# Patient Record
Sex: Male | Born: 1950
Health system: Southern US, Community
[De-identification: ages and names within clinical notes are randomized; demographics above are authoritative.]

## PROBLEM LIST (undated history)

## (undated) DIAGNOSIS — K2960 Other gastritis without bleeding: Secondary | ICD-10-CM

## (undated) DIAGNOSIS — F419 Anxiety disorder, unspecified: Secondary | ICD-10-CM

## (undated) DIAGNOSIS — F909 Attention-deficit hyperactivity disorder, unspecified type: Secondary | ICD-10-CM

## (undated) DIAGNOSIS — H9190 Unspecified hearing loss, unspecified ear: Secondary | ICD-10-CM

## (undated) DIAGNOSIS — I10 Essential (primary) hypertension: Secondary | ICD-10-CM

## (undated) DIAGNOSIS — Z8669 Personal history of other diseases of the nervous system and sense organs: Secondary | ICD-10-CM

## (undated) DIAGNOSIS — K219 Gastro-esophageal reflux disease without esophagitis: Secondary | ICD-10-CM

## (undated) DIAGNOSIS — N281 Cyst of kidney, acquired: Secondary | ICD-10-CM

## (undated) DIAGNOSIS — F32A Depression, unspecified: Secondary | ICD-10-CM

## (undated) DIAGNOSIS — M545 Low back pain, unspecified: Secondary | ICD-10-CM

## (undated) DIAGNOSIS — F329 Major depressive disorder, single episode, unspecified: Secondary | ICD-10-CM

## (undated) DIAGNOSIS — H409 Unspecified glaucoma: Secondary | ICD-10-CM

## (undated) DIAGNOSIS — N400 Enlarged prostate without lower urinary tract symptoms: Secondary | ICD-10-CM

## (undated) DIAGNOSIS — E785 Hyperlipidemia, unspecified: Secondary | ICD-10-CM

## (undated) DIAGNOSIS — G8929 Other chronic pain: Secondary | ICD-10-CM

## (undated) HISTORY — DX: Depression, unspecified: F32.A

## (undated) HISTORY — DX: Anxiety disorder, unspecified: F41.9

## (undated) HISTORY — DX: Cyst of kidney, acquired: N28.1

## (undated) HISTORY — DX: Gastro-esophageal reflux disease without esophagitis: K21.9

## (undated) HISTORY — DX: Low back pain, unspecified: M54.50

## (undated) HISTORY — DX: Unspecified glaucoma: H40.9

## (undated) HISTORY — DX: Hyperlipidemia, unspecified: E78.5

## (undated) HISTORY — DX: Essential (primary) hypertension: I10

## (undated) HISTORY — DX: Major depressive disorder, single episode, unspecified: F32.9

## (undated) HISTORY — PX: WISDOM TOOTH EXTRACTION: SHX21

## (undated) HISTORY — DX: Low back pain: M54.5

## (undated) HISTORY — DX: Other chronic pain: G89.29

## (undated) HISTORY — DX: Benign prostatic hyperplasia without lower urinary tract symptoms: N40.0

## (undated) HISTORY — DX: Attention-deficit hyperactivity disorder, unspecified type: F90.9

## (undated) HISTORY — DX: Personal history of other diseases of the nervous system and sense organs: Z86.69

---
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
## 1988-12-07 HISTORY — PX: HYDROCELE EXCISION: SHX482

## 2002-01-06 ENCOUNTER — Emergency Department (HOSPITAL_COMMUNITY): Admission: EM | Admit: 2002-01-06 | Discharge: 2002-01-06 | Payer: Self-pay | Admitting: Emergency Medicine

## 2002-01-08 ENCOUNTER — Emergency Department (HOSPITAL_COMMUNITY): Admission: EM | Admit: 2002-01-08 | Discharge: 2002-01-08 | Payer: Self-pay | Admitting: Emergency Medicine

## 2002-01-15 ENCOUNTER — Emergency Department (HOSPITAL_COMMUNITY): Admission: EM | Admit: 2002-01-15 | Discharge: 2002-01-15 | Payer: Self-pay | Admitting: Emergency Medicine

## 2002-02-02 ENCOUNTER — Ambulatory Visit (HOSPITAL_BASED_OUTPATIENT_CLINIC_OR_DEPARTMENT_OTHER): Admission: RE | Admit: 2002-02-02 | Discharge: 2002-02-02 | Payer: Self-pay | Admitting: Urology

## 2002-02-02 ENCOUNTER — Encounter (INDEPENDENT_AMBULATORY_CARE_PROVIDER_SITE_OTHER): Payer: Self-pay | Admitting: *Deleted

## 2002-10-19 ENCOUNTER — Emergency Department (HOSPITAL_COMMUNITY): Admission: EM | Admit: 2002-10-19 | Discharge: 2002-10-19 | Payer: Self-pay | Admitting: *Deleted

## 2004-11-04 ENCOUNTER — Ambulatory Visit: Payer: Self-pay | Admitting: Internal Medicine

## 2004-11-13 ENCOUNTER — Ambulatory Visit: Payer: Self-pay | Admitting: Internal Medicine

## 2004-12-03 ENCOUNTER — Ambulatory Visit: Payer: Self-pay | Admitting: Psychology

## 2004-12-16 ENCOUNTER — Ambulatory Visit: Payer: Self-pay | Admitting: Psychology

## 2005-01-15 ENCOUNTER — Ambulatory Visit: Payer: Self-pay | Admitting: Internal Medicine

## 2005-01-22 ENCOUNTER — Ambulatory Visit: Payer: Self-pay | Admitting: Internal Medicine

## 2005-03-23 ENCOUNTER — Ambulatory Visit: Payer: Self-pay | Admitting: Internal Medicine

## 2005-05-11 ENCOUNTER — Ambulatory Visit: Payer: Self-pay | Admitting: Internal Medicine

## 2005-07-09 ENCOUNTER — Ambulatory Visit: Payer: Self-pay | Admitting: Internal Medicine

## 2005-07-16 ENCOUNTER — Ambulatory Visit: Payer: Self-pay | Admitting: Internal Medicine

## 2005-08-03 ENCOUNTER — Ambulatory Visit: Payer: Self-pay | Admitting: Internal Medicine

## 2005-08-13 ENCOUNTER — Ambulatory Visit: Payer: Self-pay | Admitting: Internal Medicine

## 2005-08-20 ENCOUNTER — Ambulatory Visit: Payer: Self-pay | Admitting: Internal Medicine

## 2005-09-17 ENCOUNTER — Ambulatory Visit: Payer: Self-pay | Admitting: Internal Medicine

## 2005-11-17 ENCOUNTER — Ambulatory Visit: Payer: Self-pay | Admitting: Internal Medicine

## 2005-12-29 ENCOUNTER — Ambulatory Visit: Payer: Self-pay | Admitting: Internal Medicine

## 2006-01-29 ENCOUNTER — Ambulatory Visit: Payer: Self-pay | Admitting: Internal Medicine

## 2006-03-01 ENCOUNTER — Ambulatory Visit: Payer: Self-pay | Admitting: Internal Medicine

## 2006-04-01 ENCOUNTER — Ambulatory Visit: Payer: Self-pay | Admitting: Internal Medicine

## 2006-04-29 ENCOUNTER — Ambulatory Visit: Payer: Self-pay | Admitting: Internal Medicine

## 2006-07-05 ENCOUNTER — Ambulatory Visit: Payer: Self-pay | Admitting: Internal Medicine

## 2006-08-12 ENCOUNTER — Ambulatory Visit: Payer: Self-pay | Admitting: Internal Medicine

## 2006-08-17 ENCOUNTER — Ambulatory Visit: Payer: Self-pay | Admitting: Internal Medicine

## 2006-08-17 ENCOUNTER — Encounter: Admission: RE | Admit: 2006-08-17 | Discharge: 2006-08-17 | Payer: Self-pay | Admitting: Internal Medicine

## 2006-08-17 IMAGING — US US SCROTUM
1 series · 13 of 25 positions shown · non-contrast
Comparison: None.

CLINICAL DATA: Question testicular cyst.  History of left orchiectomy for hydrocele at 18 years old.  History of enlarged prostate with infection.  On pain medications.   Right hip, leg, pelvis, and testicular pain.  History of surgery to right scrotum with hydrocele removed.  
 SCROTAL ULTRASOUND:
TECHNIQUE: Complete ultrasound examination of the testicles, epididymis, and other scrotal structures was performed.

[Series 1: us scrotum · 0.08mm/px · 13 of 34 slices shown]
[im 1/34]
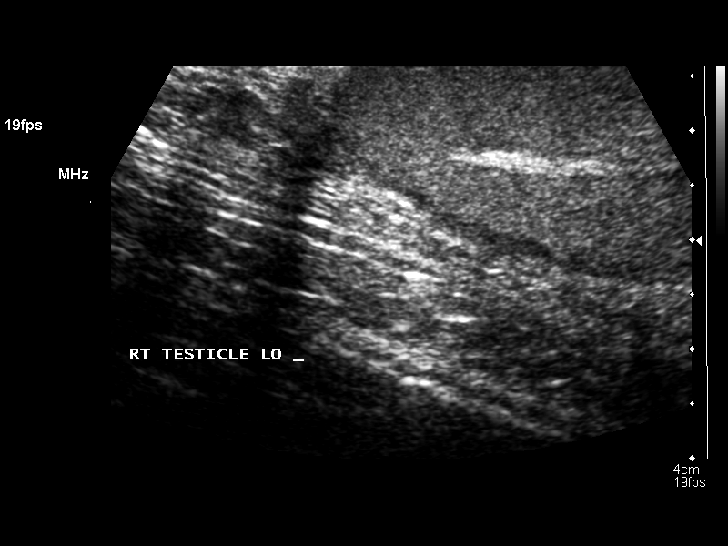
[im 3/34]
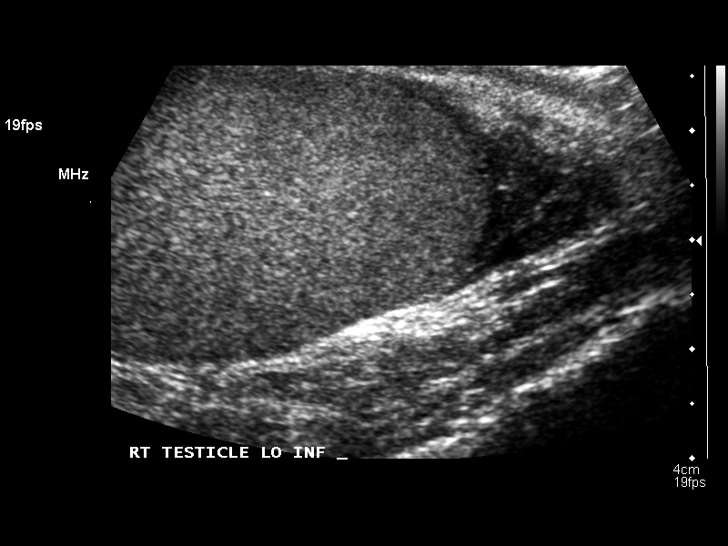
[im 6/34]
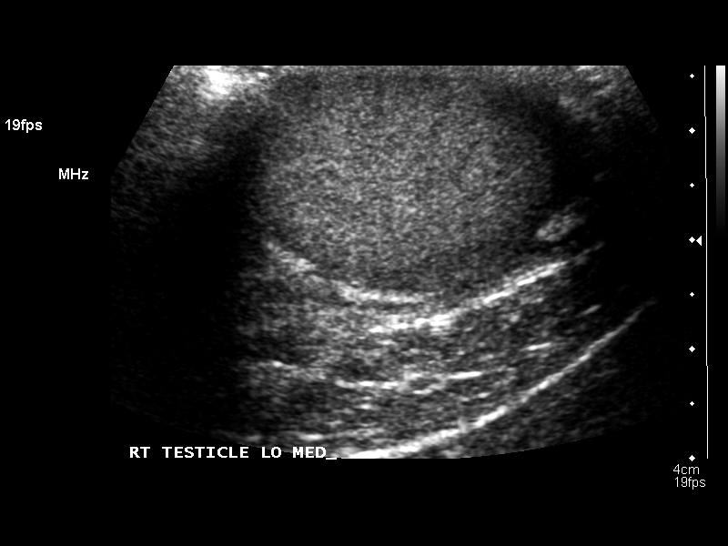
[im 9/34]
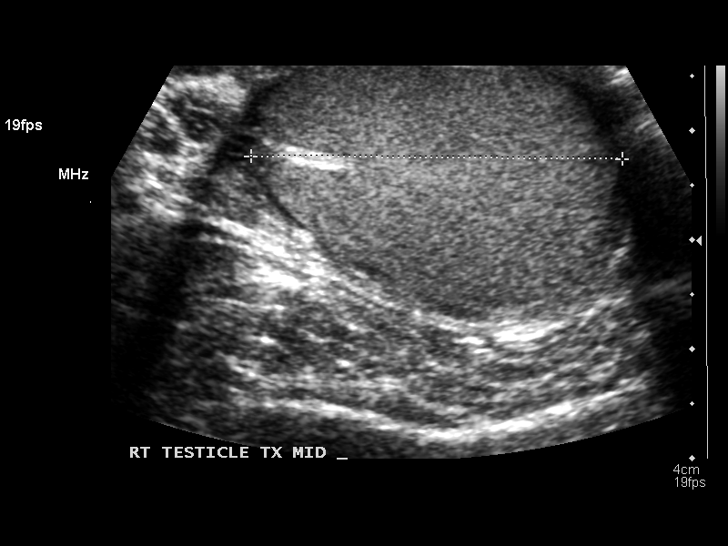
[im 12/34]
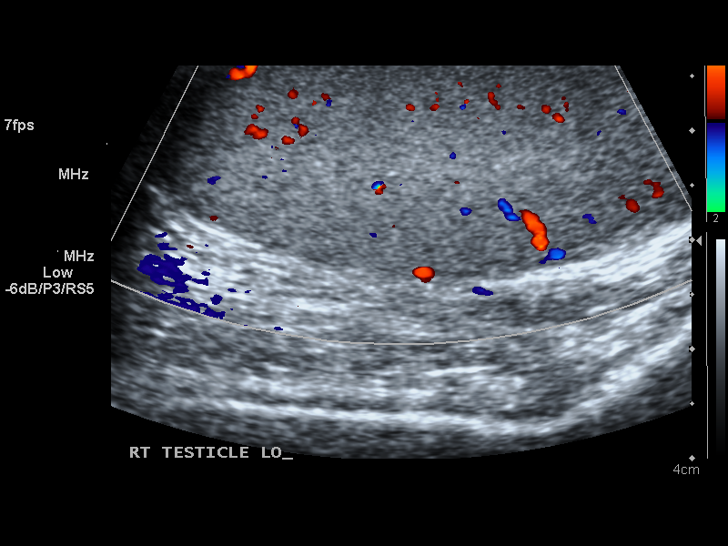
[im 14/34]
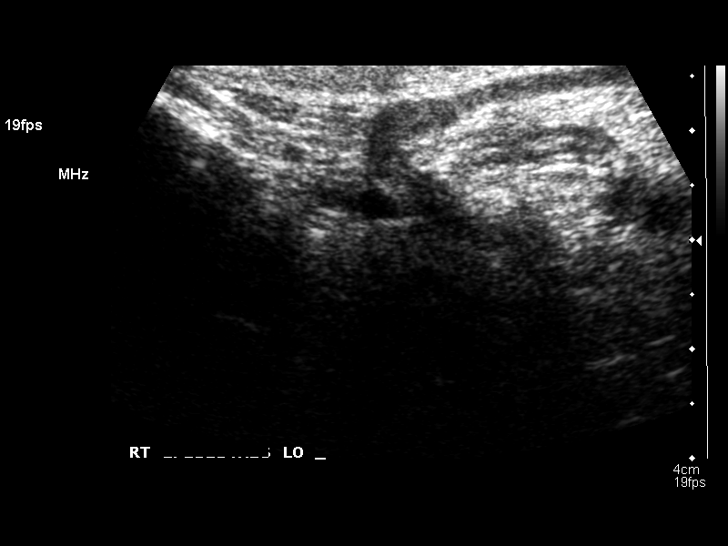
[im 17/34]
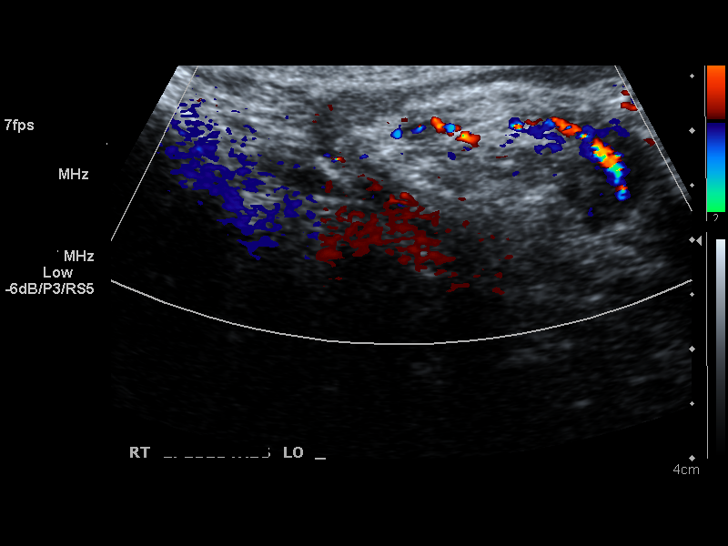
[im 20/34]
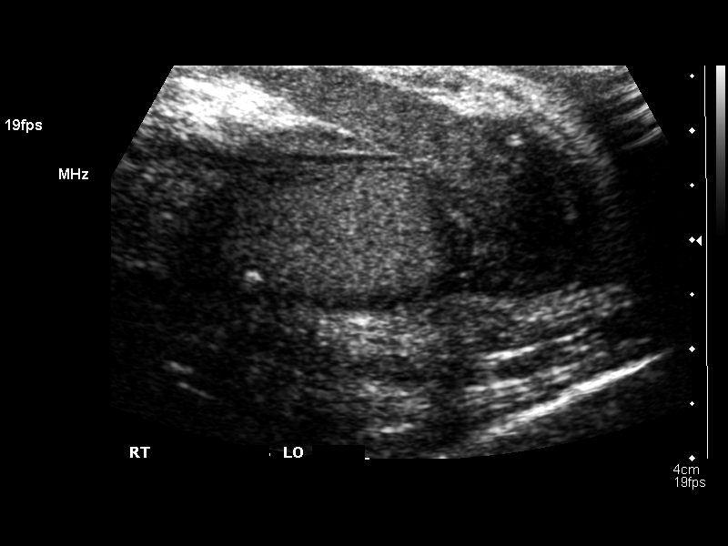
[im 23/34]
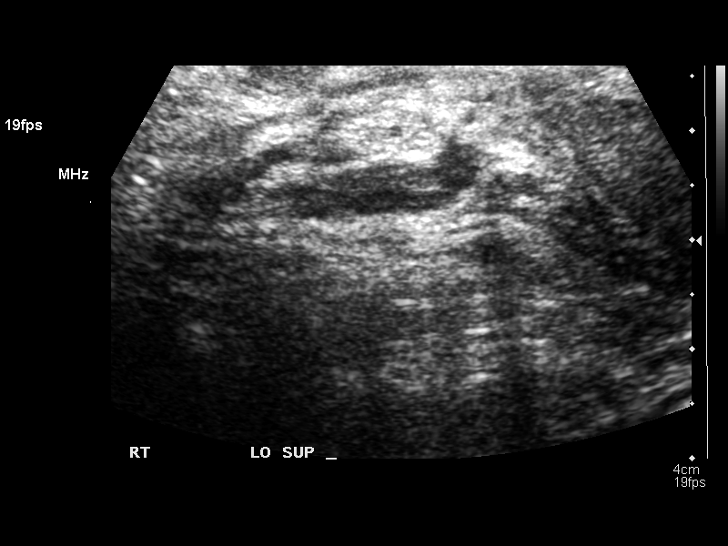
[im 25/34]
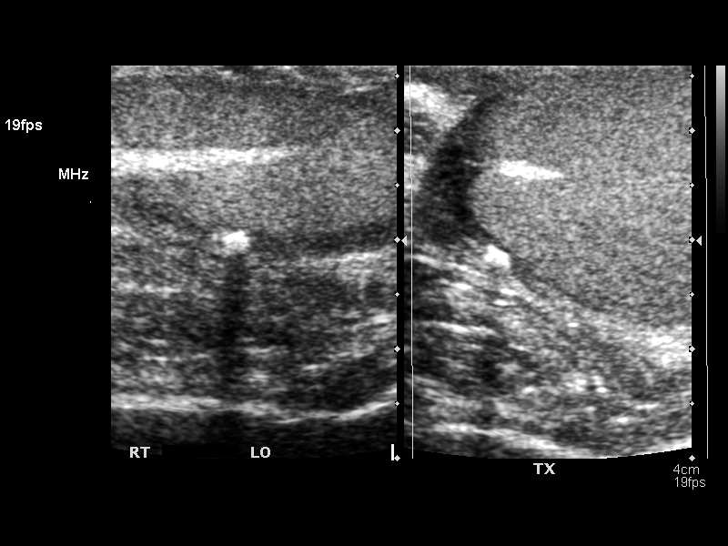
[im 28/34]
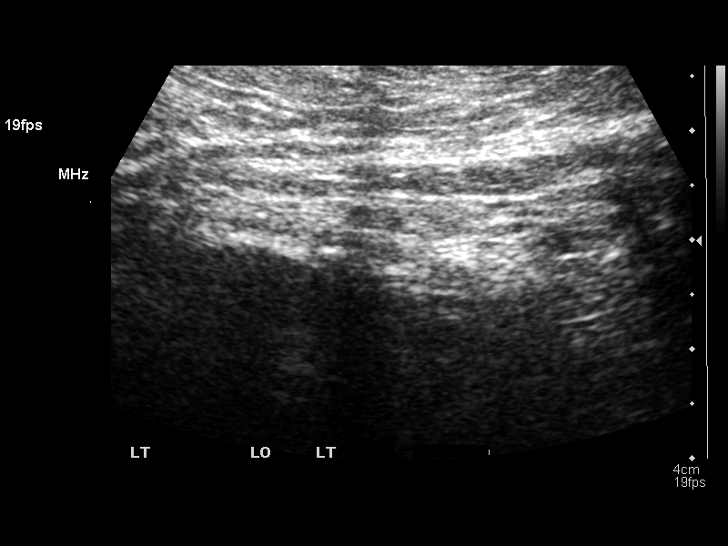
[im 31/34]
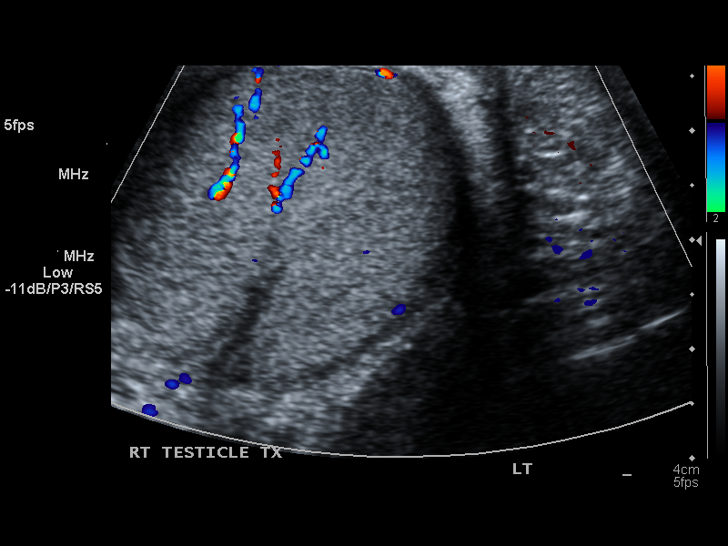
[im 34/34]
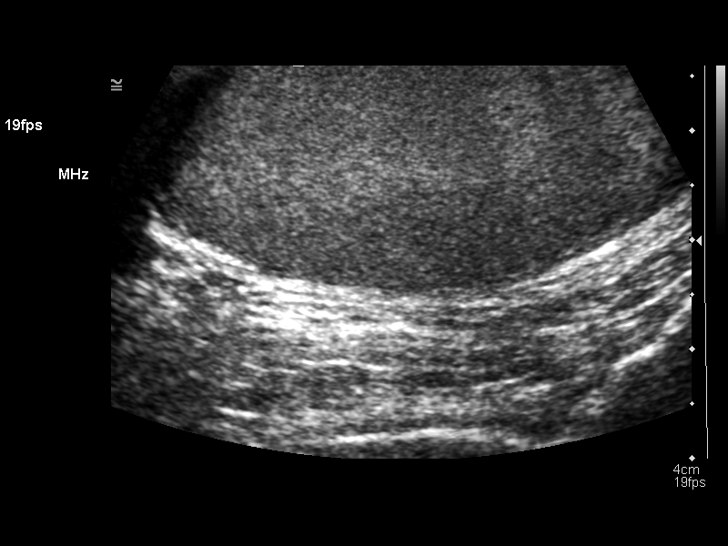

[13 of 25 positions shown; findings below may reference images not displayed]

FINDINGS: Patient is status-post left orchiectomy.  The right testicle measures 5.1 x 2.1 x 3.4 cm.  Homogeneous echotexture and normal vascularity to the right testis. 
 The right epididymis demonstrates mild fullness particularly in its tail region.  There is mild increased Doppler flow in this region.  There is possible right scrotal calcification which could relate to a ?scrotal pearl?.  
 No significant hydrocele. 
 In the distal aspect of the right inguinal canal, there is slight prominence of vessels, likely related to the spermatic cord.
IMPRESSION: 1.  Findings suspicious for mild right-sided epididymitis.  
 2.  Normal appearance of the right testis. 
 3.  Possible small right scrotal pearl. 
 4.  Status-post left orchiectomy.

## 2006-09-22 ENCOUNTER — Ambulatory Visit: Payer: Self-pay | Admitting: Internal Medicine

## 2006-10-25 ENCOUNTER — Ambulatory Visit: Payer: Self-pay | Admitting: Internal Medicine

## 2006-11-12 ENCOUNTER — Ambulatory Visit: Payer: Self-pay | Admitting: Internal Medicine

## 2006-12-29 ENCOUNTER — Ambulatory Visit: Payer: Self-pay | Admitting: Internal Medicine

## 2007-03-04 ENCOUNTER — Ambulatory Visit: Payer: Self-pay | Admitting: Internal Medicine

## 2007-05-13 ENCOUNTER — Ambulatory Visit: Payer: Self-pay | Admitting: Internal Medicine

## 2007-05-13 DIAGNOSIS — N401 Enlarged prostate with lower urinary tract symptoms: Secondary | ICD-10-CM | POA: Insufficient documentation

## 2007-05-13 DIAGNOSIS — I10 Essential (primary) hypertension: Secondary | ICD-10-CM | POA: Insufficient documentation

## 2007-05-13 DIAGNOSIS — E785 Hyperlipidemia, unspecified: Secondary | ICD-10-CM | POA: Insufficient documentation

## 2007-05-13 DIAGNOSIS — R351 Nocturia: Secondary | ICD-10-CM

## 2007-06-14 ENCOUNTER — Encounter: Payer: Self-pay | Admitting: Internal Medicine

## 2007-06-14 ENCOUNTER — Ambulatory Visit: Payer: Self-pay | Admitting: Internal Medicine

## 2007-06-14 DIAGNOSIS — K2970 Gastritis, unspecified, without bleeding: Secondary | ICD-10-CM | POA: Insufficient documentation

## 2007-06-14 DIAGNOSIS — F988 Other specified behavioral and emotional disorders with onset usually occurring in childhood and adolescence: Secondary | ICD-10-CM

## 2007-06-14 DIAGNOSIS — F329 Major depressive disorder, single episode, unspecified: Secondary | ICD-10-CM

## 2007-06-14 DIAGNOSIS — F32A Depression, unspecified: Secondary | ICD-10-CM | POA: Insufficient documentation

## 2007-06-14 DIAGNOSIS — K2990 Gastroduodenitis, unspecified, without bleeding: Secondary | ICD-10-CM

## 2007-06-14 DIAGNOSIS — M25569 Pain in unspecified knee: Secondary | ICD-10-CM

## 2007-06-18 ENCOUNTER — Encounter: Admission: RE | Admit: 2007-06-18 | Discharge: 2007-06-18 | Payer: Self-pay | Admitting: Internal Medicine

## 2007-06-23 ENCOUNTER — Telehealth: Payer: Self-pay | Admitting: *Deleted

## 2007-07-07 ENCOUNTER — Encounter: Payer: Self-pay | Admitting: Internal Medicine

## 2007-07-15 ENCOUNTER — Ambulatory Visit: Payer: Self-pay | Admitting: Internal Medicine

## 2007-07-15 DIAGNOSIS — M5137 Other intervertebral disc degeneration, lumbosacral region: Secondary | ICD-10-CM

## 2007-07-20 ENCOUNTER — Encounter: Payer: Self-pay | Admitting: Internal Medicine

## 2007-07-20 ENCOUNTER — Ambulatory Visit: Payer: Self-pay | Admitting: Family Medicine

## 2007-07-29 ENCOUNTER — Encounter: Payer: Self-pay | Admitting: Internal Medicine

## 2007-08-15 ENCOUNTER — Telehealth: Payer: Self-pay | Admitting: Internal Medicine

## 2007-08-31 ENCOUNTER — Telehealth (INDEPENDENT_AMBULATORY_CARE_PROVIDER_SITE_OTHER): Payer: Self-pay | Admitting: *Deleted

## 2007-09-07 HISTORY — PX: COLONOSCOPY: SHX174

## 2007-09-12 ENCOUNTER — Telehealth: Payer: Self-pay | Admitting: Internal Medicine

## 2007-09-16 ENCOUNTER — Ambulatory Visit: Payer: Self-pay | Admitting: Internal Medicine

## 2007-09-16 LAB — CONVERTED CEMR LAB
AST: 22 units/L (ref 0–37)
Bilirubin, Direct: 0.1 mg/dL (ref 0.0–0.3)
CO2: 30 meq/L (ref 19–32)
Chloride: 106 meq/L (ref 96–112)
Eosinophils Absolute: 0.1 10*3/uL (ref 0.0–0.6)
Eosinophils Relative: 1.2 % (ref 0.0–5.0)
GFR calc non Af Amer: 82 mL/min
Glucose, Bld: 103 mg/dL — ABNORMAL HIGH (ref 70–99)
HCT: 39.5 % (ref 39.0–52.0)
Lymphocytes Relative: 36.9 % (ref 12.0–46.0)
MCV: 87.9 fL (ref 78.0–100.0)
Neutro Abs: 2.7 10*3/uL (ref 1.4–7.7)
Neutrophils Relative %: 48.5 % (ref 43.0–77.0)
PSA: 1 ng/mL (ref 0.10–4.00)
RBC: 4.49 M/uL (ref 4.22–5.81)
Sodium: 141 meq/L (ref 135–145)
Specific Gravity, Urine: 1.025
Total Bilirubin: 0.6 mg/dL (ref 0.3–1.2)
Total Protein: 6 g/dL (ref 6.0–8.3)
WBC Urine, dipstick: NEGATIVE
WBC: 5.5 10*3/uL (ref 4.5–10.5)

## 2007-09-19 ENCOUNTER — Ambulatory Visit: Payer: Self-pay | Admitting: Gastroenterology

## 2007-09-23 ENCOUNTER — Ambulatory Visit: Payer: Self-pay | Admitting: Internal Medicine

## 2007-09-23 LAB — CONVERTED CEMR LAB: LDL Goal: 130 mg/dL

## 2007-09-30 ENCOUNTER — Encounter: Payer: Self-pay | Admitting: Gastroenterology

## 2007-09-30 ENCOUNTER — Encounter: Payer: Self-pay | Admitting: Internal Medicine

## 2007-09-30 ENCOUNTER — Ambulatory Visit: Payer: Self-pay | Admitting: Gastroenterology

## 2007-09-30 LAB — HM COLONOSCOPY: HM Colonoscopy: ABNORMAL

## 2007-10-03 ENCOUNTER — Telehealth: Payer: Self-pay | Admitting: *Deleted

## 2007-11-25 ENCOUNTER — Ambulatory Visit: Payer: Self-pay | Admitting: Internal Medicine

## 2008-01-03 ENCOUNTER — Telehealth: Payer: Self-pay | Admitting: Internal Medicine

## 2008-01-12 ENCOUNTER — Telehealth: Payer: Self-pay | Admitting: Internal Medicine

## 2008-03-07 ENCOUNTER — Ambulatory Visit: Payer: Self-pay | Admitting: Internal Medicine

## 2008-04-02 ENCOUNTER — Telehealth: Payer: Self-pay | Admitting: Internal Medicine

## 2008-06-06 ENCOUNTER — Ambulatory Visit: Payer: Self-pay | Admitting: Internal Medicine

## 2008-09-06 ENCOUNTER — Ambulatory Visit: Payer: Self-pay | Admitting: Internal Medicine

## 2008-09-06 DIAGNOSIS — M720 Palmar fascial fibromatosis [Dupuytren]: Secondary | ICD-10-CM | POA: Insufficient documentation

## 2008-12-12 ENCOUNTER — Ambulatory Visit: Payer: Self-pay | Admitting: Internal Medicine

## 2009-01-18 ENCOUNTER — Encounter: Payer: Self-pay | Admitting: Internal Medicine

## 2009-03-28 ENCOUNTER — Ambulatory Visit: Payer: Self-pay | Admitting: Internal Medicine

## 2009-06-25 ENCOUNTER — Ambulatory Visit: Payer: Self-pay | Admitting: Internal Medicine

## 2009-09-11 ENCOUNTER — Ambulatory Visit: Payer: Self-pay | Admitting: Internal Medicine

## 2009-09-11 ENCOUNTER — Encounter: Payer: Self-pay | Admitting: Internal Medicine

## 2009-09-24 ENCOUNTER — Ambulatory Visit: Payer: Self-pay | Admitting: Internal Medicine

## 2009-09-24 DIAGNOSIS — M899 Disorder of bone, unspecified: Secondary | ICD-10-CM | POA: Insufficient documentation

## 2009-09-24 DIAGNOSIS — M949 Disorder of cartilage, unspecified: Secondary | ICD-10-CM

## 2009-11-19 ENCOUNTER — Ambulatory Visit: Payer: Self-pay | Admitting: Internal Medicine

## 2009-11-19 DIAGNOSIS — R000 Tachycardia, unspecified: Secondary | ICD-10-CM

## 2009-11-19 DIAGNOSIS — R079 Chest pain, unspecified: Secondary | ICD-10-CM | POA: Insufficient documentation

## 2009-11-25 ENCOUNTER — Telehealth (INDEPENDENT_AMBULATORY_CARE_PROVIDER_SITE_OTHER): Payer: Self-pay | Admitting: *Deleted

## 2009-11-26 ENCOUNTER — Ambulatory Visit: Payer: Self-pay | Admitting: Cardiology

## 2009-11-26 ENCOUNTER — Ambulatory Visit: Payer: Self-pay

## 2009-11-26 ENCOUNTER — Encounter (HOSPITAL_COMMUNITY): Admission: RE | Admit: 2009-11-26 | Discharge: 2009-12-06 | Payer: Self-pay | Admitting: Internal Medicine

## 2009-11-27 ENCOUNTER — Encounter: Payer: Self-pay | Admitting: Cardiology

## 2009-12-10 ENCOUNTER — Ambulatory Visit: Payer: Self-pay | Admitting: Internal Medicine

## 2009-12-10 LAB — CONVERTED CEMR LAB: Vit D, 25-Hydroxy: 46 ng/mL (ref 30–89)

## 2009-12-20 ENCOUNTER — Ambulatory Visit: Payer: Self-pay | Admitting: Internal Medicine

## 2009-12-20 DIAGNOSIS — J0110 Acute frontal sinusitis, unspecified: Secondary | ICD-10-CM | POA: Insufficient documentation

## 2010-01-20 ENCOUNTER — Ambulatory Visit: Payer: Self-pay | Admitting: Internal Medicine

## 2010-02-17 ENCOUNTER — Ambulatory Visit: Payer: Self-pay | Admitting: Internal Medicine

## 2010-02-17 DIAGNOSIS — G44009 Cluster headache syndrome, unspecified, not intractable: Secondary | ICD-10-CM | POA: Insufficient documentation

## 2010-03-17 ENCOUNTER — Ambulatory Visit: Payer: Self-pay | Admitting: Internal Medicine

## 2010-05-19 ENCOUNTER — Telehealth: Payer: Self-pay | Admitting: Internal Medicine

## 2010-06-17 ENCOUNTER — Ambulatory Visit: Payer: Self-pay | Admitting: Internal Medicine

## 2010-09-29 LAB — CONVERTED CEMR LAB
Basophils Absolute: 0 10*3/uL
Cholesterol: 148 mg/dL
Creatinine, Ser: 0.96 mg/dL
Eosinophils Relative: 2 %
HCT: 43.3 %
HDL: 49 mg/dL
Hemoglobin: 15.4 g/dL
LDL Cholesterol: 71 mg/dL

## 2010-10-10 ENCOUNTER — Ambulatory Visit: Payer: Self-pay | Admitting: Internal Medicine

## 2010-11-05 ENCOUNTER — Telehealth: Payer: Self-pay | Admitting: *Deleted

## 2010-12-12 ENCOUNTER — Ambulatory Visit
Admission: RE | Admit: 2010-12-12 | Discharge: 2010-12-12 | Payer: Self-pay | Source: Home / Self Care | Attending: Internal Medicine | Admitting: Internal Medicine

## 2011-01-04 LAB — CONVERTED CEMR LAB
Calcium, Total (PTH): 9 mg/dL (ref 8.4–10.5)
Vit D, 25-Hydroxy: 28 ng/mL — ABNORMAL LOW (ref 30–89)

## 2011-01-08 NOTE — Assessment & Plan Note (Signed)
Summary: 1 month rov/njr/pt rescd//ccm   Vital Signs:  Patient profile:   60 year old male Height:      65 inches Weight:      168 pounds BMI:     28.06 Temp:     97.9 degrees F oral Pulse rate:   88 / minute Resp:     14 per minute BP sitting:   140 / 98  (left arm)  Vitals Entered By: Willy Eddy, LPN (December 20, 2009 12:01 PM) CC: roa labs annd f/u verapamil- bop still slightly elevated with elevated pulse-pt has chart, URI symptoms   CC:  roa labs annd f/u verapamil- bop still slightly elevated with elevated pulse-pt has chart and URI symptoms.  History of Present Illness: reveiw of blood pressure reading and pulses from report of the ambulatroy blood pressure  Hypertension Follow-Up      This is a 60 year old man who presents for Hypertension follow-up.  The patient denies lightheadedness, urinary frequency, headaches, edema, impotence, rash, and fatigue.  Associated symptoms include chest pressure.  The patient denies the following associated symptoms: chest pain, exercise intolerance, dyspnea, palpitations, syncope, leg edema, and pedal edema.  Compliance with medications (by patient report) has been near 100%.  The patient reports that dietary compliance has been good.  The patient reports exercising occasionally.  Adjunctive measures currently used by the patient include relaxation.    URI Symptoms      The patient also presents with URI symptoms.  The patient reports nasal congestion, purulent nasal discharge, and earache, but denies clear nasal discharge, sore throat, dry cough, productive cough, and sick contacts.  The patient denies fever, low-grade fever (<100.5 degrees), fever of 100.5-103 degrees, fever of 103.1-104 degrees, fever to >104 degrees, stiff neck, dyspnea, wheezing, rash, vomiting, diarrhea, use of an antipyretic, and response to antipyretic.  The patient also reports itchy watery eyes and headache.  The patient denies the following risk factors for Strep  sinusitis: unilateral facial pain, unilateral nasal discharge, poor response to decongestant, double sickening, tooth pain, Strep exposure, tender adenopathy, and absence of cough.    Preventive Screening-Counseling & Management  Alcohol-Tobacco     Smoking Status: never  Problems Prior to Update: 1)  Chest Pain, Intermittent  (ICD-786.50) 2)  Unspecified Tachycardia  (ICD-785.0) 3)  Osteopenia  (ICD-733.90) 4)  Dupuytren's Contracture, Right  (ICD-728.6) 5)  Degeneration, Lumbar/lumbosacral Disc  (ICD-722.52) 6)  Preventive Health Care  (ICD-V70.0) 7)  Attention Deficit Disorder, Adult  (ICD-314.00) 8)  Depression  (ICD-311) 9)  Joint Pain, Leg  (ICD-719.46) 10)  Gastritis  (ICD-535.50) 11)  Hypertension  (ICD-401.9) 12)  Benign Prostatic Hypertrophy  (ICD-600.00) 13)  Hyperlipidemia  (ICD-272.4)  Medications Prior to Update: 1)  Aspirin 81 Mg Tbec (Aspirin) .... Take 1 Tablet By Mouth Once A Day 2)  Finasteride 5 Mg Tabs (Finasteride) .... Once Daily 3)  Krill Oil 1000 Mg Caps (Krill Oil) .... Take 300 Once Daily 4)  Zinc 15 66 Mg  Tabs (Zinc Sulfate) .... Once Daily 5)  Lamotrigine 25 Mg Tbdp (Lamotrigine) .... 3 in The Am And2 in The Afternoon 6)  Magnesium 300 Mg Caps (Magnesium) .... Once Daily 7)  Nexium 40 Mg Cpdr (Esomeprazole Magnesium) .... Once Daily 8)  Niacin 500 Mg Tabs (Niacin) .... Take 1 Once A Day 9)  Uroxatral 10 Mg Tb24 (Alfuzosin Hcl) .... Take 1 Tablet By Mouth Once A Day 10)  Vytorin 10-20 Mg Tabs (Ezetimibe-Simvastatin) .... Once Daily 11)  Methylphenidate Hcl Cr 20 Mg Cr-Tabs (Methylphenidate Hcl) .... One By Mouth Daily 12)  Sertraline Hcl 100 Mg Tabs (Sertraline Hcl) .... One By Mouth Daily  ( Generic) 13)  Folic Acid 1 Mg Tabs (Folic Acid) .Marland Kitchen.. 1 Once Daily 14)  Magnesium 300 .Marland Kitchen.. 1 Once Daily 15)  Iron 325 (65 Fe) Mg Tabs (Ferrous Sulfate) .Marland Kitchen.. 1 Once Daily 16)  Glucosamine 500 Mg Caps (Glucosamine Sulfate) .... One By Mouth Daily 17)  Vitamin D  (Ergocalciferol) 50000 Unit Caps (Ergocalciferol) .Marland Kitchen.. 1 Every Week For 3 Months 18)  Verapamil Hcl Cr 120 Mg Xr24h-Cap (Verapamil Hcl) .... One By Mouth Daily  Current Medications (verified): 1)  Aspirin 81 Mg Tbec (Aspirin) .... Take 1 Tablet By Mouth Once A Day 2)  Finasteride 5 Mg Tabs (Finasteride) .... Once Daily 3)  Krill Oil 1000 Mg Caps (Krill Oil) .... Take 300 Once Daily 4)  Zinc 15 66 Mg  Tabs (Zinc Sulfate) .... Once Daily 5)  Lamotrigine 25 Mg Tbdp (Lamotrigine) .... 3 in The Am And2 in The Afternoon 6)  Magnesium 300 Mg Caps (Magnesium) .... Once Daily 7)  Nexium 40 Mg Cpdr (Esomeprazole Magnesium) .... Once Daily 8)  Niacin 500 Mg Tabs (Niacin) .... Take 1 Once A Day 9)  Uroxatral 10 Mg Tb24 (Alfuzosin Hcl) .... Take 1 Tablet By Mouth Once A Day 10)  Vytorin 10-20 Mg Tabs (Ezetimibe-Simvastatin) .... Once Daily 11)  Methylphenidate Hcl Cr 20 Mg Cr-Tabs (Methylphenidate Hcl) .... One By Mouth Daily 12)  Sertraline Hcl 100 Mg Tabs (Sertraline Hcl) .... One By Mouth Daily  ( Generic) 13)  Folic Acid 1 Mg Tabs (Folic Acid) .Marland Kitchen.. 1 Once Daily 14)  Magnesium 300 .Marland Kitchen.. 1 Once Daily 15)  Iron 325 (65 Fe) Mg Tabs (Ferrous Sulfate) .Marland Kitchen.. 1 Once Daily 16)  Glucosamine 500 Mg Caps (Glucosamine Sulfate) .... One By Mouth Daily 17)  Vitamin D (Ergocalciferol) 50000 Unit Caps (Ergocalciferol) .Marland Kitchen.. 1 Every Week For 3 Months 18)  Verapamil Hcl Cr 240 Mg Cr-Tabs (Verapamil Hcl) .... One By Mouth Daily 19)  Sulfamethoxazole-Tmp Ds 800-160 Mg Tabs (Sulfamethoxazole-Trimethoprim) .... One By Mouth Two Times A Day Wit Food  Allergies (verified): No Known Drug Allergies  Past History:  Family History: Last updated: 11/25/2007 Family Hsitory Headaches Family History High cholesterol  Social History: Last updated: 06/14/2007 Single Never Smoked Alcohol use-yes Drug use-no Regular exercise-yes  Risk Factors: Exercise: yes (06/14/2007)  Risk Factors: Smoking Status: never  (12/20/2009)  Past medical, surgical, family and social histories (including risk factors) reviewed, and no changes noted (except as noted below).  Past Medical History: Reviewed history from 09/06/2008 and no changes required. 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 Orchiectomy  1970 L Right testical  hydroceole  Family History: Reviewed history from 11/25/2007 and no changes required. Family Hsitory Headaches Family History High cholesterol  Social History: Reviewed history from 06/14/2007 and no changes required. Single Never Smoked Alcohol use-yes Drug use-no Regular exercise-yes  Physical Exam  General:  Well-developed,well-nourished,in no acute distress; alert,appropriate and cooperative throughout examination Head:  normocephalic and atraumatic.   Eyes:  pupils equal and pupils round.   Ears:  R TM erythema and R TM bulging.   Nose:  mucosal edema and airflow obstruction.   Mouth:  posterior lymphoid hypertrophy and postnasal drip.   Neck:  No deformities, masses, or tenderness noted. Lungs:  Normal respiratory effort, chest  expands symmetrically. Lungs are clear to auscultation, no crackles or wheezes. Heart:  Normal rate and regular rhythm. S1 and S2 normal without gallop, murmur, click, rub or other extra sounds. Psych:  Oriented X3 and not anxious appearing.     Impression & Recommendations:  Problem # 1:  HYPERTENSION (ICD-401.9) Assessment Unchanged  The following medications were removed from the medication list:    Verapamil Hcl Cr 120 Mg Xr24h-cap (Verapamil hcl) ..... One by mouth daily His updated medication list for this problem includes:    Verapamil Hcl Cr 240 Mg Cr-tabs (Verapamil hcl) ..... One by mouth daily  BP today: 140/98 Prior BP: 130/80 (11/19/2009)  Prior 10 Yr Risk Heart Disease: 7 %  (09/24/2009)  Labs Reviewed: K+: 4.0 (09/16/2007) Creat: : 1.0 (09/16/2007)   Chol: 134 (09/16/2007)   HDL: 36.0 (09/16/2007)   LDL: 79 (09/16/2007)   TG: 95 (09/16/2007)  Problem # 2:  ACUTE FRONTAL SINUSITIS (ICD-461.1) Assessment: New  with OM on the right side  Instructed on treatment. Call if symptoms persist or worsen.   His updated medication list for this problem includes:    Sulfamethoxazole-tmp Ds 800-160 Mg Tabs (Sulfamethoxazole-trimethoprim) ..... One by mouth two times a day wit food  Complete Medication List: 1)  Aspirin 81 Mg Tbec (Aspirin) .... Take 1 tablet by mouth once a day 2)  Finasteride 5 Mg Tabs (Finasteride) .... Once daily 3)  Krill Oil 1000 Mg Caps (Krill oil) .... Take 300 once daily 4)  Zinc 15 66 Mg Tabs (Zinc sulfate) .... Once daily 5)  Lamotrigine 25 Mg Tbdp (Lamotrigine) .... 3 in the am and2 in the afternoon 6)  Magnesium 300 Mg Caps (Magnesium) .... Once daily 7)  Nexium 40 Mg Cpdr (Esomeprazole magnesium) .... Once daily 8)  Niacin 500 Mg Tabs (Niacin) .... Take 1 once a day 9)  Uroxatral 10 Mg Tb24 (Alfuzosin hcl) .... Take 1 tablet by mouth once a day 10)  Vytorin 10-20 Mg Tabs (Ezetimibe-simvastatin) .... Once daily 11)  Methylphenidate Hcl Cr 20 Mg Cr-tabs (Methylphenidate hcl) .... One by mouth daily 12)  Sertraline Hcl 100 Mg Tabs (Sertraline hcl) .... One by mouth daily  ( generic) 13)  Folic Acid 1 Mg Tabs (Folic acid) .Marland Kitchen.. 1 once daily 14)  Magnesium 300  .Marland Kitchen.. 1 once daily 15)  Iron 325 (65 Fe) Mg Tabs (Ferrous sulfate) .Marland Kitchen.. 1 once daily 16)  Glucosamine 500 Mg Caps (Glucosamine sulfate) .... One by mouth daily 17)  Vitamin D (ergocalciferol) 50000 Unit Caps (Ergocalciferol) .Marland Kitchen.. 1 every week for 3 months 18)  Verapamil Hcl Cr 240 Mg Cr-tabs (Verapamil hcl) .... One by mouth daily 19)  Sulfamethoxazole-tmp Ds 800-160 Mg Tabs (Sulfamethoxazole-trimethoprim) .... One by mouth two times a day wit food  Patient Instructions: 1)  Please  schedule a follow-up appointment in 4 weeks. Prescriptions: SULFAMETHOXAZOLE-TMP DS 800-160 MG TABS (SULFAMETHOXAZOLE-TRIMETHOPRIM) one by mouth two times a day wit food  #20 x 0   Entered and Authorized by:   Stacie Glaze MD   Signed by:   Stacie Glaze MD on 12/20/2009   Method used:   Electronically to        [facility]* (retail)       [address]       [place], [place]  [id number]       Ph: [phone]       Fax: [phone]   RxID:   [id number] VERAPAMIL HCL CR 240 MG CR-TABS (VERAPAMIL  HCL) one by mouth daily  #30 x 11   Entered and Authorized by:   Stacie Glaze MD   Signed by:   Stacie Glaze MD on 12/20/2009   Method used:   Electronically to        [facility]* (retail)       [address]       [place], [place]  [id number]       Ph: [phone]       Fax: [phone]   RxID:   [id number]

## 2011-01-08 NOTE — Assessment & Plan Note (Signed)
Summary: 1 month rov/njr   Vital Signs:  Patient profile:   60 year old male Height:      65 inches Weight:      168 pounds BMI:     28.06 Temp:     98.6 degrees F oral Pulse rate:   72 / minute Resp:     14 per minute BP sitting:   120 / 80  (left arm)  Vitals Entered By: Willy Eddy, LPN (March 17, 2010 3:57 PM) CC: roa, Hypertension Management   CC:  roa and Hypertension Management.  History of Present Illness: moniter cluster head ache protocol has resumed the methylphenadate generic the meds were stable  Hypertension History:      He denies headache, chest pain, palpitations, dyspnea with exertion, orthopnea, PND, peripheral edema, visual symptoms, neurologic problems, syncope, and side effects from treatment.        Positive major cardiovascular risk factors include male age 89 years old or older, hyperlipidemia, and hypertension.  Negative major cardiovascular risk factors include no history of diabetes, negative family history for ischemic heart disease, and non-tobacco-user status.        Further assessment for target organ damage reveals no history of ASHD, cardiac end-organ damage (CHF/LVH), stroke/TIA, peripheral vascular disease, renal insufficiency, or hypertensive retinopathy.     Problems Prior to Update: 1)  Headache, Cluster  (ICD-339.00) 2)  Acute Frontal Sinusitis  (ICD-461.1) 3)  Chest Pain, Intermittent  (ICD-786.50) 4)  Unspecified Tachycardia  (ICD-785.0) 5)  Osteopenia  (ICD-733.90) 6)  Dupuytren's Contracture, Right  (ICD-728.6) 7)  Degeneration, Lumbar/lumbosacral Disc  (ICD-722.52) 8)  Preventive Health Care  (ICD-V70.0) 9)  Attention Deficit Disorder, Adult  (ICD-314.00) 10)  Depression  (ICD-311) 11)  Joint Pain, Leg  (ICD-719.46) 12)  Gastritis  (ICD-535.50) 13)  Hypertension  (ICD-401.9) 14)  Benign Prostatic Hypertrophy  (ICD-600.00) 15)  Hyperlipidemia  (ICD-272.4)  Current Problems (verified): 1)  Headache, Cluster   (ICD-339.00) 2)  Acute Frontal Sinusitis  (ICD-461.1) 3)  Chest Pain, Intermittent  (ICD-786.50) 4)  Unspecified Tachycardia  (ICD-785.0) 5)  Osteopenia  (ICD-733.90) 6)  Dupuytren's Contracture, Right  (ICD-728.6) 7)  Degeneration, Lumbar/lumbosacral Disc  (ICD-722.52) 8)  Preventive Health Care  (ICD-V70.0) 9)  Attention Deficit Disorder, Adult  (ICD-314.00) 10)  Depression  (ICD-311) 11)  Joint Pain, Leg  (ICD-719.46) 12)  Gastritis  (ICD-535.50) 13)  Hypertension  (ICD-401.9) 14)  Benign Prostatic Hypertrophy  (ICD-600.00) 15)  Hyperlipidemia  (ICD-272.4)  Medications Prior to Update: 1)  Aspirin 81 Mg Tbec (Aspirin) .... Take 1 Tablet By Mouth Once A Day 2)  Finasteride 5 Mg Tabs (Finasteride) .... Once Daily 3)  Krill Oil 1000 Mg Caps (Krill Oil) .... Take 300 Once Daily 4)  Zinc 15 66 Mg  Tabs (Zinc Sulfate) .... Once Daily 5)  Lamotrigine 25 Mg Tbdp (Lamotrigine) .... 3 in The Am And2 in The Afternoon 6)  Magnesium 300 Mg Caps (Magnesium) .... Once Daily 7)  Nexium 40 Mg Cpdr (Esomeprazole Magnesium) .... Once Daily 8)  Niacin 500 Mg Tabs (Niacin) .... Take 1 Once A Day 9)  Uroxatral 10 Mg Tb24 (Alfuzosin Hcl) .... Take 1 Tablet By Mouth Once A Day 10)  Vytorin 10-20 Mg Tabs (Ezetimibe-Simvastatin) .... Once Daily 11)  Methylphenidate Hcl Cr 20 Mg Cr-Tabs (Methylphenidate Hcl) .... One By Mouth Daily 12)  Sertraline Hcl 100 Mg Tabs (Sertraline Hcl) .... One By Mouth Daily  ( Generic) 13)  Folic Acid 1 Mg Tabs (Folic Acid) .Marland KitchenMarland KitchenMarland Kitchen  1 Once Daily 14)  Magnesium 300 .Marland Kitchen.. 1 Once Daily 15)  Iron 325 (65 Fe) Mg Tabs (Ferrous Sulfate) .Marland Kitchen.. 1 Once Daily 16)  Glucosamine 500 Mg Caps (Glucosamine Sulfate) .... One By Mouth Daily 17)  Vitamin D (Ergocalciferol) 50000 Unit Caps (Ergocalciferol) .Marland Kitchen.. 1 Every Week For 3 Months 18)  Verapamil Hcl Cr 240 Mg Cr-Tabs (Verapamil Hcl) .... One By Mouth Daily 19)  Hydrochlorothiazide 12.5 Mg Caps (Hydrochlorothiazide) .... One By Mouth Daily 20)   Doxycycline Hyclate 100 Mg Caps (Doxycycline Hyclate) .... One By Mouth Two Times A Day For 30 Days 21)  Astepro 0.15 % Soln (Azelastine Hcl) .... Two Spray in Nostril in Am  Current Medications (verified): 1)  Aspirin 81 Mg Tbec (Aspirin) .... Take 1 Tablet By Mouth Once A Day 2)  Finasteride 5 Mg Tabs (Finasteride) .... Once Daily 3)  Krill Oil 1000 Mg Caps (Krill Oil) .... Take 300 Once Daily 4)  Zinc 15 66 Mg  Tabs (Zinc Sulfate) .... Once Daily 5)  Lamotrigine 25 Mg Tbdp (Lamotrigine) .... 3 in The Am And2 in The Afternoon 6)  Magnesium 300 Mg Caps (Magnesium) .... Once Daily 7)  Nexium 40 Mg Cpdr (Esomeprazole Magnesium) .... Once Daily 8)  Niacin 500 Mg Tabs (Niacin) .... Take 1 Once A Day 9)  Uroxatral 10 Mg Tb24 (Alfuzosin Hcl) .... Take 1 Tablet By Mouth Once A Day 10)  Vytorin 10-20 Mg Tabs (Ezetimibe-Simvastatin) .... Once Daily 11)  Methylphenidate Hcl Cr 20 Mg Cr-Tabs (Methylphenidate Hcl) .... One By Mouth Daily 12)  Sertraline Hcl 100 Mg Tabs (Sertraline Hcl) .... One By Mouth Daily  ( Generic) 13)  Folic Acid 1 Mg Tabs (Folic Acid) .Marland Kitchen.. 1 Once Daily 14)  Magnesium 300 .Marland Kitchen.. 1 Once Daily 15)  Iron 325 (65 Fe) Mg Tabs (Ferrous Sulfate) .Marland Kitchen.. 1 Once Daily 16)  Glucosamine 500 Mg Caps (Glucosamine Sulfate) .... One By Mouth Daily 17)  Vitamin D (Ergocalciferol) 50000 Unit Caps (Ergocalciferol) .Marland Kitchen.. 1 Every Week For 3 Months 18)  Verapamil Hcl Cr 240 Mg Cr-Tabs (Verapamil Hcl) .... One By Mouth Daily 19)  Hydrochlorothiazide 12.5 Mg Caps (Hydrochlorothiazide) .... One By Mouth Daily 20)  Astepro 0.15 % Soln (Azelastine Hcl) .... Two Spray in Nostril in Am  Allergies (verified): No Known Drug Allergies  Past History:  Family History: Last updated: 11/25/2007 Family Hsitory Headaches Family History High cholesterol  Social History: Last updated: 06/14/2007 Single Never Smoked Alcohol use-yes Drug use-no Regular exercise-yes  Risk Factors: Exercise: yes  (06/14/2007)  Risk Factors: Smoking Status: never (02/17/2010)  Past medical, surgical, family and social histories (including risk factors) reviewed, and no changes noted (except as noted below).  Past Medical History: Reviewed history from 09/06/2008 and no changes required. 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 Orchiectomy  1970 L Right testical  hydroceole  Family History: Reviewed history from 11/25/2007 and no changes required. Family Hsitory Headaches Family History High cholesterol  Social History: Reviewed history from 06/14/2007 and no changes required. Single Never Smoked Alcohol use-yes Drug use-no Regular exercise-yes  Review of Systems  The patient denies anorexia, fever, weight loss, weight gain, vision loss, decreased hearing, hoarseness, chest pain, syncope, dyspnea on exertion, peripheral edema, prolonged cough, headaches, hemoptysis, abdominal pain, melena, hematochezia, severe indigestion/heartburn, hematuria, incontinence, genital sores, muscle weakness, suspicious skin lesions, transient blindness, difficulty walking, depression, unusual weight change, abnormal bleeding, enlarged lymph nodes,  angioedema, and breast masses.    Physical Exam  Head:  normocephalic and atraumatic.   Eyes:  pupils equal and pupils round.   Ears:  R TM erythema and R TM bulging.   Nose:  mucosal edema and airflow obstruction.   Mouth:  posterior lymphoid hypertrophy and postnasal drip.   Neck:  No deformities, masses, or tenderness noted. Lungs:  Normal respiratory effort, chest expands symmetrically. Lungs are clear to auscultation, no crackles or wheezes. Heart:  Normal rate and regular rhythm. S1 and S2 normal without gallop, murmur, click, rub or other extra sounds. Abdomen:  soft and no distention.     Impression &  Recommendations:  Problem # 1:  HEADACHE, CLUSTER (ICD-339.00)  Problem # 2:  ATTENTION DEFICIT DISORDER, ADULT (ICD-314.00)  Problem # 3:  HYPERTENSION (ICD-401.9)  His updated medication list for this problem includes:    Verapamil Hcl Cr 240 Mg Cr-tabs (Verapamil hcl) ..... One by mouth daily    Hydrochlorothiazide 12.5 Mg Caps (Hydrochlorothiazide) ..... One by mouth daily  BP today: 120/80 Prior BP: 132/80 (02/17/2010)  10 Yr Risk Heart Disease: 6 % Prior 10 Yr Risk Heart Disease: 7 % (02/17/2010)  Labs Reviewed: K+: 4.0 (09/16/2007) Creat: : 1.0 (09/16/2007)   Chol: 134 (09/16/2007)   HDL: 36.0 (09/16/2007)   LDL: 79 (09/16/2007)   TG: 95 (09/16/2007)  Problem # 4:  DEPRESSION (ICD-311)  His updated medication list for this problem includes:    Sertraline Hcl 100 Mg Tabs (Sertraline hcl) ..... One by mouth daily  ( generic) and lamictal  Discussed treatment options, including trial of antidpressant medication. Will refer to behavioral health. Follow-up call in in 24-48 hours and recheck in 2 weeks, sooner as needed. Patient agrees to call if any worsening of symptoms or thoughts of doing harm arise. Verified that the patient has no suicidal ideation at this time.   Complete Medication List: 1)  Aspirin 81 Mg Tbec (Aspirin) .... Take 1 tablet by mouth once a day 2)  Finasteride 5 Mg Tabs (Finasteride) .... Once daily 3)  Krill Oil 1000 Mg Caps (Krill oil) .... Take 300 once daily 4)  Zinc 15 66 Mg Tabs (Zinc sulfate) .... Once daily 5)  Lamotrigine 25 Mg Tbdp (Lamotrigine) .... 3 in the am and2 in the afternoon 6)  Magnesium 300 Mg Caps (Magnesium) .... Once daily 7)  Nexium 40 Mg Cpdr (Esomeprazole magnesium) .... Once daily 8)  Niacin 500 Mg Tabs (Niacin) .... Take 1 once a day 9)  Uroxatral 10 Mg Tb24 (Alfuzosin hcl) .... Take 1 tablet by mouth once a day 10)  Vytorin 10-20 Mg Tabs (Ezetimibe-simvastatin) .... Once daily 11)  Methylphenidate Hcl Cr 20 Mg Cr-tabs  (Methylphenidate hcl) .... One by mouth daily 12)  Sertraline Hcl 100 Mg Tabs (Sertraline hcl) .... One by mouth daily  ( generic) 13)  Folic Acid 1 Mg Tabs (Folic acid) .Marland Kitchen.. 1 once daily 14)  Magnesium 300  .Marland Kitchen.. 1 once daily 15)  Iron 325 (65 Fe) Mg Tabs (Ferrous sulfate) .Marland Kitchen.. 1 once daily 16)  Glucosamine 500 Mg Caps (Glucosamine sulfate) .... One by mouth daily 17)  Vitamin D (ergocalciferol) 50000 Unit Caps (Ergocalciferol) .Marland Kitchen.. 1 every week for 3 months 18)  Verapamil Hcl Cr 240 Mg Cr-tabs (Verapamil hcl) .... One by mouth daily 19)  Hydrochlorothiazide 12.5 Mg Caps (Hydrochlorothiazide) .... One by mouth daily 20)  Astepro 0.15 % Soln (Azelastine hcl) .... Two spray in nostril in am  Hypertension Assessment/Plan:  The patient's hypertensive risk group is category B: At least one risk factor (excluding diabetes) with no target organ damage.  His calculated 10 year risk of coronary heart disease is 6 %.  Today's blood pressure is 120/80.  His blood pressure goal is < 140/90.  Patient Instructions: 1)  Please schedule a follow-up appointment in 3 months. Prescriptions: METHYLPHENIDATE HCL CR 20 MG CR-TABS (METHYLPHENIDATE HCL) one by mouth daily  #30 x 0   Entered and Authorized by:   Stacie Glaze MD   Signed by:   Stacie Glaze MD on 03/17/2010   Method used:   Print then Give to Patient   RxID:   [id number] METHYLPHENIDATE HCL CR 20 MG CR-TABS (METHYLPHENIDATE HCL) one by mouth daily  #90 x 0   Entered and Authorized by:   Stacie Glaze MD   Signed by:   Stacie Glaze MD on 03/17/2010   Method used:   Print then Give to Patient   RxID:   [id number] VERAPAMIL HCL CR 240 MG CR-TABS (VERAPAMIL HCL) one by mouth daily  #90 x 3   Entered and Authorized by:   Stacie Glaze MD   Signed by:   Stacie Glaze MD on 03/17/2010   Method used:   Faxed to ...       [facility]--mail order pharmacy (mail-order)             , [place]         Ph: [phone]       Fax:  [phone]   RxID:   [id number]

## 2011-01-08 NOTE — Progress Notes (Signed)
  Phone Note Call from Patient   Reason for Call: Acute Illness, Referral Summary of Call: Generic Ritalin is no longer available anywhere.  What do you suggest ?  Initial call taken by: Willy Eddy, LPN,  May 19, 2010 5:25 PM  Follow-up for Phone Call        change to vyvnase 40 mg a day Follow-up by: Stacie Glaze MD,  May 20, 2010 10:13 AM    New/Updated Medications: VYVANSE 40 MG CAPS (LISDEXAMFETAMINE DIMESYLATE) one by mouth daily Prescriptions: VYVANSE 40 MG CAPS (LISDEXAMFETAMINE DIMESYLATE) one by mouth daily  #30 x 0   Entered and Authorized by:   Stacie Glaze MD   Signed by:   Stacie Glaze MD on 05/20/2010   Method used:   Print then Give to Patient   RxID:   [id number]

## 2011-01-08 NOTE — Assessment & Plan Note (Signed)
Summary: rov/bmw   Vital Signs:  Patient profile:   60 year old male Height:      65 inches Weight:      164 pounds BMI:     27.39 Temp:     98.7 degrees F oral Pulse rate:   84 / minute Resp:     14 per minute BP sitting:   140 / 84  (left arm)  Vitals Entered By: Willy Eddy, LPN (January 20, 2010 4:17 PM) CC: foa/f/u sore throat, Hypertension Management   CC:  foa/f/u sore throat and Hypertension Management.  Hypertension History:      He denies headache, chest pain, palpitations, dyspnea with exertion, orthopnea, PND, peripheral edema, visual symptoms, neurologic problems, syncope, and side effects from treatment.  folow up of change in medication off the adhd meds.        Positive major cardiovascular risk factors include male age 52 years old or older, hyperlipidemia, and hypertension.  Negative major cardiovascular risk factors include no history of diabetes, negative family history for ischemic heart disease, and non-tobacco-user status.        Further assessment for target organ damage reveals no history of ASHD, cardiac end-organ damage (CHF/LVH), stroke/TIA, peripheral vascular disease, renal insufficiency, or hypertensive retinopathy.     Preventive Screening-Counseling & Management  Alcohol-Tobacco     Smoking Status: never  Problems Prior to Update: 1)  Acute Frontal Sinusitis  (ICD-461.1) 2)  Chest Pain, Intermittent  (ICD-786.50) 3)  Unspecified Tachycardia  (ICD-785.0) 4)  Osteopenia  (ICD-733.90) 5)  Dupuytren's Contracture, Right  (ICD-728.6) 6)  Degeneration, Lumbar/lumbosacral Disc  (ICD-722.52) 7)  Preventive Health Care  (ICD-V70.0) 8)  Attention Deficit Disorder, Adult  (ICD-314.00) 9)  Depression  (ICD-311) 10)  Joint Pain, Leg  (ICD-719.46) 11)  Gastritis  (ICD-535.50) 12)  Hypertension  (ICD-401.9) 13)  Benign Prostatic Hypertrophy  (ICD-600.00) 14)  Hyperlipidemia  (ICD-272.4)  Current Problems (verified): 1)  Acute Frontal Sinusitis   (ICD-461.1) 2)  Chest Pain, Intermittent  (ICD-786.50) 3)  Unspecified Tachycardia  (ICD-785.0) 4)  Osteopenia  (ICD-733.90) 5)  Dupuytren's Contracture, Right  (ICD-728.6) 6)  Degeneration, Lumbar/lumbosacral Disc  (ICD-722.52) 7)  Preventive Health Care  (ICD-V70.0) 8)  Attention Deficit Disorder, Adult  (ICD-314.00) 9)  Depression  (ICD-311) 10)  Joint Pain, Leg  (ICD-719.46) 11)  Gastritis  (ICD-535.50) 12)  Hypertension  (ICD-401.9) 13)  Benign Prostatic Hypertrophy  (ICD-600.00) 14)  Hyperlipidemia  (ICD-272.4)  Medications Prior to Update: 1)  Aspirin 81 Mg Tbec (Aspirin) .... Take 1 Tablet By Mouth Once A Day 2)  Finasteride 5 Mg Tabs (Finasteride) .... Once Daily 3)  Krill Oil 1000 Mg Caps (Krill Oil) .... Take 300 Once Daily 4)  Zinc 15 66 Mg  Tabs (Zinc Sulfate) .... Once Daily 5)  Lamotrigine 25 Mg Tbdp (Lamotrigine) .... 3 in The Am And2 in The Afternoon 6)  Magnesium 300 Mg Caps (Magnesium) .... Once Daily 7)  Nexium 40 Mg Cpdr (Esomeprazole Magnesium) .... Once Daily 8)  Niacin 500 Mg Tabs (Niacin) .... Take 1 Once A Day 9)  Uroxatral 10 Mg Tb24 (Alfuzosin Hcl) .... Take 1 Tablet By Mouth Once A Day 10)  Vytorin 10-20 Mg Tabs (Ezetimibe-Simvastatin) .... Once Daily 11)  Methylphenidate Hcl Cr 20 Mg Cr-Tabs (Methylphenidate Hcl) .... One By Mouth Daily 12)  Sertraline Hcl 100 Mg Tabs (Sertraline Hcl) .... One By Mouth Daily  ( Generic) 13)  Folic Acid 1 Mg Tabs (Folic Acid) .Marland Kitchen.. 1 Once Daily  14)  Magnesium 300 .Marland Kitchen.. 1 Once Daily 15)  Iron 325 (65 Fe) Mg Tabs (Ferrous Sulfate) .Marland Kitchen.. 1 Once Daily 16)  Glucosamine 500 Mg Caps (Glucosamine Sulfate) .... One By Mouth Daily 17)  Vitamin D (Ergocalciferol) 50000 Unit Caps (Ergocalciferol) .Marland Kitchen.. 1 Every Week For 3 Months 18)  Verapamil Hcl Cr 240 Mg Cr-Tabs (Verapamil Hcl) .... One By Mouth Daily 19)  Sulfamethoxazole-Tmp Ds 800-160 Mg Tabs (Sulfamethoxazole-Trimethoprim) .... One By Mouth Two Times A Day Wit Food  Current  Medications (verified): 1)  Aspirin 81 Mg Tbec (Aspirin) .... Take 1 Tablet By Mouth Once A Day 2)  Finasteride 5 Mg Tabs (Finasteride) .... Once Daily 3)  Krill Oil 1000 Mg Caps (Krill Oil) .... Take 300 Once Daily 4)  Zinc 15 66 Mg  Tabs (Zinc Sulfate) .... Once Daily 5)  Lamotrigine 25 Mg Tbdp (Lamotrigine) .... 3 in The Am And2 in The Afternoon 6)  Magnesium 300 Mg Caps (Magnesium) .... Once Daily 7)  Nexium 40 Mg Cpdr (Esomeprazole Magnesium) .... Once Daily 8)  Niacin 500 Mg Tabs (Niacin) .... Take 1 Once A Day 9)  Uroxatral 10 Mg Tb24 (Alfuzosin Hcl) .... Take 1 Tablet By Mouth Once A Day 10)  Vytorin 10-20 Mg Tabs (Ezetimibe-Simvastatin) .... Once Daily 11)  Methylphenidate Hcl Cr 20 Mg Cr-Tabs (Methylphenidate Hcl) .... One By Mouth Daily 12)  Sertraline Hcl 100 Mg Tabs (Sertraline Hcl) .... One By Mouth Daily  ( Generic) 13)  Folic Acid 1 Mg Tabs (Folic Acid) .Marland Kitchen.. 1 Once Daily 14)  Magnesium 300 .Marland Kitchen.. 1 Once Daily 15)  Iron 325 (65 Fe) Mg Tabs (Ferrous Sulfate) .Marland Kitchen.. 1 Once Daily 16)  Glucosamine 500 Mg Caps (Glucosamine Sulfate) .... One By Mouth Daily 17)  Vitamin D (Ergocalciferol) 50000 Unit Caps (Ergocalciferol) .Marland Kitchen.. 1 Every Week For 3 Months 18)  Verapamil Hcl Cr 240 Mg Cr-Tabs (Verapamil Hcl) .... One By Mouth Daily 19)  Hydrochlorothiazide 12.5 Mg Caps (Hydrochlorothiazide) .... One By Mouth Daily 20)  Azithromycin 250 Mg Tabs (Azithromycin) .... Two By Mouth Now and  Then One By Mouth Daily For 4 Days 21)  Bromfed Dm 30-2-10 Mg/80ml Syrp (Pseudoeph-Bromphen-Dm) .... Two Tsp By Mouth Every 6 Hpours As Needed May Sub For Similar Cough Med  Allergies (verified): No Known Drug Allergies  Past History:  Past Medical History: Last updated: 09/06/2008 Hyperlipidemia ADHD Benign prostatic hypertrophy Hypertension Depression contracture to finger  Past Surgical History: Last updated: 07/15/2007 Orchiectomy  1970 L Right testical  hydroceole  Family History: Last  updated: 11/25/2007 Family Hsitory Headaches Family History High cholesterol  Social History: Last updated: 06/14/2007 Single Never Smoked Alcohol use-yes Drug use-no Regular exercise-yes  Risk Factors: Exercise: yes (06/14/2007)  Risk Factors: Smoking Status: never (01/20/2010)  Family History: Reviewed history from 11/25/2007 and no changes required. Family Hsitory Headaches Family History High cholesterol  Social History: Reviewed history from 06/14/2007 and no changes required. Single Never Smoked Alcohol use-yes Drug use-no Regular exercise-yes  Review of Systems  The patient denies anorexia, fever, weight loss, weight gain, vision loss, decreased hearing, hoarseness, chest pain, syncope, dyspnea on exertion, peripheral edema, prolonged cough, headaches, hemoptysis, abdominal pain, melena, hematochezia, severe indigestion/heartburn, hematuria, incontinence, genital sores, muscle weakness, suspicious skin lesions, transient blindness, difficulty walking, depression, unusual weight change, abnormal bleeding, enlarged lymph nodes, angioedema, breast masses, and testicular masses.    Physical Exam  General:  Well-developed,well-nourished,in no acute distress; alert,appropriate and cooperative throughout examination Head:  normocephalic and atraumatic.   Eyes:  pupils equal and pupils round.   Ears:  R TM erythema and R TM bulging.   Nose:  mucosal edema and airflow obstruction.   Mouth:  posterior lymphoid hypertrophy and postnasal drip.   Neck:  No deformities, masses, or tenderness noted. Lungs:  Normal respiratory effort, chest expands symmetrically. Lungs are clear to auscultation, no crackles or wheezes. Abdomen:  soft and no distention.   Msk:  normal ROM, no joint tenderness, and no joint swelling.   Extremities:  trace left pedal edema and trace right pedal edema.   Neurologic:  alert & oriented X3 and gait normal.     Impression &  Recommendations:  Problem # 1:  HYPERTENSION (ICD-401.9)  His updated medication list for this problem includes:    Verapamil Hcl Cr 240 Mg Cr-tabs (Verapamil hcl) ..... One by mouth daily    Hydrochlorothiazide 12.5 Mg Caps (Hydrochlorothiazide) ..... One by mouth daily  BP today: 140/84 Prior BP: 140/98 (12/20/2009)  Prior 10 Yr Risk Heart Disease: 7 % (09/24/2009)  Labs Reviewed: K+: 4.0 (09/16/2007) Creat: : 1.0 (09/16/2007)   Chol: 134 (09/16/2007)   HDL: 36.0 (09/16/2007)   LDL: 79 (09/16/2007)   TG: 95 (09/16/2007)  Problem # 2:  UNSPECIFIED TACHYCARDIA (ICD-785.0) improved  Problem # 3:  ATTENTION DEFICIT DISORDER, ADULT (ICD-314.00) refill medications  Problem # 4:  ACUTE FRONTAL SINUSITIS (ICD-461.1)  The following medications were removed from the medication list:    Sulfamethoxazole-tmp Ds 800-160 Mg Tabs (Sulfamethoxazole-trimethoprim) ..... One by mouth two times a day wit food His updated medication list for this problem includes:    Azithromycin 250 Mg Tabs (Azithromycin) .Marland Kitchen..Marland Kitchen Two by mouth now and  then one by mouth daily for 4 days    Bromfed Dm 30-2-10 Mg/57ml Syrp (Pseudoeph-bromphen-dm) .Marland Kitchen..Marland Kitchen Two tsp by mouth every 6 hpours as needed may sub for similar cough med this was from chronic sinusitis and was completed in Jan.  Instructed on treatment. Call if symptoms persist or worsen.   Complete Medication List: 1)  Aspirin 81 Mg Tbec (Aspirin) .... Take 1 tablet by mouth once a day 2)  Finasteride 5 Mg Tabs (Finasteride) .... Once daily 3)  Krill Oil 1000 Mg Caps (Krill oil) .... Take 300 once daily 4)  Zinc 15 66 Mg Tabs (Zinc sulfate) .... Once daily 5)  Lamotrigine 25 Mg Tbdp (Lamotrigine) .... 3 in the am and2 in the afternoon 6)  Magnesium 300 Mg Caps (Magnesium) .... Once daily 7)  Nexium 40 Mg Cpdr (Esomeprazole magnesium) .... Once daily 8)  Niacin 500 Mg Tabs (Niacin) .... Take 1 once a day 9)  Uroxatral 10 Mg Tb24 (Alfuzosin hcl) .... Take 1  tablet by mouth once a day 10)  Vytorin 10-20 Mg Tabs (Ezetimibe-simvastatin) .... Once daily 11)  Methylphenidate Hcl Cr 20 Mg Cr-tabs (Methylphenidate hcl) .... One by mouth daily 12)  Sertraline Hcl 100 Mg Tabs (Sertraline hcl) .... One by mouth daily  ( generic) 13)  Folic Acid 1 Mg Tabs (Folic acid) .Marland Kitchen.. 1 once daily 14)  Magnesium 300  .Marland Kitchen.. 1 once daily 15)  Iron 325 (65 Fe) Mg Tabs (Ferrous sulfate) .Marland Kitchen.. 1 once daily 16)  Glucosamine 500 Mg Caps (Glucosamine sulfate) .... One by mouth daily 17)  Vitamin D (ergocalciferol) 50000 Unit Caps (Ergocalciferol) .Marland Kitchen.. 1 every week for 3 months 18)  Verapamil Hcl Cr 240 Mg Cr-tabs (Verapamil hcl) .... One by mouth daily 19)  Hydrochlorothiazide 12.5 Mg Caps (Hydrochlorothiazide) .... One by mouth daily  20)  Azithromycin 250 Mg Tabs (Azithromycin) .... Two by mouth now and  then one by mouth daily for 4 days 21)  Bromfed Dm 30-2-10 Mg/35ml Syrp (Pseudoeph-bromphen-dm) .... Two tsp by mouth every 6 hpours as needed may sub for similar cough med  Hypertension Assessment/Plan:      The patient's hypertensive risk group is category B: At least one risk factor (excluding diabetes) with no target organ damage.  His calculated 10 year risk of coronary heart disease is 9 %.  Today's blood pressure is 140/84.  His blood pressure goal is < 140/90.  Patient Instructions: 1)  Please schedule a follow-up appointment in 1 month. Prescriptions: BROMFED DM 30-2-10 MG/5ML SYRP (PSEUDOEPH-BROMPHEN-DM) two tsp by mouth every 6 hpours as needed may sub for similar cough med  #6 oz x 0   Entered and Authorized by:   Stacie Glaze MD   Signed by:   Stacie Glaze MD on 01/20/2010   Method used:   Electronically to        [facility]* (retail)       [address]       [place], [place]  [id number]       Ph: [phone]       Fax: [phone]   RxID:   [id number] AZITHROMYCIN 250 MG TABS (AZITHROMYCIN) two by mouth now and  then one by mouth  daily for 4 days  #6 x 0   Entered and Authorized by:   Stacie Glaze MD   Signed by:   Stacie Glaze MD on 01/20/2010   Method used:   Electronically to        [facility]* (retail)       [address]       [place], [place]  [id number]       Ph: [phone]       Fax: [phone]   RxID:   [id number] HYDROCHLOROTHIAZIDE 12.5 MG CAPS (HYDROCHLOROTHIAZIDE) one by mouth daily  #30 x 11   Entered and Authorized by:   Stacie Glaze MD   Signed by:   Stacie Glaze MD on 01/20/2010   Method used:   Electronically to        [facility]* (retail)       [address]       [place], [place]  [address]       Ph: [phone]       Fax: [phone]   RxID:   [id number]

## 2011-01-08 NOTE — Assessment & Plan Note (Signed)
Summary: 3 MTH ROV // RS   Vital Signs:  Patient profile:   60 year old male Height:      65 inches Weight:      168 pounds BMI:     28.06 Temp:     98.3 degrees F oral Pulse rate:   80 / minute Resp:     14 per minute BP sitting:   130 / 76  (left arm)  Vitals Entered By: Willy Eddy, LPN (June 17, 2010 9:57 AM) CC: roa, Hypertension Management, Lipid Management   CC:  roa, Hypertension Management, and Lipid Management.  History of Present Illness: The pt presents for medication management after a change in the add med last OV head ache pattern is improved  Hypertension History:      He denies headache, chest pain, palpitations, dyspnea with exertion, orthopnea, PND, peripheral edema, visual symptoms, neurologic problems, syncope, and side effects from treatment.        Positive major cardiovascular risk factors include male age 30 years old or older, hyperlipidemia, and hypertension.  Negative major cardiovascular risk factors include no history of diabetes, negative family history for ischemic heart disease, and non-tobacco-user status.        Further assessment for target organ damage reveals no history of ASHD, cardiac end-organ damage (CHF/LVH), stroke/TIA, peripheral vascular disease, renal insufficiency, or hypertensive retinopathy.    Lipid Management History:      Positive NCEP/ATP III risk factors include male age 61 years old or older, HDL cholesterol less than 40, and hypertension.  Negative NCEP/ATP III risk factors include non-diabetic, no family history for ischemic heart disease, non-tobacco-user status, no ASHD (atherosclerotic heart disease), no prior stroke/TIA, no peripheral vascular disease, and no history of aortic aneurysm.      Preventive Screening-Counseling & Management  Alcohol-Tobacco     Smoking Status: never  Problems Prior to Update: 1)  Headache, Cluster  (ICD-339.00) 2)  Acute Frontal Sinusitis  (ICD-461.1) 3)  Chest Pain, Intermittent   (ICD-786.50) 4)  Unspecified Tachycardia  (ICD-785.0) 5)  Osteopenia  (ICD-733.90) 6)  Dupuytren's Contracture, Right  (ICD-728.6) 7)  Degeneration, Lumbar/lumbosacral Disc  (ICD-722.52) 8)  Preventive Health Care  (ICD-V70.0) 9)  Attention Deficit Disorder, Adult  (ICD-314.00) 10)  Depression  (ICD-311) 11)  Joint Pain, Leg  (ICD-719.46) 12)  Gastritis  (ICD-535.50) 13)  Hypertension  (ICD-401.9) 14)  Benign Prostatic Hypertrophy  (ICD-600.00) 15)  Hyperlipidemia  (ICD-272.4)  Medications Prior to Update: 1)  Aspirin 81 Mg Tbec (Aspirin) .... Take 1 Tablet By Mouth Once A Day 2)  Finasteride 5 Mg Tabs (Finasteride) .... Once Daily 3)  Krill Oil 1000 Mg Caps (Krill Oil) .... Take 300 Once Daily 4)  Zinc 15 66 Mg  Tabs (Zinc Sulfate) .... Once Daily 5)  Lamotrigine 25 Mg Tbdp (Lamotrigine) .... 3 in The Am And2 in The Afternoon 6)  Magnesium 300 Mg Caps (Magnesium) .... Once Daily 7)  Nexium 40 Mg Cpdr (Esomeprazole Magnesium) .... Once Daily 8)  Niacin 500 Mg Tabs (Niacin) .... Take 1 Once A Day 9)  Uroxatral 10 Mg Tb24 (Alfuzosin Hcl) .... Take 1 Tablet By Mouth Once A Day 10)  Vytorin 10-20 Mg Tabs (Ezetimibe-Simvastatin) .... Once Daily 11)  Vyvanse 40 Mg Caps (Lisdexamfetamine Dimesylate) .... One By Mouth Daily 12)  Sertraline Hcl 100 Mg Tabs (Sertraline Hcl) .... One By Mouth Daily  ( Generic) 13)  Folic Acid 1 Mg Tabs (Folic Acid) .Marland Kitchen.. 1 Once Daily 14)  Magnesium  300 .Marland Kitchen.. 1 Once Daily 15)  Iron 325 (65 Fe) Mg Tabs (Ferrous Sulfate) .Marland Kitchen.. 1 Once Daily 16)  Glucosamine 500 Mg Caps (Glucosamine Sulfate) .... One By Mouth Daily 17)  Vitamin D (Ergocalciferol) 50000 Unit Caps (Ergocalciferol) .Marland Kitchen.. 1 Every Week For 3 Months 18)  Verapamil Hcl Cr 240 Mg Cr-Tabs (Verapamil Hcl) .... One By Mouth Daily 19)  Hydrochlorothiazide 12.5 Mg Caps (Hydrochlorothiazide) .... One By Mouth Daily 20)  Astepro 0.15 % Soln (Azelastine Hcl) .... Two Spray in Nostril in Am  Current Medications  (verified): 1)  Aspirin 81 Mg Tbec (Aspirin) .... Take 1 Tablet By Mouth Once A Day 2)  Finasteride 5 Mg Tabs (Finasteride) .... Once Daily 3)  Krill Oil 1000 Mg Caps (Krill Oil) .Marland Kitchen.. 1 Once Daily 4)  Lamotrigine 25 Mg Tbdp (Lamotrigine) .... 3 in The Am And2 in The Afternoon 5)  Nexium 40 Mg Cpdr (Esomeprazole Magnesium) .... Once Daily 6)  Niacin 500 Mg Tabs (Niacin) .... Take 1 Once A Day 7)  Uroxatral 10 Mg Tb24 (Alfuzosin Hcl) .... Take 1 Tablet By Mouth Once A Day 8)  Vytorin 10-20 Mg Tabs (Ezetimibe-Simvastatin) .... Once Daily 9)  Vyvanse 40 Mg Caps (Lisdexamfetamine Dimesylate) .... One By Mouth Daily 10)  Sertraline Hcl 100 Mg Tabs (Sertraline Hcl) .... One By Mouth Daily  ( Generic) 11)  Verapamil Hcl Cr 240 Mg Cr-Tabs (Verapamil Hcl) .... One By Mouth Daily 12)  Hydrochlorothiazide 12.5 Mg Caps (Hydrochlorothiazide) .... One By Mouth Daily 13)  Astepro 0.15 % Soln (Azelastine Hcl) .... Two Spray in Nostril in Am As Needed  Allergies (verified): No Known Drug Allergies  Past History:  Family History: Last updated: 11/25/2007 Family Hsitory Headaches Family History High cholesterol  Social History: Last updated: 06/14/2007 Single Never Smoked Alcohol use-yes Drug use-no Regular exercise-yes  Risk Factors: Exercise: yes (06/14/2007)  Risk Factors: Smoking Status: never (06/17/2010)  Past medical, surgical, family and social histories (including risk factors) reviewed, and no changes noted (except as noted below).  Past Medical History: Reviewed history from 09/06/2008 and no changes required. 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 Orchiectomy  1970 L Right testical  hydroceole  Family History: Reviewed history from 11/25/2007 and no changes required. Family Hsitory Headaches Family History High  cholesterol  Social History: Reviewed history from 06/14/2007 and no changes required. Single Never Smoked Alcohol use-yes Drug use-no Regular exercise-yes  Review of Systems  The patient denies anorexia, fever, weight loss, weight gain, vision loss, decreased hearing, hoarseness, chest pain, syncope, dyspnea on exertion, peripheral edema, prolonged cough, headaches, hemoptysis, abdominal pain, melena, hematochezia, severe indigestion/heartburn, hematuria, incontinence, genital sores, muscle weakness, suspicious skin lesions, transient blindness, difficulty walking, depression, unusual weight change, abnormal bleeding, enlarged lymph nodes, angioedema, and breast masses.    Physical Exam  General:  Well-developed,well-nourished,in no acute distress; alert,appropriate and cooperative throughout examination Head:  normocephalic and atraumatic.   Eyes:  pupils equal and pupils round.   Ears:  R TM erythema and R TM bulging.   Nose:  mucosal edema and airflow obstruction.   Mouth:  posterior lymphoid hypertrophy and postnasal drip.   Neck:  No deformities, masses, or tenderness noted. Lungs:  Normal respiratory effort, chest expands symmetrically. Lungs are clear to auscultation, no crackles or wheezes. Heart:  Normal rate and regular rhythm. S1 and S2 normal without gallop, murmur, click, rub or other extra sounds. Abdomen:  soft and no distention.     Impression & Recommendations:  Problem # 1:  HEADACHE, CLUSTER (ICD-339.00) improved  Problem # 2:  ATTENTION DEFICIT DISORDER, ADULT (ICD-314.00) change in medications  Problem # 3:  BENIGN PROSTATIC HYPERTROPHY (ICD-600.00) doxy for 21 days with mild increased symptoms His updated medication list for this problem includes:    Finasteride 5 Mg Tabs (Finasteride) ..... Once daily    Uroxatral 10 Mg Tb24 (Alfuzosin hcl) .Marland Kitchen... Take 1 tablet by mouth once a day  PSA: 1.00 (09/16/2007)     last psa was 1.8 ( 2010)  Complete  Medication List: 1)  Aspirin 81 Mg Tbec (Aspirin) .... Take 1 tablet by mouth once a day 2)  Finasteride 5 Mg Tabs (Finasteride) .... Once daily 3)  Krill Oil 1000 Mg Caps (Krill oil) .Marland Kitchen.. 1 once daily 4)  Lamotrigine 25 Mg Tbdp (Lamotrigine) .... 3 in the am and2 in the afternoon 5)  Nexium 40 Mg Cpdr (Esomeprazole magnesium) .... Once daily 6)  Niacin 500 Mg Tabs (Niacin) .... Take 1 once a day 7)  Uroxatral 10 Mg Tb24 (Alfuzosin hcl) .... Take 1 tablet by mouth once a day 8)  Vytorin 10-20 Mg Tabs (Ezetimibe-simvastatin) .... Once daily 9)  Vyvanse 40 Mg Caps (Lisdexamfetamine dimesylate) .... One by mouth daily 10)  Sertraline Hcl 100 Mg Tabs (Sertraline hcl) .... One by mouth daily  ( generic) 11)  Verapamil Hcl Cr 240 Mg Cr-tabs (Verapamil hcl) .... One by mouth daily 12)  Hydrochlorothiazide 12.5 Mg Caps (Hydrochlorothiazide) .... One by mouth daily 13)  Astepro 0.15 % Soln (Azelastine hcl) .... Two spray in nostril in am as needed 14)  Doxycycline Hyclate 100 Mg Caps (Doxycycline hyclate) .... One by mouth two times a day for 21 days  Hypertension Assessment/Plan:      The patient's hypertensive risk group is category B: At least one risk factor (excluding diabetes) with no target organ damage.  His calculated 10 year risk of coronary heart disease is 7 %.  Today's blood pressure is 130/76.  His blood pressure goal is < 140/90.  Lipid Assessment/Plan:      Based on NCEP/ATP III, the patient's risk factor category is "2 or more risk factors and a calculated 10 year CAD risk of < 20%".  The patient's lipid goals are as follows: Total cholesterol goal is 200; LDL cholesterol goal is 130; HDL cholesterol goal is 40; Triglyceride goal is 150.  His LDL cholesterol goal has been met.    Patient Instructions: 1)  CPX with labs at works or here 2)  Please schedule a follow-up appointment in 3 months. Prescriptions: VYVANSE 40 MG CAPS (LISDEXAMFETAMINE DIMESYLATE) one by mouth daily  #30 x  0   Entered and Authorized by:   Stacie Glaze MD   Signed by:   Stacie Glaze MD on 06/17/2010   Method used:   Print then Give to Patient   RxID:   [id number] VYVANSE 40 MG CAPS (LISDEXAMFETAMINE DIMESYLATE) one by mouth daily  #30 x 0   Entered and Authorized by:   Stacie Glaze MD   Signed by:   Stacie Glaze MD on 06/17/2010   Method used:   Print then Give to Patient   RxID:   [id number] VYVANSE 40 MG CAPS (LISDEXAMFETAMINE DIMESYLATE) one by mouth daily  #30 x 0   Entered and Authorized by:   Stacie Glaze MD   Signed by:   Stacie Glaze  MD on 06/17/2010   Method used:   Print then Give to Patient   RxID:   [id number] DOXYCYCLINE HYCLATE 100 MG CAPS (DOXYCYCLINE HYCLATE) one by mouth two times a day for 21 days  #42 x 0   Entered and Authorized by:   Stacie Glaze MD   Signed by:   Stacie Glaze MD on 06/17/2010   Method used:   Electronically to        [facility]* (retail)       [address]       [place], [place]  [id number]       Ph: [phone]       Fax: [phone]   RxID:   [id number]

## 2011-01-08 NOTE — Assessment & Plan Note (Signed)
Summary: 1 month rov/nrj   Vital Signs:  Patient profile:   60 year old male Height:      65 inches Weight:      171 pounds BMI:     28.56 Temp:     98.2 degrees F oral Pulse rate:   72 / minute Resp:     14 per minute BP sitting:   132 / 80  (left arm)  Vitals Entered By: Willy Eddy, LPN (February 17, 2010 1:40 PM) CC: roa, Hypertension Management   CC:  roa and Hypertension Management.  History of Present Illness: follow up for tach cardia and head aches  Hypertension History:      He denies headache, chest pain, palpitations, dyspnea with exertion, orthopnea, PND, peripheral edema, visual symptoms, neurologic problems, syncope, and side effects from treatment.        Positive major cardiovascular risk factors include male age 57 years old or older, hyperlipidemia, and hypertension.  Negative major cardiovascular risk factors include no history of diabetes, negative family history for ischemic heart disease, and non-tobacco-user status.        Further assessment for target organ damage reveals no history of ASHD, cardiac end-organ damage (CHF/LVH), stroke/TIA, peripheral vascular disease, renal insufficiency, or hypertensive retinopathy.     Preventive Screening-Counseling & Management  Alcohol-Tobacco     Smoking Status: never  Problems Prior to Update: 1)  Acute Frontal Sinusitis  (ICD-461.1) 2)  Chest Pain, Intermittent  (ICD-786.50) 3)  Unspecified Tachycardia  (ICD-785.0) 4)  Osteopenia  (ICD-733.90) 5)  Dupuytren's Contracture, Right  (ICD-728.6) 6)  Degeneration, Lumbar/lumbosacral Disc  (ICD-722.52) 7)  Preventive Health Care  (ICD-V70.0) 8)  Attention Deficit Disorder, Adult  (ICD-314.00) 9)  Depression  (ICD-311) 10)  Joint Pain, Leg  (ICD-719.46) 11)  Gastritis  (ICD-535.50) 12)  Hypertension  (ICD-401.9) 13)  Benign Prostatic Hypertrophy  (ICD-600.00) 14)  Hyperlipidemia  (ICD-272.4)  Current Problems (verified): 1)  Acute Frontal Sinusitis   (ICD-461.1) 2)  Chest Pain, Intermittent  (ICD-786.50) 3)  Unspecified Tachycardia  (ICD-785.0) 4)  Osteopenia  (ICD-733.90) 5)  Dupuytren's Contracture, Right  (ICD-728.6) 6)  Degeneration, Lumbar/lumbosacral Disc  (ICD-722.52) 7)  Preventive Health Care  (ICD-V70.0) 8)  Attention Deficit Disorder, Adult  (ICD-314.00) 9)  Depression  (ICD-311) 10)  Joint Pain, Leg  (ICD-719.46) 11)  Gastritis  (ICD-535.50) 12)  Hypertension  (ICD-401.9) 13)  Benign Prostatic Hypertrophy  (ICD-600.00) 14)  Hyperlipidemia  (ICD-272.4)  Medications Prior to Update: 1)  Aspirin 81 Mg Tbec (Aspirin) .... Take 1 Tablet By Mouth Once A Day 2)  Finasteride 5 Mg Tabs (Finasteride) .... Once Daily 3)  Krill Oil 1000 Mg Caps (Krill Oil) .... Take 300 Once Daily 4)  Zinc 15 66 Mg  Tabs (Zinc Sulfate) .... Once Daily 5)  Lamotrigine 25 Mg Tbdp (Lamotrigine) .... 3 in The Am And2 in The Afternoon 6)  Magnesium 300 Mg Caps (Magnesium) .... Once Daily 7)  Nexium 40 Mg Cpdr (Esomeprazole Magnesium) .... Once Daily 8)  Niacin 500 Mg Tabs (Niacin) .... Take 1 Once A Day 9)  Uroxatral 10 Mg Tb24 (Alfuzosin Hcl) .... Take 1 Tablet By Mouth Once A Day 10)  Vytorin 10-20 Mg Tabs (Ezetimibe-Simvastatin) .... Once Daily 11)  Methylphenidate Hcl Cr 20 Mg Cr-Tabs (Methylphenidate Hcl) .... One By Mouth Daily 12)  Sertraline Hcl 100 Mg Tabs (Sertraline Hcl) .... One By Mouth Daily  ( Generic) 13)  Folic Acid 1 Mg Tabs (Folic Acid) .Marland Kitchen.. 1 Once Daily  14)  Magnesium 300 .Marland Kitchen.. 1 Once Daily 15)  Iron 325 (65 Fe) Mg Tabs (Ferrous Sulfate) .Marland Kitchen.. 1 Once Daily 16)  Glucosamine 500 Mg Caps (Glucosamine Sulfate) .... One By Mouth Daily 17)  Vitamin D (Ergocalciferol) 50000 Unit Caps (Ergocalciferol) .Marland Kitchen.. 1 Every Week For 3 Months 18)  Verapamil Hcl Cr 240 Mg Cr-Tabs (Verapamil Hcl) .... One By Mouth Daily 19)  Hydrochlorothiazide 12.5 Mg Caps (Hydrochlorothiazide) .... One By Mouth Daily 20)  Azithromycin 250 Mg Tabs (Azithromycin) ....  Two By Mouth Now and  Then One By Mouth Daily For 4 Days 21)  Bromfed Dm 30-2-10 Mg/57ml Syrp (Pseudoeph-Bromphen-Dm) .... Two Tsp By Mouth Every 6 Hpours As Needed May Sub For Similar Cough Med  Current Medications (verified): 1)  Aspirin 81 Mg Tbec (Aspirin) .... Take 1 Tablet By Mouth Once A Day 2)  Finasteride 5 Mg Tabs (Finasteride) .... Once Daily 3)  Krill Oil 1000 Mg Caps (Krill Oil) .... Take 300 Once Daily 4)  Zinc 15 66 Mg  Tabs (Zinc Sulfate) .... Once Daily 5)  Lamotrigine 25 Mg Tbdp (Lamotrigine) .... 3 in The Am And2 in The Afternoon 6)  Magnesium 300 Mg Caps (Magnesium) .... Once Daily 7)  Nexium 40 Mg Cpdr (Esomeprazole Magnesium) .... Once Daily 8)  Niacin 500 Mg Tabs (Niacin) .... Take 1 Once A Day 9)  Uroxatral 10 Mg Tb24 (Alfuzosin Hcl) .... Take 1 Tablet By Mouth Once A Day 10)  Vytorin 10-20 Mg Tabs (Ezetimibe-Simvastatin) .... Once Daily 11)  Methylphenidate Hcl Cr 20 Mg Cr-Tabs (Methylphenidate Hcl) .... One By Mouth Daily 12)  Sertraline Hcl 100 Mg Tabs (Sertraline Hcl) .... One By Mouth Daily  ( Generic) 13)  Folic Acid 1 Mg Tabs (Folic Acid) .Marland Kitchen.. 1 Once Daily 14)  Magnesium 300 .Marland Kitchen.. 1 Once Daily 15)  Iron 325 (65 Fe) Mg Tabs (Ferrous Sulfate) .Marland Kitchen.. 1 Once Daily 16)  Glucosamine 500 Mg Caps (Glucosamine Sulfate) .... One By Mouth Daily 17)  Vitamin D (Ergocalciferol) 50000 Unit Caps (Ergocalciferol) .Marland Kitchen.. 1 Every Week For 3 Months 18)  Verapamil Hcl Cr 240 Mg Cr-Tabs (Verapamil Hcl) .... One By Mouth Daily 19)  Hydrochlorothiazide 12.5 Mg Caps (Hydrochlorothiazide) .... One By Mouth Daily 20)  Doxycycline Hyclate 100 Mg Caps (Doxycycline Hyclate) .... One By Mouth Two Times A Day For 30 Days 21)  Astepro 0.15 % Soln (Azelastine Hcl) .... Two Spray in Nostril in Am  Allergies (verified): No Known Drug Allergies  Past History:  Family History: Last updated: 11/25/2007 Family Hsitory Headaches Family History High cholesterol  Social History: Last updated:  06/14/2007 Single Never Smoked Alcohol use-yes Drug use-no Regular exercise-yes  Risk Factors: Exercise: yes (06/14/2007)  Risk Factors: Smoking Status: never (02/17/2010)  Past medical, surgical, family and social histories (including risk factors) reviewed, and no changes noted (except as noted below).  Past Medical History: Reviewed history from 09/06/2008 and no changes required. 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 Orchiectomy  1970 L Right testical  hydroceole  Family History: Reviewed history from 11/25/2007 and no changes required. Family Hsitory Headaches Family History High cholesterol  Social History: Reviewed history from 06/14/2007 and no changes required. Single Never Smoked Alcohol use-yes Drug use-no Regular exercise-yes  Review of Systems  The patient denies anorexia, fever, weight loss, weight gain, vision loss, decreased hearing, hoarseness, chest pain, syncope, dyspnea on exertion, peripheral edema, prolonged cough, headaches, hemoptysis,  abdominal pain, melena, hematochezia, severe indigestion/heartburn, hematuria, incontinence, genital sores, muscle weakness, suspicious skin lesions, transient blindness, difficulty walking, depression, unusual weight change, abnormal bleeding, enlarged lymph nodes, angioedema, and breast masses.    Physical Exam  General:  Well-developed,well-nourished,in no acute distress; alert,appropriate and cooperative throughout examination Head:  normocephalic and atraumatic.   Eyes:  pupils equal and pupils round.   Ears:  R TM erythema and R TM bulging.   Nose:  mucosal edema and airflow obstruction.   Mouth:  posterior lymphoid hypertrophy and postnasal drip.   Neck:  No deformities, masses, or tenderness noted. Lungs:  Normal respiratory effort, chest expands symmetrically.  Lungs are clear to auscultation, no crackles or wheezes. Heart:  Normal rate and regular rhythm. S1 and S2 normal without gallop, murmur, click, rub or other extra sounds. Abdomen:  soft and no distention.   Msk:  normal ROM, no joint tenderness, and no joint swelling.   Extremities:  trace left pedal edema and trace right pedal edema.   Neurologic:  alert & oriented X3 and gait normal.     Impression & Recommendations:  Problem # 1:  UNSPECIFIED TACHYCARDIA (ICD-785.0) pt has episodic tachycardia  today the pulse is 66 and regular concern over the use of the stimulants mood is stable and he is concerned about stopping this medication  Problem # 2:  HYPERTENSION (ICD-401.9) the fluid retention has resolved His updated medication list for this problem includes:    Verapamil Hcl Cr 240 Mg Cr-tabs (Verapamil hcl) ..... One by mouth daily    Hydrochlorothiazide 12.5 Mg Caps (Hydrochlorothiazide) ..... One by mouth daily  BP today: 132/80 Prior BP: 140/84 (01/20/2010)  Prior 10 Yr Risk Heart Disease: 9 % (01/20/2010)  Labs Reviewed: K+: 4.0 (09/16/2007) Creat: : 1.0 (09/16/2007)   Chol: 134 (09/16/2007)   HDL: 36.0 (09/16/2007)   LDL: 79 (09/16/2007)   TG: 95 (09/16/2007)  Problem # 3:  HEADACHE, CLUSTER (ICD-339.00) persistant cluster type  Problem # 4:  ACUTE FRONTAL SINUSITIS (ICD-461.1) the head aches may be aq transformaton form acute to chronic sinusitis The following medications were removed from the medication list:    Azithromycin 250 Mg Tabs (Azithromycin) .Marland Kitchen..Marland Kitchen Two by mouth now and  then one by mouth daily for 4 days    Bromfed Dm 30-2-10 Mg/34ml Syrp (Pseudoeph-bromphen-dm) .Marland Kitchen..Marland Kitchen Two tsp by mouth every 6 hpours as needed may sub for similar cough med His updated medication list for this problem includes:    Doxycycline Hyclate 100 Mg Caps (Doxycycline hyclate) ..... One by mouth two times a day for 30 days    Astepro 0.15 % Soln (Azelastine hcl) .Marland Kitchen..Marland Kitchen Two spray in  nostril in am  Instructed on treatment. Call if symptoms persist or worsen.   Hypertension Assessment/Plan:      The patient's hypertensive risk group is category B: At least one risk factor (excluding diabetes) with no target organ damage.  His calculated 10 year risk of coronary heart disease is 7 %.  Today's blood pressure is 132/80.  His blood pressure goal is < 140/90.  Patient Instructions: 1)  Please schedule a follow-up appointment in 1 month. Prescriptions: DOXYCYCLINE HYCLATE 100 MG CAPS (DOXYCYCLINE HYCLATE) one by mouth two times a day for 30 days  #60 x 0   Entered and Authorized by:   Stacie Glaze MD   Signed by:   Stacie Glaze MD on 02/17/2010   Method used:   Electronically to  [facility]* (retail)       [address]       [place], [place]  [id number]       Ph: [phone]       Fax: [phone]   RxID:   [id number]

## 2011-01-08 NOTE — Assessment & Plan Note (Signed)
Summary: 2 month fup//ccm   Vital Signs:  Patient profile:   59 year old male Height:      65 inches Weight:      172 pounds BMI:     28.73 Temp:     98.2 degrees F oral Pulse rate:   72 / minute Resp:     14 per minute BP sitting:   130 / 76  (left arm)  Vitals Entered By: Willy Eddy, LPN (December 12, 2010 1:28 PM) CC: roa- doesnt always take lamotrigine in afternoon, Hypertension Management Is Patient Diabetic? No   Primary Care Provider:  Stacie Glaze MD  CC:  roa- doesnt always take lamotrigine in afternoon and Hypertension Management.  History of Present Illness: pt has been doing well... was feeling sluggish on the vyvance proving that the increased dose did not help He often missed the eveing dose of lamictal   Hypertension History:      He denies headache, chest pain, palpitations, dyspnea with exertion, orthopnea, PND, peripheral edema, visual symptoms, neurologic problems, syncope, and side effects from treatment.        Positive major cardiovascular risk factors include male age 45 years old or older, hyperlipidemia, and hypertension.  Negative major cardiovascular risk factors include no history of diabetes, negative family history for ischemic heart disease, and non-tobacco-user status.        Further assessment for target organ damage reveals no history of ASHD, cardiac end-organ damage (CHF/LVH), stroke/TIA, peripheral vascular disease, renal insufficiency, or hypertensive retinopathy.     Preventive Screening-Counseling & Management  Alcohol-Tobacco     Smoking Status: never     Tobacco Counseling: not indicated; no tobacco use  Problems Prior to Update: 1)  Headache, Cluster  (ICD-339.00) 2)  Acute Frontal Sinusitis  (ICD-461.1) 3)  Chest Pain, Intermittent  (ICD-786.50) 4)  Unspecified Tachycardia  (ICD-785.0) 5)  Osteopenia  (ICD-733.90) 6)  Dupuytren's Contracture, Right  (ICD-728.6) 7)  Degeneration, Lumbar/lumbosacral Disc   (ICD-722.52) 8)  Preventive Health Care  (ICD-V70.0) 9)  Attention Deficit Disorder, Adult  (ICD-314.00) 10)  Depression  (ICD-311) 11)  Joint Pain, Leg  (ICD-719.46) 12)  Gastritis  (ICD-535.50) 13)  Hypertension  (ICD-401.9) 14)  Benign Prostatic Hypertrophy  (ICD-600.00) 15)  Hyperlipidemia  (ICD-272.4)  Current Problems (verified): 1)  Headache, Cluster  (ICD-339.00) 2)  Acute Frontal Sinusitis  (ICD-461.1) 3)  Chest Pain, Intermittent  (ICD-786.50) 4)  Unspecified Tachycardia  (ICD-785.0) 5)  Osteopenia  (ICD-733.90) 6)  Dupuytren's Contracture, Right  (ICD-728.6) 7)  Degeneration, Lumbar/lumbosacral Disc  (ICD-722.52) 8)  Preventive Health Care  (ICD-V70.0) 9)  Attention Deficit Disorder, Adult  (ICD-314.00) 10)  Depression  (ICD-311) 11)  Joint Pain, Leg  (ICD-719.46) 12)  Gastritis  (ICD-535.50) 13)  Hypertension  (ICD-401.9) 14)  Benign Prostatic Hypertrophy  (ICD-600.00) 15)  Hyperlipidemia  (ICD-272.4)  Medications Prior to Update: 1)  Aspirin 81 Mg Tbec (Aspirin) .... Take 1 Tablet By Mouth Once A Day 2)  Finasteride 5 Mg Tabs (Finasteride) .... Once Daily 3)  Krill Oil 1000 Mg Caps (Krill Oil) .Marland Kitchen.. 1 Once Daily 4)  Lamotrigine 25 Mg Tbdp (Lamotrigine) .... 3 in The Am And2 in The Afternoon 5)  Nexium 40 Mg Cpdr (Esomeprazole Magnesium) .... Once Daily 6)  Niacin 500 Mg Tabs (Niacin) .... Take 1 Once A Day 7)  Uroxatral 10 Mg Tb24 (Alfuzosin Hcl) .... Take 1 Tablet By Mouth Once A Day 8)  Vytorin 10-20 Mg Tabs (Ezetimibe-Simvastatin) .... Once Daily  9)  Vyvanse 70 Mg Caps (Lisdexamfetamine Dimesylate) .... One By Mouth Daily 10)  Sertraline Hcl 100 Mg Tabs (Sertraline Hcl) .... One By Mouth Daily  ( Generic) 11)  Verapamil Hcl Cr 240 Mg Cr-Tabs (Verapamil Hcl) .... One By Mouth Daily 12)  Hydrochlorothiazide 12.5 Mg Caps (Hydrochlorothiazide) .... One By Mouth Daily 13)  Ergocalciferol 50000 Unit Caps (Ergocalciferol) .... One By Mouth Weekly 14)  Calan 120 Mg  Tabs (Verapamil Hcl)  Current Medications (verified): 1)  Aspirin 81 Mg Tbec (Aspirin) .... Take 1 Tablet By Mouth Once A Day 2)  Finasteride 5 Mg Tabs (Finasteride) .... Once Daily 3)  Krill Oil 1000 Mg Caps (Krill Oil) .Marland Kitchen.. 1 Once Daily 4)  Lamictal Xr 100 Mg Xr24h-Tab (Lamotrigine) .... One By Mouth Daily 5)  Nexium 40 Mg Cpdr (Esomeprazole Magnesium) .... Once Daily 6)  Niacin 500 Mg Tabs (Niacin) .... Take 1 Once A Day 7)  Uroxatral 10 Mg Tb24 (Alfuzosin Hcl) .... Take 1 Tablet By Mouth Once A Day 8)  Vytorin 10-20 Mg Tabs (Ezetimibe-Simvastatin) .... Once Daily 9)  Sertraline Hcl 100 Mg Tabs (Sertraline Hcl) .... One By Mouth Daily  ( Generic) 10)  Verapamil Hcl Cr 240 Mg Cr-Tabs (Verapamil Hcl) .... One By Mouth Daily 11)  Hydrochlorothiazide 12.5 Mg Caps (Hydrochlorothiazide) .... One By Mouth Daily 12)  Ergocalciferol 50000 Unit Caps (Ergocalciferol) .... One By Mouth Weekly 13)  Ritalin La 20 Mg Xr24h-Cap (Methylphenidate Hcl) .... One By Mouth Daily May Sub For Generic  Allergies (verified): No Known Drug Allergies  Past History:  Family History: Last updated: 11/25/2007 Family Hsitory Headaches Family History High cholesterol  Social History: Last updated: 06/14/2007 Single Never Smoked Alcohol use-yes Drug use-no Regular exercise-yes  Risk Factors: Exercise: yes (06/14/2007)  Risk Factors: Smoking Status: never (12/12/2010)  Past medical, surgical, family and social histories (including risk factors) reviewed, and no changes noted (except as noted below).  Past Medical History: Reviewed history from 09/06/2008 and no changes required. 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 Orchiectomy  1970 L Right testical  hydroceole  Family History: Reviewed history from 11/25/2007 and no changes required. Family Hsitory  Headaches Family History High cholesterol  Social History: Reviewed history from 06/14/2007 and no changes required. Single Never Smoked Alcohol use-yes Drug use-no Regular exercise-yes  Review of Systems  The patient denies anorexia, fever, weight loss, weight gain, vision loss, decreased hearing, hoarseness, chest pain, syncope, dyspnea on exertion, peripheral edema, prolonged cough, headaches, hemoptysis, abdominal pain, melena, hematochezia, severe indigestion/heartburn, hematuria, incontinence, genital sores, muscle weakness, suspicious skin lesions, transient blindness, difficulty walking, depression, unusual weight change, abnormal bleeding, enlarged lymph nodes, angioedema, breast masses, and testicular masses.    Physical Exam  General:  Well-developed,well-nourished,in no acute distress; alert,appropriate and cooperative throughout examination Head:  normocephalic and atraumatic.   Eyes:  pupils equal and pupils round.   Ears:  R TM erythema and R TM bulging.   Nose:  mucosal edema and airflow obstruction.   Mouth:  posterior lymphoid hypertrophy and postnasal drip.   Neck:  No deformities, masses, or tenderness noted. Lungs:  Normal respiratory effort, chest expands symmetrically. Lungs are clear to auscultation, no crackles or wheezes. Heart:  Normal rate and regular rhythm. S1 and S2 normal without gallop, murmur, click, rub or other extra sounds. Abdomen:  soft and no distention.     Impression & Recommendations:  Problem #  1:  ATTENTION DEFICIT DISORDER, ADULT (ICD-314.00) Assessment Deteriorated change of medications  Problem # 2:  HYPERTENSION (ICD-401.9) Assessment: Unchanged  The following medications were removed from the medication list:    Calan 120 Mg Tabs (Verapamil hcl) His updated medication list for this problem includes:    Verapamil Hcl Cr 240 Mg Cr-tabs (Verapamil hcl) ..... One by mouth daily    Hydrochlorothiazide 12.5 Mg Caps  (Hydrochlorothiazide) ..... One by mouth daily  BP today: 130/76 Prior BP: 136/80 (10/10/2010)  10 Yr Risk Heart Disease: 6 % Prior 10 Yr Risk Heart Disease: 7 % (06/17/2010)  Labs Reviewed: K+: 4.0 (09/16/2007) Creat: : .96 (09/29/2010)   Chol: 148 (09/29/2010)   HDL: 49 (09/29/2010)   LDL: 71 (09/29/2010)   TG: 95 (09/16/2007)  Problem # 3:  DEPRESSION (ICD-311) Assessment: Unchanged  His updated medication list for this problem includes:    Sertraline Hcl 100 Mg Tabs (Sertraline hcl) ..... One by mouth daily  ( generic)  Discussed treatment options, including trial of antidpressant medication. Will refer to behavioral health. Follow-up call in in 24-48 hours and recheck in 2 weeks, sooner as needed. Patient agrees to call if any worsening of symptoms or thoughts of doing harm arise. Verified that the patient has no suicidal ideation at this time.   Problem # 4:  HEADACHE, CLUSTER (ICD-339.00) Assessment: Improved  Complete Medication List: 1)  Aspirin 81 Mg Tbec (Aspirin) .... Take 1 tablet by mouth once a day 2)  Finasteride 5 Mg Tabs (Finasteride) .... Once daily 3)  Krill Oil 1000 Mg Caps (Krill oil) .Marland Kitchen.. 1 once daily 4)  Lamictal Xr 100 Mg Xr24h-tab (Lamotrigine) .... One by mouth daily 5)  Nexium 40 Mg Cpdr (Esomeprazole magnesium) .... Once daily 6)  Niacin 500 Mg Tabs (Niacin) .... Take 1 once a day 7)  Uroxatral 10 Mg Tb24 (Alfuzosin hcl) .... Take 1 tablet by mouth once a day 8)  Vytorin 10-20 Mg Tabs (Ezetimibe-simvastatin) .... Once daily 9)  Sertraline Hcl 100 Mg Tabs (Sertraline hcl) .... One by mouth daily  ( generic) 10)  Verapamil Hcl Cr 240 Mg Cr-tabs (Verapamil hcl) .... One by mouth daily 11)  Hydrochlorothiazide 12.5 Mg Caps (Hydrochlorothiazide) .... One by mouth daily 12)  Ergocalciferol 50000 Unit Caps (Ergocalciferol) .... One by mouth weekly 13)  Ritalin La 20 Mg Xr24h-cap (Methylphenidate hcl) .... One by mouth daily may sub for generic  Hypertension  Assessment/Plan:      The patient's hypertensive risk group is category B: At least one risk factor (excluding diabetes) with no target organ damage.  His calculated 10 year risk of coronary heart disease is 6 %.  Today's blood pressure is 130/76.  His blood pressure goal is < 140/90.  Patient Instructions: 1)  Please schedule a follow-up appointment in 2 months. Prescriptions: RITALIN LA 20 MG XR24H-CAP (METHYLPHENIDATE HCL) one by mouth daily may sub for generic  #30 x 0   Entered and Authorized by:   Stacie Glaze MD   Signed by:   Stacie Glaze MD on 12/12/2010   Method used:   Print then Give to Patient   RxID:   [id number] RITALIN LA 20 MG XR24H-CAP (METHYLPHENIDATE HCL) one by mouth daily may sub for generic  #30 x 0   Entered and Authorized by:   Stacie Glaze MD   Signed by:   Stacie Glaze MD on 12/12/2010   Method used:   Print then Give to Patient  RxID:   [id number] LAMICTAL XR 100 MG XR24H-TAB (LAMOTRIGINE) one by mouth daily  #30 x 11   Entered and Authorized by:   Stacie Glaze MD   Signed by:   Stacie Glaze MD on 12/12/2010   Method used:   Electronically to        [facility]* (retail)       [address]       [place], [place]  [id number]       Ph: [phone]       Fax: [phone]   RxID:   [id number]    Orders Added: 1)  Est. Patient Level IV [34742]

## 2011-01-08 NOTE — Letter (Signed)
Summary: [facility]  [facility]   Imported By: Sherian Rein 05/17/2010 10:07:32  _____________________________________________________________________  External Attachment:    Type:   Image     Comment:   External Document

## 2011-01-08 NOTE — Progress Notes (Signed)
Summary: please clarify  Phone Note From Pharmacy Call back at [phone]   Caller: mail order pharmacy---triage vm Summary of Call: please clarify drug therapy of Verapamil Hcl ER tabs.  Invoice # [id number].   please call back by 11-07-2010 by 2pm or rx will be returned to patient unfilled. Initial call taken by: Warnell Forester,  November 05, 2010 10:29 AM  Follow-up for Phone Call        talked with pharmacist Follow-up by: Willy Eddy, LPN,  November 05, 2010 10:41 AM    New/Updated Medications: CALAN 120 MG TABS (VERAPAMIL HCL)

## 2011-01-08 NOTE — Assessment & Plan Note (Signed)
Summary: cpx at 11:45/moved from am/bmw   Vital Signs:  Patient profile:   60 year old male Height:      65 inches Weight:      174 pounds BMI:     29.06 Temp:     98.2 degrees F oral Pulse rate:   72 / minute Resp:     14 per minute BP sitting:   136 / 80  (left arm)  Vitals Entered By: Willy Eddy, LPN (October 10, 2010 12:06 PM) CC: cpx- labs from work Is Patient Diabetic? No   Primary Care Provider:  Stacie Glaze MD  CC:  cpx- labs from work.  History of Present Illness: The pt was asked about all immunizations, health maint. services that are appropriate to their age and was given guidance on diet exercize  and weight management   Preventive Screening-Counseling & Management  Alcohol-Tobacco     Smoking Status: never     Tobacco Counseling: not indicated; no tobacco use  Problems Prior to Update: 1)  Headache, Cluster  (ICD-339.00) 2)  Acute Frontal Sinusitis  (ICD-461.1) 3)  Chest Pain, Intermittent  (ICD-786.50) 4)  Unspecified Tachycardia  (ICD-785.0) 5)  Osteopenia  (ICD-733.90) 6)  Dupuytren's Contracture, Right  (ICD-728.6) 7)  Degeneration, Lumbar/lumbosacral Disc  (ICD-722.52) 8)  Preventive Health Care  (ICD-V70.0) 9)  Attention Deficit Disorder, Adult  (ICD-314.00) 10)  Depression  (ICD-311) 11)  Joint Pain, Leg  (ICD-719.46) 12)  Gastritis  (ICD-535.50) 13)  Hypertension  (ICD-401.9) 14)  Benign Prostatic Hypertrophy  (ICD-600.00) 15)  Hyperlipidemia  (ICD-272.4)  Current Problems (verified): 1)  Headache, Cluster  (ICD-339.00) 2)  Acute Frontal Sinusitis  (ICD-461.1) 3)  Chest Pain, Intermittent  (ICD-786.50) 4)  Unspecified Tachycardia  (ICD-785.0) 5)  Osteopenia  (ICD-733.90) 6)  Dupuytren's Contracture, Right  (ICD-728.6) 7)  Degeneration, Lumbar/lumbosacral Disc  (ICD-722.52) 8)  Preventive Health Care  (ICD-V70.0) 9)  Attention Deficit Disorder, Adult  (ICD-314.00) 10)  Depression  (ICD-311) 11)  Joint Pain, Leg   (ICD-719.46) 12)  Gastritis  (ICD-535.50) 13)  Hypertension  (ICD-401.9) 14)  Benign Prostatic Hypertrophy  (ICD-600.00) 15)  Hyperlipidemia  (ICD-272.4)  Medications Prior to Update: 1)  Aspirin 81 Mg Tbec (Aspirin) .... Take 1 Tablet By Mouth Once A Day 2)  Finasteride 5 Mg Tabs (Finasteride) .... Once Daily 3)  Krill Oil 1000 Mg Caps (Krill Oil) .Marland Kitchen.. 1 Once Daily 4)  Lamotrigine 25 Mg Tbdp (Lamotrigine) .... 3 in The Am And2 in The Afternoon 5)  Nexium 40 Mg Cpdr (Esomeprazole Magnesium) .... Once Daily 6)  Niacin 500 Mg Tabs (Niacin) .... Take 1 Once A Day 7)  Uroxatral 10 Mg Tb24 (Alfuzosin Hcl) .... Take 1 Tablet By Mouth Once A Day 8)  Vytorin 10-20 Mg Tabs (Ezetimibe-Simvastatin) .... Once Daily 9)  Vyvanse 40 Mg Caps (Lisdexamfetamine Dimesylate) .... One By Mouth Daily 10)  Sertraline Hcl 100 Mg Tabs (Sertraline Hcl) .... One By Mouth Daily  ( Generic) 11)  Verapamil Hcl Cr 240 Mg Cr-Tabs (Verapamil Hcl) .... One By Mouth Daily 12)  Hydrochlorothiazide 12.5 Mg Caps (Hydrochlorothiazide) .... One By Mouth Daily 13)  Astepro 0.15 % Soln (Azelastine Hcl) .... Two Spray in Nostril in Am As Needed 14)  Doxycycline Hyclate 100 Mg Caps (Doxycycline Hyclate) .... One By Mouth Two Times A Day For 21 Days  Current Medications (verified): 1)  Aspirin 81 Mg Tbec (Aspirin) .... Take 1 Tablet By Mouth Once A Day 2)  Finasteride 5  Mg Tabs (Finasteride) .... Once Daily 3)  Krill Oil 1000 Mg Caps (Krill Oil) .Marland Kitchen.. 1 Once Daily 4)  Lamotrigine 25 Mg Tbdp (Lamotrigine) .... 3 in The Am And2 in The Afternoon 5)  Nexium 40 Mg Cpdr (Esomeprazole Magnesium) .... Once Daily 6)  Niacin 500 Mg Tabs (Niacin) .... Take 1 Once A Day 7)  Uroxatral 10 Mg Tb24 (Alfuzosin Hcl) .... Take 1 Tablet By Mouth Once A Day 8)  Vytorin 10-20 Mg Tabs (Ezetimibe-Simvastatin) .... Once Daily 9)  Vyvanse 70 Mg Caps (Lisdexamfetamine Dimesylate) .... One By Mouth Daily 10)  Sertraline Hcl 100 Mg Tabs (Sertraline Hcl) ....  One By Mouth Daily  ( Generic) 11)  Verapamil Hcl Cr 240 Mg Cr-Tabs (Verapamil Hcl) .... One By Mouth Daily 12)  Hydrochlorothiazide 12.5 Mg Caps (Hydrochlorothiazide) .... One By Mouth Daily 13)  Ergocalciferol 50000 Unit Caps (Ergocalciferol) .... One By Mouth Weekly  Allergies (verified): No Known Drug Allergies  Past History:  Family History: Last updated: 11/25/2007 Family Hsitory Headaches Family History High cholesterol  Social History: Last updated: 06/14/2007 Single Never Smoked Alcohol use-yes Drug use-no Regular exercise-yes  Risk Factors: Exercise: yes (06/14/2007)  Risk Factors: Smoking Status: never (10/10/2010)  Past medical, surgical, family and social histories (including risk factors) reviewed, and no changes noted (except as noted below).  Past Medical History: Reviewed history from 09/06/2008 and no changes required. 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 Orchiectomy  1970 L Right testical  hydroceole  Family History: Reviewed history from 11/25/2007 and no changes required. Family Hsitory Headaches Family History High cholesterol  Social History: Reviewed history from 06/14/2007 and no changes required. Single Never Smoked Alcohol use-yes Drug use-no Regular exercise-yes  Review of Systems  The patient denies anorexia, fever, weight loss, weight gain, vision loss, decreased hearing, hoarseness, chest pain, syncope, dyspnea on exertion, peripheral edema, prolonged cough, headaches, hemoptysis, abdominal pain, melena, hematochezia, severe indigestion/heartburn, hematuria, incontinence, genital sores, muscle weakness, suspicious skin lesions, transient blindness, difficulty walking, depression, unusual weight change, abnormal bleeding, enlarged lymph nodes, angioedema, and breast masses.    Physical  Exam  General:  Well-developed,well-nourished,in no acute distress; alert,appropriate and cooperative throughout examination Head:  normocephalic and atraumatic.   Eyes:  pupils equal and pupils round.   Ears:  R TM erythema and R TM bulging.   Nose:  mucosal edema and airflow obstruction.   Mouth:  posterior lymphoid hypertrophy and postnasal drip.   Neck:  No deformities, masses, or tenderness noted. Lungs:  Normal respiratory effort, chest expands symmetrically. Lungs are clear to auscultation, no crackles or wheezes. Heart:  Normal rate and regular rhythm. S1 and S2 normal without gallop, murmur, click, rub or other extra sounds. Abdomen:  soft and no distention.   Msk:  normal ROM, no joint tenderness, and no joint swelling.   Extremities:  trace left pedal edema and trace right pedal edema.   Neurologic:  alert & oriented X3 and gait normal.     Impression & Recommendations:  Problem # 1:  PREVENTIVE HEALTH CARE (ICD-V70.0)  Colonoscopy: abnormal (09/30/2007) Td Booster: Tdap (03/31/2002)   Flu Vax: Historical (10/07/2010)   Chol: 148 (09/29/2010)   HDL: 49 (09/29/2010)   LDL: 71 (09/29/2010)   TG: 95 (09/16/2007) TSH: 2/13 (09/29/2010)   PSA: 0.4 (09/29/2010) Next Colonoscopy due:: 10/2012 (03/28/2009)  Discussed using sunscreen, use of alcohol, drug use, self testicular exam, routine dental care,  routine eye care, routine physical exam, seat belts, multiple vitamins, osteoporosis prevention, adequate calcium intake in diet, and recommendations for immunizations.  Discussed exercise and checking cholesterol.  Discussed gun safety, safe sex, and contraception. Also recommend checking PSA.  Problem # 2:  ATTENTION DEFICIT DISORDER, ADULT (ICD-314.00) refill  Complete Medication List: 1)  Aspirin 81 Mg Tbec (Aspirin) .... Take 1 tablet by mouth once a day 2)  Finasteride 5 Mg Tabs (Finasteride) .... Once daily 3)  Krill Oil 1000 Mg Caps (Krill oil) .Marland Kitchen.. 1 once daily 4)   Lamotrigine 25 Mg Tbdp (Lamotrigine) .... 3 in the am and2 in the afternoon 5)  Nexium 40 Mg Cpdr (Esomeprazole magnesium) .... Once daily 6)  Niacin 500 Mg Tabs (Niacin) .... Take 1 once a day 7)  Uroxatral 10 Mg Tb24 (Alfuzosin hcl) .... Take 1 tablet by mouth once a day 8)  Vytorin 10-20 Mg Tabs (Ezetimibe-simvastatin) .... Once daily 9)  Vyvanse 70 Mg Caps (Lisdexamfetamine dimesylate) .... One by mouth daily 10)  Sertraline Hcl 100 Mg Tabs (Sertraline hcl) .... One by mouth daily  ( generic) 11)  Verapamil Hcl Cr 240 Mg Cr-tabs (Verapamil hcl) .... One by mouth daily 12)  Hydrochlorothiazide 12.5 Mg Caps (Hydrochlorothiazide) .... One by mouth daily 13)  Ergocalciferol 50000 Unit Caps (Ergocalciferol) .... One by mouth weekly  Patient Instructions: 1)  Please schedule a follow-up appointment in 2 months. Prescriptions: VYVANSE 70 MG CAPS (LISDEXAMFETAMINE DIMESYLATE) one by mouth daily  #30 x 0   Entered and Authorized by:   Stacie Glaze MD   Signed by:   Stacie Glaze MD on 10/10/2010   Method used:   Print then Give to Patient   RxID:   [id number] VYVANSE 70 MG CAPS (LISDEXAMFETAMINE DIMESYLATE) one by mouth daily  #30 x 0   Entered and Authorized by:   Stacie Glaze MD   Signed by:   Stacie Glaze MD on 10/10/2010   Method used:   Print then Give to Patient   RxID:   [id number] ERGOCALCIFEROL 50000 UNIT CAPS (ERGOCALCIFEROL) one by mouth weekly  #5 x 11   Entered and Authorized by:   Stacie Glaze MD   Signed by:   Stacie Glaze MD on 10/10/2010   Method used:   Electronically to        [facility]* (retail)       [address]       [place], [place]  [id number]       Ph: [phone]       Fax: [phone]   RxID:   [id number]    Orders Added: 1)  Est. Patient 40-64 years [76160]   Immunization History:  Influenza Immunization History:    Influenza:  historical (10/07/2010)   Immunization History:  Influenza Immunization  History:    Influenza:  Historical (10/07/2010)

## 2011-01-28 ENCOUNTER — Other Ambulatory Visit: Payer: Self-pay | Admitting: Internal Medicine

## 2011-02-02 ENCOUNTER — Other Ambulatory Visit: Payer: Self-pay | Admitting: Internal Medicine

## 2011-02-02 DIAGNOSIS — K219 Gastro-esophageal reflux disease without esophagitis: Secondary | ICD-10-CM

## 2011-02-03 ENCOUNTER — Encounter: Payer: Self-pay | Admitting: Internal Medicine

## 2011-02-04 ENCOUNTER — Encounter: Payer: Self-pay | Admitting: Internal Medicine

## 2011-02-04 ENCOUNTER — Ambulatory Visit (INDEPENDENT_AMBULATORY_CARE_PROVIDER_SITE_OTHER): Payer: BC Managed Care – PPO | Admitting: Internal Medicine

## 2011-02-04 DIAGNOSIS — F988 Other specified behavioral and emotional disorders with onset usually occurring in childhood and adolescence: Secondary | ICD-10-CM

## 2011-02-04 DIAGNOSIS — N400 Enlarged prostate without lower urinary tract symptoms: Secondary | ICD-10-CM

## 2011-02-04 DIAGNOSIS — I10 Essential (primary) hypertension: Secondary | ICD-10-CM

## 2011-02-04 MED ORDER — METHYLPHENIDATE HCL ER (LA) 20 MG PO CP24: 20.0000 mg | ORAL_CAPSULE | Freq: Every day | ORAL | Status: DC

## 2011-02-04 MED ORDER — VITAMIN D 50 MCG (2000 UT) PO CAPS: 1.0000 | ORAL_CAPSULE | ORAL | Status: DC

## 2011-04-24 NOTE — Op Note (Signed)
[facility]  Patient:    Robert Mccall, BOQUET Visit Number: [id number] MRN: [id number]          Service Type: NES Location: [facility] Attending Physician:  Laqueta Jean Dictated by:   Vonzell Schlatter Patsi Sears, M.D. Proc. Date: 02/02/02 Admit Date:  02/02/2002 Discharge Date: 02/02/2002                             Operative Report  PREOPERATIVE DIAGNOSIS:  Right hydrocele.  POSTOPERATIVE DIAGNOSIS:  Right hydrocele.  OPERATION:  Right hydrocelectomy.  SURGEON:  Sigmund I. Patsi Sears, M.D.  ANESTHESIA:  General (LMA).  PREPARATION:  After appropriate preanesthesia, the patient is brought to the operating room and placed on the operating table in the dorsal supine position where a general LMA anesthetic was introduced.  He was then re-placed in the dorsal lithotomy position, and the pubis was prepped with Betadine solution and draped in the usual fashion.  DESCRIPTION OF PROCEDURE:  A 6 cm right hemiscrotal incision is made and the subcutaneous tissue dissected with the electrosurgical unit.  The testicle is delivered into the wound with blunt dissection with a wet sponge.  The tunica vaginalis is dissected, entered with the electrosurgical unit.  Approximately 50 cc of clear, straw-colored fluid is removed, and the remaining tunica vaginalis is incised longitudinally.  The testicle appeared to measure approximately 4 cm x 4 cm and had multiple stellate scarred areas, two of which were biopsied and sent to the laboratory for confirmation.  These areas were sutured with 4-0 Vicryl suture.  Following this, the hydrocele sac was sutured with 3-0 Vicryl suture around the back of the spermatic cord. Following this, the testicle was delivered into the scrotum.  No drain was felt necessary to be placed.  The wound was closed in two layers with 3-0 Vicryl suture.  Then 4-0 Vicryl suture was used to close the skin.  Collodium was placed on the wound.  The  patient was given IV Toradol as well as Marcaine in the incision.  He was awakened and taken to recovery room in good condition. Dictated by:   Vonzell Schlatter Patsi Sears, M.D. Attending Physician:  Laqueta Jean DD:  02/02/02 TD:  02/02/02 Job: [id number]

## 2011-04-28 ENCOUNTER — Other Ambulatory Visit: Payer: Self-pay | Admitting: Internal Medicine

## 2011-04-30 ENCOUNTER — Ambulatory Visit (INDEPENDENT_AMBULATORY_CARE_PROVIDER_SITE_OTHER): Payer: BC Managed Care – PPO | Admitting: Internal Medicine

## 2011-04-30 ENCOUNTER — Encounter: Payer: Self-pay | Admitting: Internal Medicine

## 2011-04-30 ENCOUNTER — Other Ambulatory Visit: Payer: Self-pay | Admitting: Internal Medicine

## 2011-04-30 VITALS — BP 124/80 | HR 72 | Temp 98.2°F | Resp 14 | Ht 65.0 in | Wt 176.0 lb

## 2011-04-30 DIAGNOSIS — J0110 Acute frontal sinusitis, unspecified: Secondary | ICD-10-CM

## 2011-04-30 DIAGNOSIS — I10 Essential (primary) hypertension: Secondary | ICD-10-CM

## 2011-04-30 DIAGNOSIS — F988 Other specified behavioral and emotional disorders with onset usually occurring in childhood and adolescence: Secondary | ICD-10-CM

## 2011-04-30 DIAGNOSIS — E785 Hyperlipidemia, unspecified: Secondary | ICD-10-CM

## 2011-04-30 DIAGNOSIS — K219 Gastro-esophageal reflux disease without esophagitis: Secondary | ICD-10-CM

## 2011-04-30 MED ORDER — HYDROCHLOROTHIAZIDE 12.5 MG PO CAPS: 12.5000 mg | ORAL_CAPSULE | Freq: Every day | ORAL | Status: DC

## 2011-04-30 MED ORDER — METHYLPHENIDATE HCL ER (LA) 20 MG PO CP24: 20.0000 mg | ORAL_CAPSULE | Freq: Every day | ORAL | Status: DC

## 2011-04-30 MED ORDER — FINASTERIDE 5 MG PO TABS: 5.0000 mg | ORAL_TABLET | Freq: Every day | ORAL | Status: DC

## 2011-04-30 MED ORDER — SIMVASTATIN 40 MG PO TABS: 40.0000 mg | ORAL_TABLET | Freq: Every evening | ORAL | Status: DC

## 2011-04-30 MED ORDER — OMEPRAZOLE 40 MG PO CPDR: 40.0000 mg | DELAYED_RELEASE_CAPSULE | Freq: Every day | ORAL | Status: DC

## 2011-04-30 NOTE — Progress Notes (Signed)
  Subjective:    Patient ID: Robert Mccall, male    DOB: [birth year], 60 y.o.   MRN: [id number]  HPI Patient brings with him labs from his office.  Done by the health nurse his hemoglobin A1c was 5.7 which is good news in light of some elevated fasting glucoses.  His vitamin D level was borderline low suggesting vitamin D supplementation may BE indicated his cholesterol was High with a total cholesterol 221 HDL of 50 and an LDL C1 41.   I have spent more than 30 minutes examining this patient face-to-face of which over half was spent in counseling  On these issues  Has increased low back pain and testicular pain.  Review of Systems  Constitutional: Negative for fever and fatigue.  HENT: Negative for hearing loss, congestion, neck pain and postnasal drip.   Eyes: Negative for discharge, redness and visual disturbance.  Respiratory: Negative for cough, shortness of breath and wheezing.   Cardiovascular: Negative for leg swelling.  Gastrointestinal: Negative for abdominal pain, constipation and abdominal distention.  Genitourinary: Negative for urgency and frequency.  Musculoskeletal: Negative for joint swelling and arthralgias.  Skin: Negative for color change and rash.  Neurological: Negative for weakness and light-headedness.  Hematological: Negative for adenopathy.  Psychiatric/Behavioral: Negative for behavioral problems.       Past Medical History  Diagnosis Date  . Hyperlipidemia   . ADHD (attention deficit hyperactivity disorder)   . Benign prostatic hypertrophy   . Hypertension   . Depression    Past Surgical History  Procedure Date  . Right testical hydroceole   . Orchiectomy 1970    left    reports that he has never smoked. He does not have any smokeless tobacco history on file. He reports that he drinks about 3 ounces of alcohol per week. He reports that he does not use illicit drugs. family history includes COPD in his mother; Heart disease in his mother; and  Macular degeneration in his father. No Known Allergies  Objective:   Physical Exam  Constitutional: He appears well-developed and well-nourished.  HENT:  Head: Normocephalic and atraumatic.  Eyes: Conjunctivae are normal. Pupils are equal, round, and reactive to light.  Neck: Normal range of motion. Neck supple.  Cardiovascular: Normal rate and regular rhythm.   Pulmonary/Chest: Effort normal and breath sounds normal.  Abdominal: Soft. Bowel sounds are normal.      prostate is normal in texture and size and no bogginess or tenderness detected    Assessment & Plan:  His Vytorin will be changed to Zocor for two-month trial with a lipid drawn at his office to a trial of generic Prilosec 40 In Pl. of his Nexium to see if this will control his GERD symptoms his prostate examination is normal and the low back and groin discomfort is most probably musculoskeletal

## 2011-04-30 NOTE — Patient Instructions (Signed)
We have changed you from Vytorin to Zocor and will monitor a lipid panel in 2 months our treatment goal is to have a total cholesterol less than 170 and an LDL C. less than 100 with triglycerides less than 150

## 2011-04-30 NOTE — Assessment & Plan Note (Signed)
The patient has not been consistent with the Vytorin and the results and his screening lab from work certainly indicate this with a total cholesterol 221 and an LDL C1 41 with a triglyceride of 152. we will urge compliance His workplace formulary allows him to get Zocor 40 and significant discount The try a two-month trial of Zocor he will get a lipid panel drawn at his work

## 2011-04-30 NOTE — Telephone Encounter (Signed)
Pt req 30 day supply for Methylphenidate 20 mg LA 3 scripts. Pt also Finasteride 5 mg to [facility] order fax # [phone].

## 2011-07-08 ENCOUNTER — Other Ambulatory Visit: Payer: Self-pay | Admitting: *Deleted

## 2011-07-08 MED ORDER — LAMOTRIGINE 25 MG PO TABS: ORAL_TABLET | ORAL | Status: DC

## 2011-08-03 ENCOUNTER — Ambulatory Visit (INDEPENDENT_AMBULATORY_CARE_PROVIDER_SITE_OTHER): Payer: BC Managed Care – PPO | Admitting: Internal Medicine

## 2011-08-03 VITALS — BP 130/76 | HR 76 | Temp 98.2°F | Resp 16 | Ht 65.0 in | Wt 170.0 lb

## 2011-08-03 DIAGNOSIS — E785 Hyperlipidemia, unspecified: Secondary | ICD-10-CM

## 2011-08-03 DIAGNOSIS — F909 Attention-deficit hyperactivity disorder, unspecified type: Secondary | ICD-10-CM

## 2011-08-03 DIAGNOSIS — N400 Enlarged prostate without lower urinary tract symptoms: Secondary | ICD-10-CM

## 2011-08-03 DIAGNOSIS — I10 Essential (primary) hypertension: Secondary | ICD-10-CM

## 2011-08-03 MED ORDER — METHYLPHENIDATE HCL ER (LA) 20 MG PO CP24: 20.0000 mg | ORAL_CAPSULE | Freq: Every day | ORAL | Status: DC

## 2011-08-03 NOTE — Progress Notes (Signed)
  Subjective:    Patient ID: Robert Mccall, male    DOB: March 29, 1951, 60 y.o.   MRN: [id number]  HPI follow up of lipids lab work was drawn at lab core and total cholesterol is down to 121 triglycerides were 85 HR cholesterol was 45 and LDL. was 25 He is on Zocor 40 with good results and no reported side effects Stable on ritalin for ADHD Blood pressure and cluster head aches stable  Review of Systems  Constitutional: Negative for fever and fatigue.  HENT: Negative for hearing loss, congestion, neck pain and postnasal drip.   Eyes: Negative for discharge, redness and visual disturbance.  Respiratory: Negative for cough, shortness of breath and wheezing.   Cardiovascular: Negative for leg swelling.  Gastrointestinal: Negative for abdominal pain, constipation and abdominal distention.  Genitourinary: Negative for urgency and frequency.  Musculoskeletal: Negative for joint swelling and arthralgias.  Skin: Negative for color change and rash.  Neurological: Negative for weakness and light-headedness.  Hematological: Negative for adenopathy.  Psychiatric/Behavioral: Negative for behavioral problems.   Past Medical History  Diagnosis Date  . Hyperlipidemia   . ADHD (attention deficit hyperactivity disorder)   . Benign prostatic hypertrophy   . Hypertension   . Depression    Past Surgical History  Procedure Date  . Right testical hydroceole   . Orchiectomy 1970    left    reports that he has never smoked. He does not have any smokeless tobacco history on file. He reports that he drinks about 3 ounces of alcohol per week. He reports that he does not use illicit drugs. family history includes COPD in his mother; Heart disease in his mother; and Macular degeneration in his father. No Known Allergies     Objective:   Physical Exam  Nursing note and vitals reviewed. Constitutional: He appears well-developed and well-nourished.  HENT:  Head: Normocephalic and atraumatic.  Eyes:  Conjunctivae are normal. Pupils are equal, round, and reactive to light.  Neck: Normal range of motion. Neck supple.  Cardiovascular: Normal rate and regular rhythm.   Pulmonary/Chest: Effort normal and breath sounds normal.  Abdominal: Soft. Bowel sounds are normal.          Assessment & Plan:  Lipids at goal with the statin and he tolerates it well. Continue statins consultation on diet and exercise.  Review of ADHD medications and refill of medications as appropriate discussion of anxiety and depression which seems to be well-controlled at this point with medications patient is tolerating the Lamictal well.  Review Gen. health goals. Review of prostate treatment for benign prostatic hypertrophy with Uroxatral and Proscar

## 2011-08-06 ENCOUNTER — Encounter: Payer: Self-pay | Admitting: Internal Medicine

## 2011-10-28 ENCOUNTER — Other Ambulatory Visit: Payer: Self-pay | Admitting: Internal Medicine

## 2011-11-03 ENCOUNTER — Ambulatory Visit (INDEPENDENT_AMBULATORY_CARE_PROVIDER_SITE_OTHER): Payer: BC Managed Care – PPO | Admitting: Internal Medicine

## 2011-11-03 ENCOUNTER — Encounter: Payer: Self-pay | Admitting: Internal Medicine

## 2011-11-03 VITALS — BP 138/80 | HR 76 | Temp 98.1°F | Resp 16 | Ht 64.0 in | Wt 170.0 lb

## 2011-11-03 DIAGNOSIS — K219 Gastro-esophageal reflux disease without esophagitis: Secondary | ICD-10-CM

## 2011-11-03 DIAGNOSIS — T887XXA Unspecified adverse effect of drug or medicament, initial encounter: Secondary | ICD-10-CM

## 2011-11-03 DIAGNOSIS — F909 Attention-deficit hyperactivity disorder, unspecified type: Secondary | ICD-10-CM

## 2011-11-03 MED ORDER — METHYLPHENIDATE HCL ER (LA) 20 MG PO CP24: 20.0000 mg | ORAL_CAPSULE | Freq: Every day | ORAL | Status: DC

## 2011-11-03 MED ORDER — LAMOTRIGINE 25 MG PO TABS: ORAL_TABLET | ORAL | Status: DC

## 2011-11-03 MED ORDER — ESOMEPRAZOLE MAGNESIUM 40 MG PO CPDR: 40.0000 mg | DELAYED_RELEASE_CAPSULE | Freq: Every day | ORAL | Status: DC

## 2011-11-03 MED ORDER — FINASTERIDE 5 MG PO TABS: 5.0000 mg | ORAL_TABLET | Freq: Every day | ORAL | Status: DC

## 2011-11-03 MED ORDER — SERTRALINE HCL 100 MG PO TABS: 100.0000 mg | ORAL_TABLET | Freq: Every day | ORAL | Status: DC

## 2011-11-03 NOTE — Patient Instructions (Signed)
The patient is instructed to continue all medications as prescribed. Schedule followup with check out clerk upon leaving the clinic  

## 2011-11-03 NOTE — Progress Notes (Signed)
Subjective:    Patient ID: Robert Mccall, male    DOB: 06-20-1951, 60 y.o.   MRN: [id number]  HPI Patient with hypertension and ADHD with excessive compulsive behavior presents for followup.  He is stable on Lamictal he has been on the ADHD medication and doing well this is improved his hypertension as well as his cluster headache pattern. He requires refills of the ritalin Stable lipids on zocar and niacin Stable HTN      Review of Systems  Constitutional: Negative for fever and fatigue.  HENT: Negative for hearing loss, congestion, neck pain and postnasal drip.   Eyes: Negative for discharge, redness and visual disturbance.  Respiratory: Negative for cough, shortness of breath and wheezing.   Cardiovascular: Negative for leg swelling.  Gastrointestinal: Negative for abdominal pain, constipation and abdominal distention.  Genitourinary: Negative for urgency and frequency.  Musculoskeletal: Negative for joint swelling and arthralgias.  Skin: Negative for color change and rash.  Neurological: Negative for weakness and light-headedness.  Hematological: Negative for adenopathy.  Psychiatric/Behavioral: Negative for behavioral problems.   Past Medical History  Diagnosis Date  . Hyperlipidemia   . ADHD (attention deficit hyperactivity disorder)   . Benign prostatic hypertrophy   . Hypertension   . Depression     History   Social History  . Marital Status: Single    Spouse Name: N/A    Number of Children: N/A  . Years of Education: N/A   Occupational History  . Not on file.   Social History Main Topics  . Smoking status: Never Smoker   . Smokeless tobacco: Not on file  . Alcohol Use: 3.0 oz/week    5 Glasses of wine per week  . Drug Use: No  . Sexually Active: Yes   Other Topics Concern  . Not on file   Social History Narrative  . No narrative on file    Past Surgical History  Procedure Date  . Right testical hydroceole   . Orchiectomy 1970    left     Family History  Problem Relation Age of Onset  . COPD Mother   . Heart disease Mother   . Macular degeneration Father     No Known Allergies  Current Outpatient Prescriptions on File Prior to Visit  Medication Sig Dispense Refill  . alfuzosin (UROXATRAL) 10 MG 24 hr tablet TAKE 1 TABLET ONCE A DAY  90 tablet  3  . aspirin 81 MG tablet Take 81 mg by mouth daily.        . Cholecalciferol (VITAMIN D) 2000 UNITS CAPS Take 1 capsule (2,000 Units total) by mouth 1 dose over 46 hours.  30 capsule  11  . ergocalciferol (VITAMIN D2) 50000 UNITS capsule Take 50,000 Units by mouth once a week.        . finasteride (PROSCAR) 5 MG tablet Take 1 tablet (5 mg total) by mouth daily.  90 tablet  3  . hydrochlorothiazide (,MICROZIDE/HYDRODIURIL,) 12.5 MG capsule Take 1 capsule (12.5 mg total) by mouth daily.  90 capsule  3  . KRILL OIL 1000 MG CAPS Take 1 capsule by mouth daily.        Marland Kitchen lamoTRIgine (LAMICTAL) 25 MG tablet 450Pt is to take 3 in am and 2 in pm  450 tablet  3  . methylphenidate (RITALIN LA) 20 MG 24 hr capsule Take 1 capsule (20 mg total) by mouth daily. May substitute for generic  30 capsule  0  . methylphenidate (RITALIN LA) 20 MG  24 hr capsule Take 1 capsule (20 mg total) by mouth daily. May substitute for generic  30 capsule  0  . methylphenidate (RITALIN LA) 20 MG 24 hr capsule Take 1 capsule (20 mg total) by mouth daily. May substitute for generic  30 capsule  0  . NEXIUM 40 MG capsule TAKE  1 CAPSULE  EVERY DAY  90 capsule  1  . niacin 500 MG tablet Take 500 mg by mouth daily with breakfast.        . sertraline (ZOLOFT) 100 MG tablet TAKE 1 TABLET DAILY  90 tablet  1  . simvastatin (ZOCOR) 40 MG tablet Take 1 tablet (40 mg total) by mouth every evening.  30 tablet  11  . verapamil (CALAN-SR) 240 MG CR tablet TAKE 1 TABLET ONCE A DAY  90 tablet  2    BP 138/80  Pulse 76  Temp 98.1 F (36.7 C)  Resp 16  Ht 5\' 4"  (1.626 m)  Wt 170 lb (77.111 kg)  BMI 29.18 kg/m2         Objective:   Physical Exam  Vitals reviewed. Constitutional: He appears well-developed and well-nourished.  HENT:  Head: Normocephalic and atraumatic.  Eyes: Conjunctivae are normal. Pupils are equal, round, and reactive to light.  Neck: Normal range of motion. Neck supple.  Cardiovascular: Normal rate and regular rhythm.   Pulmonary/Chest: Effort normal and breath sounds normal.  Abdominal: Soft. Bowel sounds are normal.          Assessment & Plan:  Refilled 3 months of the Ritalin LA.  Discussed continuing the Zocor and niacin.  Stable on hydrochlorothiazide for hypertension.

## 2012-02-02 ENCOUNTER — Encounter: Payer: Self-pay | Admitting: Internal Medicine

## 2012-02-02 ENCOUNTER — Ambulatory Visit (INDEPENDENT_AMBULATORY_CARE_PROVIDER_SITE_OTHER): Payer: BC Managed Care – PPO | Admitting: Internal Medicine

## 2012-02-02 VITALS — BP 130/80 | HR 76 | Temp 98.1°F | Resp 16 | Ht 64.0 in | Wt 172.0 lb

## 2012-02-02 DIAGNOSIS — I10 Essential (primary) hypertension: Secondary | ICD-10-CM

## 2012-02-02 DIAGNOSIS — F909 Attention-deficit hyperactivity disorder, unspecified type: Secondary | ICD-10-CM

## 2012-02-02 DIAGNOSIS — E785 Hyperlipidemia, unspecified: Secondary | ICD-10-CM

## 2012-02-02 DIAGNOSIS — N400 Enlarged prostate without lower urinary tract symptoms: Secondary | ICD-10-CM

## 2012-02-02 MED ORDER — METHYLPHENIDATE HCL ER (LA) 20 MG PO CP24: 20.0000 mg | ORAL_CAPSULE | Freq: Every day | ORAL | Status: DC

## 2012-02-02 MED ORDER — CIPROFLOXACIN HCL 500 MG PO TABS: 500.0000 mg | ORAL_TABLET | Freq: Two times a day (BID) | ORAL | Status: AC

## 2012-02-02 NOTE — Patient Instructions (Addendum)
The patient is instructed to continue all medications as prescribed. Schedule followup with check out clerk upon leaving the clinic  

## 2012-02-02 NOTE — Progress Notes (Signed)
Subjective:    Patient ID: Robert Mccall, male    DOB: 1951/07/13, 61 y.o.   MRN: [id number]  HPI The pt presents for ADD follow up Stable on medications   Review of Systems  Constitutional: Negative for fever and fatigue.  HENT: Negative for hearing loss, congestion, neck pain and postnasal drip.   Eyes: Negative for discharge, redness and visual disturbance.  Respiratory: Negative for cough, shortness of breath and wheezing.   Cardiovascular: Negative for leg swelling.  Gastrointestinal: Negative for abdominal pain, constipation and abdominal distention.  Genitourinary: Negative for urgency and frequency.  Musculoskeletal: Negative for joint swelling and arthralgias.  Skin: Negative for color change and rash.  Neurological: Negative for weakness and light-headedness.  Hematological: Negative for adenopathy.  Psychiatric/Behavioral: Negative for behavioral problems.       Past Medical History  Diagnosis Date  . Hyperlipidemia   . ADHD (attention deficit hyperactivity disorder)   . Benign prostatic hypertrophy   . Hypertension   . Depression     History   Social History  . Marital Status: Single    Spouse Name: N/A    Number of Children: N/A  . Years of Education: N/A   Occupational History  . Not on file.   Social History Main Topics  . Smoking status: Never Smoker   . Smokeless tobacco: Not on file  . Alcohol Use: 3.0 oz/week    5 Glasses of wine per week  . Drug Use: No  . Sexually Active: Yes   Other Topics Concern  . Not on file   Social History Narrative  . No narrative on file    Past Surgical History  Procedure Date  . Right testical hydroceole   . Orchiectomy 1970    left    Family History  Problem Relation Age of Onset  . COPD Mother   . Heart disease Mother   . Macular degeneration Father     No Known Allergies  Current Outpatient Prescriptions on File Prior to Visit  Medication Sig Dispense Refill  . alfuzosin (UROXATRAL) 10 MG  24 hr tablet TAKE 1 TABLET ONCE A DAY  90 tablet  3  . aspirin 81 MG tablet Take 81 mg by mouth daily.        . Cholecalciferol (VITAMIN D) 2000 UNITS CAPS Take 1 capsule (2,000 Units total) by mouth 1 dose over 46 hours.  30 capsule  11  . ergocalciferol (VITAMIN D2) 50000 UNITS capsule Take 50,000 Units by mouth once a week.        . esomeprazole (NEXIUM) 40 MG capsule Take 1 capsule (40 mg total) by mouth daily before breakfast.  90 capsule  3  . finasteride (PROSCAR) 5 MG tablet Take 1 tablet (5 mg total) by mouth daily.  90 tablet  3  . hydrochlorothiazide (,MICROZIDE/HYDRODIURIL,) 12.5 MG capsule Take 1 capsule (12.5 mg total) by mouth daily.  90 capsule  3  . KRILL OIL 1000 MG CAPS Take 1 capsule by mouth daily.        Marland Kitchen lamoTRIgine (LAMICTAL) 25 MG tablet 450Pt is to take 3 in am and 2 in pm  450 tablet  3  . methylphenidate (RITALIN LA) 20 MG 24 hr capsule Take 1 capsule (20 mg total) by mouth daily. May substitute for generic  30 capsule  0  . niacin 500 MG tablet Take 500 mg by mouth daily with breakfast.        . sertraline (ZOLOFT) 100 MG tablet  Take 1 tablet (100 mg total) by mouth daily.  90 tablet  3  . simvastatin (ZOCOR) 40 MG tablet Take 1 tablet (40 mg total) by mouth every evening.  30 tablet  11  . verapamil (CALAN-SR) 240 MG CR tablet TAKE 1 TABLET ONCE A DAY  90 tablet  2    BP 130/80  Pulse 76  Temp 98.1 F (36.7 C)  Resp 16  Ht 5\' 4"  (1.626 m)  Wt 172 lb (78.019 kg)  BMI 29.52 kg/m2    Objective:   Physical Exam  Constitutional: He appears well-developed and well-nourished.  HENT:  Head: Normocephalic and atraumatic.  Eyes: Conjunctivae are normal. Pupils are equal, round, and reactive to light.  Neck: Normal range of motion. Neck supple.  Cardiovascular: Normal rate and regular rhythm.   Pulmonary/Chest: Effort normal and breath sounds normal.  Abdominal: Soft. Bowel sounds are normal.          Assessment & Plan:  Continued prescription for her  ADD medications discuss timing of medications and medication side effects

## 2012-02-10 ENCOUNTER — Other Ambulatory Visit: Payer: Self-pay | Admitting: Internal Medicine

## 2012-04-08 ENCOUNTER — Other Ambulatory Visit: Payer: Self-pay | Admitting: Internal Medicine

## 2012-05-06 ENCOUNTER — Ambulatory Visit (INDEPENDENT_AMBULATORY_CARE_PROVIDER_SITE_OTHER): Payer: BC Managed Care – PPO | Admitting: Internal Medicine

## 2012-05-06 ENCOUNTER — Encounter: Payer: Self-pay | Admitting: Internal Medicine

## 2012-05-06 VITALS — BP 126/78 | HR 72 | Temp 98.2°F | Resp 16 | Ht 64.0 in | Wt 172.0 lb

## 2012-05-06 DIAGNOSIS — F909 Attention-deficit hyperactivity disorder, unspecified type: Secondary | ICD-10-CM

## 2012-05-06 DIAGNOSIS — Z23 Encounter for immunization: Secondary | ICD-10-CM

## 2012-05-06 DIAGNOSIS — Z0000 Encounter for general adult medical examination without abnormal findings: Secondary | ICD-10-CM

## 2012-05-06 MED ORDER — FINASTERIDE 5 MG PO TABS: 5.0000 mg | ORAL_TABLET | Freq: Every day | ORAL | Status: DC

## 2012-05-06 MED ORDER — METHYLPHENIDATE HCL ER (LA) 20 MG PO CP24: 20.0000 mg | ORAL_CAPSULE | Freq: Every day | ORAL | Status: DC

## 2012-05-06 NOTE — Patient Instructions (Addendum)
The patient is instructed to continue all medications as prescribed. Schedule followup with check out clerk upon leaving the clinic look up paleo diet  "practical paleo"

## 2012-05-06 NOTE — Progress Notes (Signed)
Subjective:    Patient ID: Robert Mccall, male    DOB: Jan 16, 1951, 61 y.o.   MRN: [id number]  HPI  patient is a 61 year old male who presents for her yearly physical examination his chronic problems with follow are hyperlipidemia, attention deficit disorder history of cluster headaches, chronic hypertension and benign prostatic hypertrophy he also has chronic back pain due to degenerative disc disease lumbosacral region.   Review of Systems  Constitutional: Negative for fever and fatigue.  HENT: Negative for hearing loss, congestion, neck pain and postnasal drip.   Eyes: Negative for discharge, redness and visual disturbance.  Respiratory: Negative for cough, shortness of breath and wheezing.   Cardiovascular: Negative for leg swelling.  Gastrointestinal: Negative for abdominal pain, constipation and abdominal distention.  Genitourinary: Negative for urgency and frequency.  Musculoskeletal: Negative for joint swelling and arthralgias.  Skin: Negative for color change and rash.  Neurological: Negative for weakness and light-headedness.  Hematological: Negative for adenopathy.  Psychiatric/Behavioral: Negative for behavioral problems.   Past Medical History  Diagnosis Date  . Hyperlipidemia   . ADHD (attention deficit hyperactivity disorder)   . Benign prostatic hypertrophy   . Hypertension   . Depression     History   Social History  . Marital Status: Single    Spouse Name: N/A    Number of Children: N/A  . Years of Education: N/A   Occupational History  . Not on file.   Social History Main Topics  . Smoking status: Never Smoker   . Smokeless tobacco: Not on file  . Alcohol Use: 3.0 oz/week    5 Glasses of wine per week  . Drug Use: No  . Sexually Active: Yes   Other Topics Concern  . Not on file   Social History Narrative  . No narrative on file    Past Surgical History  Procedure Date  . Right testical hydroceole   . Orchiectomy 1970    left    Family  History  Problem Relation Age of Onset  . COPD Mother   . Heart disease Mother   . Macular degeneration Father     No Known Allergies  Current Outpatient Prescriptions on File Prior to Visit  Medication Sig Dispense Refill  . alfuzosin (UROXATRAL) 10 MG 24 hr tablet TAKE 1 TABLET DAILY  90 tablet  2  . aspirin 81 MG tablet Take 81 mg by mouth daily.        . Cholecalciferol (VITAMIN D) 2000 UNITS CAPS Take 1 capsule (2,000 Units total) by mouth 1 dose over 46 hours.  30 capsule  11  . ergocalciferol (VITAMIN D2) 50000 UNITS capsule Take 50,000 Units by mouth once a week.        . hydrochlorothiazide (,MICROZIDE/HYDRODIURIL,) 12.5 MG capsule Take 1 capsule (12.5 mg total) by mouth daily.  90 capsule  3  . KRILL OIL 1000 MG CAPS Take 1 capsule by mouth daily.        Marland Kitchen lamoTRIgine (LAMICTAL) 25 MG tablet 450Pt is to take 3 in am and 2 in pm  450 tablet  3  . NEXIUM 40 MG capsule TAKE  1 CAPSULE  EVERY DAY  90 capsule  0  . niacin 500 MG tablet Take 500 mg by mouth daily with breakfast.        . sertraline (ZOLOFT) 100 MG tablet TAKE 1 TABLET DAILY  90 tablet  0  . Travoprost, BAK Free, (TRAVATAN) 0.004 % SOLN ophthalmic solution Place 1 drop into  the right eye at bedtime.      . verapamil (CALAN-SR) 240 MG CR tablet TAKE 1 TABLET ONCE A DAY  90 tablet  1  . DISCONTD: finasteride (PROSCAR) 5 MG tablet Take 1 tablet (5 mg total) by mouth daily.  90 tablet  3  . DISCONTD: methylphenidate (RITALIN LA) 20 MG 24 hr capsule Take 1 capsule (20 mg total) by mouth daily. May substitute for generic  30 capsule  0  . DISCONTD: simvastatin (ZOCOR) 40 MG tablet Take 1 tablet (40 mg total) by mouth every evening.  30 tablet  11    BP 126/78  Pulse 72  Temp 98.2 F (36.8 C)  Resp 16  Ht 5\' 4"  (1.626 m)  Wt 172 lb (78.019 kg)  BMI 29.52 kg/m2       Objective:   Physical Exam  Nursing note and vitals reviewed. Constitutional: He is oriented to person, place, and time. He appears well-developed  and well-nourished.  HENT:  Head: Normocephalic and atraumatic.  Eyes: Conjunctivae are normal. Pupils are equal, round, and reactive to light.  Neck: Normal range of motion. Neck supple.  Cardiovascular: Normal rate and regular rhythm.   Pulmonary/Chest: Effort normal and breath sounds normal.  Abdominal: Soft. Bowel sounds are normal.  Genitourinary:       Enlarged prostate  Musculoskeletal: Normal range of motion.  Neurological: He is alert and oriented to person, place, and time.  Skin: Skin is warm and dry.  Psychiatric: Judgment and thought content normal.          Assessment & Plan:   Patient presents for yearly preventative medicine examination.   all immunizations and health maintenance protocols were reviewed with the patient and they are up to date with these protocols.   screening laboratory values were reviewed with the patient including screening of hyperlipidemia PSA renal function and hepatic function.   There medications past medical history social history problem list and allergies were reviewed in detail.   Goals were established with regard to weight loss exercise diet in compliance with medications

## 2012-07-07 ENCOUNTER — Other Ambulatory Visit: Payer: Self-pay | Admitting: Internal Medicine

## 2012-08-15 ENCOUNTER — Ambulatory Visit (INDEPENDENT_AMBULATORY_CARE_PROVIDER_SITE_OTHER): Payer: BC Managed Care – PPO | Admitting: Internal Medicine

## 2012-08-15 ENCOUNTER — Encounter: Payer: Self-pay | Admitting: Internal Medicine

## 2012-08-15 ENCOUNTER — Other Ambulatory Visit: Payer: Self-pay | Admitting: *Deleted

## 2012-08-15 VITALS — BP 140/82 | HR 72 | Temp 98.6°F | Resp 16 | Ht 64.0 in | Wt 174.0 lb

## 2012-08-15 DIAGNOSIS — F909 Attention-deficit hyperactivity disorder, unspecified type: Secondary | ICD-10-CM

## 2012-08-15 DIAGNOSIS — E669 Obesity, unspecified: Secondary | ICD-10-CM

## 2012-08-15 DIAGNOSIS — Z23 Encounter for immunization: Secondary | ICD-10-CM

## 2012-08-15 DIAGNOSIS — I10 Essential (primary) hypertension: Secondary | ICD-10-CM

## 2012-08-15 MED ORDER — HYDROCHLOROTHIAZIDE 12.5 MG PO CAPS: 12.5000 mg | ORAL_CAPSULE | Freq: Every day | ORAL | Status: DC

## 2012-08-15 MED ORDER — METHYLPHENIDATE HCL ER (LA) 20 MG PO CP24: 20.0000 mg | ORAL_CAPSULE | Freq: Every day | ORAL | Status: DC

## 2012-08-15 NOTE — Progress Notes (Signed)
Subjective:    Patient ID: Robert Mccall, male    DOB: [birth year], 61 y.o.   MRN: [id number]  HPI    Review of Systems  Constitutional: Negative for fever and fatigue.  HENT: Negative for hearing loss, congestion, neck pain and postnasal drip.   Eyes: Negative for discharge, redness and visual disturbance.  Respiratory: Negative for cough, shortness of breath and wheezing.   Cardiovascular: Negative for leg swelling.  Gastrointestinal: Negative for abdominal pain, constipation and abdominal distention.  Genitourinary: Negative for urgency and frequency.  Musculoskeletal: Negative for joint swelling and arthralgias.  Skin: Negative for color change and rash.  Neurological: Negative for weakness and light-headedness.  Hematological: Negative for adenopathy.  Psychiatric/Behavioral: Negative for behavioral problems.    The patient is instructed to continue all medications as prescribed. Schedule followup with check out clerk upon leaving the clinic Past Medical History  Diagnosis Date  . Hyperlipidemia   . ADHD (attention deficit hyperactivity disorder)   . Benign prostatic hypertrophy   . Hypertension   . Depression     History   Social History  . Marital Status: Single    Spouse Name: N/A    Number of Children: N/A  . Years of Education: N/A   Occupational History  . Not on file.   Social History Main Topics  . Smoking status: Never Smoker   . Smokeless tobacco: Not on file  . Alcohol Use: 3.0 oz/week    5 Glasses of wine per week  . Drug Use: No  . Sexually Active: Yes   Other Topics Concern  . Not on file   Social History Narrative  . No narrative on file    Past Surgical History  Procedure Date  . Right testical hydroceole   . Orchiectomy 1970    left    Family History  Problem Relation Age of Onset  . COPD Mother   . Heart disease Mother   . Macular degeneration Father     No Known Allergies  Current Outpatient Prescriptions on File Prior  to Visit  Medication Sig Dispense Refill  . alfuzosin (UROXATRAL) 10 MG 24 hr tablet TAKE 1 TABLET DAILY  90 tablet  2  . aspirin 81 MG tablet Take 81 mg by mouth daily.        . Cholecalciferol (VITAMIN D) 2000 UNITS CAPS Take 1 capsule (2,000 Units total) by mouth 1 dose over 46 hours.  30 capsule  11  . ergocalciferol (VITAMIN D2) 50000 UNITS capsule Take 50,000 Units by mouth once a week.        . finasteride (PROSCAR) 5 MG tablet Take 1 tablet (5 mg total) by mouth daily.  90 tablet  3  . KRILL OIL 1000 MG CAPS Take 1 capsule by mouth daily.        Marland Kitchen lamoTRIgine (LAMICTAL) 25 MG tablet 450Pt is to take 3 in am and 2 in pm  450 tablet  3  . NEXIUM 40 MG capsule TAKE  1 CAPSULE  EVERY DAY  90 capsule  3  . niacin 500 MG tablet Take 500 mg by mouth daily with breakfast.        . sertraline (ZOLOFT) 100 MG tablet TAKE 1 TABLET DAILY  90 tablet  3  . simvastatin (ZOCOR) 40 MG tablet Take 40 mg by mouth every evening.      . Travoprost, BAK Free, (TRAVATAN) 0.004 % SOLN ophthalmic solution Place 1 drop into the right eye at bedtime.      Marland Kitchen  verapamil (CALAN-SR) 240 MG CR tablet TAKE 1 TABLET ONCE A DAY  90 tablet  1  . DISCONTD: hydrochlorothiazide (,MICROZIDE/HYDRODIURIL,) 12.5 MG capsule Take 1 capsule (12.5 mg total) by mouth daily.  90 capsule  3  . DISCONTD: methylphenidate (RITALIN LA) 20 MG 24 hr capsule Take 1 capsule (20 mg total) by mouth daily. May substitute for generic  30 capsule  0  . DISCONTD: methylphenidate (RITALIN LA) 20 MG 24 hr capsule Take 1 capsule (20 mg total) by mouth daily. May substitute for generic  30 capsule  0    BP 140/82  Pulse 72  Temp 98.6 F (37 C)  Resp 16  Ht 5\' 4"  (1.626 m)  Wt 174 lb (78.926 kg)  BMI 29.87 kg/m2       Objective:   Physical Exam  Constitutional: He appears well-developed and well-nourished.  HENT:  Head: Normocephalic and atraumatic.  Eyes: Conjunctivae are normal. Pupils are equal, round, and reactive to light.  Neck:  Normal range of motion. Neck supple.  Cardiovascular: Normal rate and regular rhythm.   Pulmonary/Chest: Effort normal and breath sounds normal.  Abdominal: Soft. Bowel sounds are normal.  Skin: Skin is warm and dry.          Assessment & Plan:  ADD follow up Diet  divcussed the role of gluten in  add Review PALEO DIET AND HAND OUT

## 2012-08-15 NOTE — Patient Instructions (Addendum)
Past Medical History  Diagnosis Date  . Hyperlipidemia   . ADHD (attention deficit hyperactivity disorder)   . Benign prostatic hypertrophy   . Hypertension   . Depression     History   Social History  . Marital Status: Single    Spouse Name: N/A    Number of Children: N/A  . Years of Education: N/A   Occupational History  . Not on file.   Social History Main Topics  . Smoking status: Never Smoker   . Smokeless tobacco: Not on file  . Alcohol Use: 3.0 oz/week    5 Glasses of wine per week  . Drug Use: No  . Sexually Active: Yes   Other Topics Concern  . Not on file   Social History Narrative  . No narrative on file   The patient is instructed to continue all medications as prescribed. Schedule followup with check out clerk upon leaving the clinic

## 2012-09-04 ENCOUNTER — Other Ambulatory Visit: Payer: Self-pay | Admitting: Internal Medicine

## 2012-10-04 ENCOUNTER — Encounter: Payer: Self-pay | Admitting: Gastroenterology

## 2012-10-26 ENCOUNTER — Telehealth: Payer: Self-pay | Admitting: Internal Medicine

## 2012-10-26 ENCOUNTER — Encounter: Payer: Self-pay | Admitting: Family

## 2012-10-26 ENCOUNTER — Ambulatory Visit (INDEPENDENT_AMBULATORY_CARE_PROVIDER_SITE_OTHER): Payer: BC Managed Care – PPO | Admitting: Family

## 2012-10-26 VITALS — BP 130/80 | HR 95 | Temp 98.1°F | Wt 184.0 lb

## 2012-10-26 DIAGNOSIS — F329 Major depressive disorder, single episode, unspecified: Secondary | ICD-10-CM

## 2012-10-26 DIAGNOSIS — I499 Cardiac arrhythmia, unspecified: Secondary | ICD-10-CM

## 2012-10-26 DIAGNOSIS — R5383 Other fatigue: Secondary | ICD-10-CM

## 2012-10-26 DIAGNOSIS — F988 Other specified behavioral and emotional disorders with onset usually occurring in childhood and adolescence: Secondary | ICD-10-CM

## 2012-10-26 DIAGNOSIS — R5381 Other malaise: Secondary | ICD-10-CM

## 2012-10-26 DIAGNOSIS — F32A Depression, unspecified: Secondary | ICD-10-CM

## 2012-10-26 NOTE — Progress Notes (Signed)
Subjective:    Patient ID: Robert Mccall, male    DOB: 07/03/1951, 61 y.o.   MRN: [id number]  HPI 61 year old white male, nonsmoker, patient of Dr. Lovell Sheehan is in today with complaints of an irregular heartbeat. Patient reports periodically have his blood pressure checked at work in today, the nurse reports him having an irregular heartbeat. Patient is unable to fill any chest pain, palpitations, or shortness of breath. However, patient stopped his lamictal, Ritalin, and Zoloft approximately one month ago. Patient's reason he is filling fatigue, however, he continues to feel fatigued despite stopping his medications. Reports working in a high stress job and does not feel like use any more anxious than usual. Denies any feelings of helplessness or hopelessness, thoughts of death or dying.   Review of Systems  Constitutional: Positive for fatigue.  HENT: Negative.   Eyes: Negative.   Respiratory: Negative.   Cardiovascular: Positive for palpitations. Negative for chest pain and leg swelling.  Gastrointestinal: Negative.   Genitourinary: Negative.   Musculoskeletal: Negative.   Skin: Negative.   Neurological: Negative.   Hematological: Negative.   Psychiatric/Behavioral: Negative for sleep disturbance and agitation. The patient is nervous/anxious.    Past Medical History  Diagnosis Date  . Hyperlipidemia   . ADHD (attention deficit hyperactivity disorder)   . Benign prostatic hypertrophy   . Hypertension   . Depression     History   Social History  . Marital Status: Single    Spouse Name: N/A    Number of Children: N/A  . Years of Education: N/A   Occupational History  . Not on file.   Social History Main Topics  . Smoking status: Never Smoker   . Smokeless tobacco: Not on file  . Alcohol Use: 3.0 oz/week    5 Glasses of wine per week  . Drug Use: No  . Sexually Active: Yes   Other Topics Concern  . Not on file   Social History Narrative  . No narrative on file     Past Surgical History  Procedure Date  . Right testical hydroceole   . Orchiectomy 1970    left    Family History  Problem Relation Age of Onset  . COPD Mother   . Heart disease Mother   . Macular degeneration Father     No Known Allergies  Current Outpatient Prescriptions on File Prior to Visit  Medication Sig Dispense Refill  . alfuzosin (UROXATRAL) 10 MG 24 hr tablet TAKE 1 TABLET DAILY  90 tablet  1  . aspirin 81 MG tablet Take 81 mg by mouth daily.        . Cholecalciferol (VITAMIN D) 2000 UNITS CAPS Take 1 capsule (2,000 Units total) by mouth 1 dose over 46 hours.  30 capsule  11  . ergocalciferol (VITAMIN D2) 50000 UNITS capsule Take 50,000 Units by mouth once a week.        . finasteride (PROSCAR) 5 MG tablet Take 1 tablet (5 mg total) by mouth daily.  90 tablet  3  . hydrochlorothiazide (MICROZIDE) 12.5 MG capsule Take 1 capsule (12.5 mg total) by mouth daily.  90 capsule  3  . KRILL OIL 1000 MG CAPS Take 1 capsule by mouth daily.        . methylphenidate (RITALIN LA) 20 MG 24 hr capsule Take 1 capsule (20 mg total) by mouth daily. May substitute for generic  30 capsule  0  . NEXIUM 40 MG capsule TAKE  1 CAPSULE  EVERY  DAY  90 capsule  3  . niacin 500 MG tablet Take 500 mg by mouth daily with breakfast.        . simvastatin (ZOCOR) 40 MG tablet Take 40 mg by mouth every evening.      . Travoprost, BAK Free, (TRAVATAN) 0.004 % SOLN ophthalmic solution Place 1 drop into the right eye at bedtime.      . verapamil (CALAN-SR) 240 MG CR tablet TAKE 1 TABLET ONCE A DAY  90 tablet  0  . lamoTRIgine (LAMICTAL) 25 MG tablet 450Pt is to take 3 in am and 2 in pm  450 tablet  3  . methylphenidate (RITALIN LA) 20 MG 24 hr capsule Take 1 capsule (20 mg total) by mouth daily. May substitute for generic  30 capsule  0  . sertraline (ZOLOFT) 100 MG tablet TAKE 1 TABLET DAILY  90 tablet  3    BP 130/80  Pulse 95  Temp 98.1 F (36.7 C) (Oral)  Wt 184 lb (83.462 kg)  SpO2  97%chart    Objective:   Physical Exam  Constitutional: He is oriented to person, place, and time. He appears well-developed and well-nourished.  HENT:  Right Ear: External ear normal.  Left Ear: External ear normal.  Nose: Nose normal.  Mouth/Throat: Oropharynx is clear and moist.  Neck: Normal range of motion. Neck supple. No thyromegaly present.  Cardiovascular: Normal rate, regular rhythm and normal heart sounds.        Upon listening to his pulse, patient may have a premature beats approximately every 10 beats.  Pulmonary/Chest: Effort normal and breath sounds normal.  Abdominal: Soft. Bowel sounds are normal. There is no tenderness. There is no rebound and no guarding.  Musculoskeletal: Normal range of motion.  Neurological: He is alert and oriented to person, place, and time.  Skin: Skin is warm and dry.  Psychiatric: He has a normal mood and affect.      EKG: Normal sinus rhythm, no ectopy, within normal limits    Assessment & Plan:  Assessment: Fatigue, attention deficit disorder, depression, medication noncompliance, premature atrial contractions-possible  Plan: Holter monitor study ordered. Stressed the importance of medication compliance and not discontinuing the medications without supervision and permission from his PCP. Since he is off of these medications, I will come off at this time. Lab sent to lab for to include testosterone, CBC, BMP, LFTs, TSH notify patient of the results. Encouraged him to go to the emergency department if he develops any chest pain or shortness of breath.

## 2012-10-26 NOTE — Patient Instructions (Addendum)
Premature Beats A premature beat is an extra heartbeat that happens earlier than normal. Premature beats are called premature atrial contractions (PACs) or premature ventricular contractions (PVCs) depending on the area of the heart where they start. CAUSES  Premature beats may be brought on by a variety of factors including:  Emotional stress.  Lack of sleep.  Caffeine.  Asthma medicines.  Stimulants.  Herbal teas.  Dietary supplements.  Alcohol. In most cases, premature beats are not dangerous and are not a sign of serious heart disease. Most patients evaluated for premature beats have completely normal heart function. Rarely, premature beats may be a sign of more significant heart problems or medical illness. SYMPTOMS  Premature beats may cause palpitations. This means you feel like your heart is skipping a beat or beating harder than usual. Sometimes, slight chest pain occurs with premature beats, lasting only a few seconds. This pain has been described as a "flopping" feeling inside the chest. In many cases, premature beats do not cause any symptoms and they are only detected when an electrocardiography test (EKG) or heart monitoring is performed. DIAGNOSIS  Your caregiver may run some tests to evaluate your heart such as an EKG or echocardiography. You may need to wear a portable heart monitor for several days to record the electrical activity of your heart. Blood testing may also be performed to check your electrolytes and thyroid function. TREATMENT  Premature beats usually go away with rest. If the problem continues, your caregiver will determine a treatment plan for you.  HOME CARE INSTRUCTIONS  Get plenty of rest over the next few days until your symptoms improve.  Avoid coffee, tea, alcohol, and soda (pop, cola).  Do not smoke. SEEK MEDICAL CARE IF:  Your symptoms continue after 1 to 2 days of rest.  You have new symptoms, such as chest pain or trouble  breathing. SEEK IMMEDIATE MEDICAL CARE IF:  You have severe chest pain or abdominal pain.  You have pain that radiates into the neck, arm, or jaw.  You faint or have extreme weakness.  You have shortness of breath.  Your heartbeat races for more than 5 seconds. MAKE SURE YOU:  Understand these instructions.  Will watch your condition.  Will get help right away if you are not doing well or get worse. Document Released: 12/31/2004 Document Revised: 02/15/2012 Document Reviewed: 07/27/2011 [employer] Patient Information 2013 [employer].  

## 2012-10-26 NOTE — Telephone Encounter (Signed)
No documentation required, closing encounter

## 2012-10-26 NOTE — Telephone Encounter (Signed)
Patient Information:  Caller Name: Arnet  Phone: [phone]  Patient: Robert Mccall, Robert Mccall  Gender: Male  DOB: April 01, 1951  Age: 61 Years  PCP: Darryll Capers (Adults only)   Symptoms  Reason For Call & Symptoms: irregular heart rate  Reviewed Health History In EMR: Yes  Reviewed Medications In EMR: Yes  Reviewed Allergies In EMR: Yes  Date of Onset of Symptoms: 10/26/2012  Guideline(s) Used:  Heart Rate and Heartbeat Questions  Disposition Per Guideline:   See Today in Office  Reason For Disposition Reached:   Skipped or extra beat(s) and occurs 4 or more times per minute  Advice Given:  N/A  Office Follow Up:  Does the office need to follow up with this patient?: No  Instructions For The Office: N/A  Appointment Scheduled:  10/26/2012 15:15:00  RN Note:  RN at work states that he has a regular, irregularity to his heartrate, skipping about q5th beat.  He stopped the Sertaline, Ritalin and Lamictal combination about 3 weeks ago do to fatigue.

## 2012-10-28 ENCOUNTER — Telehealth: Payer: Self-pay | Admitting: Family

## 2012-10-28 NOTE — Telephone Encounter (Signed)
Left message to notify pt labs normal 

## 2012-10-28 NOTE — Telephone Encounter (Signed)
Labs are normal.

## 2012-10-31 ENCOUNTER — Telehealth: Payer: Self-pay | Admitting: *Deleted

## 2012-10-31 NOTE — Telephone Encounter (Signed)
Left message for patient to call and schedule 48 hour holter monitor ordered by Dr.Campbell. Schedule monitor for the week of December 9th.

## 2012-11-02 ENCOUNTER — Encounter: Payer: Self-pay | Admitting: Internal Medicine

## 2012-11-09 ENCOUNTER — Encounter (INDEPENDENT_AMBULATORY_CARE_PROVIDER_SITE_OTHER): Payer: BC Managed Care – PPO

## 2012-11-09 DIAGNOSIS — R002 Palpitations: Secondary | ICD-10-CM

## 2012-11-09 DIAGNOSIS — I499 Cardiac arrhythmia, unspecified: Secondary | ICD-10-CM

## 2012-11-09 DIAGNOSIS — R5383 Other fatigue: Secondary | ICD-10-CM

## 2012-11-10 ENCOUNTER — Telehealth: Payer: Self-pay | Admitting: *Deleted

## 2012-11-10 NOTE — Telephone Encounter (Signed)
24 hr monitor applied on 11/09/12

## 2012-11-14 ENCOUNTER — Ambulatory Visit (INDEPENDENT_AMBULATORY_CARE_PROVIDER_SITE_OTHER): Payer: BC Managed Care – PPO | Admitting: Internal Medicine

## 2012-11-14 ENCOUNTER — Encounter: Payer: Self-pay | Admitting: Internal Medicine

## 2012-11-14 VITALS — BP 130/76 | HR 80 | Temp 98.2°F | Resp 16 | Ht 64.0 in | Wt 184.0 lb

## 2012-11-14 DIAGNOSIS — G44009 Cluster headache syndrome, unspecified, not intractable: Secondary | ICD-10-CM

## 2012-11-14 DIAGNOSIS — F329 Major depressive disorder, single episode, unspecified: Secondary | ICD-10-CM

## 2012-11-14 DIAGNOSIS — I10 Essential (primary) hypertension: Secondary | ICD-10-CM

## 2012-11-14 DIAGNOSIS — F988 Other specified behavioral and emotional disorders with onset usually occurring in childhood and adolescence: Secondary | ICD-10-CM

## 2012-11-14 DIAGNOSIS — R002 Palpitations: Secondary | ICD-10-CM

## 2012-11-14 DIAGNOSIS — R000 Tachycardia, unspecified: Secondary | ICD-10-CM

## 2012-11-14 MED ORDER — CITALOPRAM HYDROBROMIDE 40 MG PO TABS: 40.0000 mg | ORAL_TABLET | Freq: Every day | ORAL | Status: DC

## 2012-11-14 NOTE — Patient Instructions (Addendum)
Out with Celexa 20 mg by mouth daily which is a low dose. See how you're feeling on this medication after 2 weeks if he feels like her energy levels are not but not really need to be you can increase it to 40 mg.  If you feel like you're having attention deficit issues call the office and we will prescribe Strattera as adjuvant therapy to the antidepression If you feel like you're having a great deal of mood fluctuation or if you were having obsessive-compulsive behaviors or thoughts then we will think about resuming the Lamictal.  The Holter monitor did not reveal any abnormal arrhythmias

## 2012-11-14 NOTE — Progress Notes (Signed)
Subjective:    Patient ID: Robert Mccall, male    DOB: 08/14/1951, 61 y.o.   MRN: [id number]  HPI Is a 61 year old male he is followed for hyperlipidemia hypertension and adult attention deficit disorder and OCD. He was on a cocktail of medications that included 25 mg of lamical 100 mg of Zoloft and ritalin. He has weaned himself off his medications and was experiencing palpitations and extra heartbeats he had a Holter monitor which showed rare PVCs no runs no atrial fibrillation no supraventricular tachycardia.  His past medical history is significant for tachycardia that was why he was on a verapamil protocol for his cluster headaches   Review of Systems  Constitutional: Negative for fever and fatigue.  HENT: Negative for hearing loss, congestion, neck pain and postnasal drip.   Eyes: Negative for discharge, redness and visual disturbance.  Respiratory: Negative for cough, shortness of breath and wheezing.   Cardiovascular: Negative for leg swelling.  Gastrointestinal: Negative for abdominal pain, constipation and abdominal distention.  Genitourinary: Negative for urgency and frequency.  Musculoskeletal: Negative for joint swelling and arthralgias.  Skin: Negative for color change and rash.  Neurological: Negative for weakness and light-headedness.  Hematological: Negative for adenopathy.  Psychiatric/Behavioral: Negative for behavioral problems.   Past Medical History  Diagnosis Date  . Hyperlipidemia   . ADHD (attention deficit hyperactivity disorder)   . Benign prostatic hypertrophy   . Hypertension   . Depression     History   Social History  . Marital Status: Single    Spouse Name: N/A    Number of Children: N/A  . Years of Education: N/A   Occupational History  . Not on file.   Social History Main Topics  . Smoking status: Never Smoker   . Smokeless tobacco: Not on file  . Alcohol Use: 3.0 oz/week    5 Glasses of wine per week  . Drug Use: No  . Sexually  Active: Yes   Other Topics Concern  . Not on file   Social History Narrative  . No narrative on file    Past Surgical History  Procedure Date  . Right testical hydroceole   . Orchiectomy 1970    left    Family History  Problem Relation Age of Onset  . COPD Mother   . Heart disease Mother   . Macular degeneration Father     No Known Allergies  Current Outpatient Prescriptions on File Prior to Visit  Medication Sig Dispense Refill  . alfuzosin (UROXATRAL) 10 MG 24 hr tablet TAKE 1 TABLET DAILY  90 tablet  1  . aspirin 81 MG tablet Take 81 mg by mouth daily.        . Cholecalciferol (VITAMIN D) 2000 UNITS CAPS Take 1 capsule (2,000 Units total) by mouth 1 dose over 46 hours.  30 capsule  11  . ergocalciferol (VITAMIN D2) 50000 UNITS capsule Take 50,000 Units by mouth once a week.        . finasteride (PROSCAR) 5 MG tablet Take 1 tablet (5 mg total) by mouth daily.  90 tablet  3  . hydrochlorothiazide (MICROZIDE) 12.5 MG capsule Take 1 capsule (12.5 mg total) by mouth daily.  90 capsule  3  . KRILL OIL 1000 MG CAPS Take 1 capsule by mouth daily.        Marland Kitchen lamoTRIgine (LAMICTAL) 25 MG tablet 450Pt is to take 3 in am and 2 in pm  450 tablet  3  . NEXIUM 40 MG  capsule TAKE  1 CAPSULE  EVERY DAY  90 capsule  3  . niacin 500 MG tablet Take 500 mg by mouth daily with breakfast.        . sertraline (ZOLOFT) 100 MG tablet TAKE 1 TABLET DAILY  90 tablet  3  . simvastatin (ZOCOR) 40 MG tablet Take 40 mg by mouth every evening.      . Travoprost, BAK Free, (TRAVATAN) 0.004 % SOLN ophthalmic solution Place 1 drop into the right eye at bedtime.      . verapamil (CALAN-SR) 240 MG CR tablet TAKE 1 TABLET ONCE A DAY  90 tablet  0    BP 130/76  Pulse 80  Temp 98.2 F (36.8 C)  Resp 16  Ht 5\' 4"  (1.626 m)  Wt 184 lb (83.462 kg)  BMI 31.58 kg/m2        Objective:   Physical Exam  Nursing note and vitals reviewed. Constitutional: He appears well-developed and well-nourished.  HENT:   Head: Normocephalic and atraumatic.  Eyes: Conjunctivae normal are normal. Pupils are equal, round, and reactive to light.  Neck: Normal range of motion. Neck supple.  Cardiovascular: Normal rate and regular rhythm.   Pulmonary/Chest: Effort normal and breath sounds normal.  Abdominal: Soft. Bowel sounds are normal.          Assessment & Plan:  Tried Prozac, and Zoloft in the past and low dose Lamictal at 125- 75 mg  Trial of the celexa  Stay off the ritalin for now Consider stratera trail if we need ADD medications

## 2012-11-22 ENCOUNTER — Encounter: Payer: Self-pay | Admitting: Internal Medicine

## 2012-11-22 ENCOUNTER — Telehealth: Payer: Self-pay | Admitting: *Deleted

## 2012-11-22 MED ORDER — ATOMOXETINE HCL 40 MG PO CAPS: ORAL_CAPSULE | ORAL | Status: DC

## 2012-11-22 NOTE — Telephone Encounter (Signed)
Pt called stating he thinks he does need the strattera.  Saw dr Lovell Sheehan several weeks ago and he had stopped his ritalin.  IN dr Lovell Sheehan notes it stated he may need to start straterra, but didn't give a dosage.  DOes he need to come in?

## 2012-11-22 NOTE — Telephone Encounter (Signed)
Okay to start Strattera 40 mg #180;  one daily in the morning for 3 days then increased to a twice a day regimen; Follow Dr. Lovell Sheehan in 6 weeks

## 2012-12-05 ENCOUNTER — Other Ambulatory Visit: Payer: Self-pay | Admitting: *Deleted

## 2012-12-08 ENCOUNTER — Telehealth: Payer: Self-pay | Admitting: Internal Medicine

## 2012-12-08 ENCOUNTER — Other Ambulatory Visit: Payer: Self-pay | Admitting: *Deleted

## 2012-12-08 NOTE — Telephone Encounter (Signed)
Pharmacy calling because they have 2 rx: sertraline and Nexium. First, they discovered that pt is getting generic Celexa at a local phrm from a diff doc (they didn't know who). Also, there is a possible interaction w/Nexium & Celexa. Please call tham and reference Invoice# [id number] regarding these issues.

## 2012-12-09 NOTE — Telephone Encounter (Signed)
As long as there is no heart palpatations it is ok

## 2012-12-26 ENCOUNTER — Encounter: Payer: Self-pay | Admitting: Internal Medicine

## 2013-01-10 ENCOUNTER — Encounter: Payer: Self-pay | Admitting: Internal Medicine

## 2013-01-16 ENCOUNTER — Encounter: Payer: Self-pay | Admitting: Internal Medicine

## 2013-01-16 MED ORDER — FINASTERIDE 5 MG PO TABS: 5.0000 mg | ORAL_TABLET | Freq: Every day | ORAL | Status: DC

## 2013-02-13 ENCOUNTER — Ambulatory Visit (INDEPENDENT_AMBULATORY_CARE_PROVIDER_SITE_OTHER): Payer: PRIVATE HEALTH INSURANCE | Admitting: Internal Medicine

## 2013-02-13 ENCOUNTER — Encounter: Payer: Self-pay | Admitting: Internal Medicine

## 2013-02-13 VITALS — BP 140/94 | HR 88 | Temp 98.1°F | Wt 188.0 lb

## 2013-02-13 DIAGNOSIS — Z2911 Encounter for prophylactic immunotherapy for respiratory syncytial virus (RSV): Secondary | ICD-10-CM

## 2013-02-13 DIAGNOSIS — Z23 Encounter for immunization: Secondary | ICD-10-CM

## 2013-02-13 DIAGNOSIS — I10 Essential (primary) hypertension: Secondary | ICD-10-CM

## 2013-02-13 DIAGNOSIS — F988 Other specified behavioral and emotional disorders with onset usually occurring in childhood and adolescence: Secondary | ICD-10-CM

## 2013-02-13 DIAGNOSIS — R635 Abnormal weight gain: Secondary | ICD-10-CM

## 2013-02-13 NOTE — Addendum Note (Signed)
Addended by: Kern Reap B on: 02/13/2013 02:49 PM   Modules accepted: Orders

## 2013-02-13 NOTE — Progress Notes (Signed)
Subjective:    Patient ID: Robert Mccall, male    DOB: [birth year], 62 y.o.   MRN: [id number]  HPI  ADD follow up  Review of Systems  Constitutional: Negative for fever and fatigue.  HENT: Negative for hearing loss, congestion, neck pain and postnasal drip.   Eyes: Negative for discharge, redness and visual disturbance.  Respiratory: Negative for cough, shortness of breath and wheezing.   Cardiovascular: Negative for leg swelling.  Gastrointestinal: Negative for abdominal pain, constipation and abdominal distention.  Genitourinary: Negative for urgency and frequency.  Musculoskeletal: Negative for joint swelling and arthralgias.  Skin: Negative for color change and rash.  Neurological: Negative for weakness and light-headedness.  Hematological: Negative for adenopathy.  Psychiatric/Behavioral: Negative for behavioral problems.   Past Medical History  Diagnosis Date  . Hyperlipidemia   . ADHD (attention deficit hyperactivity disorder)   . Benign prostatic hypertrophy   . Hypertension   . Depression     History   Social History  . Marital Status: Single    Spouse Name: N/A    Number of Children: N/A  . Years of Education: N/A   Occupational History  . Not on file.   Social History Main Topics  . Smoking status: Never Smoker   . Smokeless tobacco: Not on file  . Alcohol Use: 3.0 oz/week    5 Glasses of wine per week  . Drug Use: No  . Sexually Active: Yes   Other Topics Concern  . Not on file   Social History Narrative  . No narrative on file    Past Surgical History  Procedure Laterality Date  . Right testical hydroceole    . Orchiectomy  1970    left    Family History  Problem Relation Age of Onset  . COPD Mother   . Heart disease Mother   . Macular degeneration Father     No Known Allergies  Current Outpatient Prescriptions on File Prior to Visit  Medication Sig Dispense Refill  . alfuzosin (UROXATRAL) 10 MG 24 hr tablet TAKE 1 TABLET DAILY   90 tablet  1  . aspirin 81 MG tablet Take 81 mg by mouth daily.        . Cholecalciferol (VITAMIN D) 2000 UNITS CAPS Take 1 capsule (2,000 Units total) by mouth 1 dose over 46 hours.  30 capsule  11  . ergocalciferol (VITAMIN D2) 50000 UNITS capsule Take 50,000 Units by mouth once a week.        . finasteride (PROSCAR) 5 MG tablet Take 1 tablet (5 mg total) by mouth daily.  90 tablet  3  . hydrochlorothiazide (MICROZIDE) 12.5 MG capsule Take 1 capsule (12.5 mg total) by mouth daily.  90 capsule  3  . KRILL OIL 1000 MG CAPS Take 1 capsule by mouth daily.        Marland Kitchen NEXIUM 40 MG capsule TAKE  1 CAPSULE  EVERY DAY  90 capsule  3  . niacin 500 MG tablet Take 500 mg by mouth daily with breakfast.        . simvastatin (ZOCOR) 40 MG tablet Take 40 mg by mouth every evening.      . Travoprost, BAK Free, (TRAVATAN) 0.004 % SOLN ophthalmic solution Place 1 drop into the right eye at bedtime.      . verapamil (CALAN-SR) 240 MG CR tablet TAKE 1 TABLET ONCE A DAY  90 tablet  0  . atomoxetine (STRATTERA) 40 MG capsule 1 daily for 3 days  then 1 bid  180 capsule  1  . citalopram (CELEXA) 40 MG tablet Take 1 tablet (40 mg total) by mouth daily.  30 tablet  3  . lamoTRIgine (LAMICTAL) 25 MG tablet 450Pt is to take 3 in am and 2 in pm  450 tablet  3   No current facility-administered medications on file prior to visit.    BP 140/94  Pulse 88  Temp(Src) 98.1 F (36.7 C) (Oral)  Wt 188 lb (85.276 kg)  BMI 32.25 kg/m2       Objective:   Physical Exam  Nursing note and vitals reviewed. Constitutional: He is oriented to person, place, and time. He appears well-developed and well-nourished.  HENT:  Head: Normocephalic and atraumatic.  Eyes: Conjunctivae are normal. Pupils are equal, round, and reactive to light.  Neck: Normal range of motion. Neck supple.  Cardiovascular: Normal rate and regular rhythm.   Pulmonary/Chest: Effort normal and breath sounds normal.  Abdominal: Soft. Bowel sounds are normal.   Neurological: He is alert and oriented to person, place, and time.  Psychiatric: He has a normal mood and affect. His behavior is normal.          Assessment & Plan:  Blood pressure Has gained weight  Off stratera Had felt that the stratera contributed to fatigue  Left rib pain with sneezing and cough Increased low back pain

## 2013-02-13 NOTE — Patient Instructions (Addendum)
Discussion of paleo diet  Weight loss

## 2013-02-27 ENCOUNTER — Other Ambulatory Visit: Payer: Self-pay | Admitting: Internal Medicine

## 2013-02-27 ENCOUNTER — Encounter: Payer: Self-pay | Admitting: Internal Medicine

## 2013-02-27 MED ORDER — VERAPAMIL HCL ER 240 MG PO TBCR: EXTENDED_RELEASE_TABLET | ORAL | Status: DC

## 2013-02-27 MED ORDER — SIMVASTATIN 40 MG PO TABS: 40.0000 mg | ORAL_TABLET | Freq: Every evening | ORAL | Status: DC

## 2013-03-09 ENCOUNTER — Encounter: Payer: Self-pay | Admitting: Internal Medicine

## 2013-03-09 ENCOUNTER — Other Ambulatory Visit: Payer: Self-pay | Admitting: Internal Medicine

## 2013-03-10 ENCOUNTER — Other Ambulatory Visit: Payer: Self-pay | Admitting: *Deleted

## 2013-03-10 MED ORDER — SIMVASTATIN 40 MG PO TABS: 40.0000 mg | ORAL_TABLET | Freq: Every evening | ORAL | Status: DC

## 2013-03-10 NOTE — Telephone Encounter (Signed)
done

## 2013-03-29 ENCOUNTER — Encounter: Payer: Self-pay | Admitting: Gastroenterology

## 2013-05-29 ENCOUNTER — Ambulatory Visit (INDEPENDENT_AMBULATORY_CARE_PROVIDER_SITE_OTHER)
Admission: RE | Admit: 2013-05-29 | Discharge: 2013-05-29 | Disposition: A | Discharge status: Home / Self Care | Payer: PRIVATE HEALTH INSURANCE | Source: Ambulatory Visit | Attending: Internal Medicine | Admitting: Internal Medicine

## 2013-05-29 ENCOUNTER — Encounter: Payer: Self-pay | Admitting: Internal Medicine

## 2013-05-29 ENCOUNTER — Ambulatory Visit (INDEPENDENT_AMBULATORY_CARE_PROVIDER_SITE_OTHER): Payer: PRIVATE HEALTH INSURANCE | Admitting: Internal Medicine

## 2013-05-29 VITALS — BP 110/70 | HR 72 | Temp 98.2°F | Resp 16 | Ht 64.0 in | Wt 186.0 lb

## 2013-05-29 DIAGNOSIS — F988 Other specified behavioral and emotional disorders with onset usually occurring in childhood and adolescence: Secondary | ICD-10-CM

## 2013-05-29 DIAGNOSIS — G8929 Other chronic pain: Secondary | ICD-10-CM

## 2013-05-29 DIAGNOSIS — M549 Dorsalgia, unspecified: Secondary | ICD-10-CM

## 2013-05-29 DIAGNOSIS — F329 Major depressive disorder, single episode, unspecified: Secondary | ICD-10-CM

## 2013-05-29 MED ORDER — METHYLPREDNISOLONE (PAK) 4 MG PO TABS: ORAL_TABLET | ORAL | Status: DC

## 2013-05-29 MED ORDER — LAMOTRIGINE 25 MG PO TABS: 25.0000 mg | ORAL_TABLET | Freq: Two times a day (BID) | ORAL | Status: DC

## 2013-05-29 NOTE — Progress Notes (Signed)
Subjective:    Patient ID: Robert Mccall, male    DOB: 1951-02-16, 62 y.o.   MRN: [id number]  HPI Word searchoing and "mis-identity" No memory disorders in family and low risk factors Increased low back pain with a hx of chronic pain and last MRI was 2008   Review of Systems  Constitutional: Negative for fever and fatigue.  HENT: Negative for hearing loss, congestion, neck pain and postnasal drip.   Eyes: Negative for discharge, redness and visual disturbance.  Respiratory: Negative for cough, shortness of breath and wheezing.   Cardiovascular: Negative for leg swelling.  Gastrointestinal: Negative for abdominal pain, constipation and abdominal distention.  Genitourinary: Negative for urgency and frequency.  Musculoskeletal: Negative for joint swelling and arthralgias.  Skin: Negative for color change and rash.  Neurological: Negative for weakness and light-headedness.  Hematological: Negative for adenopathy.  Psychiatric/Behavioral: Negative for behavioral problems.   Past Medical History  Diagnosis Date  . Hyperlipidemia   . ADHD (attention deficit hyperactivity disorder)   . Benign prostatic hypertrophy   . Hypertension   . Depression     History   Social History  . Marital Status: Single    Spouse Name: N/A    Number of Children: N/A  . Years of Education: N/A   Occupational History  . Not on file.   Social History Main Topics  . Smoking status: Never Smoker   . Smokeless tobacco: Not on file  . Alcohol Use: 3.0 oz/week    5 Glasses of wine per week  . Drug Use: No  . Sexually Active: Yes   Other Topics Concern  . Not on file   Social History Narrative  . No narrative on file    Past Surgical History  Procedure Laterality Date  . Right testical hydroceole    . Orchiectomy  1970    left    Family History  Problem Relation Age of Onset  . COPD Mother   . Heart disease Mother   . Macular degeneration Father     No Known Allergies  Current  Outpatient Prescriptions on File Prior to Visit  Medication Sig Dispense Refill  . aspirin 81 MG tablet Take 81 mg by mouth daily.        . Cholecalciferol (VITAMIN D) 2000 UNITS CAPS Take 1 capsule (2,000 Units total) by mouth 1 dose over 46 hours.  30 capsule  11  . citalopram (CELEXA) 40 MG tablet Take 1 tablet (40 mg total) by mouth daily.  30 tablet  3  . ergocalciferol (VITAMIN D2) 50000 UNITS capsule Take 50,000 Units by mouth once a week.        . finasteride (PROSCAR) 5 MG tablet Take 1 tablet (5 mg total) by mouth daily.  90 tablet  3  . hydrochlorothiazide (MICROZIDE) 12.5 MG capsule Take 1 capsule (12.5 mg total) by mouth daily.  90 capsule  3  . KRILL OIL 1000 MG CAPS Take 1 capsule by mouth daily.        Marland Kitchen NEXIUM 40 MG capsule TAKE  1 CAPSULE  EVERY DAY  90 capsule  3  . niacin 500 MG tablet Take 500 mg by mouth daily with breakfast.        . simvastatin (ZOCOR) 40 MG tablet Take 1 tablet (40 mg total) by mouth every evening.  90 tablet  3  . Travoprost, BAK Free, (TRAVATAN) 0.004 % SOLN ophthalmic solution Place 1 drop into the right eye at bedtime.      Marland Kitchen  verapamil (CALAN-SR) 240 MG CR tablet TAKE 1 TABLET ONCE A DAY  90 tablet  3  . alfuzosin (UROXATRAL) 10 MG 24 hr tablet TAKE 1 TABLET DAILY  90 tablet  0   No current facility-administered medications on file prior to visit.    BP 110/70  Pulse 72  Temp(Src) 98.2 F (36.8 C)  Resp 16  Ht 5\' 4"  (1.626 m)  Wt 186 lb (84.369 kg)  BMI 31.91 kg/m2       Objective:   Physical Exam  Constitutional: He appears well-developed and well-nourished.  HENT:  Head: Normocephalic and atraumatic.  Eyes: Conjunctivae are normal. Pupils are equal, round, and reactive to light.  Neck: Normal range of motion. Neck supple.  Cardiovascular: Normal rate and regular rhythm.   Pulmonary/Chest: Effort normal and breath sounds normal.  Abdominal: Soft. Bowel sounds are normal.  Psychiatric:  depression          Assessment &  Plan:  ADD with depression Periodic itching with anxiety Visual migraines Weight gain  Medrol for back and plain films with MRI to follow

## 2013-05-29 NOTE — Patient Instructions (Addendum)
After being on the lamictal for 2 weeks call to see if we need to add the adderal or ritalin  Call to report the effectiveness of the medroil

## 2013-06-12 ENCOUNTER — Encounter: Payer: Self-pay | Admitting: Internal Medicine

## 2013-06-12 ENCOUNTER — Other Ambulatory Visit: Payer: Self-pay | Admitting: *Deleted

## 2013-06-12 DIAGNOSIS — M5416 Radiculopathy, lumbar region: Secondary | ICD-10-CM

## 2013-06-12 MED ORDER — METHYLPHENIDATE HCL ER (LA) 20 MG PO CP24: 20.0000 mg | ORAL_CAPSULE | ORAL | Status: DC

## 2013-06-19 ENCOUNTER — Encounter: Payer: Self-pay | Admitting: Internal Medicine

## 2013-06-21 ENCOUNTER — Other Ambulatory Visit: Payer: PRIVATE HEALTH INSURANCE

## 2013-08-09 ENCOUNTER — Other Ambulatory Visit: Payer: Self-pay | Admitting: Internal Medicine

## 2013-08-10 ENCOUNTER — Other Ambulatory Visit: Payer: Self-pay | Admitting: *Deleted

## 2013-08-10 MED ORDER — ESOMEPRAZOLE MAGNESIUM 40 MG PO CPDR: DELAYED_RELEASE_CAPSULE | ORAL | Status: DC

## 2013-08-21 ENCOUNTER — Other Ambulatory Visit: Payer: Self-pay | Admitting: *Deleted

## 2013-08-21 ENCOUNTER — Encounter: Payer: Self-pay | Admitting: Internal Medicine

## 2013-08-21 DIAGNOSIS — R319 Hematuria, unspecified: Secondary | ICD-10-CM

## 2013-08-24 ENCOUNTER — Encounter: Payer: Self-pay | Admitting: Internal Medicine

## 2013-08-30 ENCOUNTER — Ambulatory Visit: Payer: PRIVATE HEALTH INSURANCE | Admitting: Internal Medicine

## 2013-08-31 ENCOUNTER — Ambulatory Visit (INDEPENDENT_AMBULATORY_CARE_PROVIDER_SITE_OTHER): Payer: PRIVATE HEALTH INSURANCE | Admitting: Internal Medicine

## 2013-08-31 ENCOUNTER — Encounter: Payer: Self-pay | Admitting: Internal Medicine

## 2013-08-31 ENCOUNTER — Ambulatory Visit: Payer: PRIVATE HEALTH INSURANCE | Admitting: Internal Medicine

## 2013-08-31 VITALS — BP 130/84 | HR 72 | Temp 98.2°F | Resp 16 | Ht 64.0 in | Wt 186.0 lb

## 2013-08-31 DIAGNOSIS — F988 Other specified behavioral and emotional disorders with onset usually occurring in childhood and adolescence: Secondary | ICD-10-CM

## 2013-08-31 DIAGNOSIS — R5383 Other fatigue: Secondary | ICD-10-CM

## 2013-08-31 DIAGNOSIS — R5381 Other malaise: Secondary | ICD-10-CM

## 2013-08-31 DIAGNOSIS — R7309 Other abnormal glucose: Secondary | ICD-10-CM

## 2013-08-31 DIAGNOSIS — R7303 Prediabetes: Secondary | ICD-10-CM

## 2013-08-31 DIAGNOSIS — Z23 Encounter for immunization: Secondary | ICD-10-CM

## 2013-08-31 DIAGNOSIS — R319 Hematuria, unspecified: Secondary | ICD-10-CM

## 2013-08-31 MED ORDER — METHYLPHENIDATE HCL ER (LA) 30 MG PO CP24: 30.0000 mg | ORAL_CAPSULE | ORAL | Status: DC

## 2013-08-31 MED ORDER — METHYLPHENIDATE HCL ER (LA) 20 MG PO CP24: 20.0000 mg | ORAL_CAPSULE | ORAL | Status: DC

## 2013-08-31 NOTE — Progress Notes (Signed)
Subjective:    Patient ID: Robert Mccall, male    DOB: 1951/03/10, 62 y.o.   MRN: [id number]  HPI Follow up for ADD Increased fatigue reviewed labs from work  Moderate evidence for prediabetes    Review of Systems  Constitutional: Negative for fever and fatigue.  HENT: Negative for hearing loss, congestion, neck pain and postnasal drip.   Eyes: Negative for discharge, redness and visual disturbance.  Respiratory: Negative for cough, shortness of breath and wheezing.   Cardiovascular: Negative for leg swelling.  Gastrointestinal: Negative for abdominal pain, constipation and abdominal distention.  Genitourinary: Negative for urgency and frequency.  Musculoskeletal: Negative for joint swelling and arthralgias.  Skin: Negative for color change and rash.  Neurological: Negative for weakness and light-headedness.  Hematological: Negative for adenopathy.  Psychiatric/Behavioral: Negative for behavioral problems.   Past Medical History  Diagnosis Date  . Hyperlipidemia   . ADHD (attention deficit hyperactivity disorder)   . Benign prostatic hypertrophy   . Hypertension   . Depression     History   Social History  . Marital Status: Single    Spouse Name: N/A    Number of Children: N/A  . Years of Education: N/A   Occupational History  . Not on file.   Social History Main Topics  . Smoking status: Never Smoker   . Smokeless tobacco: Not on file  . Alcohol Use: 3.0 oz/week    5 Glasses of wine per week  . Drug Use: No  . Sexual Activity: Yes   Other Topics Concern  . Not on file   Social History Narrative  . No narrative on file    Past Surgical History  Procedure Laterality Date  . Right testical hydroceole    . Orchiectomy  1970    left    Family History  Problem Relation Age of Onset  . COPD Mother   . Heart disease Mother   . Macular degeneration Father     No Known Allergies  Current Outpatient Prescriptions on File Prior to Visit  Medication  Sig Dispense Refill  . alfuzosin (UROXATRAL) 10 MG 24 hr tablet TAKE 1 TABLET DAILY  90 tablet  0  . aspirin 81 MG tablet Take 81 mg by mouth daily.        . Cholecalciferol (VITAMIN D) 2000 UNITS CAPS Take 1 capsule (2,000 Units total) by mouth 1 dose over 46 hours.  30 capsule  11  . ergocalciferol (VITAMIN D2) 50000 UNITS capsule Take 50,000 Units by mouth once a week.        . esomeprazole (NEXIUM) 40 MG capsule TAKE  1 CAPSULE  EVERY DAY  90 capsule  3  . finasteride (PROSCAR) 5 MG tablet Take 1 tablet (5 mg total) by mouth daily.  90 tablet  3  . hydrochlorothiazide (MICROZIDE) 12.5 MG capsule Take 1 capsule (12.5 mg total) by mouth daily.  90 capsule  3  . KRILL OIL 1000 MG CAPS Take 1 capsule by mouth daily.        Marland Kitchen lamoTRIgine (LAMICTAL) 25 MG tablet Take 1 tablet (25 mg total) by mouth 2 (two) times daily.  60 tablet  3  . niacin 500 MG tablet Take 500 mg by mouth daily with breakfast.        . simvastatin (ZOCOR) 40 MG tablet Take 1 tablet (40 mg total) by mouth every evening.  90 tablet  3  . Travoprost, BAK Free, (TRAVATAN) 0.004 % SOLN ophthalmic solution Place 1  drop into the right eye at bedtime.      . verapamil (CALAN-SR) 240 MG CR tablet TAKE 1 TABLET ONCE A DAY  90 tablet  3   No current facility-administered medications on file prior to visit.    BP 130/84  Pulse 72  Temp(Src) 98.2 F (36.8 C)  Resp 16  Ht 5\' 4"  (1.626 m)  Wt 186 lb (84.369 kg)  BMI 31.91 kg/m2       Objective:   Physical Exam  Constitutional: He appears well-developed and well-nourished.  HENT:  Head: Normocephalic and atraumatic.  Eyes: Conjunctivae are normal. Pupils are equal, round, and reactive to light.  Neck: Normal range of motion. Neck supple.  Cardiovascular: Normal rate and regular rhythm.   Pulmonary/Chest: Effort normal and breath sounds normal.  Abdominal: Soft. Bowel sounds are normal.          Assessment & Plan:  Para March percent of a bloody mucousy  discharge.  This was associated with a period of back pain prior to this event.  The differential diagnosis includes renal stones bladder abnormality or prostate abnormality.  He has a urology  Appointment for cystoscopy.  Patient has known low back arthritis and an MRI is planned she needs to document for this year we will schedule the MRI after the urology evaluation.  Patient has increasing fatigue with essentially normal blood work and has been on the same dose of Ritalin for some time we will titrate the dose of Ritalin and see if this addresses his overall fatigue of note his CBC and thyroid panel were normal

## 2013-08-31 NOTE — Patient Instructions (Signed)
The patient is instructed to continue all medications as prescribed. Schedule followup with check out clerk upon leaving the clinic  

## 2013-10-02 ENCOUNTER — Encounter: Payer: Self-pay | Admitting: Internal Medicine

## 2013-10-02 DIAGNOSIS — G8929 Other chronic pain: Secondary | ICD-10-CM

## 2013-10-02 DIAGNOSIS — T887XXA Unspecified adverse effect of drug or medicament, initial encounter: Secondary | ICD-10-CM

## 2013-10-04 ENCOUNTER — Other Ambulatory Visit (INDEPENDENT_AMBULATORY_CARE_PROVIDER_SITE_OTHER): Payer: PRIVATE HEALTH INSURANCE

## 2013-10-04 DIAGNOSIS — T887XXA Unspecified adverse effect of drug or medicament, initial encounter: Secondary | ICD-10-CM

## 2013-10-04 LAB — BASIC METABOLIC PANEL
CO2: 27 mEq/L (ref 19–32)
Glucose, Bld: 103 mg/dL — ABNORMAL HIGH (ref 70–99)
Potassium: 3.3 mEq/L — ABNORMAL LOW (ref 3.5–5.1)
Sodium: 137 mEq/L (ref 135–145)

## 2013-10-09 ENCOUNTER — Other Ambulatory Visit: Payer: PRIVATE HEALTH INSURANCE

## 2013-10-12 ENCOUNTER — Ambulatory Visit
Admission: RE | Admit: 2013-10-12 | Discharge: 2013-10-12 | Disposition: A | Discharge status: Home / Self Care | Payer: PRIVATE HEALTH INSURANCE | Source: Ambulatory Visit | Attending: Internal Medicine | Admitting: Internal Medicine

## 2013-10-12 DIAGNOSIS — G8929 Other chronic pain: Secondary | ICD-10-CM

## 2013-10-13 ENCOUNTER — Encounter: Payer: Self-pay | Admitting: Internal Medicine

## 2013-10-19 ENCOUNTER — Encounter: Payer: Self-pay | Admitting: Internal Medicine

## 2013-10-19 DIAGNOSIS — M5417 Radiculopathy, lumbosacral region: Secondary | ICD-10-CM

## 2013-12-12 ENCOUNTER — Other Ambulatory Visit: Payer: Self-pay | Admitting: *Deleted

## 2013-12-12 DIAGNOSIS — F988 Other specified behavioral and emotional disorders with onset usually occurring in childhood and adolescence: Secondary | ICD-10-CM

## 2013-12-12 DIAGNOSIS — F3289 Other specified depressive episodes: Secondary | ICD-10-CM

## 2013-12-12 DIAGNOSIS — F329 Major depressive disorder, single episode, unspecified: Secondary | ICD-10-CM

## 2013-12-12 MED ORDER — LAMOTRIGINE 25 MG PO TABS: 25.0000 mg | ORAL_TABLET | Freq: Two times a day (BID) | ORAL | Status: DC

## 2013-12-13 ENCOUNTER — Other Ambulatory Visit: Payer: Self-pay | Admitting: *Deleted

## 2013-12-13 MED ORDER — ESOMEPRAZOLE MAGNESIUM 40 MG PO CPDR: DELAYED_RELEASE_CAPSULE | ORAL | Status: DC

## 2013-12-13 MED ORDER — ALFUZOSIN HCL ER 10 MG PO TB24: ORAL_TABLET | ORAL | Status: DC

## 2013-12-13 MED ORDER — FINASTERIDE 5 MG PO TABS: 5.0000 mg | ORAL_TABLET | Freq: Every day | ORAL | Status: DC

## 2013-12-13 MED ORDER — VERAPAMIL HCL ER 240 MG PO TBCR: EXTENDED_RELEASE_TABLET | ORAL | Status: DC

## 2013-12-15 ENCOUNTER — Encounter: Payer: Self-pay | Admitting: Internal Medicine

## 2013-12-15 ENCOUNTER — Other Ambulatory Visit: Payer: Self-pay | Admitting: *Deleted

## 2013-12-15 MED ORDER — ESOMEPRAZOLE MAGNESIUM 40 MG PO CPDR: DELAYED_RELEASE_CAPSULE | ORAL | Status: DC

## 2013-12-15 MED ORDER — ALFUZOSIN HCL ER 10 MG PO TB24: ORAL_TABLET | ORAL | Status: DC

## 2013-12-15 MED ORDER — FINASTERIDE 5 MG PO TABS: 5.0000 mg | ORAL_TABLET | Freq: Every day | ORAL | Status: DC

## 2013-12-18 ENCOUNTER — Other Ambulatory Visit: Payer: Self-pay | Admitting: *Deleted

## 2013-12-19 ENCOUNTER — Other Ambulatory Visit: Payer: Self-pay | Admitting: *Deleted

## 2013-12-19 MED ORDER — FINASTERIDE 5 MG PO TABS: 5.0000 mg | ORAL_TABLET | Freq: Every day | ORAL | Status: DC

## 2013-12-19 MED ORDER — VERAPAMIL HCL ER 240 MG PO TBCR: EXTENDED_RELEASE_TABLET | ORAL | Status: DC

## 2013-12-19 MED ORDER — ESOMEPRAZOLE MAGNESIUM 40 MG PO CPDR: DELAYED_RELEASE_CAPSULE | ORAL | Status: DC

## 2013-12-19 MED ORDER — ALFUZOSIN HCL ER 10 MG PO TB24: ORAL_TABLET | ORAL | Status: DC

## 2013-12-21 ENCOUNTER — Other Ambulatory Visit: Payer: Self-pay | Admitting: *Deleted

## 2013-12-21 ENCOUNTER — Telehealth: Payer: Self-pay | Admitting: Internal Medicine

## 2013-12-21 NOTE — Telephone Encounter (Addendum)
Pt needs new rxs  on nexium 40 mg#90,verapamil 240 mg #90,finasterinde 5 mg#90 and alfuzosine 10 mg #90 all w/refills sent to [facility]

## 2013-12-21 NOTE — Telephone Encounter (Signed)
Called and talked with them after sending refills 5 different times

## 2013-12-29 ENCOUNTER — Encounter: Payer: Self-pay | Admitting: Internal Medicine

## 2013-12-29 ENCOUNTER — Other Ambulatory Visit: Payer: Self-pay | Admitting: *Deleted

## 2013-12-29 MED ORDER — VERAPAMIL HCL ER 240 MG PO TBCR: EXTENDED_RELEASE_TABLET | ORAL | Status: DC

## 2014-01-15 ENCOUNTER — Encounter: Payer: Self-pay | Admitting: Internal Medicine

## 2014-01-15 ENCOUNTER — Ambulatory Visit (INDEPENDENT_AMBULATORY_CARE_PROVIDER_SITE_OTHER): Payer: PRIVATE HEALTH INSURANCE | Admitting: Internal Medicine

## 2014-01-15 VITALS — BP 120/78 | HR 80 | Temp 98.2°F | Resp 16 | Ht 64.0 in | Wt 168.0 lb

## 2014-01-15 DIAGNOSIS — R7309 Other abnormal glucose: Secondary | ICD-10-CM

## 2014-01-15 DIAGNOSIS — R319 Hematuria, unspecified: Secondary | ICD-10-CM

## 2014-01-15 DIAGNOSIS — R7303 Prediabetes: Secondary | ICD-10-CM

## 2014-01-15 DIAGNOSIS — G44009 Cluster headache syndrome, unspecified, not intractable: Secondary | ICD-10-CM

## 2014-01-15 DIAGNOSIS — M5137 Other intervertebral disc degeneration, lumbosacral region: Secondary | ICD-10-CM

## 2014-01-15 DIAGNOSIS — I10 Essential (primary) hypertension: Secondary | ICD-10-CM

## 2014-01-15 MED ORDER — METHYLPHENIDATE HCL ER (LA) 30 MG PO CP24: 30.0000 mg | ORAL_CAPSULE | ORAL | Status: DC

## 2014-01-15 NOTE — Patient Instructions (Signed)
The patient is instructed to continue all medications as prescribed. Schedule followup with check out clerk upon leaving the clinic  

## 2014-01-15 NOTE — Progress Notes (Signed)
Pre visit review using our clinic review tool, if applicable. No additional management support is needed unless otherwise documented below in the visit note. 

## 2014-01-15 NOTE — Progress Notes (Signed)
Subjective:    Patient ID: Robert Mccall, male    DOB: [birth year], 63 y.o.   MRN: [id number]  Hypertension Pertinent negatives include no neck pain or shortness of breath.  Hyperlipidemia Associated symptoms include myalgias. Pertinent negatives include no shortness of breath.  Benign Prostatic Hypertrophy Irritative symptoms include urgency.    Had lumbar radiculopathy he was referred to neurosurgery.  MRI showed disease but the upper operative intervention was delayed.  Gabapentin was used to control pain and has been successful.  Review of Systems  Constitutional: Negative for fever and fatigue.  HENT: Negative for congestion, hearing loss and postnasal drip.   Eyes: Negative for discharge, redness and visual disturbance.  Respiratory: Negative for cough, shortness of breath and wheezing.   Cardiovascular: Negative for leg swelling.  Gastrointestinal: Negative for abdominal pain, constipation and abdominal distention.  Genitourinary: Positive for urgency.  Musculoskeletal: Positive for back pain, joint swelling and myalgias. Negative for arthralgias and neck pain.  Skin: Negative for color change and rash.  Neurological: Negative for weakness and light-headedness.  Hematological: Negative for adenopathy.  Psychiatric/Behavioral: Negative for behavioral problems.   Past Medical History  Diagnosis Date  . Hyperlipidemia   . ADHD (attention deficit hyperactivity disorder)   . Benign prostatic hypertrophy   . Hypertension   . Depression     History   Social History  . Marital Status: Single    Spouse Name: N/A    Number of Children: N/A  . Years of Education: N/A   Occupational History  . Not on file.   Social History Main Topics  . Smoking status: Never Smoker   . Smokeless tobacco: Not on file  . Alcohol Use: 3.0 oz/week    5 Glasses of wine per week  . Drug Use: No  . Sexual Activity: Yes   Other Topics Concern  . Not on file   Social History  Narrative  . No narrative on file    Past Surgical History  Procedure Laterality Date  . Right testical hydroceole    . Orchiectomy  1970    left    Family History  Problem Relation Age of Onset  . COPD Mother   . Heart disease Mother   . Macular degeneration Father     No Known Allergies  Current Outpatient Prescriptions on File Prior to Visit  Medication Sig Dispense Refill  . alfuzosin (UROXATRAL) 10 MG 24 hr tablet TAKE 1 TABLET DAILY  90 tablet  3  . aspirin 81 MG tablet Take 81 mg by mouth daily.        . Cholecalciferol (VITAMIN D) 2000 UNITS CAPS Take 1 capsule (2,000 Units total) by mouth 1 dose over 46 hours.  30 capsule  11  . esomeprazole (NEXIUM) 40 MG capsule TAKE  1 CAPSULE  EVERY DAY  90 capsule  3  . finasteride (PROSCAR) 5 MG tablet Take 1 tablet (5 mg total) by mouth daily.  90 tablet  3  . hydrochlorothiazide (MICROZIDE) 12.5 MG capsule Take 1 capsule (12.5 mg total) by mouth daily.  90 capsule  3  . KRILL OIL 1000 MG CAPS Take 1 capsule by mouth daily.        Marland Kitchen lamoTRIgine (LAMICTAL) 25 MG tablet Take 1 tablet (25 mg total) by mouth 2 (two) times daily.  60 tablet  6  . methylphenidate (RITALIN LA) 30 MG 24 hr capsule Take 1 capsule (30 mg total) by mouth every morning.  30 capsule  0  .  niacin 500 MG tablet Take 500 mg by mouth daily with breakfast.        . simvastatin (ZOCOR) 40 MG tablet Take 1 tablet (40 mg total) by mouth every evening.  90 tablet  3  . Travoprost, BAK Free, (TRAVATAN) 0.004 % SOLN ophthalmic solution Place 1 drop into the right eye at bedtime.      . verapamil (CALAN-SR) 240 MG CR tablet TAKE 1 TABLET ONCE A DAY  90 tablet  3   No current facility-administered medications on file prior to visit.    BP 120/78  Pulse 80  Temp(Src) 98.2 F (36.8 C)  Resp 16  Ht 5\' 4"  (1.626 m)  Wt 168 lb (76.204 kg)  BMI 28.82 kg/m2       Objective:   Physical Exam  Nursing note and vitals reviewed. Constitutional: He appears  well-developed and well-nourished.  HENT:  Head: Normocephalic and atraumatic.  Eyes: Conjunctivae are normal. Pupils are equal, round, and reactive to light.  Neck: Normal range of motion. Neck supple.  Cardiovascular: Normal rate and regular rhythm.   Pulmonary/Chest: Effort normal and breath sounds normal.  Abdominal: Soft. Bowel sounds are normal.          Assessment & Plan:  Solid tumor vs cyst on right kidney with 6 month follow up planned for CT  Followed at [facility] urology  reminder has been placed in the EMR for a CT scan in April of 2015  Discussion of cryo treatment if the mass has changed  Mostly good days

## 2014-01-16 ENCOUNTER — Telehealth: Payer: Self-pay | Admitting: Internal Medicine

## 2014-01-16 NOTE — Telephone Encounter (Signed)
Relevant patient education assigned to patient using Emmi. ° °

## 2014-04-04 ENCOUNTER — Other Ambulatory Visit: Payer: Self-pay | Admitting: Internal Medicine

## 2014-04-04 DIAGNOSIS — R319 Hematuria, unspecified: Secondary | ICD-10-CM

## 2014-04-04 DIAGNOSIS — R7303 Prediabetes: Secondary | ICD-10-CM

## 2014-04-04 MED ORDER — METHYLPHENIDATE HCL ER (LA) 30 MG PO CP24: 30.0000 mg | ORAL_CAPSULE | ORAL | Status: DC

## 2014-04-09 ENCOUNTER — Telehealth: Payer: Self-pay | Admitting: *Deleted

## 2014-04-09 NOTE — Telephone Encounter (Signed)
Mr. Hobby sent a email under his father chart [facility] Phineas Douglas) wanting to know will you take him on as a new patient. He current md is Dr. [name], and he will no longer be seeing pt after July. pls advise...Robert Mccall

## 2014-04-09 NOTE — Telephone Encounter (Signed)
Yes that is fine  thanks

## 2014-04-09 NOTE — Telephone Encounter (Signed)
Notified pt with md response. Transferred to schedulers to get set-up...Robert Mccall

## 2014-05-16 ENCOUNTER — Encounter: Payer: Self-pay | Admitting: Internal Medicine

## 2014-05-17 ENCOUNTER — Telehealth: Payer: Self-pay | Admitting: *Deleted

## 2014-05-17 DIAGNOSIS — Z125 Encounter for screening for malignant neoplasm of prostate: Secondary | ICD-10-CM

## 2014-05-17 DIAGNOSIS — Z0000 Encounter for general adult medical examination without abnormal findings: Secondary | ICD-10-CM

## 2014-05-17 NOTE — Telephone Encounter (Signed)
Sent email wanting cpx labs done prior to appt. Pt has [facility] inform pt will enter standard cpx labs only...Robert Mccall

## 2014-07-17 ENCOUNTER — Telehealth: Payer: Self-pay | Admitting: Internal Medicine

## 2014-07-17 DIAGNOSIS — F3289 Other specified depressive episodes: Secondary | ICD-10-CM

## 2014-07-17 DIAGNOSIS — F988 Other specified behavioral and emotional disorders with onset usually occurring in childhood and adolescence: Secondary | ICD-10-CM

## 2014-07-17 DIAGNOSIS — F329 Major depressive disorder, single episode, unspecified: Secondary | ICD-10-CM

## 2014-07-17 MED ORDER — LAMOTRIGINE 25 MG PO TABS: 25.0000 mg | ORAL_TABLET | Freq: Two times a day (BID) | ORAL | Status: DC

## 2014-07-17 NOTE — Telephone Encounter (Signed)
rx sent in electronically 

## 2014-07-17 NOTE — Telephone Encounter (Signed)
[facility] [place], [address] RD. Is requesting re-fill on lamoTRIgine (LAMICTAL) 25 MG tablet

## 2014-07-18 ENCOUNTER — Other Ambulatory Visit: Payer: Self-pay | Admitting: Internal Medicine

## 2014-07-18 DIAGNOSIS — R7303 Prediabetes: Secondary | ICD-10-CM

## 2014-07-18 DIAGNOSIS — R319 Hematuria, unspecified: Secondary | ICD-10-CM

## 2014-07-23 ENCOUNTER — Encounter: Payer: Self-pay | Admitting: Internal Medicine

## 2014-07-25 MED ORDER — METHYLPHENIDATE HCL ER (LA) 30 MG PO CP24: 30.0000 mg | ORAL_CAPSULE | ORAL | Status: DC

## 2014-07-25 NOTE — Telephone Encounter (Signed)
Dr. [name] signed one 30 day supply of medication.  Left a message for pt making aware.  Mychart message sent as well.

## 2014-08-03 ENCOUNTER — Other Ambulatory Visit: Payer: PRIVATE HEALTH INSURANCE

## 2014-08-09 ENCOUNTER — Encounter: Payer: Self-pay | Admitting: Internal Medicine

## 2014-08-09 ENCOUNTER — Other Ambulatory Visit (INDEPENDENT_AMBULATORY_CARE_PROVIDER_SITE_OTHER): Payer: PRIVATE HEALTH INSURANCE

## 2014-08-09 DIAGNOSIS — Z0000 Encounter for general adult medical examination without abnormal findings: Secondary | ICD-10-CM

## 2014-08-09 DIAGNOSIS — F3289 Other specified depressive episodes: Secondary | ICD-10-CM

## 2014-08-09 DIAGNOSIS — R319 Hematuria, unspecified: Secondary | ICD-10-CM

## 2014-08-09 DIAGNOSIS — F988 Other specified behavioral and emotional disorders with onset usually occurring in childhood and adolescence: Secondary | ICD-10-CM

## 2014-08-09 DIAGNOSIS — Z125 Encounter for screening for malignant neoplasm of prostate: Secondary | ICD-10-CM

## 2014-08-09 DIAGNOSIS — R7303 Prediabetes: Secondary | ICD-10-CM

## 2014-08-09 DIAGNOSIS — F329 Major depressive disorder, single episode, unspecified: Secondary | ICD-10-CM

## 2014-08-09 LAB — HEPATIC FUNCTION PANEL
ALT: 28 U/L (ref 0–53)
AST: 30 U/L (ref 0–37)
Albumin: 4.1 g/dL (ref 3.5–5.2)
Alkaline Phosphatase: 55 U/L (ref 39–117)
BILIRUBIN DIRECT: 0.1 mg/dL (ref 0.0–0.3)
Total Bilirubin: 0.7 mg/dL (ref 0.2–1.2)
Total Protein: 7.1 g/dL (ref 6.0–8.3)

## 2014-08-09 LAB — CBC WITH DIFFERENTIAL/PLATELET
BASOS PCT: 0.3 % (ref 0.0–3.0)
Basophils Absolute: 0 10*3/uL (ref 0.0–0.1)
EOS PCT: 2.1 % (ref 0.0–5.0)
Eosinophils Absolute: 0.1 10*3/uL (ref 0.0–0.7)
HCT: 45.7 % (ref 39.0–52.0)
HEMOGLOBIN: 15.6 g/dL (ref 13.0–17.0)
LYMPHS PCT: 36 % (ref 12.0–46.0)
Lymphs Abs: 2.5 10*3/uL (ref 0.7–4.0)
MCHC: 34.1 g/dL (ref 30.0–36.0)
MCV: 90.2 fl (ref 78.0–100.0)
MONOS PCT: 11.6 % (ref 3.0–12.0)
Monocytes Absolute: 0.8 10*3/uL (ref 0.1–1.0)
NEUTROS ABS: 3.5 10*3/uL (ref 1.4–7.7)
Neutrophils Relative %: 50 % (ref 43.0–77.0)
Platelets: 240 10*3/uL (ref 150.0–400.0)
RBC: 5.07 Mil/uL (ref 4.22–5.81)
RDW: 12.4 % (ref 11.5–15.5)
WBC: 7 10*3/uL (ref 4.0–10.5)

## 2014-08-09 LAB — LIPID PANEL
CHOL/HDL RATIO: 3
Cholesterol: 115 mg/dL (ref 0–200)
HDL: 35.3 mg/dL — ABNORMAL LOW (ref >39.00)
LDL Cholesterol: 63 mg/dL (ref 0–99)
NONHDL: 79.7
TRIGLYCERIDES: 82 mg/dL (ref 0.0–149.0)
VLDL: 16.4 mg/dL (ref 0.0–40.0)

## 2014-08-09 LAB — URINALYSIS, ROUTINE W REFLEX MICROSCOPIC
Bilirubin Urine: NEGATIVE
Hgb urine dipstick: NEGATIVE
Ketones, ur: NEGATIVE
Leukocytes, UA: NEGATIVE
Nitrite: NEGATIVE
PH: 7.5 (ref 5.0–8.0)
RBC / HPF: NONE SEEN (ref 0–?)
SPECIFIC GRAVITY, URINE: 1.01 (ref 1.000–1.030)
TOTAL PROTEIN, URINE-UPE24: NEGATIVE
URINE GLUCOSE: NEGATIVE
Urobilinogen, UA: 2 — AB (ref 0.0–1.0)

## 2014-08-09 LAB — BASIC METABOLIC PANEL
BUN: 13 mg/dL (ref 6–23)
CHLORIDE: 103 meq/L (ref 96–112)
CO2: 29 meq/L (ref 19–32)
CREATININE: 1 mg/dL (ref 0.4–1.5)
Calcium: 9.3 mg/dL (ref 8.4–10.5)
GFR: 76.57 mL/min (ref >60.00)
Glucose, Bld: 106 mg/dL — ABNORMAL HIGH (ref 70–99)
POTASSIUM: 4.4 meq/L (ref 3.5–5.1)
Sodium: 137 mEq/L (ref 135–145)

## 2014-08-09 LAB — PSA: PSA: 0.79 ng/mL (ref 0.10–4.00)

## 2014-08-09 LAB — TSH: TSH: 2.15 u[IU]/mL (ref 0.35–4.50)

## 2014-08-10 ENCOUNTER — Encounter: Payer: PRIVATE HEALTH INSURANCE | Admitting: Internal Medicine

## 2014-08-11 MED ORDER — LAMOTRIGINE 25 MG PO TABS: 75.0000 mg | ORAL_TABLET | Freq: Two times a day (BID) | ORAL | Status: DC

## 2014-08-11 NOTE — Telephone Encounter (Signed)
Will need to print 3 ritalin rx for pick up, 30 d each Then call when ready for pick up thanks

## 2014-08-14 ENCOUNTER — Other Ambulatory Visit: Payer: Self-pay

## 2014-08-14 DIAGNOSIS — R319 Hematuria, unspecified: Secondary | ICD-10-CM

## 2014-08-14 DIAGNOSIS — R7303 Prediabetes: Secondary | ICD-10-CM

## 2014-08-14 MED ORDER — METHYLPHENIDATE HCL ER (XR) 40 MG PO CP24: 30.0000 mg | ORAL_CAPSULE | ORAL | Status: DC

## 2014-08-14 NOTE — Telephone Encounter (Signed)
Pt informed rx is ready for pick up

## 2014-08-16 ENCOUNTER — Encounter: Payer: PRIVATE HEALTH INSURANCE | Admitting: Internal Medicine

## 2014-08-17 ENCOUNTER — Other Ambulatory Visit: Payer: Self-pay

## 2014-08-17 DIAGNOSIS — R319 Hematuria, unspecified: Secondary | ICD-10-CM

## 2014-08-17 DIAGNOSIS — R7303 Prediabetes: Secondary | ICD-10-CM

## 2014-08-17 MED ORDER — METHYLPHENIDATE HCL ER (XR) 40 MG PO CP24: 30.0000 mg | ORAL_CAPSULE | ORAL | Status: DC

## 2014-08-20 ENCOUNTER — Other Ambulatory Visit: Payer: Self-pay

## 2014-08-20 DIAGNOSIS — R319 Hematuria, unspecified: Secondary | ICD-10-CM

## 2014-08-20 DIAGNOSIS — R7303 Prediabetes: Secondary | ICD-10-CM

## 2014-08-20 MED ORDER — METHYLPHENIDATE HCL ER (XR) 40 MG PO CP24: 30.0000 mg | ORAL_CAPSULE | ORAL | Status: DC

## 2014-08-31 ENCOUNTER — Other Ambulatory Visit (HOSPITAL_COMMUNITY): Payer: Self-pay | Admitting: Urology

## 2014-08-31 DIAGNOSIS — N2889 Other specified disorders of kidney and ureter: Secondary | ICD-10-CM

## 2014-10-01 ENCOUNTER — Ambulatory Visit (HOSPITAL_COMMUNITY)
Admission: RE | Admit: 2014-10-01 | Discharge: 2014-10-01 | Disposition: A | Discharge status: Home / Self Care | Payer: No Typology Code available for payment source | Source: Ambulatory Visit | Attending: Urology | Admitting: Urology

## 2014-10-01 DIAGNOSIS — N2889 Other specified disorders of kidney and ureter: Secondary | ICD-10-CM

## 2014-10-01 DIAGNOSIS — K7689 Other specified diseases of liver: Secondary | ICD-10-CM | POA: Insufficient documentation

## 2014-10-01 DIAGNOSIS — N289 Disorder of kidney and ureter, unspecified: Secondary | ICD-10-CM | POA: Insufficient documentation

## 2014-10-01 DIAGNOSIS — K760 Fatty (change of) liver, not elsewhere classified: Secondary | ICD-10-CM | POA: Diagnosis not present

## 2014-10-01 LAB — POCT I-STAT CREATININE: CREATININE: 1.4 mg/dL — AB (ref 0.50–1.35)

## 2014-10-01 MED ORDER — GADOBENATE DIMEGLUMINE 529 MG/ML IV SOLN
15.0000 mL | Freq: Once | INTRAVENOUS | Status: AC | PRN
  Administered 2014-10-01: 15 mL via INTRAVENOUS

## 2014-11-14 ENCOUNTER — Encounter: Payer: Self-pay | Admitting: Internal Medicine

## 2014-11-14 ENCOUNTER — Ambulatory Visit (INDEPENDENT_AMBULATORY_CARE_PROVIDER_SITE_OTHER): Payer: PRIVATE HEALTH INSURANCE | Admitting: Internal Medicine

## 2014-11-14 VITALS — BP 148/82 | HR 101 | Temp 97.3°F | Ht 64.0 in | Wt 177.1 lb

## 2014-11-14 DIAGNOSIS — R7303 Prediabetes: Secondary | ICD-10-CM

## 2014-11-14 DIAGNOSIS — I10 Essential (primary) hypertension: Secondary | ICD-10-CM

## 2014-11-14 DIAGNOSIS — F341 Dysthymic disorder: Secondary | ICD-10-CM

## 2014-11-14 DIAGNOSIS — Z0000 Encounter for general adult medical examination without abnormal findings: Secondary | ICD-10-CM

## 2014-11-14 DIAGNOSIS — R7309 Other abnormal glucose: Secondary | ICD-10-CM

## 2014-11-14 DIAGNOSIS — E118 Type 2 diabetes mellitus with unspecified complications: Secondary | ICD-10-CM | POA: Insufficient documentation

## 2014-11-14 MED ORDER — METHYLPHENIDATE HCL ER (XR) 40 MG PO CP24: 30.0000 mg | ORAL_CAPSULE | ORAL | Status: DC

## 2014-11-14 NOTE — Assessment & Plan Note (Signed)
Reports symptoms well controlled on current combination of Lamictal and Ritalin The current medical regimen is effective;  continue present plan and medications.  Refills provided

## 2014-11-14 NOTE — Patient Instructions (Addendum)
It was good to see you today.  We have reviewed your prior records including labs and tests today  Health Maintenance reviewed - all recommended immunizations and age-appropriate screenings are up-to-date.  Medications reviewed and updated, no changes recommended at this time. Refill on medication(s) as discussed today.  Please schedule followup in 6 months for semiannual exam and labs, call sooner if problems.  Health Maintenance A healthy lifestyle and preventative care can promote health and wellness.  Maintain regular health, dental, and eye exams.  Eat a healthy diet. Foods like vegetables, fruits, whole grains, low-fat dairy products, and lean protein foods contain the nutrients you need and are low in calories. Decrease your intake of foods high in solid fats, added sugars, and salt. Get information about a proper diet from your health care provider, if necessary.  Regular physical exercise is one of the most important things you can do for your health. Most adults should get at least 150 minutes of moderate-intensity exercise (any activity that increases your heart rate and causes you to sweat) each week. In addition, most adults need muscle-strengthening exercises on 2 or more days a week.   Maintain a healthy weight. The body mass index (BMI) is a screening tool to identify possible weight problems. It provides an estimate of body fat based on height and weight. Your health care provider can find your BMI and can help you achieve or maintain a healthy weight. For males 20 years and older:  A BMI below 18.5 is considered underweight.  A BMI of 18.5 to 24.9 is normal.  A BMI of 25 to 29.9 is considered overweight.  A BMI of 30 and above is considered obese.  Maintain normal blood lipids and cholesterol by exercising and minimizing your intake of saturated fat. Eat a balanced diet with plenty of fruits and vegetables. Blood tests for lipids and cholesterol should begin at age 39  and be repeated every 5 years. If your lipid or cholesterol levels are high, you are over age 55, or you are at high risk for heart disease, you may need your cholesterol levels checked more frequently.Ongoing high lipid and cholesterol levels should be treated with medicines if diet and exercise are not working.  If you smoke, find out from your health care provider how to quit. If you do not use tobacco, do not start.  Lung cancer screening is recommended for adults aged 26-80 years who are at high risk for developing lung cancer because of a history of smoking. A yearly low-dose CT scan of the lungs is recommended for people who have at least a 30-pack-year history of smoking and are current smokers or have quit within the past 15 years. A pack year of smoking is smoking an average of 1 pack of cigarettes a day for 1 year (for example, a 30-pack-year history of smoking could mean smoking 1 pack a day for 30 years or 2 packs a day for 15 years). Yearly screening should continue until the smoker has stopped smoking for at least 15 years. Yearly screening should be stopped for people who develop a health problem that would prevent them from having lung cancer treatment.  If you choose to drink alcohol, do not have more than 2 drinks per day. One drink is considered to be 12 oz (360 mL) of beer, 5 oz (150 mL) of wine, or 1.5 oz (45 mL) of liquor.  Avoid the use of street drugs. Do not share needles with anyone. Ask for  help if you need support or instructions about stopping the use of drugs.  High blood pressure causes heart disease and increases the risk of stroke. Blood pressure should be checked at least every 1-2 years. Ongoing high blood pressure should be treated with medicines if weight loss and exercise are not effective.  If you are 79-38 years old, ask your health care provider if you should take aspirin to prevent heart disease.  Diabetes screening involves taking a blood sample to check  your fasting blood sugar level. This should be done once every 3 years after age 50 if you are at a normal weight and without risk factors for diabetes. Testing should be considered at a younger age or be carried out more frequently if you are overweight and have at least 1 risk factor for diabetes.  Colorectal cancer can be detected and often prevented. Most routine colorectal cancer screening begins at the age of 49 and continues through age 53. However, your health care provider may recommend screening at an earlier age if you have risk factors for colon cancer. On a yearly basis, your health care provider may provide home test kits to check for hidden blood in the stool. A small camera at the end of a tube may be used to directly examine the colon (sigmoidoscopy or colonoscopy) to detect the earliest forms of colorectal cancer. Talk to your health care provider about this at age 78 when routine screening begins. A direct exam of the colon should be repeated every 5-10 years through age 11, unless early forms of precancerous polyps or small growths are found.  People who are at an increased risk for hepatitis B should be screened for this virus. You are considered at high risk for hepatitis B if:  You were born in a country where hepatitis B occurs often. Talk with your health care provider about which countries are considered high risk.  Your parents were born in a high-risk country and you have not received a shot to protect against hepatitis B (hepatitis B vaccine).  You have HIV or AIDS.  You use needles to inject street drugs.  You live with, or have sex with, someone who has hepatitis B.  You are a man who has sex with other men (MSM).  You get hemodialysis treatment.  You take certain medicines for conditions like cancer, organ transplantation, and autoimmune conditions.  Hepatitis C blood testing is recommended for all people born from [birth year] through 1965 and any individual with known  risk factors for hepatitis C.  Healthy men should no longer receive prostate-specific antigen (PSA) blood tests as part of routine cancer screening. Talk to your health care provider about prostate cancer screening.  Testicular cancer screening is not recommended for adolescents or adult males who have no symptoms. Screening includes self-exam, a health care provider exam, and other screening tests. Consult with your health care provider about any symptoms you have or any concerns you have about testicular cancer.  Practice safe sex. Use condoms and avoid high-risk sexual practices to reduce the spread of sexually transmitted infections (STIs).  You should be screened for STIs, including gonorrhea and chlamydia if:  You are sexually active and are younger than 24 years.  You are older than 24 years, and your health care provider tells you that you are at risk for this type of infection.  Your sexual activity has changed since you were last screened, and you are at an increased risk for chlamydia or gonorrhea.  Ask your health care provider if you are at risk.  If you are at risk of being infected with HIV, it is recommended that you take a prescription medicine daily to prevent HIV infection. This is called pre-exposure prophylaxis (PrEP). You are considered at risk if:  You are a man who has sex with other men (MSM).  You are a heterosexual man who is sexually active with multiple partners.  You take drugs by injection.  You are sexually active with a partner who has HIV.  Talk with your health care provider about whether you are at high risk of being infected with HIV. If you choose to begin PrEP, you should first be tested for HIV. You should then be tested every 3 months for as long as you are taking PrEP.  Use sunscreen. Apply sunscreen liberally and repeatedly throughout the day. You should seek shade when your shadow is shorter than you. Protect yourself by wearing long sleeves,  pants, a wide-brimmed hat, and sunglasses year round whenever you are outdoors.  Tell your health care provider of new moles or changes in moles, especially if there is a change in shape or color. Also, tell your health care provider if a mole is larger than the size of a pencil eraser.  A one-time screening for abdominal aortic aneurysm (AAA) and surgical repair of large AAAs by ultrasound is recommended for men aged 75-75 years who are current or former smokers.  Stay current with your vaccines (immunizations). Document Released: 05/21/2008 Document Revised: 11/28/2013 Document Reviewed: 04/20/2011 [facility] Patient Information 2015 [place], [place]. This information is not intended to replace advice given to you by your health care provider. Make sure you discuss any questions you have with your health care provider.

## 2014-11-14 NOTE — Assessment & Plan Note (Signed)
FH same reviewed The patient is asked to make an attempt to improve diet and exercise patterns to aid in medical management of this problem. monitor a1c q6-37mo

## 2014-11-14 NOTE — Progress Notes (Signed)
Pre visit review using our clinic review tool, if applicable. No additional management support is needed unless otherwise documented below in the visit note. 

## 2014-11-14 NOTE — Progress Notes (Signed)
Subjective:    Patient ID: Robert Mccall, male    DOB: [birth year], 62 y.o.   MRN: [id number]  HPI  Here to establish with me as PCP, transfer from [facility]  patient is here today for annual physical. Patient feels well and has no complaints.  Also reviewed chronic medical issues and interval medical events  Past Medical History  Diagnosis Date  . Hyperlipidemia   . ADHD (attention deficit hyperactivity disorder)   . Benign prostatic hypertrophy   . Hypertension   . Depression   . Cyst of right kidney     incidental Bosiak 1.3cm on R (MRI abd 09/2014), follows with uro   Family History  Problem Relation Age of Onset  . COPD Mother   . Heart disease Mother   . Macular degeneration Father   . Diabetes Father   . Breast cancer Mother   . Osteoarthritis Mother     s/p B TKR  . Depression Mother    History  Substance Use Topics  . Smoking status: Never Smoker   . Smokeless tobacco: Not on file  . Alcohol Use: 3.0 oz/week    5 Glasses of wine per week    Review of Systems  Constitutional: Negative for fever, activity change, appetite change, fatigue and unexpected weight change.  HENT: Positive for hearing loss (B, working with audiology, B hearing aides) and voice change (x 1-67mo, intermittent - ?sinus drainage = worse sx). Negative for congestion and dental problem.   Eyes: Positive for visual disturbance (occassional occular migraines).  Respiratory: Negative for cough, chest tightness, shortness of breath and wheezing.   Cardiovascular: Negative for chest pain, palpitations and leg swelling.  Gastrointestinal: Positive for constipation.  Neurological: Negative for dizziness, weakness and headaches.  Psychiatric/Behavioral: Positive for decreased concentration (distractable, but improved with meds). Negative for confusion, self-injury, dysphoric mood and agitation. The patient is not nervous/anxious.   All other systems reviewed and are negative.      Objective:   Physical Exam  BP 148/82 mmHg  Pulse 101  Temp(Src) 97.3 F (36.3 C) (Oral)  Ht 5\' 4"  (1.626 m)  Wt 177 lb 2 oz (80.343 kg)  BMI 30.39 kg/m2  SpO2 96% Wt Readings from Last 3 Encounters:  11/14/14 177 lb 2 oz (80.343 kg)  01/15/14 168 lb (76.204 kg)  08/31/13 186 lb (84.369 kg)   Constitutional: he appears well-developed and well-nourished. No distress.  HENT: Head: Normocephalic and atraumatic. Ears: B TMs ok, no erythema or effusion; Nose: Nose normal. Mouth/Throat: Oropharynx is clear and moist. No oropharyngeal exudate.  Eyes: Conjunctivae and EOM are normal. Pupils are equal, round, and reactive to light. No scleral icterus.  Neck: Normal range of motion. Neck supple. No JVD present. No thyromegaly present.  Cardiovascular: Normal rate, regular rhythm and normal heart sounds.  No murmur heard. No BLE edema. Pulmonary/Chest: Effort normal and breath sounds normal. No respiratory distress. he has no wheezes.  Abdominal: Soft. Bowel sounds are normal. he exhibits no distension. There is no tenderness. no masses GU: defer Musculoskeletal: Normal range of motion, no joint effusions. No gross deformities Neurological: he is alert and oriented to person, place, and time. No cranial nerve deficit. Coordination, balance, strength, speech and gait are normal.  Skin: Skin is warm and dry. No rash noted. No erythema.  Psychiatric: he has a normal mood and affect. behavior is normal. Judgment and thought content normal.   Lab Results  Component Value Date   WBC 7.0 08/09/2014  HGB 15.6 08/09/2014   HCT 45.7 08/09/2014   PLT 240.0 08/09/2014   GLUCOSE 106* 08/09/2014   CHOL 115 08/09/2014   TRIG 82.0 08/09/2014   HDL 35.30* 08/09/2014   LDLCALC 63 08/09/2014   ALT 28 08/09/2014   AST 30 08/09/2014   NA 137 08/09/2014   K 4.4 08/09/2014   CL 103 08/09/2014   CREATININE 1.40* 10/01/2014   BUN 13 08/09/2014   CO2 29 08/09/2014   TSH 2.15 08/09/2014   PSA 0.79 08/09/2014    Mr  Abdomen W Wo Contrast  10/01/2014   CLINICAL DATA:  Evaluate renal mass.  EXAM: MRI ABDOMEN WITHOUT AND WITH CONTRAST  TECHNIQUE: Multiplanar multisequence MR imaging of the abdomen was performed both before and after the administration of intravenous contrast.  CONTRAST:  62mL MULTIHANCE GADOBENATE DIMEGLUMINE 529 MG/ML IV SOLN  COMPARISON:  04/02/2014  FINDINGS: Lower chest:  No pleural effusion identified.  Hepatobiliary: Hepatic steatosis identified. Cyst in the lateral segment of left lobe of liver measures 9 mm. No suspicious enhancing liver abnormalities. The gallbladder appears normal. No biliary dilatation identified.  Pancreas: Normal appearance of the pancreas. No pancreatic duct dilatation.  Spleen: The spleen is unremarkable.  Adrenals/Urinary Tract: Normal appearance of both adrenal glands. Multiple areas of scarring are identified within the right kidney. Within the inferior pole of the right kidney there is a exophytic lesion measuring 1.3 cm. This exhibits increased T1 signal. No enhancement identified within this lesion. No suspicious left renal abnormalities.  Stomach/Bowel: The stomach and upper abdominal bowel loops are unremarkable.  Vascular/Lymphatic: Normal caliber of the abdominal aorta. The SMV, portal venous confluence, portal vein and splenic vein are patent. No retroperitoneal adenopathy identified. No mesenteric adenopathy.  Other: There is no ascites within the upper abdomen.  Musculoskeletal: Normal signal throughout the bone marrow.  IMPRESSION: 1. No acute findings within the upper abdomen. 2. Benign Bosniak category 2 lesion is identified within the right kidney. 3. Hepatic steatosis.   Electronically Signed   By: Kerby Moors M.D.   On: 10/01/2014 10:18       Assessment & Plan:   CPX/z00.00 - Patient has been counseled on age-appropriate routine health concerns for screening and prevention. These are reviewed and up-to-date. Immunizations are up-to-date or declined.  Labs 08/2014 reviewed.  Problem List Items Addressed This Visit    Dysthymic disorder    Reports symptoms well controlled on current combination of Lamictal and Ritalin The current medical regimen is effective;  continue present plan and medications.  Refills provided    Essential hypertension    BP Readings from Last 3 Encounters:  11/14/14 148/82  01/15/14 120/78  08/31/13 130/84   The current medical regimen is effective;  continue present plan and medications.     Prediabetes    FH same reviewed The patient is asked to make an attempt to improve diet and exercise patterns to aid in medical management of this problem. monitor a1c q6-62mo     Other Visit Diagnoses    Routine general medical examination at a health care facility    -  Primary

## 2014-11-14 NOTE — Assessment & Plan Note (Signed)
BP Readings from Last 3 Encounters:  11/14/14 148/82  01/15/14 120/78  08/31/13 130/84   The current medical regimen is effective;  continue present plan and medications.

## 2014-12-12 ENCOUNTER — Encounter: Payer: Self-pay | Admitting: Internal Medicine

## 2014-12-12 ENCOUNTER — Other Ambulatory Visit: Payer: Self-pay

## 2014-12-12 MED ORDER — HYDROCHLOROTHIAZIDE 12.5 MG PO CAPS: 12.5000 mg | ORAL_CAPSULE | Freq: Every day | ORAL | Status: DC

## 2014-12-12 MED ORDER — SIMVASTATIN 40 MG PO TABS: 40.0000 mg | ORAL_TABLET | Freq: Every evening | ORAL | Status: DC

## 2014-12-26 ENCOUNTER — Encounter: Payer: Self-pay | Admitting: Internal Medicine

## 2014-12-27 MED ORDER — VERAPAMIL HCL ER 240 MG PO TBCR: 240.0000 mg | EXTENDED_RELEASE_TABLET | Freq: Every day | ORAL | Status: DC

## 2014-12-27 MED ORDER — GABAPENTIN 300 MG PO CAPS: 300.0000 mg | ORAL_CAPSULE | Freq: Three times a day (TID) | ORAL | Status: DC

## 2014-12-27 MED ORDER — ALFUZOSIN HCL ER 10 MG PO TB24: 10.0000 mg | ORAL_TABLET | Freq: Every day | ORAL | Status: DC

## 2014-12-27 MED ORDER — LAMOTRIGINE 25 MG PO TABS: 75.0000 mg | ORAL_TABLET | Freq: Two times a day (BID) | ORAL | Status: DC

## 2014-12-27 MED ORDER — FINASTERIDE 5 MG PO TABS: 5.0000 mg | ORAL_TABLET | Freq: Every day | ORAL | Status: DC

## 2014-12-27 MED ORDER — ESOMEPRAZOLE MAGNESIUM 40 MG PO CPDR: 40.0000 mg | DELAYED_RELEASE_CAPSULE | Freq: Every day | ORAL | Status: DC

## 2015-01-02 MED ORDER — GABAPENTIN 300 MG PO CAPS: 300.0000 mg | ORAL_CAPSULE | Freq: Three times a day (TID) | ORAL | Status: DC

## 2015-01-02 MED ORDER — VERAPAMIL HCL ER 240 MG PO TBCR: 240.0000 mg | EXTENDED_RELEASE_TABLET | Freq: Every day | ORAL | Status: DC

## 2015-01-02 MED ORDER — FINASTERIDE 5 MG PO TABS: 5.0000 mg | ORAL_TABLET | Freq: Every day | ORAL | Status: DC

## 2015-01-02 MED ORDER — ESOMEPRAZOLE MAGNESIUM 40 MG PO CPDR: 40.0000 mg | DELAYED_RELEASE_CAPSULE | Freq: Every day | ORAL | Status: DC

## 2015-01-02 MED ORDER — LAMOTRIGINE 25 MG PO TABS: 75.0000 mg | ORAL_TABLET | Freq: Two times a day (BID) | ORAL | Status: DC

## 2015-01-02 MED ORDER — ALFUZOSIN HCL ER 10 MG PO TB24: 10.0000 mg | ORAL_TABLET | Freq: Every day | ORAL | Status: DC

## 2015-01-02 NOTE — Addendum Note (Signed)
Addended by: Estell Harpin T on: 01/02/2015 10:42 AM   Modules accepted: Orders

## 2015-01-14 ENCOUNTER — Other Ambulatory Visit: Payer: Self-pay | Admitting: Internal Medicine

## 2015-01-14 ENCOUNTER — Other Ambulatory Visit: Payer: Self-pay

## 2015-01-14 MED ORDER — METHYLPHENIDATE HCL ER (XR) 40 MG PO CP24: 30.0000 mg | ORAL_CAPSULE | ORAL | Status: DC

## 2015-01-16 ENCOUNTER — Encounter: Payer: Self-pay | Admitting: Internal Medicine

## 2015-01-17 ENCOUNTER — Telehealth: Payer: Self-pay | Admitting: Internal Medicine

## 2015-01-17 MED ORDER — METHYLPHENIDATE HCL ER (XR) 40 MG PO CP24: 40.0000 mg | ORAL_CAPSULE | ORAL | Status: DC

## 2015-01-17 NOTE — Telephone Encounter (Signed)
[facility] called need to verify Script   Robert Mccall [phone]

## 2015-01-17 NOTE — Telephone Encounter (Signed)
Spoke to Kincaid at [employer]. She stated that the ritalin rx has an incorrect sig. I investigated the chart notes and found the 08/2014 email conversation where it was increased to 40mg  from 30mg  capsules. The sig did stay the same.  Gave pharmacist the verbal okay to correct the sig to state 1 capsule daily.  Added not to sig and changed the sig per above mentioned chart note. Also, noted that the rx for 01/17/15 was not filled.

## 2015-02-07 ENCOUNTER — Encounter: Payer: Self-pay | Admitting: Internal Medicine

## 2015-02-11 ENCOUNTER — Other Ambulatory Visit: Payer: Self-pay | Admitting: Internal Medicine

## 2015-02-11 DIAGNOSIS — H9313 Tinnitus, bilateral: Secondary | ICD-10-CM

## 2015-02-12 ENCOUNTER — Other Ambulatory Visit: Payer: Self-pay | Admitting: Internal Medicine

## 2015-02-19 ENCOUNTER — Other Ambulatory Visit: Payer: Self-pay

## 2015-02-19 MED ORDER — METHYLPHENIDATE HCL ER (XR) 40 MG PO CP24: 40.0000 mg | ORAL_CAPSULE | ORAL | Status: DC

## 2015-05-07 ENCOUNTER — Encounter: Payer: Self-pay | Admitting: Internal Medicine

## 2015-05-07 ENCOUNTER — Ambulatory Visit (INDEPENDENT_AMBULATORY_CARE_PROVIDER_SITE_OTHER): Payer: PRIVATE HEALTH INSURANCE | Admitting: Internal Medicine

## 2015-05-07 VITALS — BP 140/80 | HR 100 | Temp 97.6°F | Ht 64.0 in | Wt 179.0 lb

## 2015-05-07 DIAGNOSIS — M65341 Trigger finger, right ring finger: Secondary | ICD-10-CM

## 2015-05-07 DIAGNOSIS — G8929 Other chronic pain: Secondary | ICD-10-CM | POA: Insufficient documentation

## 2015-05-07 DIAGNOSIS — K219 Gastro-esophageal reflux disease without esophagitis: Secondary | ICD-10-CM | POA: Insufficient documentation

## 2015-05-07 DIAGNOSIS — E785 Hyperlipidemia, unspecified: Secondary | ICD-10-CM

## 2015-05-07 DIAGNOSIS — R7303 Prediabetes: Secondary | ICD-10-CM

## 2015-05-07 DIAGNOSIS — F341 Dysthymic disorder: Secondary | ICD-10-CM | POA: Diagnosis not present

## 2015-05-07 DIAGNOSIS — R7309 Other abnormal glucose: Secondary | ICD-10-CM

## 2015-05-07 DIAGNOSIS — N281 Cyst of kidney, acquired: Secondary | ICD-10-CM | POA: Insufficient documentation

## 2015-05-07 DIAGNOSIS — I10 Essential (primary) hypertension: Secondary | ICD-10-CM

## 2015-05-07 MED ORDER — DULOXETINE HCL 30 MG PO CPEP: 30.0000 mg | ORAL_CAPSULE | Freq: Every day | ORAL | Status: DC

## 2015-05-07 NOTE — Progress Notes (Signed)
Pre visit review using our clinic review tool, if applicable. No additional management support is needed unless otherwise documented below in the visit note. 

## 2015-05-07 NOTE — Assessment & Plan Note (Signed)
Reports increasing symptoms over past 6 months consistent with recurrent depression  Has been well controlled on current combination of Lamictal and Ritalin since early 2015 [place] SI/HI Prior treatments with Prozac and sertraline only modestly effective  Start Cymbalta 30 mg daily now - we reviewed potential risk/benefit and possible side effects - pt understands and agrees to same  Discussed potential benefit of counseling. Patient has previously seen [facility] with good results but declines need for referral at this time. Patient will call if desired for referral between now and next visit

## 2015-05-07 NOTE — Assessment & Plan Note (Signed)
FH same reviewed The patient is asked to make an attempt to improve diet and exercise patterns to aid in medical management of this problem. monitor a1c q6-55mo No results found for: HGBA1C

## 2015-05-07 NOTE — Assessment & Plan Note (Signed)
On simvastatin, last lipids at goal Labs checked every September with employer. Patient will fax copy of results once available for review The current medical regimen is effective;  continue present plan and medications.
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
## 2015-05-07 NOTE — Patient Instructions (Signed)
It was good to see you today.  We have reviewed your prior records including labs and tests today  Medications reviewed and updated Start Cymbalta 30 mg daily -  No other changes recommended at this time. Your prescription(s) have been submitted to your pharmacy. Please take as directed and contact our office if you believe you are having problem(s) with the medication(s).  we'll make referral to hand surgeon for your trigger finger . Our office will contact you regarding appointment(s) once made.  Please schedule followup in 3-4 months, call sooner if problems.

## 2015-05-07 NOTE — Assessment & Plan Note (Signed)
BP Readings from Last 3 Encounters:  05/07/15 140/80  11/14/14 148/82  01/15/14 120/78   The current medical regimen is effective;  continue present plan and medications.

## 2015-05-13 ENCOUNTER — Encounter: Payer: Self-pay | Admitting: Internal Medicine

## 2015-05-15 MED ORDER — METHYLPHENIDATE HCL ER (XR) 40 MG PO CP24: 40.0000 mg | ORAL_CAPSULE | ORAL | Status: DC

## 2015-05-15 NOTE — Telephone Encounter (Signed)
Can print off one month.

## 2015-06-06 ENCOUNTER — Encounter: Payer: Self-pay | Admitting: Internal Medicine

## 2015-06-07 NOTE — Telephone Encounter (Signed)
Dr. Asa Lente patient requesting transfer to [facility].  Please advise.

## 2015-06-27 ENCOUNTER — Ambulatory Visit (INDEPENDENT_AMBULATORY_CARE_PROVIDER_SITE_OTHER): Payer: PRIVATE HEALTH INSURANCE | Admitting: Internal Medicine

## 2015-06-27 VITALS — BP 120/78 | HR 91 | Temp 98.4°F | Resp 16 | Ht 64.0 in | Wt 172.0 lb

## 2015-06-27 DIAGNOSIS — F329 Major depressive disorder, single episode, unspecified: Secondary | ICD-10-CM | POA: Diagnosis not present

## 2015-06-27 DIAGNOSIS — F32A Depression, unspecified: Secondary | ICD-10-CM

## 2015-06-27 DIAGNOSIS — I10 Essential (primary) hypertension: Secondary | ICD-10-CM | POA: Diagnosis not present

## 2015-06-27 MED ORDER — METHYLPHENIDATE HCL ER (XR) 40 MG PO CP24: 40.0000 mg | ORAL_CAPSULE | ORAL | Status: DC

## 2015-06-27 NOTE — Progress Notes (Signed)
Pre visit review using our clinic review tool, if applicable. No additional management support is needed unless otherwise documented below in the visit note. 

## 2015-06-27 NOTE — Patient Instructions (Signed)

## 2015-06-30 ENCOUNTER — Encounter: Payer: Self-pay | Admitting: Internal Medicine

## 2015-06-30 MED ORDER — METHYLPHENIDATE HCL ER (XR) 40 MG PO CP24: 40.0000 mg | ORAL_CAPSULE | ORAL | Status: DC

## 2015-06-30 NOTE — Progress Notes (Signed)
Subjective:  Patient ID: Robert Mccall, male    DOB: 21-Sep-1951  Age: 64 y.o. MRN: [id number]  CC: Depression and Hypertension   HPI [name] presents for establishing with a new provider. He has a long-standing history of depression and has been well maintained for years on Ritalin, Lamictal and Cymbalta. He comes in today requesting a refill on these medications. He says he feels well and offers no new complaints today.  History [name] has a past medical history of Hyperlipidemia; ADHD (attention deficit hyperactivity disorder); Benign prostatic hypertrophy; Hypertension; Depression; and Cyst of right kidney.   He has past surgical history that includes right testical hydroceole (1990) and Orchiectomy (1970).   His family history includes Breast cancer in his mother; COPD in his mother; Depression in his mother; Diabetes in his father; Heart disease in his mother; Macular degeneration in his father; Osteoarthritis in his mother.He reports that he has never smoked. He does not have any smokeless tobacco history on file. He reports that he drinks about 3.0 oz of alcohol per week. He reports that he does not use illicit drugs.  Outpatient Prescriptions Prior to Visit  Medication Sig Dispense Refill  . alfuzosin (UROXATRAL) 10 MG 24 hr tablet Take 1 tablet (10 mg total) by mouth daily with breakfast. 90 tablet 3  . aspirin 81 MG tablet Take 81 mg by mouth daily.      . Cholecalciferol (VITAMIN D) 2000 UNITS CAPS Take 1 capsule (2,000 Units total) by mouth 1 dose over 46 hours. 30 capsule 11  . DULoxetine (CYMBALTA) 30 MG capsule Take 1 capsule (30 mg total) by mouth daily. 30 capsule 2  . esomeprazole (NEXIUM) 40 MG capsule Take 1 capsule (40 mg total) by mouth daily. 90 capsule 3  . finasteride (PROSCAR) 5 MG tablet Take 1 tablet (5 mg total) by mouth daily. 90 tablet 3  . gabapentin (NEURONTIN) 300 MG capsule Take 1 capsule (300 mg total) by mouth 3 (three) times daily. 180 capsule 3  .  hydrochlorothiazide (MICROZIDE) 12.5 MG capsule Take 1 capsule (12.5 mg total) by mouth daily. 90 capsule 3  . KRILL OIL 1000 MG CAPS Take 1 capsule by mouth daily.      Marland Kitchen lamoTRIgine (LAMICTAL) 25 MG tablet Take 3 tablets (75 mg total) by mouth 2 (two) times daily. 540 tablet 3  . niacin 500 MG tablet Take 500 mg by mouth daily with breakfast.      . simvastatin (ZOCOR) 40 MG tablet Take 1 tablet (40 mg total) by mouth every evening. 90 tablet 3  . Travoprost, BAK Free, (TRAVATAN) 0.004 % SOLN ophthalmic solution Place 1 drop into the right eye at bedtime.    . verapamil (CALAN-SR) 240 MG CR tablet Take 1 tablet (240 mg total) by mouth daily. 90 tablet 3  . Methylphenidate HCl ER, XR, 40 MG CP24 Take 40 mg by mouth every morning. 30 capsule 0   No facility-administered medications prior to visit.    ROS Review of Systems  Constitutional: Negative.  Negative for fever, chills, diaphoresis, appetite change and fatigue.  HENT: Negative.   Eyes: Negative.   Respiratory: Negative.  Negative for cough, choking, chest tightness, shortness of breath and stridor.   Cardiovascular: Negative.  Negative for chest pain and leg swelling.  Gastrointestinal: Negative.  Negative for nausea, vomiting, abdominal pain, diarrhea, constipation and blood in stool.  Endocrine: Negative.   Genitourinary: Negative.   Musculoskeletal: Negative.   Skin: Negative.  Allergic/Immunologic: Negative.   Neurological: Negative.  Negative for dizziness, tremors, weakness, light-headedness, numbness and headaches.  Hematological: Negative.  Negative for adenopathy. Does not bruise/bleed easily.  Psychiatric/Behavioral: Positive for dysphoric mood. Negative for suicidal ideas, hallucinations, behavioral problems, confusion, sleep disturbance, self-injury, decreased concentration and agitation. The patient is not nervous/anxious and is not hyperactive.     Objective:  BP 120/78 mmHg  Pulse 91  Temp(Src) 98.4 F (36.9  C) (Oral)  Resp 16  Ht 5\' 4"  (1.626 m)  Wt 172 lb (78.019 kg)  BMI 29.51 kg/m2  SpO2 95%  Physical Exam  Constitutional: He is oriented to person, place, and time. No distress.  HENT:  Mouth/Throat: Oropharynx is clear and moist. No oropharyngeal exudate.  Eyes: Conjunctivae are normal. Right eye exhibits no discharge. Left eye exhibits no discharge. No scleral icterus.  Neck: Normal range of motion. Neck supple. No JVD present. No tracheal deviation present. No thyromegaly present.  Cardiovascular: Normal rate, regular rhythm, normal heart sounds and intact distal pulses.  Exam reveals no gallop and no friction rub.   No murmur heard. Pulmonary/Chest: Effort normal and breath sounds normal. No stridor. No respiratory distress. He has no wheezes. He has no rales. He exhibits no tenderness.  Abdominal: Soft. Bowel sounds are normal. He exhibits no distension and no mass. There is no tenderness. There is no rebound and no guarding.  Musculoskeletal: Normal range of motion. He exhibits no edema or tenderness.  Lymphadenopathy:    He has no cervical adenopathy.  Neurological: He is oriented to person, place, and time.  Skin: Skin is warm and dry. No rash noted. He is not diaphoretic. No erythema. No pallor.  Psychiatric: He has a normal mood and affect. His speech is normal and behavior is normal. Judgment and thought content normal. His mood appears not anxious. His affect is not angry and not blunt. He is not agitated, not slowed and not withdrawn. Cognition and memory are normal. He does not exhibit a depressed mood. He expresses no homicidal and no suicidal ideation. He expresses no suicidal plans and no homicidal plans.  Vitals reviewed.   Lab Results  Component Value Date   WBC 7.0 08/09/2014   HGB 15.6 08/09/2014   HCT 45.7 08/09/2014   PLT 240.0 08/09/2014   GLUCOSE 106* 08/09/2014   CHOL 115 08/09/2014   TRIG 82.0 08/09/2014   HDL 35.30* 08/09/2014   LDLCALC 63 08/09/2014    ALT 28 08/09/2014   AST 30 08/09/2014   NA 137 08/09/2014   K 4.4 08/09/2014   CL 103 08/09/2014   CREATININE 1.40* 10/01/2014   BUN 13 08/09/2014   CO2 29 08/09/2014   TSH 2.15 08/09/2014   PSA 0.79 08/09/2014      Assessment & Plan:   Michelangelo was seen today for depression and hypertension.  Diagnoses and all orders for this visit:  Essential hypertension- his blood pressure is well controlled.  Other orders- his depression has been well controlled on his current regimen and he appears stable today. Will continue the current meds with no changes. -     Methylphenidate HCl ER, XR, 40 MG CP24; Take 40 mg by mouth every morning. -     Discontinue: Methylphenidate HCl ER, XR, 40 MG CP24; Take 40 mg by mouth every morning. -     Methylphenidate HCl ER, XR, 40 MG CP24; Take 40 mg by mouth every morning.   I am having Mr. Iles maintain his aspirin, Astrid Drafts, niacin,  Vitamin D, Travoprost (BAK Free), hydrochlorothiazide, simvastatin, alfuzosin, esomeprazole, finasteride, gabapentin, lamoTRIgine, verapamil, DULoxetine, Methylphenidate HCl ER (XR), and Methylphenidate HCl ER (XR).  Meds ordered this encounter  Medications  . Methylphenidate HCl ER, XR, 40 MG CP24    Sig: Take 40 mg by mouth every morning.    Dispense:  30 capsule    Refill:  0    May fill once every 30 days. July 2016  . DISCONTD: Methylphenidate HCl ER, XR, 40 MG CP24    Sig: Take 40 mg by mouth every morning.    Dispense:  30 capsule    Refill:  0    May fill once every 30 days. August 2016  . DISCONTD: Methylphenidate HCl ER, XR, 40 MG CP24    Sig: Take 40 mg by mouth every morning.    Dispense:  30 capsule    Refill:  0    May fill once every 30 days. September 2016  . Methylphenidate HCl ER, XR, 40 MG CP24    Sig: Take 40 mg by mouth every morning.    Dispense:  30 capsule    Refill:  0    May fill once every 30 days. September 2016     Follow-up: Return in about 3 months (around  09/27/2015).  Scarlette Calico, MD

## 2015-07-17 ENCOUNTER — Other Ambulatory Visit: Payer: Self-pay | Admitting: Internal Medicine

## 2015-07-22 ENCOUNTER — Encounter: Payer: Self-pay | Admitting: Internal Medicine

## 2015-08-02 LAB — LIPID PANEL
Cholesterol: 104 mg/dL (ref 0–200)
HDL: 40 mg/dL (ref 35–70)
LDL Cholesterol: 50 mg/dL
LDL/HDL RATIO: 2.6
Triglycerides: 71 mg/dL (ref 40–160)

## 2015-08-02 LAB — CBC AND DIFFERENTIAL
HCT: 42 % (ref 41–53)
Hemoglobin: 14.4 g/dL (ref 13.5–17.5)
PLATELETS: 270 10*3/uL (ref 150–399)
WBC: 6 10^3/mL

## 2015-08-02 LAB — BASIC METABOLIC PANEL
BUN: 13 mg/dL (ref 4–21)
Creatinine: 1.1 mg/dL (ref 0.6–1.3)
Glucose: 102 mg/dL
Potassium: 3.8 mmol/L (ref 3.4–5.3)
Sodium: 139 mmol/L (ref 137–147)

## 2015-08-02 LAB — HEMOGLOBIN A1C: Hgb A1c MFr Bld: 5.5 % (ref 4.0–6.0)

## 2015-08-02 LAB — HEPATIC FUNCTION PANEL
ALK PHOS: 55 U/L (ref 25–125)
ALT: 27 U/L (ref 10–40)
AST: 30 U/L (ref 14–40)
BILIRUBIN, TOTAL: 0.6 mg/dL

## 2015-08-02 LAB — TSH: TSH: 1.56 u[IU]/mL (ref 0.41–5.90)

## 2015-08-19 ENCOUNTER — Encounter: Payer: Self-pay | Admitting: Internal Medicine

## 2015-08-19 NOTE — Addendum Note (Signed)
Addended by: [name] on: 08/19/2015 02:03 PM   Modules accepted: Miquel Dunn

## 2015-08-20 ENCOUNTER — Ambulatory Visit: Payer: PRIVATE HEALTH INSURANCE | Admitting: Internal Medicine

## 2015-08-29 ENCOUNTER — Other Ambulatory Visit: Payer: Self-pay | Admitting: Internal Medicine

## 2015-09-09 ENCOUNTER — Telehealth: Payer: Self-pay | Admitting: Physician Assistant

## 2015-10-01 ENCOUNTER — Encounter: Payer: Self-pay | Admitting: Internal Medicine

## 2015-10-02 ENCOUNTER — Ambulatory Visit (INDEPENDENT_AMBULATORY_CARE_PROVIDER_SITE_OTHER): Payer: PRIVATE HEALTH INSURANCE | Admitting: Internal Medicine

## 2015-10-02 ENCOUNTER — Encounter: Payer: Self-pay | Admitting: Internal Medicine

## 2015-10-02 VITALS — BP 140/88 | HR 90 | Temp 97.6°F | Resp 16 | Ht 64.0 in | Wt 174.0 lb

## 2015-10-02 DIAGNOSIS — F909 Attention-deficit hyperactivity disorder, unspecified type: Secondary | ICD-10-CM

## 2015-10-02 DIAGNOSIS — F329 Major depressive disorder, single episode, unspecified: Secondary | ICD-10-CM

## 2015-10-02 DIAGNOSIS — Z23 Encounter for immunization: Secondary | ICD-10-CM | POA: Diagnosis not present

## 2015-10-02 DIAGNOSIS — F32A Depression, unspecified: Secondary | ICD-10-CM

## 2015-10-02 DIAGNOSIS — F988 Other specified behavioral and emotional disorders with onset usually occurring in childhood and adolescence: Secondary | ICD-10-CM

## 2015-10-02 DIAGNOSIS — G2581 Restless legs syndrome: Secondary | ICD-10-CM

## 2015-10-02 DIAGNOSIS — G253 Myoclonus: Secondary | ICD-10-CM

## 2015-10-02 DIAGNOSIS — I10 Essential (primary) hypertension: Secondary | ICD-10-CM | POA: Diagnosis not present

## 2015-10-02 MED ORDER — METHYLPHENIDATE HCL ER (XR) 40 MG PO CP24: 40.0000 mg | ORAL_CAPSULE | ORAL | Status: DC

## 2015-10-02 NOTE — Patient Instructions (Signed)
Hypertension Hypertension, commonly called high blood pressure, is when the force of blood pumping through your arteries is too strong. Your arteries are the blood vessels that carry blood from your heart throughout your body. A blood pressure reading consists of a higher number over a lower number, such as 110/72. The higher number (systolic) is the pressure inside your arteries when your heart pumps. The lower number (diastolic) is the pressure inside your arteries when your heart relaxes. Ideally you want your blood pressure below 120/80. Hypertension forces your heart to work harder to pump blood. Your arteries may become narrow or stiff. Having untreated or uncontrolled hypertension can cause heart attack, stroke, kidney disease, and other problems. RISK FACTORS Some risk factors for high blood pressure are controllable. Others are not.  Risk factors you cannot control include:   Race. You may be at higher risk if you are African American.  Age. Risk increases with age.  Gender. Men are at higher risk than women before age 45 years. After age 65, women are at higher risk than men. Risk factors you can control include:  Not getting enough exercise or physical activity.  Being overweight.  Getting too much fat, sugar, calories, or salt in your diet.  Drinking too much alcohol. SIGNS AND SYMPTOMS Hypertension does not usually cause signs or symptoms. Extremely high blood pressure (hypertensive crisis) may cause headache, anxiety, shortness of breath, and nosebleed. DIAGNOSIS To check if you have hypertension, your health care provider will measure your blood pressure while you are seated, with your arm held at the level of your heart. It should be measured at least twice using the same arm. Certain conditions can cause a difference in blood pressure between your right and left arms. A blood pressure reading that is higher than normal on one occasion does not mean that you need treatment. If  it is not clear whether you have high blood pressure, you may be asked to return on a different day to have your blood pressure checked again. Or, you may be asked to monitor your blood pressure at home for 1 or more weeks. TREATMENT Treating high blood pressure includes making lifestyle changes and possibly taking medicine. Living a healthy lifestyle can help lower high blood pressure. You may need to change some of your habits. Lifestyle changes may include:  Following the DASH diet. This diet is high in fruits, vegetables, and whole grains. It is low in salt, red meat, and added sugars.  Keep your sodium intake below 2,300 mg per day.  Getting at least 30-45 minutes of aerobic exercise at least 4 times per week.  Losing weight if necessary.  Not smoking.  Limiting alcoholic beverages.  Learning ways to reduce stress. Your health care provider may prescribe medicine if lifestyle changes are not enough to get your blood pressure under control, and if one of the following is true:  You are 18-59 years of age and your systolic blood pressure is above 140.  You are 60 years of age or older, and your systolic blood pressure is above 150.  Your diastolic blood pressure is above 90.  You have diabetes, and your systolic blood pressure is over 140 or your diastolic blood pressure is over 90.  You have kidney disease and your blood pressure is above 140/90.  You have heart disease and your blood pressure is above 140/90. Your personal target blood pressure may vary depending on your medical conditions, your age, and other factors. HOME CARE INSTRUCTIONS    Have your blood pressure rechecked as directed by your health care provider.   Take medicines only as directed by your health care provider. Follow the directions carefully. Blood pressure medicines must be taken as prescribed. The medicine does not work as well when you skip doses. Skipping doses also puts you at risk for  problems.  Do not smoke.   Monitor your blood pressure at home as directed by your health care provider. SEEK MEDICAL CARE IF:   You think you are having a reaction to medicines taken.  You have recurrent headaches or feel dizzy.  You have swelling in your ankles.  You have trouble with your vision. SEEK IMMEDIATE MEDICAL CARE IF:  You develop a severe headache or confusion.  You have unusual weakness, numbness, or feel faint.  You have severe chest or abdominal pain.  You vomit repeatedly.  You have trouble breathing. MAKE SURE YOU:   Understand these instructions.  Will watch your condition.  Will get help right away if you are not doing well or get worse.   This information is not intended to replace advice given to you by your health care provider. Make sure you discuss any questions you have with your health care provider.   Document Released: 11/23/2005 Document Revised: 04/09/2015 Document Reviewed: 09/15/2013 Elsevier Interactive Patient Education 2016 [employer]  

## 2015-10-02 NOTE — Progress Notes (Signed)
Pre visit review using our clinic review tool, if applicable. No additional management support is needed unless otherwise documented below in the visit note. 

## 2015-10-03 NOTE — Assessment & Plan Note (Signed)
His blood pressure is well-controlled 

## 2015-10-03 NOTE — Progress Notes (Signed)
Subjective:  Patient ID: Robert Mccall, male    DOB: [birth year]  Age: 64 y.o. MRN: [id number]  CC: Hypertension   HPI Robert Mccall presents for a follow-up on hypertension and refill of medications for ADHD but he also informs me that he has become aware that he thrashes and moves throughout the night. He is wearing a fitbit. During his sleep and his fitbit has informed him that he is having fitful movements about 8-10 times per night.  Outpatient Prescriptions Prior to Visit  Medication Sig Dispense Refill  . alfuzosin (UROXATRAL) 10 MG 24 hr tablet Take 1 tablet (10 mg total) by mouth daily with breakfast. 90 tablet 3  . aspirin 81 MG tablet Take 81 mg by mouth daily.      . Cholecalciferol (VITAMIN D) 2000 UNITS CAPS Take 1 capsule (2,000 Units total) by mouth 1 dose over 46 hours. 30 capsule 11  . DULoxetine (CYMBALTA) 30 MG capsule TAKE 1 CAPSULE DAILY. 30 capsule 11  . esomeprazole (NEXIUM) 40 MG capsule Take 1 capsule (40 mg total) by mouth daily. 90 capsule 3  . finasteride (PROSCAR) 5 MG tablet Take 1 tablet (5 mg total) by mouth daily. 90 tablet 3  . gabapentin (NEURONTIN) 300 MG capsule Take 1 capsule (300 mg total) by mouth 3 (three) times daily. 270 capsule 1  . hydrochlorothiazide (MICROZIDE) 12.5 MG capsule Take 1 capsule (12.5 mg total) by mouth daily. 90 capsule 3  . KRILL OIL 1000 MG CAPS Take 1 capsule by mouth daily.      Marland Kitchen lamoTRIgine (LAMICTAL) 25 MG tablet Take 3 tablets (75 mg total) by mouth 2 (two) times daily. 540 tablet 3  . niacin 500 MG tablet Take 500 mg by mouth daily with breakfast.      . simvastatin (ZOCOR) 40 MG tablet Take 1 tablet (40 mg total) by mouth every evening. 90 tablet 3  . Travoprost, BAK Free, (TRAVATAN) 0.004 % SOLN ophthalmic solution Place 1 drop into the right eye at bedtime.    . verapamil (CALAN-SR) 240 MG CR tablet Take 1 tablet (240 mg total) by mouth daily. 90 tablet 3  . Methylphenidate HCl ER, XR, 40 MG CP24 Take 40 mg by mouth  every morning. 30 capsule 0  . Methylphenidate HCl ER, XR, 40 MG CP24 Take 40 mg by mouth every morning. 30 capsule 0   No facility-administered medications prior to visit.    ROS Review of Systems  Constitutional: Negative.  Negative for fever, chills, diaphoresis, appetite change and fatigue.  HENT: Negative.   Eyes: Negative.   Respiratory: Negative.  Negative for cough, choking, chest tightness, shortness of breath and stridor.   Cardiovascular: Negative.  Negative for chest pain, palpitations and leg swelling.  Gastrointestinal: Negative.  Negative for nausea, vomiting, abdominal pain, diarrhea and constipation.  Endocrine: Negative.   Genitourinary: Negative.   Musculoskeletal: Positive for arthralgias. Negative for myalgias, back pain and joint swelling.  Skin: Negative.  Negative for color change and rash.  Allergic/Immunologic: Negative.   Neurological: Negative.  Negative for dizziness, tremors, syncope, speech difficulty, weakness, light-headedness and headaches.  Hematological: Negative.  Negative for adenopathy. Does not bruise/bleed easily.  Psychiatric/Behavioral: Positive for decreased concentration. Negative for suicidal ideas, hallucinations, behavioral problems, confusion, sleep disturbance, self-injury, dysphoric mood and agitation. The patient is not nervous/anxious and is not hyperactive.     Objective:  BP 140/88 mmHg  Pulse 90  Temp(Src) 97.6 F (36.4 C) (Oral)  Resp 16  Ht 5\' 4"  (1.626 m)  Wt 174 lb (78.926 kg)  BMI 29.85 kg/m2  SpO2 97%  BP Readings from Last 3 Encounters:  10/02/15 140/88  06/27/15 120/78  05/07/15 140/80    Wt Readings from Last 3 Encounters:  10/02/15 174 lb (78.926 kg)  06/27/15 172 lb (78.019 kg)  05/07/15 179 lb (81.194 kg)    Physical Exam  Constitutional: He is oriented to person, place, and time. He appears well-developed and well-nourished. No distress.  HENT:  Head: Normocephalic and atraumatic.  Mouth/Throat:  Oropharynx is clear and moist. No oropharyngeal exudate.  Eyes: Conjunctivae are normal. Right eye exhibits no discharge. Left eye exhibits no discharge. No scleral icterus.  Neck: Normal range of motion. Neck supple. No JVD present. No tracheal deviation present. No thyromegaly present.  Cardiovascular: Normal rate, regular rhythm, normal heart sounds and intact distal pulses.  Exam reveals no gallop and no friction rub.   No murmur heard. Pulmonary/Chest: Effort normal and breath sounds normal. No stridor. No respiratory distress. He has no wheezes. He has no rales. He exhibits no tenderness.  Abdominal: Soft. Bowel sounds are normal. He exhibits no distension and no mass. There is no tenderness. There is no rebound and no guarding.  Musculoskeletal: Normal range of motion. He exhibits no edema or tenderness.  Lymphadenopathy:    He has no cervical adenopathy.  Neurological: He is oriented to person, place, and time.  Skin: Skin is warm and dry. No rash noted. He is not diaphoretic. No erythema. No pallor.  Psychiatric: He has a normal mood and affect. His behavior is normal. Judgment and thought content normal.  Vitals reviewed.   Lab Results  Component Value Date   WBC 6.0 08/02/2015   HGB 14.4 08/02/2015   HCT 42 08/02/2015   PLT 270 08/02/2015   GLUCOSE 106* 08/09/2014   CHOL 104 08/02/2015   TRIG 71 08/02/2015   HDL 40 08/02/2015   LDLCALC 50 08/02/2015   ALT 27 08/02/2015   AST 30 08/02/2015   NA 139 08/02/2015   K 3.8 08/02/2015   CL 103 08/09/2014   CREATININE 1.1 08/02/2015   BUN 13 08/02/2015   CO2 29 08/09/2014   TSH 1.56 08/02/2015   PSA 0.79 08/09/2014   HGBA1C 5.5 08/02/2015    Mr Abdomen W Wo Contrast  10/01/2014  CLINICAL DATA:  Evaluate renal mass. EXAM: MRI ABDOMEN WITHOUT AND WITH CONTRAST TECHNIQUE: Multiplanar multisequence MR imaging of the abdomen was performed both before and after the administration of intravenous contrast. CONTRAST:  33mL  MULTIHANCE GADOBENATE DIMEGLUMINE 529 MG/ML IV SOLN COMPARISON:  04/02/2014 FINDINGS: Lower chest:  No pleural effusion identified. Hepatobiliary: Hepatic steatosis identified. Cyst in the lateral segment of left lobe of liver measures 9 mm. No suspicious enhancing liver abnormalities. The gallbladder appears normal. No biliary dilatation identified. Pancreas: Normal appearance of the pancreas. No pancreatic duct dilatation. Spleen: The spleen is unremarkable. Adrenals/Urinary Tract: Normal appearance of both adrenal glands. Multiple areas of scarring are identified within the right kidney. Within the inferior pole of the right kidney there is a exophytic lesion measuring 1.3 cm. This exhibits increased T1 signal. No enhancement identified within this lesion. No suspicious left renal abnormalities. Stomach/Bowel: The stomach and upper abdominal bowel loops are unremarkable. Vascular/Lymphatic: Normal caliber of the abdominal aorta. The SMV, portal venous confluence, portal vein and splenic vein are patent. No retroperitoneal adenopathy identified. No mesenteric adenopathy. Other: There is no ascites within the upper abdomen. Musculoskeletal: Normal signal  throughout the bone marrow. IMPRESSION: 1. No acute findings within the upper abdomen. 2. Benign Bosniak category 2 lesion is identified within the right kidney. 3. Hepatic steatosis. Electronically Signed   By: Kerby Moors M.D.   On: 10/01/2014 10:18    Assessment & Plan:   Gyasi was seen today for hypertension.  Diagnoses and all orders for this visit:  Restless legs syndrome with nocturnal myoclonus- I've asked him to see pulmonary to consider having a sleep study done and to be evaluated and treated for restless leg syndrome. -     Ambulatory referral to Pulmonology  Need for influenza vaccination -     Flu Vaccine QUAD 36+ mos IM  Attention deficit disorder- he is doing well on current dose of methylphenidate, will continue. -      Discontinue: Methylphenidate HCl ER, XR, 40 MG CP24; Take 40 mg by mouth every morning. -     Discontinue: Methylphenidate HCl ER, XR, 40 MG CP24; Take 40 mg by mouth every morning. -     Methylphenidate HCl ER, XR, 40 MG CP24; Take 40 mg by mouth every morning.  Depression, controlled  Other orders -     Cancel: Methylphenidate HCl ER, XR, 40 MG CP24; Take 40 mg by mouth every morning.   I am having Mr. Banner maintain his aspirin, Krill Oil, niacin, Vitamin D, Travoprost (BAK Free), hydrochlorothiazide, simvastatin, alfuzosin, esomeprazole, finasteride, lamoTRIgine, verapamil, gabapentin, DULoxetine, and Methylphenidate HCl ER (XR).  Meds ordered this encounter  Medications  . DISCONTD: Methylphenidate HCl ER, XR, 40 MG CP24    Sig: Take 40 mg by mouth every morning.    Dispense:  30 capsule    Refill:  0    May fill once every 30 days. Oct 2016  . DISCONTD: Methylphenidate HCl ER, XR, 40 MG CP24    Sig: Take 40 mg by mouth every morning.    Dispense:  30 capsule    Refill:  0    May fill once every 30 days. Nov 2016  . Methylphenidate HCl ER, XR, 40 MG CP24    Sig: Take 40 mg by mouth every morning.    Dispense:  30 capsule    Refill:  0    May fill once every 30 days. Dec 2016     Follow-up: Return in about 4 months (around 02/02/2016).  Scarlette Calico, MD

## 2015-12-25 ENCOUNTER — Encounter: Payer: Self-pay | Admitting: Internal Medicine

## 2016-01-03 ENCOUNTER — Encounter: Payer: Self-pay | Admitting: Internal Medicine

## 2016-01-04 ENCOUNTER — Other Ambulatory Visit: Payer: Self-pay | Admitting: Internal Medicine

## 2016-01-05 ENCOUNTER — Other Ambulatory Visit: Payer: Self-pay | Admitting: Internal Medicine

## 2016-01-05 DIAGNOSIS — F988 Other specified behavioral and emotional disorders with onset usually occurring in childhood and adolescence: Secondary | ICD-10-CM

## 2016-01-05 MED ORDER — METHYLPHENIDATE HCL ER (XR) 40 MG PO CP24: 40.0000 mg | ORAL_CAPSULE | ORAL | Status: DC

## 2016-01-07 ENCOUNTER — Other Ambulatory Visit: Payer: Self-pay | Admitting: Internal Medicine

## 2016-01-13 ENCOUNTER — Institutional Professional Consult (permissible substitution): Payer: PRIVATE HEALTH INSURANCE | Admitting: Pulmonary Disease

## 2016-01-21 ENCOUNTER — Encounter: Payer: Self-pay | Admitting: Internal Medicine

## 2016-01-27 MED ORDER — VERAPAMIL HCL ER 240 MG PO TBCR: 240.0000 mg | EXTENDED_RELEASE_TABLET | Freq: Every day | ORAL | Status: DC

## 2016-01-27 MED ORDER — GABAPENTIN 300 MG PO CAPS: 300.0000 mg | ORAL_CAPSULE | Freq: Three times a day (TID) | ORAL | Status: DC

## 2016-01-27 MED ORDER — ESOMEPRAZOLE MAGNESIUM 40 MG PO CPDR: 40.0000 mg | DELAYED_RELEASE_CAPSULE | Freq: Every day | ORAL | Status: DC

## 2016-01-27 MED ORDER — ALFUZOSIN HCL ER 10 MG PO TB24: 10.0000 mg | ORAL_TABLET | Freq: Every day | ORAL | Status: DC

## 2016-01-27 MED ORDER — FINASTERIDE 5 MG PO TABS: 5.0000 mg | ORAL_TABLET | Freq: Every day | ORAL | Status: DC

## 2016-01-27 MED ORDER — LAMOTRIGINE 25 MG PO TABS: 75.0000 mg | ORAL_TABLET | Freq: Two times a day (BID) | ORAL | Status: DC

## 2016-02-03 ENCOUNTER — Encounter: Payer: Self-pay | Admitting: Internal Medicine

## 2016-02-03 ENCOUNTER — Other Ambulatory Visit (INDEPENDENT_AMBULATORY_CARE_PROVIDER_SITE_OTHER): Payer: PRIVATE HEALTH INSURANCE

## 2016-02-03 ENCOUNTER — Ambulatory Visit (INDEPENDENT_AMBULATORY_CARE_PROVIDER_SITE_OTHER): Payer: PRIVATE HEALTH INSURANCE | Admitting: Internal Medicine

## 2016-02-03 VITALS — BP 126/80 | HR 98 | Temp 97.8°F | Resp 16 | Ht 64.0 in | Wt 179.0 lb

## 2016-02-03 DIAGNOSIS — E785 Hyperlipidemia, unspecified: Secondary | ICD-10-CM | POA: Diagnosis not present

## 2016-02-03 DIAGNOSIS — I10 Essential (primary) hypertension: Secondary | ICD-10-CM | POA: Diagnosis not present

## 2016-02-03 DIAGNOSIS — Z0000 Encounter for general adult medical examination without abnormal findings: Secondary | ICD-10-CM | POA: Insufficient documentation

## 2016-02-03 DIAGNOSIS — R7303 Prediabetes: Secondary | ICD-10-CM

## 2016-02-03 LAB — URINALYSIS, ROUTINE W REFLEX MICROSCOPIC
Bilirubin Urine: NEGATIVE
HGB URINE DIPSTICK: NEGATIVE
Ketones, ur: NEGATIVE
Leukocytes, UA: NEGATIVE
Nitrite: NEGATIVE
RBC / HPF: NONE SEEN (ref 0–?)
Specific Gravity, Urine: 1.025 (ref 1.000–1.030)
TOTAL PROTEIN, URINE-UPE24: NEGATIVE
Urine Glucose: NEGATIVE
Urobilinogen, UA: 0.2 (ref 0.0–1.0)
pH: 6 (ref 5.0–8.0)

## 2016-02-03 LAB — CBC WITH DIFFERENTIAL/PLATELET
BASOS PCT: 0.6 % (ref 0.0–3.0)
Basophils Absolute: 0 10*3/uL (ref 0.0–0.1)
EOS PCT: 2 % (ref 0.0–5.0)
Eosinophils Absolute: 0.1 10*3/uL (ref 0.0–0.7)
HCT: 43.7 % (ref 39.0–52.0)
HEMOGLOBIN: 15 g/dL (ref 13.0–17.0)
LYMPHS ABS: 2.5 10*3/uL (ref 0.7–4.0)
Lymphocytes Relative: 34.2 % (ref 12.0–46.0)
MCHC: 34.3 g/dL (ref 30.0–36.0)
MCV: 87.5 fl (ref 78.0–100.0)
MONO ABS: 0.7 10*3/uL (ref 0.1–1.0)
Monocytes Relative: 9.4 % (ref 3.0–12.0)
NEUTROS ABS: 3.9 10*3/uL (ref 1.4–7.7)
Neutrophils Relative %: 53.8 % (ref 43.0–77.0)
PLATELETS: 250 10*3/uL (ref 150.0–400.0)
RBC: 4.99 Mil/uL (ref 4.22–5.81)
RDW: 12.7 % (ref 11.5–15.5)
WBC: 7.2 10*3/uL (ref 4.0–10.5)

## 2016-02-03 LAB — LIPID PANEL
CHOL/HDL RATIO: 3
Cholesterol: 133 mg/dL (ref 0–200)
HDL: 43.1 mg/dL (ref >39.00)
LDL Cholesterol: 67 mg/dL (ref 0–99)
NONHDL: 90.37
Triglycerides: 117 mg/dL (ref 0.0–149.0)
VLDL: 23.4 mg/dL (ref 0.0–40.0)

## 2016-02-03 LAB — COMPREHENSIVE METABOLIC PANEL
ALT: 31 U/L (ref 0–53)
AST: 29 U/L (ref 0–37)
Albumin: 4.5 g/dL (ref 3.5–5.2)
Alkaline Phosphatase: 57 U/L (ref 39–117)
BUN: 18 mg/dL (ref 6–23)
CHLORIDE: 101 meq/L (ref 96–112)
CO2: 31 meq/L (ref 19–32)
Calcium: 9.2 mg/dL (ref 8.4–10.5)
Creatinine, Ser: 1.09 mg/dL (ref 0.40–1.50)
GFR: 72.19 mL/min (ref >60.00)
GLUCOSE: 131 mg/dL — AB (ref 70–99)
POTASSIUM: 3.6 meq/L (ref 3.5–5.1)
SODIUM: 139 meq/L (ref 135–145)
Total Bilirubin: 0.6 mg/dL (ref 0.2–1.2)
Total Protein: 7.1 g/dL (ref 6.0–8.3)

## 2016-02-03 LAB — HEPATITIS C ANTIBODY: HCV AB: NEGATIVE

## 2016-02-03 LAB — TSH: TSH: 1.27 u[IU]/mL (ref 0.35–4.50)

## 2016-02-03 LAB — FECAL OCCULT BLOOD, GUAIAC: FECAL OCCULT BLD: NEGATIVE

## 2016-02-03 LAB — HIV ANTIBODY (ROUTINE TESTING W REFLEX): HIV: NONREACTIVE

## 2016-02-03 LAB — PSA: PSA: 0.38 ng/mL (ref 0.10–4.00)

## 2016-02-03 LAB — HEMOGLOBIN A1C: Hgb A1c MFr Bld: 5.9 % (ref 4.6–6.5)

## 2016-02-03 MED ORDER — SIMVASTATIN 40 MG PO TABS: 40.0000 mg | ORAL_TABLET | Freq: Every evening | ORAL | Status: DC

## 2016-02-03 MED ORDER — HYDROCHLOROTHIAZIDE 12.5 MG PO CAPS: 12.5000 mg | ORAL_CAPSULE | Freq: Every day | ORAL | Status: DC

## 2016-02-03 NOTE — Patient Instructions (Signed)

## 2016-02-03 NOTE — Progress Notes (Signed)
Pre visit review using our clinic review tool, if applicable. No additional management support is needed unless otherwise documented below in the visit note. 

## 2016-02-03 NOTE — Progress Notes (Signed)
Subjective:  Patient ID: Robert Mccall, male    DOB: Dec 04, 1951  Age: 65 y.o. MRN: [id number]  CC: Hyperlipidemia; Hypertension; and Annual Exam   HPI Robert Mccall presents for a CPX.  Pt states ADD status overall stable on current meds with overall good compliance and tolerability, and good effectiveness with respect to ability for concentration and task completion.  He needs a checkup on his blood pressure. He tells me it has been well controlled on the combination of verapamil and hydrochlorothiazide. He has had no secondary signs of hypertension such as headache, blurred vision, chest pain, shortness of breath, or edema.  Outpatient Prescriptions Prior to Visit  Medication Sig Dispense Refill  . alfuzosin (UROXATRAL) 10 MG 24 hr tablet Take 1 tablet (10 mg total) by mouth daily with breakfast. 90 tablet 1  . aspirin 81 MG tablet Take 81 mg by mouth daily.      . Cholecalciferol (VITAMIN D) 2000 UNITS CAPS Take 1 capsule (2,000 Units total) by mouth 1 dose over 46 hours. 30 capsule 11  . DULoxetine (CYMBALTA) 30 MG capsule TAKE 1 CAPSULE DAILY. 30 capsule 5  . esomeprazole (NEXIUM) 40 MG capsule Take 1 capsule (40 mg total) by mouth daily. 90 capsule 1  . finasteride (PROSCAR) 5 MG tablet Take 1 tablet (5 mg total) by mouth daily. 90 tablet 1  . gabapentin (NEURONTIN) 300 MG capsule Take 1 capsule (300 mg total) by mouth 3 (three) times daily. 270 capsule 1  . KRILL OIL 1000 MG CAPS Take 1 capsule by mouth daily.      Marland Kitchen lamoTRIgine (LAMICTAL) 25 MG tablet Take 3 tablets (75 mg total) by mouth 2 (two) times daily. 540 tablet 1  . Methylphenidate HCl ER, XR, 40 MG CP24 Take 40 mg by mouth every morning. 30 capsule 0  . niacin 500 MG tablet Take 500 mg by mouth daily with breakfast.      . Travoprost, BAK Free, (TRAVATAN) 0.004 % SOLN ophthalmic solution Place 1 drop into the right eye at bedtime.    . verapamil (CALAN-SR) 240 MG CR tablet Take 1 tablet (240 mg total) by mouth daily. 90  tablet 1  . hydrochlorothiazide (MICROZIDE) 12.5 MG capsule Take 1 capsule (12.5 mg total) by mouth daily. 90 capsule 3  . simvastatin (ZOCOR) 40 MG tablet Take 1 tablet (40 mg total) by mouth every evening. 90 tablet 3   No facility-administered medications prior to visit.    ROS Review of Systems  Constitutional: Negative.  Negative for fever, chills, diaphoresis, appetite change and fatigue.  HENT: Negative.   Eyes: Negative.   Respiratory: Negative.  Negative for cough, choking, chest tightness, shortness of breath and stridor.   Cardiovascular: Negative.  Negative for chest pain, palpitations and leg swelling.  Gastrointestinal: Negative.  Negative for nausea, vomiting, abdominal pain, diarrhea and constipation.  Endocrine: Negative.  Negative for polydipsia, polyphagia and polyuria.  Genitourinary: Negative.  Negative for dysuria, urgency, frequency, hematuria, flank pain, decreased urine volume, difficulty urinating and testicular pain.  Musculoskeletal: Negative.  Negative for myalgias, back pain, joint swelling and arthralgias.  Skin: Negative.  Negative for color change, pallor, rash and wound.  Allergic/Immunologic: Negative.   Neurological: Negative.  Negative for dizziness, tremors, weakness, light-headedness, numbness and headaches.  Hematological: Negative.  Negative for adenopathy. Does not bruise/bleed easily.  Psychiatric/Behavioral: Positive for decreased concentration. Negative for confusion, sleep disturbance, self-injury, dysphoric mood and agitation. The patient is not nervous/anxious and is not hyperactive.  Objective:  BP 126/80 mmHg  Pulse 98  Temp(Src) 97.8 F (36.6 C) (Oral)  Resp 16  Ht 5\' 4"  (1.626 m)  Wt 179 lb (81.194 kg)  BMI 30.71 kg/m2  SpO2 97%  BP Readings from Last 3 Encounters:  02/03/16 126/80  10/02/15 140/88  06/27/15 120/78    Wt Readings from Last 3 Encounters:  02/03/16 179 lb (81.194 kg)  10/02/15 174 lb (78.926 kg)    06/27/15 172 lb (78.019 kg)    Physical Exam  Constitutional: He is oriented to person, place, and time. He appears well-developed and well-nourished. No distress.  HENT:  Head: Normocephalic and atraumatic.  Mouth/Throat: Oropharynx is clear and moist. No oropharyngeal exudate.  Eyes: Conjunctivae are normal. Right eye exhibits no discharge. Left eye exhibits no discharge. No scleral icterus.  Neck: Normal range of motion. Neck supple. No JVD present. No tracheal deviation present. No thyromegaly present.  Cardiovascular: Normal rate, regular rhythm, normal heart sounds and intact distal pulses.  Exam reveals no gallop and no friction rub.   No murmur heard. EKG - NSR,  No LVH, normal EKG  Pulmonary/Chest: Effort normal and breath sounds normal. No stridor. No respiratory distress. He has no wheezes. He has no rales. He exhibits no tenderness.  Abdominal: Soft. Bowel sounds are normal. He exhibits no distension and no mass. There is no tenderness. There is no rebound and no guarding. Hernia confirmed negative in the right inguinal area and confirmed negative in the left inguinal area.  Genitourinary: Rectum normal and penis normal. Rectal exam shows no external hemorrhoid, no internal hemorrhoid, no fissure, no mass, no tenderness and anal tone normal. Prostate is enlarged (1+ smooth symm BPH). Prostate is not tender. Right testis shows no mass, no swelling and no tenderness. Right testis is descended. Left testis shows no mass, no swelling and no tenderness. Left testis is descended. Circumcised. No penile erythema or penile tenderness. No discharge found.  The left hemiscrotum is empty  Musculoskeletal: Normal range of motion. He exhibits no edema or tenderness.  Lymphadenopathy:    He has no cervical adenopathy.       Right: No inguinal adenopathy present.       Left: No inguinal adenopathy present.  Neurological: He is oriented to person, place, and time.  Skin: Skin is warm and dry. No  rash noted. He is not diaphoretic. No erythema. No pallor.  Psychiatric: He has a normal mood and affect. His behavior is normal. Judgment and thought content normal.  Vitals reviewed.   Lab Results  Component Value Date   WBC 7.2 02/03/2016   HGB 15.0 02/03/2016   HCT 43.7 02/03/2016   PLT 250.0 02/03/2016   GLUCOSE 131* 02/03/2016   CHOL 133 02/03/2016   TRIG 117.0 02/03/2016   HDL 43.10 02/03/2016   LDLCALC 67 02/03/2016   ALT 31 02/03/2016   AST 29 02/03/2016   NA 139 02/03/2016   K 3.6 02/03/2016   CL 101 02/03/2016   CREATININE 1.09 02/03/2016   BUN 18 02/03/2016   CO2 31 02/03/2016   TSH 1.27 02/03/2016   PSA 0.38 02/03/2016   HGBA1C 5.9 02/03/2016    Mr Abdomen W Wo Contrast  10/01/2014  CLINICAL DATA:  Evaluate renal mass. EXAM: MRI ABDOMEN WITHOUT AND WITH CONTRAST TECHNIQUE: Multiplanar multisequence MR imaging of the abdomen was performed both before and after the administration of intravenous contrast. CONTRAST:  25mL MULTIHANCE GADOBENATE DIMEGLUMINE 529 MG/ML IV SOLN COMPARISON:  04/02/2014 FINDINGS: Lower chest:  No pleural effusion identified. Hepatobiliary: Hepatic steatosis identified. Cyst in the lateral segment of left lobe of liver measures 9 mm. No suspicious enhancing liver abnormalities. The gallbladder appears normal. No biliary dilatation identified. Pancreas: Normal appearance of the pancreas. No pancreatic duct dilatation. Spleen: The spleen is unremarkable. Adrenals/Urinary Tract: Normal appearance of both adrenal glands. Multiple areas of scarring are identified within the right kidney. Within the inferior pole of the right kidney there is a exophytic lesion measuring 1.3 cm. This exhibits increased T1 signal. No enhancement identified within this lesion. No suspicious left renal abnormalities. Stomach/Bowel: The stomach and upper abdominal bowel loops are unremarkable. Vascular/Lymphatic: Normal caliber of the abdominal aorta. The SMV, portal venous  confluence, portal vein and splenic vein are patent. No retroperitoneal adenopathy identified. No mesenteric adenopathy. Other: There is no ascites within the upper abdomen. Musculoskeletal: Normal signal throughout the bone marrow. IMPRESSION: 1. No acute findings within the upper abdomen. 2. Benign Bosniak category 2 lesion is identified within the right kidney. 3. Hepatic steatosis. Electronically Signed   By: Kerby Moors M.D.   On: 10/01/2014 10:18    Assessment & Plan:   Delano was seen today for hyperlipidemia, hypertension and annual exam.  Diagnoses and all orders for this visit:  Prediabetes- this does not need to be treated with the medication, will continue to monitor and he will work on his lifestyle modifications. -     Hemoglobin A1c; Future  Routine general medical examination at a health care facility- his vaccines were reviewed, exam completed, labs ordered and reviewed, his colonoscopy is up-to-date, patient education material was given. -     Lipid panel; Future -     Hepatitis C antibody; Future -     HIV antibody; Future -     Comprehensive metabolic panel; Future -     CBC with Differential/Platelet; Future -     TSH; Future -     Urinalysis, Routine w reflex microscopic (not at [facility]); Future -     PSA; Future -     EKG 12-Lead  Essential hypertension- his blood pressures well controlled, electrolytes and renal function are stable. -     hydrochlorothiazide (MICROZIDE) 12.5 MG capsule; Take 1 capsule (12.5 mg total) by mouth daily.  Hyperlipidemia with target LDL less than 130- he has achieved his LDL goal is doing well on the statin. -     simvastatin (ZOCOR) 40 MG tablet; Take 1 tablet (40 mg total) by mouth every evening.  I am having Mr. Baumgardner maintain his aspirin, Krill Oil, niacin, Vitamin D, Travoprost (BAK Free), Methylphenidate HCl ER (XR), DULoxetine, lamoTRIgine, esomeprazole, finasteride, gabapentin, verapamil, alfuzosin, hydrochlorothiazide, and  simvastatin.  Meds ordered this encounter  Medications  . hydrochlorothiazide (MICROZIDE) 12.5 MG capsule    Sig: Take 1 capsule (12.5 mg total) by mouth daily.    Dispense:  90 capsule    Refill:  3  . simvastatin (ZOCOR) 40 MG tablet    Sig: Take 1 tablet (40 mg total) by mouth every evening.    Dispense:  90 tablet    Refill:  3     Follow-up: Return in about 4 months (around 06/02/2016).  Scarlette Calico, MD

## 2016-02-04 ENCOUNTER — Encounter: Payer: Self-pay | Admitting: Internal Medicine

## 2016-04-06 ENCOUNTER — Encounter: Payer: Self-pay | Admitting: Internal Medicine

## 2016-04-09 ENCOUNTER — Encounter: Payer: Self-pay | Admitting: Internal Medicine

## 2016-04-09 ENCOUNTER — Ambulatory Visit (INDEPENDENT_AMBULATORY_CARE_PROVIDER_SITE_OTHER): Payer: PRIVATE HEALTH INSURANCE | Admitting: Internal Medicine

## 2016-04-09 VITALS — BP 110/62 | HR 100 | Temp 98.5°F | Resp 16 | Ht 64.0 in | Wt 173.0 lb

## 2016-04-09 DIAGNOSIS — H8303 Labyrinthitis, bilateral: Secondary | ICD-10-CM | POA: Diagnosis not present

## 2016-04-09 DIAGNOSIS — Z23 Encounter for immunization: Secondary | ICD-10-CM | POA: Diagnosis not present

## 2016-04-09 DIAGNOSIS — F909 Attention-deficit hyperactivity disorder, unspecified type: Secondary | ICD-10-CM

## 2016-04-09 DIAGNOSIS — G252 Other specified forms of tremor: Secondary | ICD-10-CM

## 2016-04-09 DIAGNOSIS — F988 Other specified behavioral and emotional disorders with onset usually occurring in childhood and adolescence: Secondary | ICD-10-CM

## 2016-04-09 DIAGNOSIS — H8309 Labyrinthitis, unspecified ear: Secondary | ICD-10-CM | POA: Insufficient documentation

## 2016-04-09 MED ORDER — METHYLPREDNISOLONE ACETATE 80 MG/ML IJ SUSP
120.0000 mg | Freq: Once | INTRAMUSCULAR | Status: AC
  Administered 2016-04-09: 120 mg via INTRAMUSCULAR

## 2016-04-09 MED ORDER — METHYLPHENIDATE HCL ER (XR) 40 MG PO CP24: 40.0000 mg | ORAL_CAPSULE | ORAL | Status: DC

## 2016-04-09 MED ORDER — MECLIZINE HCL 12.5 MG PO TABS: 12.5000 mg | ORAL_TABLET | Freq: Three times a day (TID) | ORAL | Status: DC | PRN

## 2016-04-09 NOTE — Progress Notes (Signed)
Subjective:  Patient ID: Robert Mccall, male    DOB: 04-01-1951  Age: 65 y.o. MRN: [id number]  CC: Dizziness   HPI Robert Mccall presents for a one-week history of dizziness and vertigo. He also complains of mild ataxia. He has had some discomfort in his right ear and complains of tinnitus. He wears hearing aids and says his hearing level has not diminished. He denies headache, nausea, vomiting, paresthesias. He complains of muscle twitches and tremors over the last year as well.  He has been under quite a bit of stress at work over the last year and some of his coworkers have complain that he is forgetful and has short-term memory loss. He wants to see a neurologist. He has ADHD and is doing well on methylphenidate.  Outpatient Prescriptions Prior to Visit  Medication Sig Dispense Refill  . alfuzosin (UROXATRAL) 10 MG 24 hr tablet Take 1 tablet (10 mg total) by mouth daily with breakfast. 90 tablet 1  . aspirin 81 MG tablet Take 81 mg by mouth daily.      . Cholecalciferol (VITAMIN D) 2000 UNITS CAPS Take 1 capsule (2,000 Units total) by mouth 1 dose over 46 hours. 30 capsule 11  . DULoxetine (CYMBALTA) 30 MG capsule TAKE 1 CAPSULE DAILY. 30 capsule 5  . esomeprazole (NEXIUM) 40 MG capsule Take 1 capsule (40 mg total) by mouth daily. 90 capsule 1  . finasteride (PROSCAR) 5 MG tablet Take 1 tablet (5 mg total) by mouth daily. 90 tablet 1  . gabapentin (NEURONTIN) 300 MG capsule Take 1 capsule (300 mg total) by mouth 3 (three) times daily. 270 capsule 1  . hydrochlorothiazide (MICROZIDE) 12.5 MG capsule Take 1 capsule (12.5 mg total) by mouth daily. 90 capsule 3  . KRILL OIL 1000 MG CAPS Take 1 capsule by mouth daily.      Marland Kitchen lamoTRIgine (LAMICTAL) 25 MG tablet Take 3 tablets (75 mg total) by mouth 2 (two) times daily. 540 tablet 1  . niacin 500 MG tablet Take 500 mg by mouth daily with breakfast.      . simvastatin (ZOCOR) 40 MG tablet Take 1 tablet (40 mg total) by mouth every evening. 90  tablet 3  . Travoprost, BAK Free, (TRAVATAN) 0.004 % SOLN ophthalmic solution Place 1 drop into the right eye at bedtime.    . verapamil (CALAN-SR) 240 MG CR tablet Take 1 tablet (240 mg total) by mouth daily. 90 tablet 1  . Methylphenidate HCl ER, XR, 40 MG CP24 Take 40 mg by mouth every morning. 30 capsule 0   No facility-administered medications prior to visit.    ROS Review of Systems  Constitutional: Negative.  Negative for fever, chills, diaphoresis, appetite change and fatigue.  HENT: Positive for ear pain and tinnitus. Negative for congestion, drooling, facial swelling, hearing loss, sinus pressure, sneezing, sore throat, trouble swallowing and voice change.   Eyes: Negative.  Negative for visual disturbance.  Respiratory: Negative.  Negative for cough, choking, chest tightness, shortness of breath and stridor.   Cardiovascular: Negative.  Negative for chest pain, palpitations and leg swelling.  Gastrointestinal: Negative.  Negative for nausea, vomiting, abdominal pain, diarrhea, constipation and blood in stool.  Genitourinary: Negative.  Negative for difficulty urinating.  Musculoskeletal: Negative.   Skin: Negative.  Negative for color change and rash.  Neurological: Positive for dizziness. Negative for tremors, seizures, syncope, facial asymmetry, speech difficulty, weakness, light-headedness, numbness and headaches.  Hematological: Negative.  Negative for adenopathy. Does not bruise/bleed easily.  Psychiatric/Behavioral: Positive for decreased concentration. Negative for suicidal ideas, confusion, sleep disturbance and dysphoric mood. The patient is not nervous/anxious.     Objective:  BP 110/62 mmHg  Pulse 100  Temp(Src) 98.5 F (36.9 C) (Oral)  Resp 16  Ht 5\' 4"  (1.626 m)  Wt 173 lb (78.472 kg)  BMI 29.68 kg/m2  SpO2 96%  BP Readings from Last 3 Encounters:  04/09/16 110/62  02/03/16 126/80  10/02/15 140/88    Wt Readings from Last 3 Encounters:  04/09/16 173  lb (78.472 kg)  02/03/16 179 lb (81.194 kg)  10/02/15 174 lb (78.926 kg)    Physical Exam  Constitutional: He is oriented to person, place, and time. No distress.  HENT:  Head: Normocephalic and atraumatic.  Mouth/Throat: Oropharynx is clear and moist. No oropharyngeal exudate.  Eyes: Conjunctivae and EOM are normal. Pupils are equal, round, and reactive to light. Right eye exhibits no discharge. Left eye exhibits no discharge. No scleral icterus.  Neck: Normal range of motion. Neck supple. No JVD present. No tracheal deviation present. No thyromegaly present.  Cardiovascular: Normal rate, regular rhythm, normal heart sounds and intact distal pulses.  Exam reveals no gallop and no friction rub.   No murmur heard. Pulmonary/Chest: Effort normal and breath sounds normal. No stridor. No respiratory distress. He has no wheezes. He has no rales. He exhibits no tenderness.  Abdominal: Soft. Bowel sounds are normal. He exhibits no distension and no mass. There is no tenderness. There is no rebound and no guarding.  Musculoskeletal: Normal range of motion. He exhibits no edema or tenderness.  Lymphadenopathy:    He has no cervical adenopathy.  Neurological: He is alert and oriented to person, place, and time. He has normal strength. He displays tremor. He displays no atrophy and normal reflexes. No cranial nerve deficit or sensory deficit. He exhibits normal muscle tone. He displays no seizure activity. Coordination and gait normal. He displays no Babinski's sign on the right side. He displays no Babinski's sign on the left side.  Reflex Scores:      Tricep reflexes are 1+ on the right side and 1+ on the left side.      Bicep reflexes are 1+ on the right side and 1+ on the left side.      Brachioradialis reflexes are 1+ on the right side and 1+ on the left side.      Patellar reflexes are 1+ on the right side and 1+ on the left side.      Achilles reflexes are 1+ on the right side and 1+ on the left  side. He has gross tremors and twitches in both upper and lower extremities. In both feet he has 3 beat clonus.  Skin: Skin is warm and dry. No rash noted. He is not diaphoretic. No erythema. No pallor.  Psychiatric: He has a normal mood and affect. His behavior is normal. Judgment and thought content normal.  Vitals reviewed.   Lab Results  Component Value Date   WBC 7.2 02/03/2016   HGB 15.0 02/03/2016   HCT 43.7 02/03/2016   PLT 250.0 02/03/2016   GLUCOSE 131* 02/03/2016   CHOL 133 02/03/2016   TRIG 117.0 02/03/2016   HDL 43.10 02/03/2016   LDLCALC 67 02/03/2016   ALT 31 02/03/2016   AST 29 02/03/2016   NA 139 02/03/2016   K 3.6 02/03/2016   CL 101 02/03/2016   CREATININE 1.09 02/03/2016   BUN 18 02/03/2016   CO2 31 02/03/2016  TSH 1.27 02/03/2016   PSA 0.38 02/03/2016   HGBA1C 5.9 02/03/2016    Mr Abdomen W Wo Contrast  10/01/2014  CLINICAL DATA:  Evaluate renal mass. EXAM: MRI ABDOMEN WITHOUT AND WITH CONTRAST TECHNIQUE: Multiplanar multisequence MR imaging of the abdomen was performed both before and after the administration of intravenous contrast. CONTRAST:  63mL MULTIHANCE GADOBENATE DIMEGLUMINE 529 MG/ML IV SOLN COMPARISON:  04/02/2014 FINDINGS: Lower chest:  No pleural effusion identified. Hepatobiliary: Hepatic steatosis identified. Cyst in the lateral segment of left lobe of liver measures 9 mm. No suspicious enhancing liver abnormalities. The gallbladder appears normal. No biliary dilatation identified. Pancreas: Normal appearance of the pancreas. No pancreatic duct dilatation. Spleen: The spleen is unremarkable. Adrenals/Urinary Tract: Normal appearance of both adrenal glands. Multiple areas of scarring are identified within the right kidney. Within the inferior pole of the right kidney there is a exophytic lesion measuring 1.3 cm. This exhibits increased T1 signal. No enhancement identified within this lesion. No suspicious left renal abnormalities. Stomach/Bowel:  The stomach and upper abdominal bowel loops are unremarkable. Vascular/Lymphatic: Normal caliber of the abdominal aorta. The SMV, portal venous confluence, portal vein and splenic vein are patent. No retroperitoneal adenopathy identified. No mesenteric adenopathy. Other: There is no ascites within the upper abdomen. Musculoskeletal: Normal signal throughout the bone marrow. IMPRESSION: 1. No acute findings within the upper abdomen. 2. Benign Bosniak category 2 lesion is identified within the right kidney. 3. Hepatic steatosis. Electronically Signed   By: Kerby Moors M.D.   On: 10/01/2014 10:18    Assessment & Plan:   Lois was seen today for dizziness.  Diagnoses and all orders for this visit:  Attention deficit disorder- for now, I will continue his current dose of methylphenidate but will consider lowering his dose if his neurological workup is otherwise negative. -     Discontinue: Methylphenidate HCl ER, XR, 40 MG CP24; Take 40 mg by mouth every morning. -     Discontinue: Methylphenidate HCl ER, XR, 40 MG CP24; Take 40 mg by mouth every morning. -     Methylphenidate HCl ER, XR, 40 MG CP24; Take 40 mg by mouth every morning.  Viral labyrinthitis, bilateral- I do not see any focal neurological deficits today, I will treat this with meclizine and a course of Depo-Medrol. -     meclizine (ANTIVERT) 12.5 MG tablet; Take 1 tablet (12.5 mg total) by mouth 3 (three) times daily as needed for dizziness. -     methylPREDNISolone acetate (DEPO-MEDROL) injection 120 mg; Inject 1.5 mLs (120 mg total) into the muscle once.  Coarse tremors- I'm concerned this may be related to his methylphenidate therapy, I've asked him to see neurology for further evaluation. -     Ambulatory referral to Neurology  Need for vaccination with 13-polyvalent pneumococcal conjugate vaccine -     Pneumococcal conjugate vaccine 13-valent   I am having Mr. Eccher start on meclizine. I am also having him maintain his  aspirin, Krill Oil, niacin, Vitamin D, Travoprost (BAK Free), DULoxetine, lamoTRIgine, esomeprazole, finasteride, gabapentin, verapamil, alfuzosin, hydrochlorothiazide, simvastatin, and Methylphenidate HCl ER (XR). We administered methylPREDNISolone acetate.  Meds ordered this encounter  Medications  . DISCONTD: Methylphenidate HCl ER, XR, 40 MG CP24    Sig: Take 40 mg by mouth every morning.    Dispense:  30 capsule    Refill:  0    May fill once every 30 days. May  2017  . DISCONTD: Methylphenidate HCl ER, XR, 40 MG CP24  Sig: Take 40 mg by mouth every morning.    Dispense:  30 capsule    Refill:  0    May fill once every 30 days. June  2017  . Methylphenidate HCl ER, XR, 40 MG CP24    Sig: Take 40 mg by mouth every morning.    Dispense:  30 capsule    Refill:  0    May fill once every 30 days. July  2017  . meclizine (ANTIVERT) 12.5 MG tablet    Sig: Take 1 tablet (12.5 mg total) by mouth 3 (three) times daily as needed for dizziness.    Dispense:  30 tablet    Refill:  0  . methylPREDNISolone acetate (DEPO-MEDROL) injection 120 mg    Sig:      Follow-up: Return in about 3 weeks (around 04/30/2016).  Scarlette Calico, MD

## 2016-04-09 NOTE — Progress Notes (Signed)
Pre visit review using our clinic review tool, if applicable. No additional management support is needed unless otherwise documented below in the visit note. 

## 2016-04-09 NOTE — Patient Instructions (Signed)
Hypertension Hypertension, commonly called high blood pressure, is when the force of blood pumping through your arteries is too strong. Your arteries are the blood vessels that carry blood from your heart throughout your body. A blood pressure reading consists of a higher number over a lower number, such as 110/72. The higher number (systolic) is the pressure inside your arteries when your heart pumps. The lower number (diastolic) is the pressure inside your arteries when your heart relaxes. Ideally you want your blood pressure below 120/80. Hypertension forces your heart to work harder to pump blood. Your arteries may become narrow or stiff. Having untreated or uncontrolled hypertension can cause heart attack, stroke, kidney disease, and other problems. RISK FACTORS Some risk factors for high blood pressure are controllable. Others are not.  Risk factors you cannot control include:   Race. You may be at higher risk if you are African American.  Age. Risk increases with age.  Gender. Men are at higher risk than women before age 45 years. After age 65, women are at higher risk than men. Risk factors you can control include:  Not getting enough exercise or physical activity.  Being overweight.  Getting too much fat, sugar, calories, or salt in your diet.  Drinking too much alcohol. SIGNS AND SYMPTOMS Hypertension does not usually cause signs or symptoms. Extremely high blood pressure (hypertensive crisis) may cause headache, anxiety, shortness of breath, and nosebleed. DIAGNOSIS To check if you have hypertension, your health care provider will measure your blood pressure while you are seated, with your arm held at the level of your heart. It should be measured at least twice using the same arm. Certain conditions can cause a difference in blood pressure between your right and left arms. A blood pressure reading that is higher than normal on one occasion does not mean that you need treatment. If  it is not clear whether you have high blood pressure, you may be asked to return on a different day to have your blood pressure checked again. Or, you may be asked to monitor your blood pressure at home for 1 or more weeks. TREATMENT Treating high blood pressure includes making lifestyle changes and possibly taking medicine. Living a healthy lifestyle can help lower high blood pressure. You may need to change some of your habits. Lifestyle changes may include:  Following the DASH diet. This diet is high in fruits, vegetables, and whole grains. It is low in salt, red meat, and added sugars.  Keep your sodium intake below 2,300 mg per day.  Getting at least 30-45 minutes of aerobic exercise at least 4 times per week.  Losing weight if necessary.  Not smoking.  Limiting alcoholic beverages.  Learning ways to reduce stress. Your health care provider may prescribe medicine if lifestyle changes are not enough to get your blood pressure under control, and if one of the following is true:  You are 18-59 years of age and your systolic blood pressure is above 140.  You are 60 years of age or older, and your systolic blood pressure is above 150.  Your diastolic blood pressure is above 90.  You have diabetes, and your systolic blood pressure is over 140 or your diastolic blood pressure is over 90.  You have kidney disease and your blood pressure is above 140/90.  You have heart disease and your blood pressure is above 140/90. Your personal target blood pressure may vary depending on your medical conditions, your age, and other factors. HOME CARE INSTRUCTIONS    Have your blood pressure rechecked as directed by your health care provider.   Take medicines only as directed by your health care provider. Follow the directions carefully. Blood pressure medicines must be taken as prescribed. The medicine does not work as well when you skip doses. Skipping doses also puts you at risk for  problems.  Do not smoke.   Monitor your blood pressure at home as directed by your health care provider. SEEK MEDICAL CARE IF:   You think you are having a reaction to medicines taken.  You have recurrent headaches or feel dizzy.  You have swelling in your ankles.  You have trouble with your vision. SEEK IMMEDIATE MEDICAL CARE IF:  You develop a severe headache or confusion.  You have unusual weakness, numbness, or feel faint.  You have severe chest or abdominal pain.  You vomit repeatedly.  You have trouble breathing. MAKE SURE YOU:   Understand these instructions.  Will watch your condition.  Will get help right away if you are not doing well or get worse.   This information is not intended to replace advice given to you by your health care provider. Make sure you discuss any questions you have with your health care provider.   Document Released: 11/23/2005 Document Revised: 04/09/2015 Document Reviewed: 09/15/2013 Elsevier Interactive Patient Education 2016 [employer]  

## 2016-04-22 ENCOUNTER — Encounter: Payer: Self-pay | Admitting: Pulmonary Disease

## 2016-04-27 ENCOUNTER — Ambulatory Visit: Payer: PRIVATE HEALTH INSURANCE | Admitting: Internal Medicine

## 2016-05-18 ENCOUNTER — Ambulatory Visit (INDEPENDENT_AMBULATORY_CARE_PROVIDER_SITE_OTHER): Payer: PRIVATE HEALTH INSURANCE | Admitting: Neurology

## 2016-05-18 ENCOUNTER — Encounter: Payer: Self-pay | Admitting: Neurology

## 2016-05-18 ENCOUNTER — Other Ambulatory Visit: Payer: PRIVATE HEALTH INSURANCE

## 2016-05-18 VITALS — BP 158/100 | HR 102 | Ht 64.5 in | Wt 170.0 lb

## 2016-05-18 DIAGNOSIS — G609 Hereditary and idiopathic neuropathy, unspecified: Secondary | ICD-10-CM

## 2016-05-18 DIAGNOSIS — M48061 Spinal stenosis, lumbar region without neurogenic claudication: Secondary | ICD-10-CM

## 2016-05-18 DIAGNOSIS — M545 Low back pain: Secondary | ICD-10-CM | POA: Diagnosis not present

## 2016-05-18 DIAGNOSIS — R251 Tremor, unspecified: Secondary | ICD-10-CM | POA: Diagnosis not present

## 2016-05-18 DIAGNOSIS — Z5181 Encounter for therapeutic drug level monitoring: Secondary | ICD-10-CM

## 2016-05-18 DIAGNOSIS — M47816 Spondylosis without myelopathy or radiculopathy, lumbar region: Secondary | ICD-10-CM

## 2016-05-18 DIAGNOSIS — M4806 Spinal stenosis, lumbar region: Secondary | ICD-10-CM

## 2016-05-18 LAB — FOLATE: Folate: 20.3 ng/mL (ref >5.4)

## 2016-05-18 LAB — VITAMIN B12: Vitamin B-12: 281 pg/mL (ref 200–1100)

## 2016-05-18 NOTE — Progress Notes (Signed)
Subjective:   Robert Mccall was seen in consultation in the movement disorder clinic at the request of Scarlette Calico, MD.  The evaluation is for tremor.  The patient is a 65 y.o. right handed male with a history of tremor.   The patient notes tremor for the last few years; he notes it primarily when he uses it and notes it when he puts toothpaste on the toothbrush.  No new meds at the time.  Reports that he has been on methylphenidate for years and cymbalta started about 1 year ago.   There is no family hx of tremor.    Affected by caffeine:  No. (couple soda/pepsi free per day) Affected by alcohol:  Yes.   (drinks 1-2 glasses wine/day - helps tremor) Affected by stress:  No. Affected by fatigue:  No. Spills soup if on spoon:  No. Spills glass of liquid if full:  No. Affects ADL's (tying shoes, brushing teeth, etc):  Yes.   (notes it buttoning shirt/brushing teeth)  Current/Previously tried tremor medications: n/a  Current medications that may exacerbate tremor:  Methylphenidate, cymbalta  Pt also c/o memory change since last fall.  States that his coworkers have noted little mistakes on important reports at work.  He was having trouble typing that may have contributed but that got better.  He does do the finances in his house without difficulty.  He states that all bills are drafted but they always have been.  He is able to drive and get from place to place without getting lost.  No MVA's in the recent years.  Distributes medications to himself and remembers to take properly.  Outside reports reviewed: historical medical records, lab reports and referral letter/letters.  No Known Allergies  Outpatient Encounter Prescriptions as of 05/18/2016  Medication Sig  . alfuzosin (UROXATRAL) 10 MG 24 hr tablet Take 1 tablet (10 mg total) by mouth daily with breakfast.  . aspirin 81 MG tablet Take 81 mg by mouth daily.    . bimatoprost (LUMIGAN) 0.03 % ophthalmic solution INT 1 GTT INTO OD HS  .  Cholecalciferol (VITAMIN D) 2000 UNITS CAPS Take 1 capsule (2,000 Units total) by mouth 1 dose over 46 hours.  . DULoxetine (CYMBALTA) 30 MG capsule TAKE 1 CAPSULE DAILY.  Marland Kitchen esomeprazole (NEXIUM) 40 MG capsule Take 1 capsule (40 mg total) by mouth daily.  . finasteride (PROSCAR) 5 MG tablet Take 1 tablet (5 mg total) by mouth daily.  Marland Kitchen gabapentin (NEURONTIN) 300 MG capsule Take 1 capsule (300 mg total) by mouth 3 (three) times daily.  . hydrochlorothiazide (MICROZIDE) 12.5 MG capsule Take 1 capsule (12.5 mg total) by mouth daily.  Marland Kitchen KRILL OIL 1000 MG CAPS Take 1 capsule by mouth daily.    Marland Kitchen lamoTRIgine (LAMICTAL) 25 MG tablet Take 3 tablets (75 mg total) by mouth 2 (two) times daily.  . meclizine (ANTIVERT) 12.5 MG tablet Take 1 tablet (12.5 mg total) by mouth 3 (three) times daily as needed for dizziness.  . Methylphenidate HCl ER, XR, 40 MG CP24 Take 40 mg by mouth every morning.  . niacin 500 MG tablet Take 500 mg by mouth daily with breakfast.    . simvastatin (ZOCOR) 40 MG tablet Take 1 tablet (40 mg total) by mouth every evening.  . verapamil (CALAN-SR) 240 MG CR tablet Take 1 tablet (240 mg total) by mouth daily.  . [DISCONTINUED] Travoprost, BAK Free, (TRAVATAN) 0.004 % SOLN ophthalmic solution Place 1 drop into the right eye at  bedtime.   No facility-administered encounter medications on file as of 05/18/2016.    Past Medical History  Diagnosis Date  . Hyperlipidemia   . ADHD (attention deficit hyperactivity disorder)   . Benign prostatic hypertrophy   . Hypertension   . Depression   . Cyst of right kidney     incidental Bosiak 1.3cm on R (MRI abd 09/2014), follows with uro    Past Surgical History  Procedure Laterality Date  . Hydrocele excision Right 1990  . Orchiectomy Left 1970    Social History   Social History  . Marital Status: Single    Spouse Name: N/A  . Number of Children: N/A  . Years of Education: N/A   Occupational History  . Midwife   Social History Main Topics  . Smoking status: Never Smoker   . Smokeless tobacco: Not on file  . Alcohol Use: Yes     Comment: 1-2 glasses wine per day  . Drug Use: No  . Sexual Activity: Yes   Other Topics Concern  . Not on file   Social History Narrative    Family Status  Relation Status Death Age  . Mother Deceased 68    COPD, heart disease, breast ca  . Father Alive     DM, blind  . Brother Alive     stroke  . Sister Alive     healthy  . Sister Alive     bipolar, lupus  . Son Alive     healthy  . Daughter Alive     healthy    Review of Systems Chronic tinnitus and has buzzing in the right ear.  On gabapentin for LBP.  A complete 10 system ROS was obtained and was negative apart from what is mentioned.   Objective:   VITALS:   Filed Vitals:   05/18/16 0907  BP: 158/100  Pulse: 102  Height: 5' 4.5" (1.638 m)  Weight: 170 lb (77.111 kg)   Gen:  Appears stated age and in NAD.  He is anxious HEENT:  Normocephalic, atraumatic. The mucous membranes are moist. The superficial temporal arteries are without ropiness or tenderness. Cardiovascular: Regular rate and rhythm. Lungs: Clear to auscultation bilaterally. Neck: There are no carotid bruits noted bilaterally.  NEUROLOGICAL:  Orientation:   Montreal Cognitive Assessment  05/18/2016  Visuospatial/ Executive (0/5) 5  Naming (0/3) 2  Attention: Read list of digits (0/2) 2  Attention: Read list of letters (0/1) 1  Attention: Serial 7 subtraction starting at 100 (0/3) 3  Language: Repeat phrase (0/2) 2  Language : Fluency (0/1) 1  Abstraction (0/2) 2  Delayed Recall (0/5) 4  Orientation (0/6) 6  Total 28  Adjusted Score (based on education) 28   Cranial nerves: There is good facial symmetry. The pupils are equal round and reactive to light bilaterally. Fundoscopic exam reveals clear disc margins bilaterally. Extraocular muscles are intact and visual fields are full to  confrontational testing. Speech is fluent and clear. Soft palate rises symmetrically and there is no tongue deviation. Hearing is intact to conversational tone. Tone: Tone is good throughout. Sensation: Sensation is intact to light touch and pinprick throughout (facial, trunk, extremities). Vibration is intact at the bilateral big toe. There is no extinction with double simultaneous stimulation. There is no sensory dermatomal level identified. Coordination:  The patient has no dysdiadichokinesia or dysmetria. Motor: Strength is 5/5 in the bilateral upper and lower extremities.  Shoulder shrug is equal  bilaterally.  There is no pronator drift.  There are no fasciculations noted (pt in exam gown/shorts. DTR's: Deep tendon reflexes are 2/4 at the bilateral biceps, triceps, brachioradialis, 3/4 at the bilateral patella and achilles.  He has non sustained ankle clonus.  Plantar responses are downgoing bilaterally. Gait and Station: The patient is able to ambulate without difficulty. He has trouble walking in a tandem fashion.  He is able to heel-toe walk and able to stand in the romberg position.    MOVEMENT EXAM: Tremor:  There is tremor in the UE, noted mildly at rest and with action.  The patient is  able to draw Archimedes spirals without significant difficulty.  There is mild tremor at rest intermittenly on the right.  The patient is able to pour water from one glass to another without spilling it but he has evidence of tremor in the right hand.  LABS  Lab Results  Component Value Date   TSH 1.27 02/03/2016   No results found for: VITAMINB12  No results found for: FOLATE    Chemistry      Component Value Date/Time   NA 139 02/03/2016 0911   NA 139 08/02/2015   K 3.6 02/03/2016 0911   CL 101 02/03/2016 0911   CO2 31 02/03/2016 0911   BUN 18 02/03/2016 0911   BUN 13 08/02/2015   CREATININE 1.09 02/03/2016 0911   CREATININE 1.1 08/02/2015   GLU 102 08/02/2015      Component Value  Date/Time   CALCIUM 9.2 02/03/2016 0911   CALCIUM 9.0 09/24/2009 0000   ALKPHOS 57 02/03/2016 0911   AST 29 02/03/2016 0911   ALT 31 02/03/2016 0911   BILITOT 0.6 02/03/2016 0911        Lab Results  Component Value Date   HGBA1C 5.9 02/03/2016      Assessment/Plan:   1.  Tremor.  -This is likely at least exacerbated by medication, especially methylphenidate.  Cymbalta may also contribute to tremor.  However, he does report that tremor started before cymbalta and does have a mild degree of rest tremor on the L with distraction.  He does not want to do anything and wants no medication for this.  I see no evidence of a neurodegenerative disorder but because he has some degree of rest tremor I want to see him every 6-8 months.  He is very anxious and almost has [place].    2.  Hyperreflexia   -reviewed MRI lumbar spine from 2014 with severe degen changes esp at L4-5 and L5-S1.    -He is clinically doing better with the gabapentin but we decided to re-image to make sure no further deterioration in the last few years.  3.  Peripheral neuropathy  -we will do labs to r/o reversible causes.  Talked to him about decreasing EtOH intake.    4.  Memory Loss  -I saw no evidence of a neurodegenerative memory loss.  If it becomes more of an issue, we can schedule him at our office for neuropsych testing.    5.  F/u 6-8 months, sooner should new neuro issues arise.

## 2016-05-18 NOTE — Patient Instructions (Signed)
1. Your provider has requested that you have labwork completed today. Please go to [facility] ([id number]) on the second floor of this building before leaving the office today. You do not need to check in. If you are not called within 15 minutes please check with the front desk.   2. We have sent a referral to [facility] for your MRI and they will call you directly to schedule your appt. They are located at [address] If you need to contact them directly please call [phone].    

## 2016-05-19 LAB — RPR

## 2016-05-20 ENCOUNTER — Telehealth: Payer: Self-pay | Admitting: *Deleted

## 2016-05-20 LAB — PROTEIN ELECTROPHORESIS,RANDOM URN
ALPHA-2-GLOBULIN, U: 12.4 %
Albumin: 48.2 %
Alpha-1-Globulin, U: 18.3 %
BETA GLOBULIN, U: 14.2 %
CREATININE, URINE: 434 mg/dL — AB (ref 20–370)
GAMMA GLOBULIN, U: 6.9 %
PROTEIN CREATININE RATIO: 184 mg/g{creat} — AB (ref 22–128)
Total Protein, Urine: 80 mg/dL — ABNORMAL HIGH (ref 5–25)

## 2016-05-20 LAB — PROTEIN ELECTROPHORESIS, SERUM
ABNORMAL PROTEIN BAND1: 0.4 g/dL
Albumin ELP: 4.4 g/dL (ref 3.8–4.8)
Alpha-1-Globulin: 0.3 g/dL (ref 0.2–0.3)
Alpha-2-Globulin: 0.8 g/dL (ref 0.5–0.9)
Beta 2: 0.3 g/dL (ref 0.2–0.5)
Beta Globulin: 0.5 g/dL (ref 0.4–0.6)
GAMMA GLOBULIN: 1.1 g/dL (ref 0.8–1.7)
TOTAL PROTEIN, SERUM ELECTROPHOR: 7.3 g/dL (ref 6.1–8.1)

## 2016-05-20 LAB — IMMUNOFIXATION INTE

## 2016-05-20 LAB — COPPER, SERUM: Copper: 90 ug/dL (ref 72–166)

## 2016-05-20 LAB — IMMUNOFIXATION ELECTROPHORESIS
IGM, SERUM: 150 mg/dL (ref 48–271)
IgA: 154 mg/dL (ref 81–463)
IgG (Immunoglobin G), Serum: 1084 mg/dL (ref 694–1618)

## 2016-05-20 LAB — CERULOPLASMIN: Ceruloplasmin: 24 mg/dL (ref 18–36)

## 2016-05-20 NOTE — Telephone Encounter (Signed)
Noted in work queue.

## 2016-05-20 NOTE — Telephone Encounter (Signed)
Patient's MRI has been denied.  Needs a peer to peer.  [phone] option 2.  Ask for Tammy or another nurse if she is not available.

## 2016-05-20 NOTE — Telephone Encounter (Signed)
Called and got approval for 90 days.  SOSBD is reference ID

## 2016-05-21 ENCOUNTER — Telehealth: Payer: Self-pay | Admitting: Neurology

## 2016-05-21 ENCOUNTER — Other Ambulatory Visit: Payer: Self-pay | Admitting: *Deleted

## 2016-05-21 DIAGNOSIS — R809 Proteinuria, unspecified: Secondary | ICD-10-CM

## 2016-05-21 NOTE — Telephone Encounter (Signed)
Patient notified of results and given instructions.  Referral sent to hematology.

## 2016-05-21 NOTE — Telephone Encounter (Signed)
Ashley please call.  

## 2016-05-21 NOTE — Telephone Encounter (Signed)
-----   Message from Rivereno, DO sent at 05/20/2016  5:33 PM EDT ----- Let pt know that B12 little low and should start oral b12 1053mcg daily.  Also there is an abnormal protein spike in urine.  Refer to Dr. [name] (hematology) if patient agreeable.

## 2016-05-25 ENCOUNTER — Other Ambulatory Visit: Payer: PRIVATE HEALTH INSURANCE

## 2016-05-27 ENCOUNTER — Encounter: Payer: Self-pay | Admitting: Hematology and Oncology

## 2016-06-01 ENCOUNTER — Encounter: Payer: Self-pay | Admitting: Internal Medicine

## 2016-06-01 ENCOUNTER — Ambulatory Visit (INDEPENDENT_AMBULATORY_CARE_PROVIDER_SITE_OTHER): Payer: PRIVATE HEALTH INSURANCE | Admitting: Internal Medicine

## 2016-06-01 VITALS — BP 144/86 | HR 80 | Temp 98.0°F | Resp 16 | Ht 64.5 in | Wt 173.0 lb

## 2016-06-01 DIAGNOSIS — I10 Essential (primary) hypertension: Secondary | ICD-10-CM

## 2016-06-01 DIAGNOSIS — F909 Attention-deficit hyperactivity disorder, unspecified type: Secondary | ICD-10-CM | POA: Diagnosis not present

## 2016-06-01 DIAGNOSIS — F988 Other specified behavioral and emotional disorders with onset usually occurring in childhood and adolescence: Secondary | ICD-10-CM

## 2016-06-01 MED ORDER — METHYLPHENIDATE HCL ER (XR) 40 MG PO CP24: 40.0000 mg | ORAL_CAPSULE | ORAL | Status: DC

## 2016-06-01 NOTE — Progress Notes (Signed)
Subjective:  Patient ID: Robert Mccall, male    DOB: 01/13/1951  Age: 65 y.o. MRN: [id number]  CC: Hypertension and ADHD   HPI GURBIR MENDER presents for a blood pressure check and refill of methylphenidate for ADHD. Since I last saw him he has seen a neurologist and was diagnosed with peripheral neuropathy and there is some concern that he might have monoclonal gammopathy of uncertain significance. He has been referred to Heme/Onc for further evaluation. He has persistent tremors but he tells me the neurologist does not think that he has Parkinson's disease.  Outpatient Prescriptions Prior to Visit  Medication Sig Dispense Refill  . alfuzosin (UROXATRAL) 10 MG 24 hr tablet Take 1 tablet (10 mg total) by mouth daily with breakfast. 90 tablet 1  . aspirin 81 MG tablet Take 81 mg by mouth daily.      . bimatoprost (LUMIGAN) 0.03 % ophthalmic solution INT 1 GTT INTO OD HS  0  . Cholecalciferol (VITAMIN D) 2000 UNITS CAPS Take 1 capsule (2,000 Units total) by mouth 1 dose over 46 hours. 30 capsule 11  . DULoxetine (CYMBALTA) 30 MG capsule TAKE 1 CAPSULE DAILY. 30 capsule 5  . esomeprazole (NEXIUM) 40 MG capsule Take 1 capsule (40 mg total) by mouth daily. 90 capsule 1  . finasteride (PROSCAR) 5 MG tablet Take 1 tablet (5 mg total) by mouth daily. 90 tablet 1  . gabapentin (NEURONTIN) 300 MG capsule Take 1 capsule (300 mg total) by mouth 3 (three) times daily. 270 capsule 1  . hydrochlorothiazide (MICROZIDE) 12.5 MG capsule Take 1 capsule (12.5 mg total) by mouth daily. 90 capsule 3  . KRILL OIL 1000 MG CAPS Take 1 capsule by mouth daily.      Marland Kitchen lamoTRIgine (LAMICTAL) 25 MG tablet Take 3 tablets (75 mg total) by mouth 2 (two) times daily. 540 tablet 1  . meclizine (ANTIVERT) 12.5 MG tablet Take 1 tablet (12.5 mg total) by mouth 3 (three) times daily as needed for dizziness. 30 tablet 0  . niacin 500 MG tablet Take 500 mg by mouth daily with breakfast.      . simvastatin (ZOCOR) 40 MG tablet Take  1 tablet (40 mg total) by mouth every evening. 90 tablet 3  . verapamil (CALAN-SR) 240 MG CR tablet Take 1 tablet (240 mg total) by mouth daily. 90 tablet 1  . Methylphenidate HCl ER, XR, 40 MG CP24 Take 40 mg by mouth every morning. 30 capsule 0   No facility-administered medications prior to visit.    ROS Review of Systems  Constitutional: Negative.  Negative for appetite change and fatigue.  HENT: Negative.  Negative for trouble swallowing.   Eyes: Negative.  Negative for visual disturbance.  Respiratory: Negative.  Negative for cough, choking, chest tightness, shortness of breath and stridor.   Cardiovascular: Negative.  Negative for chest pain, palpitations and leg swelling.  Gastrointestinal: Negative.  Negative for nausea, abdominal pain, diarrhea and constipation.  Endocrine: Negative.   Genitourinary: Negative.  Negative for difficulty urinating.  Musculoskeletal: Negative.  Negative for myalgias, back pain, joint swelling, arthralgias and neck pain.  Skin: Negative.  Negative for color change and rash.  Allergic/Immunologic: Negative.   Neurological: Positive for tremors. Negative for dizziness.  Hematological: Negative.  Negative for adenopathy. Does not bruise/bleed easily.  Psychiatric/Behavioral: Positive for decreased concentration. Negative for suicidal ideas, confusion, sleep disturbance, self-injury and agitation. The patient is not nervous/anxious.     Objective:  BP 144/86 mmHg  Pulse  80  Temp(Src) 98 F (36.7 C) (Oral)  Resp 16  Ht 5' 4.5" (1.638 m)  Wt 173 lb (78.472 kg)  BMI 29.25 kg/m2  SpO2 97%  BP Readings from Last 3 Encounters:  06/01/16 144/86  05/18/16 158/100  04/09/16 110/62    Wt Readings from Last 3 Encounters:  06/01/16 173 lb (78.472 kg)  05/18/16 170 lb (77.111 kg)  04/09/16 173 lb (78.472 kg)    Physical Exam  Constitutional: He is oriented to person, place, and time. No distress.  HENT:  Mouth/Throat: Oropharynx is clear and  moist. No oropharyngeal exudate.  Eyes: Conjunctivae are normal. Right eye exhibits no discharge. Left eye exhibits no discharge. No scleral icterus.  Neck: Normal range of motion. Neck supple. No JVD present. No tracheal deviation present. No thyromegaly present.  Cardiovascular: Normal rate, regular rhythm, normal heart sounds and intact distal pulses.  Exam reveals no gallop and no friction rub.   No murmur heard. Pulmonary/Chest: Effort normal and breath sounds normal. No stridor. No respiratory distress. He has no wheezes. He has no rales. He exhibits no tenderness.  Abdominal: Soft. Bowel sounds are normal. He exhibits no distension and no mass. There is no tenderness. There is no rebound and no guarding.  Musculoskeletal: Normal range of motion. He exhibits no edema or tenderness.  Lymphadenopathy:    He has no cervical adenopathy.  Neurological: He is oriented to person, place, and time. He has normal strength. He displays tremor. He displays a negative Romberg sign.  He is tremulous today  Skin: Skin is warm and dry. No rash noted. He is not diaphoretic. No erythema. No pallor.  Vitals reviewed.   Lab Results  Component Value Date   WBC 7.2 02/03/2016   HGB 15.0 02/03/2016   HCT 43.7 02/03/2016   PLT 250.0 02/03/2016   GLUCOSE 131* 02/03/2016   CHOL 133 02/03/2016   TRIG 117.0 02/03/2016   HDL 43.10 02/03/2016   LDLCALC 67 02/03/2016   ALT 31 02/03/2016   AST 29 02/03/2016   NA 139 02/03/2016   K 3.6 02/03/2016   CL 101 02/03/2016   CREATININE 1.09 02/03/2016   BUN 18 02/03/2016   CO2 31 02/03/2016   TSH 1.27 02/03/2016   PSA 0.38 02/03/2016   HGBA1C 5.9 02/03/2016    Mr Abdomen W Wo Contrast  10/01/2014  CLINICAL DATA:  Evaluate renal mass. EXAM: MRI ABDOMEN WITHOUT AND WITH CONTRAST TECHNIQUE: Multiplanar multisequence MR imaging of the abdomen was performed both before and after the administration of intravenous contrast. CONTRAST:  74mL MULTIHANCE GADOBENATE  DIMEGLUMINE 529 MG/ML IV SOLN COMPARISON:  04/02/2014 FINDINGS: Lower chest:  No pleural effusion identified. Hepatobiliary: Hepatic steatosis identified. Cyst in the lateral segment of left lobe of liver measures 9 mm. No suspicious enhancing liver abnormalities. The gallbladder appears normal. No biliary dilatation identified. Pancreas: Normal appearance of the pancreas. No pancreatic duct dilatation. Spleen: The spleen is unremarkable. Adrenals/Urinary Tract: Normal appearance of both adrenal glands. Multiple areas of scarring are identified within the right kidney. Within the inferior pole of the right kidney there is a exophytic lesion measuring 1.3 cm. This exhibits increased T1 signal. No enhancement identified within this lesion. No suspicious left renal abnormalities. Stomach/Bowel: The stomach and upper abdominal bowel loops are unremarkable. Vascular/Lymphatic: Normal caliber of the abdominal aorta. The SMV, portal venous confluence, portal vein and splenic vein are patent. No retroperitoneal adenopathy identified. No mesenteric adenopathy. Other: There is no ascites within the upper abdomen. Musculoskeletal:  Normal signal throughout the bone marrow. IMPRESSION: 1. No acute findings within the upper abdomen. 2. Benign Bosniak category 2 lesion is identified within the right kidney. 3. Hepatic steatosis. Electronically Signed   By: Kerby Moors M.D.   On: 10/01/2014 10:18    Assessment & Plan:   Britan was seen today for hypertension and adhd.  Diagnoses and all orders for this visit:  Attention deficit disorder -     Discontinue: Methylphenidate HCl ER, XR, 40 MG CP24; Take 40 mg by mouth every morning. -     Discontinue: Methylphenidate HCl ER, XR, 40 MG CP24; Take 40 mg by mouth every morning. -     Discontinue: Methylphenidate HCl ER, XR, 40 MG CP24; Take 40 mg by mouth every morning. -     Methylphenidate HCl ER, XR, 40 MG CP24; Take 40 mg by mouth every morning.  Essential  hypertension- his blood pressure is adequately well controlled   I am having Mr. Ashmead maintain his aspirin, Krill Oil, niacin, Vitamin D, DULoxetine, lamoTRIgine, esomeprazole, finasteride, gabapentin, verapamil, alfuzosin, hydrochlorothiazide, simvastatin, meclizine, bimatoprost, B-12, and Methylphenidate HCl ER (XR).  Meds ordered this encounter  Medications  . Cyanocobalamin (B-12) 2000 MCG TABS    Sig: Take 2,000 mcg by mouth.  . DISCONTD: Methylphenidate HCl ER, XR, 40 MG CP24    Sig: Take 40 mg by mouth every morning.    Dispense:  30 capsule    Refill:  0    May fill once every 30 days. June  2017  . DISCONTD: Methylphenidate HCl ER, XR, 40 MG CP24    Sig: Take 40 mg by mouth every morning.    Dispense:  30 capsule    Refill:  0    May fill once every 30 days. July  2017  . DISCONTD: Methylphenidate HCl ER, XR, 40 MG CP24    Sig: Take 40 mg by mouth every morning.    Dispense:  30 capsule    Refill:  0    May fill once every 30 days. August  2017  . Methylphenidate HCl ER, XR, 40 MG CP24    Sig: Take 40 mg by mouth every morning.    Dispense:  30 capsule    Refill:  0    May fill once every 30 days. Sept  2017     Follow-up: Return in about 4 months (around 10/01/2016).  Scarlette Calico, MD

## 2016-06-01 NOTE — Patient Instructions (Signed)
Hypertension Hypertension, commonly called high blood pressure, is when the force of blood pumping through your arteries is too strong. Your arteries are the blood vessels that carry blood from your heart throughout your body. A blood pressure reading consists of a higher number over a lower number, such as 110/72. The higher number (systolic) is the pressure inside your arteries when your heart pumps. The lower number (diastolic) is the pressure inside your arteries when your heart relaxes. Ideally you want your blood pressure below 120/80. Hypertension forces your heart to work harder to pump blood. Your arteries may become narrow or stiff. Having untreated or uncontrolled hypertension can cause heart attack, stroke, kidney disease, and other problems. RISK FACTORS Some risk factors for high blood pressure are controllable. Others are not.  Risk factors you cannot control include:   Race. You may be at higher risk if you are African American.  Age. Risk increases with age.  Gender. Men are at higher risk than women before age 45 years. After age 65, women are at higher risk than men. Risk factors you can control include:  Not getting enough exercise or physical activity.  Being overweight.  Getting too much fat, sugar, calories, or salt in your diet.  Drinking too much alcohol. SIGNS AND SYMPTOMS Hypertension does not usually cause signs or symptoms. Extremely high blood pressure (hypertensive crisis) may cause headache, anxiety, shortness of breath, and nosebleed. DIAGNOSIS To check if you have hypertension, your health care provider will measure your blood pressure while you are seated, with your arm held at the level of your heart. It should be measured at least twice using the same arm. Certain conditions can cause a difference in blood pressure between your right and left arms. A blood pressure reading that is higher than normal on one occasion does not mean that you need treatment. If  it is not clear whether you have high blood pressure, you may be asked to return on a different day to have your blood pressure checked again. Or, you may be asked to monitor your blood pressure at home for 1 or more weeks. TREATMENT Treating high blood pressure includes making lifestyle changes and possibly taking medicine. Living a healthy lifestyle can help lower high blood pressure. You may need to change some of your habits. Lifestyle changes may include:  Following the DASH diet. This diet is high in fruits, vegetables, and whole grains. It is low in salt, red meat, and added sugars.  Keep your sodium intake below 2,300 mg per day.  Getting at least 30-45 minutes of aerobic exercise at least 4 times per week.  Losing weight if necessary.  Not smoking.  Limiting alcoholic beverages.  Learning ways to reduce stress. Your health care provider may prescribe medicine if lifestyle changes are not enough to get your blood pressure under control, and if one of the following is true:  You are 18-59 years of age and your systolic blood pressure is above 140.  You are 60 years of age or older, and your systolic blood pressure is above 150.  Your diastolic blood pressure is above 90.  You have diabetes, and your systolic blood pressure is over 140 or your diastolic blood pressure is over 90.  You have kidney disease and your blood pressure is above 140/90.  You have heart disease and your blood pressure is above 140/90. Your personal target blood pressure may vary depending on your medical conditions, your age, and other factors. HOME CARE INSTRUCTIONS    Have your blood pressure rechecked as directed by your health care provider.   Take medicines only as directed by your health care provider. Follow the directions carefully. Blood pressure medicines must be taken as prescribed. The medicine does not work as well when you skip doses. Skipping doses also puts you at risk for  problems.  Do not smoke.   Monitor your blood pressure at home as directed by your health care provider. SEEK MEDICAL CARE IF:   You think you are having a reaction to medicines taken.  You have recurrent headaches or feel dizzy.  You have swelling in your ankles.  You have trouble with your vision. SEEK IMMEDIATE MEDICAL CARE IF:  You develop a severe headache or confusion.  You have unusual weakness, numbness, or feel faint.  You have severe chest or abdominal pain.  You vomit repeatedly.  You have trouble breathing. MAKE SURE YOU:   Understand these instructions.  Will watch your condition.  Will get help right away if you are not doing well or get worse.   This information is not intended to replace advice given to you by your health care provider. Make sure you discuss any questions you have with your health care provider.   Document Released: 11/23/2005 Document Revised: 04/09/2015 Document Reviewed: 09/15/2013 Elsevier Interactive Patient Education 2016 [employer]  

## 2016-06-01 NOTE — Progress Notes (Signed)
Pre visit review using our clinic review tool, if applicable. No additional management support is needed unless otherwise documented below in the visit note. 

## 2016-06-11 ENCOUNTER — Telehealth: Payer: Self-pay | Admitting: Hematology and Oncology

## 2016-06-11 ENCOUNTER — Encounter: Payer: Self-pay | Admitting: Hematology and Oncology

## 2016-06-11 ENCOUNTER — Ambulatory Visit (HOSPITAL_BASED_OUTPATIENT_CLINIC_OR_DEPARTMENT_OTHER): Payer: PRIVATE HEALTH INSURANCE | Admitting: Hematology and Oncology

## 2016-06-11 VITALS — BP 130/69 | HR 95 | Temp 99.4°F | Resp 18 | Wt 171.2 lb

## 2016-06-11 DIAGNOSIS — G629 Polyneuropathy, unspecified: Secondary | ICD-10-CM | POA: Diagnosis not present

## 2016-06-11 DIAGNOSIS — D472 Monoclonal gammopathy: Secondary | ICD-10-CM | POA: Diagnosis not present

## 2016-06-11 DIAGNOSIS — G63 Polyneuropathy in diseases classified elsewhere: Secondary | ICD-10-CM

## 2016-06-11 NOTE — Assessment & Plan Note (Addendum)
The patient is noted to have IgG lambda MGUS. According to his recent blood work, and there were nothing to suggest end organ damage. Rather than repeating blood tests and perform further workup now, I recommend delaying testing until September. That would give me 2 data sets 3 months apart to assess for changes or disease progression He agreed with the plan of care I spent a lot of time educating the patient the natural history of MGUS

## 2016-06-11 NOTE — Telephone Encounter (Signed)
Gave pt cal & avs °

## 2016-06-11 NOTE — Progress Notes (Signed)
[facility] NOTE  Patient Care Team: [name], MD as PCP - General (Internal Medicine) Inda Castle, MD (Gastroenterology) Camillo Flaming, OD (Optometry) Carolan Clines, MD as Consulting Physician (Urology) Rozetta Nunnery, MD (Otolaryngology)  CHIEF COMPLAINTS/PURPOSE OF CONSULTATION:  IgG lambda MGUS, peripheral neuropathy  HISTORY OF PRESENTING ILLNESS:  Robert Mccall 65 y.o. male is here because of recent findings of IgG lambda MGUS. The patient was feeling unwell recently with sensation of dizziness. He was referred to neurologist for further evaluation. Incidentally, during his physical examination, e also noted history of reduced sensation in his feet and numbness at the end of toes.  The patient has history of chronic back pain and was treated with gabapentin for his back pain. His neurologist perform significant workup including blood work which showed IgG lambda MGUS. He is being referred here for further evaluation He denies history of abnormal bone pain or bone fracture. Patient denies history of recurrent infection or atypical infections such as shingles of meningitis. Denies chills, night sweats, anorexia or abnormal weight loss.  MEDICAL HISTORY:  Past Medical History  Diagnosis Date  . Hyperlipidemia   . ADHD (attention deficit hyperactivity disorder)   . Benign prostatic hypertrophy   . Hypertension   . Depression   . Cyst of right kidney     incidental Bosiak 1.3cm on R (MRI abd 09/2014), follows with uro    SURGICAL HISTORY: Past Surgical History  Procedure Laterality Date  . Hydrocele excision Right 1990  . Orchiectomy Left 1970    SOCIAL HISTORY: Social History   Social History  . Marital Status: Single    Spouse Name: N/A  . Number of Children: N/A  . Years of Education: N/A   Occupational History  . Consulting civil engineer   Social History Main Topics  . Smoking status: Never Smoker    . Smokeless tobacco: Never Used  . Alcohol Use: Yes     Comment: 1-2 glasses wine per day  . Drug Use: No  . Sexual Activity: Yes     Comment: working with replacement   Other Topics Concern  . Not on file   Social History Narrative    FAMILY HISTORY: Family History  Problem Relation Age of Onset  . COPD Mother   . Heart disease Mother   . Macular degeneration Father   . Diabetes Father   . Breast cancer Mother   . Osteoarthritis Mother     s/p B TKR  . Depression Mother     ALLERGIES:  has No Known Allergies.  MEDICATIONS:  Current Outpatient Prescriptions  Medication Sig Dispense Refill  . alfuzosin (UROXATRAL) 10 MG 24 hr tablet Take 1 tablet (10 mg total) by mouth daily with breakfast. 90 tablet 1  . aspirin 81 MG tablet Take 81 mg by mouth daily.      . bimatoprost (LUMIGAN) 0.03 % ophthalmic solution INT 1 GTT INTO OD HS  0  . Cholecalciferol (VITAMIN D) 2000 UNITS CAPS Take 1 capsule (2,000 Units total) by mouth 1 dose over 46 hours. 30 capsule 11  . Cyanocobalamin (B-12) 2000 MCG TABS Take 2,000 mcg by mouth.    . DULoxetine (CYMBALTA) 30 MG capsule TAKE 1 CAPSULE DAILY. 30 capsule 5  . esomeprazole (NEXIUM) 40 MG capsule Take 1 capsule (40 mg total) by mouth daily. 90 capsule 1  . finasteride (PROSCAR) 5 MG tablet Take 1 tablet (5 mg total) by mouth  daily. 90 tablet 1  . gabapentin (NEURONTIN) 300 MG capsule Take 1 capsule (300 mg total) by mouth 3 (three) times daily. 270 capsule 1  . hydrochlorothiazide (MICROZIDE) 12.5 MG capsule Take 1 capsule (12.5 mg total) by mouth daily. 90 capsule 3  . KRILL OIL 1000 MG CAPS Take 1 capsule by mouth daily.      Marland Kitchen lamoTRIgine (LAMICTAL) 25 MG tablet Take 3 tablets (75 mg total) by mouth 2 (two) times daily. 540 tablet 1  . meclizine (ANTIVERT) 12.5 MG tablet Take 1 tablet (12.5 mg total) by mouth 3 (three) times daily as needed for dizziness. 30 tablet 0  . Methylphenidate HCl ER, XR, 40 MG CP24 Take 40 mg by mouth every  morning. 30 capsule 0  . niacin 500 MG tablet Take 500 mg by mouth daily with breakfast.      . simvastatin (ZOCOR) 40 MG tablet Take 1 tablet (40 mg total) by mouth every evening. 90 tablet 3  . verapamil (CALAN-SR) 240 MG CR tablet Take 1 tablet (240 mg total) by mouth daily. 90 tablet 1   No current facility-administered medications for this visit.    REVIEW OF SYSTEMS:   Eyes: Denies blurriness of vision, double vision or watery eyes Ears, nose, mouth, throat, and face: Denies mucositis or sore throat Respiratory: Denies cough, dyspnea or wheezes Cardiovascular: Denies palpitation, chest discomfort or lower extremity swelling Gastrointestinal:  Denies nausea, heartburn or change in bowel habits Skin: Denies abnormal skin rashes Lymphatics: Denies new lymphadenopathy or easy bruising Behavioral/Psych: Mood is stable, no new changes  All other systems were reviewed with the patient and are negative.  PHYSICAL EXAMINATION: ECOG PERFORMANCE STATUS: 0 - Asymptomatic  Filed Vitals:   06/11/16 1352  BP: 130/69  Pulse: 95  Temp: 99.4 F (37.4 C)  Resp: 18   Filed Weights   06/11/16 1352  Weight: 171 lb 4 oz (77.678 kg)    GENERAL:alert, no distress and comfortable SKIN: skin color, texture, turgor are normal, no rashes or significant lesions EYES: normal, conjunctiva are pink and non-injected, sclera clear OROPHARYNX:no exudate, no erythema and lips, buccal mucosa, and tongue normal  NECK: supple, thyroid normal size, non-tender, without nodularity LYMPH:  no palpable lymphadenopathy in the cervical, axillary or inguinal LUNGS: clear to auscultation and percussion with normal breathing effort HEART: regular rate & rhythm and no murmurs and no lower extremity edema ABDOMEN:abdomen soft, non-tender and normal bowel sounds Musculoskeletal:no cyanosis of digits and no clubbing  PSYCH: alert & oriented x 3 with fluent speech NEURO: no focal motor/sensory deficits  LABORATORY  DATA:  I have reviewed the data as listed Lab Results  Component Value Date   WBC 7.2 02/03/2016   HGB 15.0 02/03/2016   HCT 43.7 02/03/2016   MCV 87.5 02/03/2016   PLT 250.0 02/03/2016    ASSESSMENT & PLAN:  MGUS (monoclonal gammopathy of unknown significance) The patient is noted to have IgG lambda MGUS. According to his recent blood work, and there were nothing to suggest end organ damage. Rather than repeating blood tests and perform further workup now, I recommend delaying testing until September. 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  It is unclear whether this is  directly related to MGUS are not I noted that his serum vitamin B-12 was borderline and he is started appropriately on vitamin B-12 supplement. For now, he is relatively asymptomatic. He is already on gabapentin for this. Continue same treatment for now     Orders Placed This Encounter  Procedures  . DG Bone Survey Met    Standing Status: Future     Number of Occurrences:      Standing Expiration Date: 08/11/2017    Order Specific Question:  Reason for Exam (SYMPTOM  OR DIAGNOSIS REQUIRED)    Answer:  staging myeloma    Order Specific Question:  Preferred imaging location?    Answer:  [facility]  . CBC with Differential/Platelet    Standing Status: Future     Number of Occurrences:      Standing Expiration Date: 08/11/2017  . Comprehensive metabolic panel    Standing Status: Future     Number of Occurrences:      Standing Expiration Date: 08/11/2017  . Kappa/lambda light chains    Standing Status: Future     Number of Occurrences:      Standing Expiration Date: 08/11/2017  . Multiple Myeloma Panel  (SPEP&IFE w/QIG)    Standing Status: Future     Number of Occurrences:      Standing Expiration Date: 08/11/2017    All questions were answered. The patient knows to call the clinic with any problems, questions or concerns. I spent 40 minutes counseling the patient face to face. The total time spent in the appointment was 30 minutes and more than 50% was on counseling.     [facility], [place], MD 06/11/2016 3:36 PM

## 2016-06-11 NOTE — Assessment & Plan Note (Signed)
The patient has peripheral neuropathy of unknown reason.  It is unclear whether this is directly related to MGUS are not I noted that his serum vitamin B-12 was borderline and he is started appropriately on vitamin B-12 supplement. For now, he is relatively asymptomatic. He is already on gabapentin for this. Continue same treatment for now

## 2016-08-18 ENCOUNTER — Telehealth: Payer: Self-pay | Admitting: Hematology and Oncology

## 2016-08-18 ENCOUNTER — Ambulatory Visit (HOSPITAL_COMMUNITY)
Admission: RE | Admit: 2016-08-18 | Discharge: 2016-08-18 | Disposition: A | Discharge status: Home / Self Care | Payer: PRIVATE HEALTH INSURANCE | Source: Ambulatory Visit | Attending: Hematology and Oncology | Admitting: Hematology and Oncology

## 2016-08-18 ENCOUNTER — Ambulatory Visit (HOSPITAL_BASED_OUTPATIENT_CLINIC_OR_DEPARTMENT_OTHER): Payer: PRIVATE HEALTH INSURANCE

## 2016-08-18 DIAGNOSIS — M47896 Other spondylosis, lumbar region: Secondary | ICD-10-CM | POA: Diagnosis not present

## 2016-08-18 DIAGNOSIS — D472 Monoclonal gammopathy: Secondary | ICD-10-CM | POA: Insufficient documentation

## 2016-08-18 DIAGNOSIS — M47892 Other spondylosis, cervical region: Secondary | ICD-10-CM | POA: Diagnosis not present

## 2016-08-18 DIAGNOSIS — M47894 Other spondylosis, thoracic region: Secondary | ICD-10-CM | POA: Insufficient documentation

## 2016-08-18 LAB — CBC WITH DIFFERENTIAL/PLATELET
BASO%: 0.4 % (ref 0.0–2.0)
BASOS ABS: 0 10*3/uL (ref 0.0–0.1)
EOS ABS: 0.1 10*3/uL (ref 0.0–0.5)
EOS%: 1.8 % (ref 0.0–7.0)
HCT: 44.1 % (ref 38.4–49.9)
HGB: 14.9 g/dL (ref 13.0–17.1)
LYMPH%: 27.7 % (ref 14.0–49.0)
MCH: 30.1 pg (ref 27.2–33.4)
MCHC: 33.8 g/dL (ref 32.0–36.0)
MCV: 89.2 fL (ref 79.3–98.0)
MONO#: 0.7 10*3/uL (ref 0.1–0.9)
MONO%: 9.5 % (ref 0.0–14.0)
NEUT#: 4.1 10*3/uL (ref 1.5–6.5)
NEUT%: 60.6 % (ref 39.0–75.0)
Platelets: 251 10*3/uL (ref 140–400)
RBC: 4.95 10*6/uL (ref 4.20–5.82)
RDW: 13.3 % (ref 11.0–14.6)
WBC: 6.8 10*3/uL (ref 4.0–10.3)
lymph#: 1.9 10*3/uL (ref 0.9–3.3)

## 2016-08-18 LAB — COMPREHENSIVE METABOLIC PANEL
ALK PHOS: 59 U/L (ref 40–150)
ALT: 24 U/L (ref 0–55)
AST: 21 U/L (ref 5–34)
Albumin: 3.9 g/dL (ref 3.5–5.0)
Anion Gap: 10 mEq/L (ref 3–11)
BUN: 17.2 mg/dL (ref 7.0–26.0)
CHLORIDE: 103 meq/L (ref 98–109)
CO2: 26 meq/L (ref 22–29)
Calcium: 9.2 mg/dL (ref 8.4–10.4)
Creatinine: 1.1 mg/dL (ref 0.7–1.3)
EGFR: 67 mL/min/{1.73_m2} — AB (ref >90)
GLUCOSE: 116 mg/dL (ref 70–140)
POTASSIUM: 3.5 meq/L (ref 3.5–5.1)
SODIUM: 140 meq/L (ref 136–145)
Total Bilirubin: 0.95 mg/dL (ref 0.20–1.20)
Total Protein: 7.6 g/dL (ref 6.4–8.3)

## 2016-08-18 NOTE — Telephone Encounter (Signed)
Email sent to [facility] schedulers per Bone Survey not being scheduled as yet. 08/18/16

## 2016-08-19 ENCOUNTER — Other Ambulatory Visit: Payer: PRIVATE HEALTH INSURANCE

## 2016-08-19 LAB — KAPPA/LAMBDA LIGHT CHAINS
Ig Kappa Free Light Chain: 13.4 mg/L (ref 3.3–19.4)
Ig Lambda Free Light Chain: 14.6 mg/L (ref 5.7–26.3)
KAPPA/LAMBDA FLC RATIO: 0.92 (ref 0.26–1.65)

## 2016-08-20 ENCOUNTER — Other Ambulatory Visit: Payer: Self-pay | Admitting: Internal Medicine

## 2016-08-20 LAB — MULTIPLE MYELOMA PANEL, SERUM
ALBUMIN/GLOB SERPL: 1.4 (ref 0.7–1.7)
ALPHA 1: 0.3 g/dL (ref 0.0–0.4)
ALPHA2 GLOB SERPL ELPH-MCNC: 0.8 g/dL (ref 0.4–1.0)
Albumin SerPl Elph-Mcnc: 3.9 g/dL (ref 2.9–4.4)
B-GLOBULIN SERPL ELPH-MCNC: 1 g/dL (ref 0.7–1.3)
Gamma Glob SerPl Elph-Mcnc: 0.8 g/dL (ref 0.4–1.8)
Globulin, Total: 2.9 g/dL (ref 2.2–3.9)
IGG (IMMUNOGLOBIN G), SERUM: 900 mg/dL (ref 700–1600)
IGM (IMMUNOGLOBIN M), SRM: 123 mg/dL (ref 20–172)
IgA, Qn, Serum: 137 mg/dL (ref 61–437)
M PROTEIN SERPL ELPH-MCNC: 0.4 g/dL — AB
Total Protein: 6.8 g/dL (ref 6.0–8.5)

## 2016-08-24 ENCOUNTER — Other Ambulatory Visit: Payer: Self-pay | Admitting: Family Medicine

## 2016-08-25 LAB — LIPID PANEL W/O CHOL/HDL RATIO
Cholesterol, Total: 126 mg/dL (ref 100–199)
HDL: 41 mg/dL (ref >39)
LDL Calculated: 50 mg/dL (ref 0–99)
TRIGLYCERIDES: 173 mg/dL — AB (ref 0–149)
VLDL Cholesterol Cal: 35 mg/dL (ref 5–40)

## 2016-08-25 LAB — HGB A1C W/O EAG: HEMOGLOBIN A1C: 5.5 % (ref 4.8–5.6)

## 2016-08-25 LAB — PSA: PROSTATE SPECIFIC AG, SERUM: 0.5 ng/mL (ref 0.0–4.0)

## 2016-08-26 ENCOUNTER — Ambulatory Visit (HOSPITAL_BASED_OUTPATIENT_CLINIC_OR_DEPARTMENT_OTHER): Payer: PRIVATE HEALTH INSURANCE | Admitting: Hematology and Oncology

## 2016-08-26 ENCOUNTER — Telehealth: Payer: Self-pay | Admitting: Hematology and Oncology

## 2016-08-26 ENCOUNTER — Encounter: Payer: Self-pay | Admitting: Neurology

## 2016-08-26 ENCOUNTER — Encounter: Payer: Self-pay | Admitting: Hematology and Oncology

## 2016-08-26 DIAGNOSIS — M545 Low back pain: Secondary | ICD-10-CM | POA: Diagnosis not present

## 2016-08-26 DIAGNOSIS — D472 Monoclonal gammopathy: Secondary | ICD-10-CM

## 2016-08-26 DIAGNOSIS — G8929 Other chronic pain: Secondary | ICD-10-CM | POA: Diagnosis not present

## 2016-08-26 DIAGNOSIS — G63 Polyneuropathy in diseases classified elsewhere: Secondary | ICD-10-CM

## 2016-08-26 DIAGNOSIS — G622 Polyneuropathy due to other toxic agents: Secondary | ICD-10-CM | POA: Diagnosis not present

## 2016-08-26 NOTE — Telephone Encounter (Signed)
Gave patient avs report and appointments for September 2018.  °

## 2016-08-26 NOTE — Progress Notes (Signed)
[facility] OFFICE PROGRESS NOTE  Patient Care Team: Thomas L Jones, MD as PCP - General (Internal Medicine) Robert D Kaplan, MD (Gastroenterology) Timothy Koop, OD (Optometry) Sigmund Tannenbaum, MD as Consulting Physician (Urology) Christopher E Newman, MD (Otolaryngology)  SUMMARY OF ONCOLOGIC HISTORY:  IgG lambda MGUS, peripheral neuropathy  HISTORY OF PRESENTING ILLNESS:  Robert Mccall 65 y.o. male is here because of recent findings of IgG lambda MGUS. The patient was feeling unwell recently with sensation of dizziness. He was referred to neurologist for further evaluation. Incidentally, during his physical examination, he also noted history of reduced sensation in his feet and numbness at the end of toes.  The patient has history of chronic back pain and was treated with gabapentin for his back pain. His neurologist perform significant workup including blood work which showed IgG lambda MGUS. He is being referred here for further evaluation He denies history of abnormal bone pain or bone fracture. Patient denies history of recurrent infection or atypical infections such as shingles of meningitis. Denies chills, night sweats, anorexia or abnormal weight loss. Repeat blood work and skeletal survey in September 2017 show no evidence of end organ damage  INTERVAL HISTORY: Please see below for problem oriented charting. He feels well. He has persistent peripheral neuropathy, unchanged. He has chronic back pain, unchanged. He had received recent influenza vaccination  REVIEW OF SYSTEMS:   Constitutional: Denies fevers, chills or abnormal weight loss Eyes: Denies blurriness of vision Ears, nose, mouth, throat, and face: Denies mucositis or sore throat Respiratory: Denies cough, dyspnea or wheezes Cardiovascular: Denies palpitation, chest discomfort or lower extremity swelling Gastrointestinal:  Denies nausea, heartburn or change in bowel habits Skin: Denies  abnormal skin rashes Lymphatics: Denies new lymphadenopathy or easy bruising Neurological:Denies numbness, tingling or new weaknesses Behavioral/Psych: Mood is stable, no new changes  All other systems were reviewed with the patient and are negative.  I have reviewed the past medical history, past surgical history, social history and family history with the patient and they are unchanged from previous note.  ALLERGIES:  has No Known Allergies.  MEDICATIONS:  Current Outpatient Prescriptions  Medication Sig Dispense Refill  . alfuzosin (UROXATRAL) 10 MG 24 hr tablet Take 1 tablet (10 mg total) by mouth daily with breakfast. 90 tablet 1  . aspirin 81 MG tablet Take 81 mg by mouth daily.      . bimatoprost (LUMIGAN) 0.03 % ophthalmic solution INT 1 GTT INTO OD HS  0  . Cholecalciferol (VITAMIN D) 2000 UNITS CAPS Take 1 capsule (2,000 Units total) by mouth 1 dose over 46 hours. 30 capsule 11  . Cyanocobalamin (B-12) 2000 MCG TABS Take 2,000 mcg by mouth.    . DULoxetine (CYMBALTA) 30 MG capsule TAKE 1 CAPSULE DAILY. 30 capsule 5  . esomeprazole (NEXIUM) 40 MG capsule TAKE 1 CAPSULE(40 MG) BY MOUTH DAILY 90 capsule 2  . finasteride (PROSCAR) 5 MG tablet Take 1 tablet (5 mg total) by mouth daily. 90 tablet 1  . gabapentin (NEURONTIN) 300 MG capsule Take 1 capsule (300 mg total) by mouth 3 (three) times daily. 270 capsule 1  . hydrochlorothiazide (MICROZIDE) 12.5 MG capsule Take 1 capsule (12.5 mg total) by mouth daily. 90 capsule 3  . KRILL OIL 1000 MG CAPS Take 1 capsule by mouth daily.      . lamoTRIgine (LAMICTAL) 25 MG tablet Take 3 tablets (75 mg total) by mouth 2 (two) times daily. 540 tablet 1  . meclizine (ANTIVERT) 12.5 MG   tablet Take 1 tablet (12.5 mg total) by mouth 3 (three) times daily as needed for dizziness. 30 tablet 0  . Methylphenidate HCl ER, XR, 40 MG CP24 Take 40 mg by mouth every morning. 30 capsule 0  . niacin 500 MG tablet Take 500 mg by mouth daily with breakfast.       . simvastatin (ZOCOR) 40 MG tablet Take 1 tablet (40 mg total) by mouth every evening. 90 tablet 3  . verapamil (CALAN-SR) 240 MG CR tablet Take 1 tablet (240 mg total) by mouth daily. 90 tablet 1   No current facility-administered medications for this visit.     PHYSICAL EXAMINATION: ECOG PERFORMANCE STATUS: 0 - Asymptomatic  Vitals:   08/26/16 0828  BP: (!) 147/91  Pulse: (!) 103  Resp: 18  Temp: 98.1 F (36.7 C)   Filed Weights   08/26/16 0828  Weight: 173 lb 11.2 oz (78.8 kg)    GENERAL:alert, no distress and comfortable SKIN: skin color, texture, turgor are normal, no rashes or significant lesions EYES: normal, Conjunctiva are pink and non-injected, sclera clear Musculoskeletal:no cyanosis of digits and no clubbing  NEURO: alert & oriented x 3 with fluent speech, no focal motor/sensory deficits  LABORATORY DATA:  I have reviewed the data as listed    Component Value Date/Time   NA 140 08/18/2016 0953   K 3.5 08/18/2016 0953   CL 101 02/03/2016 0911   CO2 26 08/18/2016 0953   GLUCOSE 116 08/18/2016 0953   BUN 17.2 08/18/2016 0953   CREATININE 1.1 08/18/2016 0953   CALCIUM 9.2 08/18/2016 0953   PROT 7.6 08/18/2016 0953   PROT 6.8 08/18/2016 0953   ALBUMIN 3.9 08/18/2016 0953   AST 21 08/18/2016 0953   ALT 24 08/18/2016 0953   ALKPHOS 59 08/18/2016 0953   BILITOT 0.95 08/18/2016 0953   GFRNONAA 82 09/16/2007 0945   GFRAA 99 09/16/2007 0945    No results found for: SPEP, UPEP  Lab Results  Component Value Date   WBC 6.8 08/18/2016   NEUTROABS 4.1 08/18/2016   HGB 14.9 08/18/2016   HCT 44.1 08/18/2016   MCV 89.2 08/18/2016   PLT 251 08/18/2016      Chemistry      Component Value Date/Time   NA 140 08/18/2016 0953   K 3.5 08/18/2016 0953   CL 101 02/03/2016 0911   CO2 26 08/18/2016 0953   BUN 17.2 08/18/2016 0953   CREATININE 1.1 08/18/2016 0953   GLU 102 08/02/2015      Component Value Date/Time   CALCIUM 9.2 08/18/2016 0953   ALKPHOS 59  08/18/2016 0953   AST 21 08/18/2016 0953   ALT 24 08/18/2016 0953   BILITOT 0.95 08/18/2016 0953      ASSESSMENT & PLAN:  MGUS (monoclonal gammopathy of unknown significance) The patient is noted to have IgG lambda MGUS. According to his recent blood work, and there were nothing to suggest end organ damage. I spent a lot of time educating the patient the natural history of MGUS I will see him once a year with repeat labs and examination  Neuropathy associated with monoclonal gammopathy of undetermined significance (HCC) The patient has peripheral neuropathy of unknown reason.  It is unlikely related to MGUS I noted that his serum vitamin B-12 was borderline and he is started appropriately on vitamin B-12 supplement. For now, he is relatively asymptomatic. He is already on gabapentin for this. Continue same treatment for now   Orders Placed This Encounter    Procedures  . CBC with Differential/Platelet    Standing Status:   Future    Standing Expiration Date:   09/30/2017  . Comprehensive metabolic panel    Standing Status:   Future    Standing Expiration Date:   09/30/2017  . Kappa/lambda light chains    Standing Status:   Future    Standing Expiration Date:   09/30/2017  . Multiple Myeloma Panel (SPEP&IFE w/QIG)    Standing Status:   Future    Standing Expiration Date:   09/30/2017   All questions were answered. The patient knows to call the clinic with any problems, questions or concerns. No barriers to learning was detected. I spent 15 minutes counseling the patient face to face. The total time spent in the appointment was 20 minutes and more than 50% was on counseling and review of test results     GORSUCH, NI, MD 08/26/2016 9:01 AM   

## 2016-08-26 NOTE — Assessment & Plan Note (Signed)
The patient has peripheral neuropathy of unknown reason.  It is unlikely related to MGUS I noted that his serum vitamin B-12 was borderline and he is started appropriately on vitamin B-12 supplement. For now, he is relatively asymptomatic. He is already on gabapentin for this. Continue same treatment for now 

## 2016-08-26 NOTE — Assessment & Plan Note (Signed)
The patient is noted to have IgG lambda MGUS. According to his recent blood work, and there were nothing to suggest end organ damage. I spent a lot of time educating the patient the natural history of MGUS I will see him once a year with repeat labs and examination

## 2016-08-27 ENCOUNTER — Encounter: Payer: Self-pay | Admitting: Internal Medicine

## 2016-09-03 ENCOUNTER — Ambulatory Visit
Admission: RE | Admit: 2016-09-03 | Discharge: 2016-09-03 | Disposition: A | Discharge status: Home / Self Care | Payer: PRIVATE HEALTH INSURANCE | Source: Ambulatory Visit | Attending: Neurology | Admitting: Neurology

## 2016-09-03 DIAGNOSIS — M48061 Spinal stenosis, lumbar region without neurogenic claudication: Secondary | ICD-10-CM

## 2016-09-03 DIAGNOSIS — M545 Low back pain: Secondary | ICD-10-CM

## 2016-09-03 DIAGNOSIS — M47816 Spondylosis without myelopathy or radiculopathy, lumbar region: Secondary | ICD-10-CM

## 2016-09-04 ENCOUNTER — Telehealth: Payer: Self-pay | Admitting: Neurology

## 2016-09-04 NOTE — Telephone Encounter (Signed)
Left message on machine for patient to call back.

## 2016-09-04 NOTE — Telephone Encounter (Signed)
-----   Message from Jay, DO sent at 09/04/2016  3:13 PM EDT ----- Let pt know I reviewed MRI Lumbar spine.  Fairly significant degenerative changes and disc protrusions.  May want to consider neurosx opinion

## 2016-09-04 NOTE — Telephone Encounter (Signed)
Patient made aware of results. Okay with referral to neurosurgery. Referral faxed to [facility] at [phone] with confirmation received. They will contact the patient to schedule.

## 2016-09-07 ENCOUNTER — Telehealth: Payer: Self-pay | Admitting: Neurology

## 2016-09-07 NOTE — Telephone Encounter (Signed)
-----   Message from Elenora Fender sent at 09/07/2016  3:20 PM EDT ----- She said he is unaware of this appointment. She asked if you would call the patient and then let her know. If you need me to just let me know. Thanks ----- Message ----- From: Elenora Fender Sent: 09/07/2016   3:04 PM To: Annamaria Helling, CMA  Robert Mccall 2051-06-11. Becky from  Dr. [name] office called regarding a referral that we sent to his office. He is scheduled to see Dr. [name] on 09/29/16. He is to arrive at 12:00 noon for a 12:30 appointment.  Becky's # is [id number]. Thank you

## 2016-09-07 NOTE — Telephone Encounter (Signed)
Called patient and made him aware of date and time.

## 2016-09-15 ENCOUNTER — Ambulatory Visit: Payer: PRIVATE HEALTH INSURANCE | Admitting: Internal Medicine

## 2016-09-16 ENCOUNTER — Ambulatory Visit (INDEPENDENT_AMBULATORY_CARE_PROVIDER_SITE_OTHER): Payer: PRIVATE HEALTH INSURANCE | Admitting: Internal Medicine

## 2016-09-16 ENCOUNTER — Encounter: Payer: Self-pay | Admitting: Internal Medicine

## 2016-09-16 VITALS — BP 130/88 | HR 82 | Temp 98.2°F | Ht 64.5 in | Wt 175.8 lb

## 2016-09-16 DIAGNOSIS — Z86010 Personal history of colonic polyps: Secondary | ICD-10-CM

## 2016-09-16 DIAGNOSIS — I10 Essential (primary) hypertension: Secondary | ICD-10-CM

## 2016-09-16 DIAGNOSIS — F900 Attention-deficit hyperactivity disorder, predominantly inattentive type: Secondary | ICD-10-CM | POA: Diagnosis not present

## 2016-09-16 MED ORDER — METHYLPHENIDATE HCL ER (XR) 40 MG PO CP24: 40.0000 mg | ORAL_CAPSULE | ORAL | 0 refills | Status: DC

## 2016-09-16 NOTE — Patient Instructions (Signed)
Hypertension Hypertension, commonly called high blood pressure, is when the force of blood pumping through your arteries is too strong. Your arteries are the blood vessels that carry blood from your heart throughout your body. A blood pressure reading consists of a higher number over a lower number, such as 110/72. The higher number (systolic) is the pressure inside your arteries when your heart pumps. The lower number (diastolic) is the pressure inside your arteries when your heart relaxes. Ideally you want your blood pressure below 120/80. Hypertension forces your heart to work harder to pump blood. Your arteries may become narrow or stiff. Having untreated or uncontrolled hypertension can cause heart attack, stroke, kidney disease, and other problems. RISK FACTORS Some risk factors for high blood pressure are controllable. Others are not.  Risk factors you cannot control include:   Race. You may be at higher risk if you are African American.  Age. Risk increases with age.  Gender. Men are at higher risk than women before age 45 years. After age 65, women are at higher risk than men. Risk factors you can control include:  Not getting enough exercise or physical activity.  Being overweight.  Getting too much fat, sugar, calories, or salt in your diet.  Drinking too much alcohol. SIGNS AND SYMPTOMS Hypertension does not usually cause signs or symptoms. Extremely high blood pressure (hypertensive crisis) may cause headache, anxiety, shortness of breath, and nosebleed. DIAGNOSIS To check if you have hypertension, your health care provider will measure your blood pressure while you are seated, with your arm held at the level of your heart. It should be measured at least twice using the same arm. Certain conditions can cause a difference in blood pressure between your right and left arms. A blood pressure reading that is higher than normal on one occasion does not mean that you need treatment. If  it is not clear whether you have high blood pressure, you may be asked to return on a different day to have your blood pressure checked again. Or, you may be asked to monitor your blood pressure at home for 1 or more weeks. TREATMENT Treating high blood pressure includes making lifestyle changes and possibly taking medicine. Living a healthy lifestyle can help lower high blood pressure. You may need to change some of your habits. Lifestyle changes may include:  Following the DASH diet. This diet is high in fruits, vegetables, and whole grains. It is low in salt, red meat, and added sugars.  Keep your sodium intake below 2,300 mg per day.  Getting at least 30-45 minutes of aerobic exercise at least 4 times per week.  Losing weight if necessary.  Not smoking.  Limiting alcoholic beverages.  Learning ways to reduce stress. Your health care provider may prescribe medicine if lifestyle changes are not enough to get your blood pressure under control, and if one of the following is true:  You are 18-59 years of age and your systolic blood pressure is above 140.  You are 60 years of age or older, and your systolic blood pressure is above 150.  Your diastolic blood pressure is above 90.  You have diabetes, and your systolic blood pressure is over 140 or your diastolic blood pressure is over 90.  You have kidney disease and your blood pressure is above 140/90.  You have heart disease and your blood pressure is above 140/90. Your personal target blood pressure may vary depending on your medical conditions, your age, and other factors. HOME CARE INSTRUCTIONS    Have your blood pressure rechecked as directed by your health care provider.   Take medicines only as directed by your health care provider. Follow the directions carefully. Blood pressure medicines must be taken as prescribed. The medicine does not work as well when you skip doses. Skipping doses also puts you at risk for  problems.  Do not smoke.   Monitor your blood pressure at home as directed by your health care provider. SEEK MEDICAL CARE IF:   You think you are having a reaction to medicines taken.  You have recurrent headaches or feel dizzy.  You have swelling in your ankles.  You have trouble with your vision. SEEK IMMEDIATE MEDICAL CARE IF:  You develop a severe headache or confusion.  You have unusual weakness, numbness, or feel faint.  You have severe chest or abdominal pain.  You vomit repeatedly.  You have trouble breathing. MAKE SURE YOU:   Understand these instructions.  Will watch your condition.  Will get help right away if you are not doing well or get worse.   This information is not intended to replace advice given to you by your health care provider. Make sure you discuss any questions you have with your health care provider.   Document Released: 11/23/2005 Document Revised: 04/09/2015 Document Reviewed: 09/15/2013 Elsevier Interactive Patient Education 2016 [employer]  

## 2016-09-16 NOTE — Progress Notes (Signed)
Subjective:  Patient ID: Robert Mccall, male    DOB: 05/31/1951  Age: 65 y.o. MRN: [id number]  CC: Hypertension   HPI NICLAS NAJJAR presents for a BP check. He tells me his blood pressure has been well controlled on the thiazide diuretic and calcium channel blocker. His had no recent episodes of headache/blurred vision/chest pain/shortness of breath/palpitations/edema/fatigue.  Pt states ADD status overall stable on current meds with overall good compliance and tolerability, and good effectiveness with respect to ability for concentration and task completion.  He is seeing neurosurgery about Lumbar - DDD and thinks he may end up needing a surgical intervention. His pain is currently well controlled with gabapentin.  Outpatient Medications Prior to Visit  Medication Sig Dispense Refill  . alfuzosin (UROXATRAL) 10 MG 24 hr tablet Take 1 tablet (10 mg total) by mouth daily with breakfast. 90 tablet 1  . aspirin 81 MG tablet Take 81 mg by mouth daily.      . bimatoprost (LUMIGAN) 0.03 % ophthalmic solution INT 1 GTT INTO OD HS  0  . Cholecalciferol (VITAMIN D) 2000 UNITS CAPS Take 1 capsule (2,000 Units total) by mouth 1 dose over 46 hours. 30 capsule 11  . Cyanocobalamin (B-12) 2000 MCG TABS Take 2,000 mcg by mouth.    . DULoxetine (CYMBALTA) 30 MG capsule TAKE 1 CAPSULE DAILY. 30 capsule 5  . esomeprazole (NEXIUM) 40 MG capsule TAKE 1 CAPSULE(40 MG) BY MOUTH DAILY 90 capsule 2  . finasteride (PROSCAR) 5 MG tablet Take 1 tablet (5 mg total) by mouth daily. 90 tablet 1  . gabapentin (NEURONTIN) 300 MG capsule Take 1 capsule (300 mg total) by mouth 3 (three) times daily. 270 capsule 1  . hydrochlorothiazide (MICROZIDE) 12.5 MG capsule Take 1 capsule (12.5 mg total) by mouth daily. 90 capsule 3  . KRILL OIL 1000 MG CAPS Take 1 capsule by mouth daily.      Marland Kitchen lamoTRIgine (LAMICTAL) 25 MG tablet Take 3 tablets (75 mg total) by mouth 2 (two) times daily. 540 tablet 1  . meclizine (ANTIVERT) 12.5  MG tablet Take 1 tablet (12.5 mg total) by mouth 3 (three) times daily as needed for dizziness. 30 tablet 0  . niacin 500 MG tablet Take 500 mg by mouth daily with breakfast.      . simvastatin (ZOCOR) 40 MG tablet Take 1 tablet (40 mg total) by mouth every evening. 90 tablet 3  . verapamil (CALAN-SR) 240 MG CR tablet Take 1 tablet (240 mg total) by mouth daily. 90 tablet 1  . Methylphenidate HCl ER, XR, 40 MG CP24 Take 40 mg by mouth every morning. 30 capsule 0   No facility-administered medications prior to visit.     ROS Review of Systems  Constitutional: Negative for chills, fatigue and fever.  HENT: Negative.   Eyes: Negative for visual disturbance.  Respiratory: Negative.  Negative for cough, choking, chest tightness, shortness of breath and stridor.   Cardiovascular: Negative.  Negative for chest pain, palpitations and leg swelling.  Gastrointestinal: Negative.  Negative for abdominal pain, blood in stool, constipation, diarrhea and vomiting.  Endocrine: Negative.   Genitourinary: Negative.  Negative for difficulty urinating.  Musculoskeletal: Positive for back pain. Negative for arthralgias, joint swelling, myalgias and neck pain.  Skin: Negative.   Allergic/Immunologic: Negative.   Neurological: Negative.  Negative for dizziness, tremors, weakness, light-headedness, numbness and headaches.  Hematological: Negative.  Negative for adenopathy. Does not bruise/bleed easily.  Psychiatric/Behavioral: Positive for decreased concentration. Negative for  agitation, behavioral problems, confusion, sleep disturbance and suicidal ideas. The patient is not hyperactive.     Objective:  BP 130/88 (BP Location: Left Arm, Patient Position: Sitting, Cuff Size: Normal)   Pulse 82   Temp 98.2 F (36.8 C) (Oral)   Ht 5' 4.5" (1.638 m)   Wt 175 lb 12 oz (79.7 kg)   SpO2 96%   BMI 29.70 kg/m   BP Readings from Last 3 Encounters:  09/16/16 130/88  08/26/16 (!) 147/91  06/11/16 130/69     Wt Readings from Last 3 Encounters:  09/16/16 175 lb 12 oz (79.7 kg)  08/26/16 173 lb 11.2 oz (78.8 kg)  06/11/16 171 lb 4 oz (77.7 kg)    Physical Exam  Constitutional: He is oriented to person, place, and time. No distress.  HENT:  Mouth/Throat: Oropharynx is clear and moist. No oropharyngeal exudate.  Eyes: Conjunctivae are normal. Right eye exhibits no discharge. Left eye exhibits no discharge. No scleral icterus.  Neck: Normal range of motion. Neck supple. No JVD present. No tracheal deviation present. No thyromegaly present.  Cardiovascular: Normal rate, regular rhythm, normal heart sounds and intact distal pulses.  Exam reveals no gallop and no friction rub.   No murmur heard. Pulmonary/Chest: Effort normal and breath sounds normal. No stridor. No respiratory distress. He has no wheezes. He has no rales. He exhibits no tenderness.  Abdominal: Soft. Bowel sounds are normal. He exhibits no distension and no mass. There is no tenderness. There is no rebound and no guarding.  Musculoskeletal: Normal range of motion. He exhibits no edema, tenderness or deformity.  Lymphadenopathy:    He has no cervical adenopathy.  Neurological: He is oriented to person, place, and time.  Skin: Skin is warm and dry. No rash noted. He is not diaphoretic. No erythema. No pallor.  Psychiatric: He has a normal mood and affect. His behavior is normal. Judgment and thought content normal.  Vitals reviewed.   Lab Results  Component Value Date   WBC 6.8 08/18/2016   HGB 14.9 08/18/2016   HCT 44.1 08/18/2016   PLT 251 08/18/2016   GLUCOSE 116 08/18/2016   CHOL 126 08/24/2016   TRIG 173 (H) 08/24/2016   HDL 41 08/24/2016   LDLCALC 50 08/24/2016   ALT 24 08/18/2016   AST 21 08/18/2016   NA 140 08/18/2016   K 3.5 08/18/2016   CL 101 02/03/2016   CREATININE 1.1 08/18/2016   BUN 17.2 08/18/2016   CO2 26 08/18/2016   TSH 1.27 02/03/2016   PSA 0.38 02/03/2016   HGBA1C 5.5 08/24/2016    Mr  Lumbar Spine Wo Contrast  Result Date: 09/03/2016 CLINICAL DATA:  65 year old male with lumbar back pain, bilateral thigh pain. Chronic but progressive symptoms. No known injury. Initial encounter. EXAM: MRI LUMBAR SPINE WITHOUT CONTRAST TECHNIQUE: Multiplanar, multisequence MR imaging of the lumbar spine was performed. No intravenous contrast was administered. COMPARISON:  Metastatic skeletal bone survey 08/18/2016. Lumbar MRI 10/12/2013. FINDINGS: Segmentation: Normal lumbar segmentation which is the same numbering system used on the 2014 MRI. Alignment: Chronic mild retrolisthesis of L2 on L3 and L3 on L4, not significantly changed. Overall stable vertebral height and alignment since 2014. Vertebrae: Chronic degenerative endplate changes in the lumbar spine, maximal at L2-L3 and L3-L4. Trace associated degenerative appearing endplate marrow edema at those levels. Mild degenerative appearing posterior endplate marrow edema inferiorly at L1. No acute osseous abnormality identified. Conus medullaris: Extends to the L1-L2 level and appears normal. Paraspinal and other  soft tissues: Stable and negative. Disc levels: T10-T11: Disc desiccation and disc space loss. Broad-based posterior disc protrusion. Borderline to mild spinal stenosis. Mild facet hypertrophy. Mild bilateral T10 foraminal stenosis. T11-12: Disc desiccation. Minimal disc bulge. Mild facet hypertrophy. No stenosis. T12-L1: Chronic mild left eccentric circumferential disc bulge. Slightly increased broad-based posterior component of disc. Mild facet hypertrophy. Borderline to mild spinal stenosis is increased. No foraminal stenosis. L1-L2: Chronic circumferential disc bulge with broad-based posterior component. Stable mild facet hypertrophy. Slight increased facet joint fluid on the right. Borderline to mild spinal stenosis and left lateral recess stenosis have not significantly changed. L2-L3: Chronic severe disc space loss. Bulky right eccentric  circumferential disc osteophyte complex. Mild facet hypertrophy appears stable. Chronic moderate to severe right lateral recess stenosis. Mild spinal stenosis. Mild left and moderate right L2 foraminal stenosis. This level appears stable. L3-L4: Chronic severe disc space loss. Bulky circumferential disc osteophyte complex with broad-based posterior component. Moderate facet hypertrophy. Epidural lipomatosis. Moderate to severe spinal stenosis (series 4, image 23). Right greater than left associated lateral recess stenosis. Moderate to severe bilateral L3 foraminal stenosis. This level is stable. L4-L5: Chronic left eccentric circumferential disc bulge with broad-based posterior component. Chronic moderate facet and ligament flavum hypertrophy. New caudal disc extrusion in the midline (series 4, image 30 and series 2, image 7) with sequestered disc fragment posterior to the L5 vertebral body measuring 6-7 mm in diameter and 13-14 mm in length. Severe involvement of the descending left L5 nerve roots (series 4, image 31). Severe spinal and left lateral recess stenosis at the disc space level has mildly progressed. Moderate to severe bilateral foraminal stenosis has not significantly changed. L5-S1: Left eccentric circumferential disc osteophyte complex. Moderate facet hypertrophy. Mild to moderate chronic left lateral recess stenosis is stable. Severe left L5 foraminal stenosis is stable. IMPRESSION: 1. Symptomatic level might be L4-L5 where a new caudal disc extrusion with sequestered disc fragment is superimposed on fairly advanced chronic disc, endplate, and facet degeneration. Severe spinal and moderate to severe biforaminal stenosis at that level has not significantly changed but there is increased impingement of the descending left L5 nerve roots. 2. Chronic moderate to severe spinal and foraminal stenosis at L3-L4 is stable. Chronic L2-L3 moderate to severe right lateral recess and mild spinal stenosis is  stable. Chronic up to moderate left lateral recess and severe left foraminal stenosis at L5-S1 is stable. 3. Increased borderline to mild lower thoracic spinal stenosis at T10-T11 and T12-L1. Electronically Signed   By: Genevie Ann M.D.   On: 09/03/2016 11:05    Assessment & Plan:   Daysen was seen today for hypertension.  Diagnoses and all orders for this visit:  History of colonic polyps -     Ambulatory referral to Gastroenterology  Attention deficit hyperactivity disorder (ADHD), predominantly inattentive type -     Discontinue: Methylphenidate HCl ER, XR, 40 MG CP24; Take 40 mg by mouth every morning. -     Discontinue: Methylphenidate HCl ER, XR, 40 MG CP24; Take 40 mg by mouth every morning. -     Discontinue: Methylphenidate HCl ER, XR, 40 MG CP24; Take 40 mg by mouth every morning. -     Methylphenidate HCl ER, XR, 40 MG CP24; Take 40 mg by mouth every morning.  Essential hypertension- his blood pressure is well-controlled   I am having Mr. Vankleeck maintain his aspirin, Krill Oil, niacin, Vitamin D, DULoxetine, lamoTRIgine, finasteride, gabapentin, verapamil, alfuzosin, hydrochlorothiazide, simvastatin, meclizine, bimatoprost, B-12, esomeprazole, and  Methylphenidate HCl ER (XR).  Meds ordered this encounter  Medications  . DISCONTD: Methylphenidate HCl ER, XR, 40 MG CP24    Sig: Take 40 mg by mouth every morning.    Dispense:  30 capsule    Refill:  0    May fill once every 30 days. Nov  2017  . DISCONTD: Methylphenidate HCl ER, XR, 40 MG CP24    Sig: Take 40 mg by mouth every morning.    Dispense:  30 capsule    Refill:  0    May fill once every 30 days. Oct  2017  . DISCONTD: Methylphenidate HCl ER, XR, 40 MG CP24    Sig: Take 40 mg by mouth every morning.    Dispense:  30 capsule    Refill:  0    May fill once every 30 days. Dec  2017  . Methylphenidate HCl ER, XR, 40 MG CP24    Sig: Take 40 mg by mouth every morning.    Dispense:  30 capsule    Refill:  0    May fill  once every 30 days. Jan  2018     Follow-up: Return in about 4 months (around 01/17/2017).  Scarlette Calico, MD

## 2016-09-16 NOTE — Progress Notes (Signed)
Pre visit review using our clinic review tool, if applicable. No additional management support is needed unless otherwise documented below in the visit note. 

## 2016-09-22 ENCOUNTER — Encounter: Payer: Self-pay | Admitting: Gastroenterology

## 2016-09-30 ENCOUNTER — Ambulatory Visit: Payer: PRIVATE HEALTH INSURANCE | Admitting: Internal Medicine

## 2016-10-08 ENCOUNTER — Encounter: Payer: Self-pay | Admitting: Internal Medicine

## 2016-10-09 NOTE — Telephone Encounter (Signed)
See pt email. Thanks in advance for your help!

## 2016-10-12 ENCOUNTER — Telehealth: Payer: Self-pay

## 2016-10-12 NOTE — Telephone Encounter (Signed)
Left vm for patient to call back to reschedule this procedure.   Hi Almyra Free,  If this colonoscopy is for screening purposes, it may be best to reschedule a few more weeks later to ensure he can tolerate lying on his side and has more fully recovered from his surgery. Thanks    ----- Message -----  From: Doristine Counter, RN  Sent: 10/12/2016 12:24 PM  To: Manus Gunning, MD  Subject: FW: Non-Urgent Medical Question           Please see message below re: if he should reschedule.   Thanks,  Almyra Free  ----- Message -----  From: Macie Burows  Sent: 10/09/2016  4:02 PM  To: Doristine Counter, RN  Subject: RE: Non-Urgent Medical Question           ----- Message from [facility], [place] sent at 10/09/2016 4:02 PM EDT -----      ----- Message from Macie Burows to [name], MD sent at 10/09/2016 2:20 PM -----   Colletta [place],  I don't know if Dr. Ronnald Ramp copied you on his response. It is below and he indicated that he thought it would be okay to do both but to let the doctor know.  -----  RE: Non-Urgent Medical Question  I think you can probably do both but definitely let the doctor that is doing your colonoscopy know about this    -----  From: Hardin Negus. [place]  Sent: 10/08/2016 5:40 PM EDT  To: Scarlette Calico, MD  Subject: Non-Urgent Medical Question  We had scheduled me to have a colonoscopy on December 1. However, I am now scheduled to have a microdiskectomy on November 15th. Should we cancel the colonoscopy?  ----- Message -----  From: CMA Stefannie C  Sent: 10/09/2016 8:25 AM EDT  To: Hardin Negus. [place]ol[place]  Subject: RE: Non-Urgent Medical Question  Good morning,     I would think that the GI provider scheduled to do the colonoscopy would be better equipped to answer your question.   I am forwarding this to Dr. Ronnald Ramp as an Juluis Rainier.   I do hope you have a wonderful day.   Let us know if we can be of any additional assistance.   Stefannie/CMA    -----  Message -----   From: Hardin Negus. Imran   Sent: 10/08/2016 5:40 PM EDT    To: Scarlette Calico, MD  Subject: Non-Urgent Medical Question    We had scheduled me to have a colonoscopy on December 1. However, I am now scheduled to have a microdiskectomy on November 15th. Should we cancel the colonoscopy?

## 2016-10-12 NOTE — Telephone Encounter (Signed)
Spoke to patient, he is rescheduled for his colonoscopy from 11/06/16 to 11/24/16 due to back surgery that is to take place on 10/21/16.

## 2016-10-15 ENCOUNTER — Encounter: Payer: Self-pay | Admitting: Neurology

## 2016-10-16 ENCOUNTER — Ambulatory Visit: Payer: PRIVATE HEALTH INSURANCE | Admitting: *Deleted

## 2016-10-16 VITALS — Ht 63.5 in | Wt 174.2 lb

## 2016-10-16 DIAGNOSIS — Z86010 Personal history of colonic polyps: Secondary | ICD-10-CM

## 2016-10-16 MED ORDER — SUPREP BOWEL PREP KIT 17.5-3.13-1.6 GM/177ML PO SOLN: 1.0000 | Freq: Once | ORAL | 0 refills | Status: AC

## 2016-10-16 NOTE — Progress Notes (Signed)
Patient denies any allergies to egg or soy products. Patient denies complications with anesthesia/sedation.  Patient denies oxygen use at home and denies diet medications. Emmi instructions for colonoscopy explained and given to patient.  

## 2016-10-21 HISTORY — PX: LUMBAR DISC SURGERY: SHX700

## 2016-11-06 ENCOUNTER — Encounter: Payer: PRIVATE HEALTH INSURANCE | Admitting: Gastroenterology

## 2016-11-09 ENCOUNTER — Encounter: Payer: Self-pay | Admitting: Internal Medicine

## 2016-11-15 NOTE — Progress Notes (Signed)
Subjective:   Robert Mccall was seen in consultation in the movement disorder clinic at the request of Scarlette Calico, MD.  The evaluation is for tremor.  The patient is a 65 y.o. right handed male with a history of tremor.   The patient notes tremor for the last few years; he notes it primarily when he uses it and notes it when he puts toothpaste on the toothbrush.  No new meds at the time.  Reports that he has been on methylphenidate for years and cymbalta started about 1 year ago.   There is no family hx of tremor.    Affected by caffeine:  No. (couple soda/pepsi free per day) Affected by alcohol:  Yes.   (drinks 1-2 glasses wine/day - helps tremor) Affected by stress:  No. Affected by fatigue:  No. Spills soup if on spoon:  No. Spills glass of liquid if full:  No. Affects ADL's (tying shoes, brushing teeth, etc):  Yes.   (notes it buttoning shirt/brushing teeth)  Current/Previously tried tremor medications: n/a  Current medications that may exacerbate tremor:  Methylphenidate, cymbalta  Pt also c/o memory change since last fall.  States that his coworkers have noted little mistakes on important reports at work.  He was having trouble typing that may have contributed but that got better.  He does do the finances in his house without difficulty.  He states that all bills are drafted but they always have been.  He is able to drive and get from place to place without getting lost.  No MVA's in the recent years.  Distributes medications to himself and remembers to take properly.  11/17/16 update:  Pt f/u today.  He had an MRI lumbar spine since our last visit that I had the opportunity to review.  This demonstrated: 1. a new caudal discextrusion at L4-5 with sequestered disc fragment which is superimposed on fairlycadvanced chronic disc, endplate, and facet degeneration. Severe spinal and moderate to severe biforaminal stenosis at that level has not significantly changed but there is increased  impingement of the descending left L5 nerve roots.  2. Chronic moderate to severe spinal and foraminal stenosis at L3-L4 is stable. Chronic L2-L3 moderate to severe right lateral recess and mild spinal stenosis is stable. Chronic up to moderate left lateral recess and severe left foraminal stenosis at L5-S1 is stable. 3. Increased borderline to mild lower thoracic spinal stenosis at T10-T11 and T12-L1.  Neurosx opinion was advised and the pt saw Dr. [name] on 09/29/16.  I reviewed those records.  The patient ultimately had microdiscectomy on 11/15.  Last visit, labs were done, which identified a slightly low B12 and he is now on a supplement.  IgG MGUS also identified and he saw Dr. [name] in July and September.  She will be following him one time a year.  In regards to tremor, he states that it is a bit less, unless he is at a social dinner.  Memory has been stable.  Noting some pinky and ring finger paresthesias on the L upon awakening but goes away when wakes up; injured elbow about a year ago.    Outside reports reviewed: historical medical records, lab reports and referral letter/letters.  No Known Allergies  Outpatient Encounter Prescriptions as of 11/17/2016  Medication Sig  . alfuzosin (UROXATRAL) 10 MG 24 hr tablet Take 1 tablet (10 mg total) by mouth daily with breakfast.  . aspirin 81 MG tablet Take 81 mg by mouth daily.    Marland Kitchen  bimatoprost (LUMIGAN) 0.03 % ophthalmic solution INT 1 GTT INTO OD HS  . Cholecalciferol (VITAMIN D) 2000 UNITS CAPS Take 1 capsule (2,000 Units total) by mouth 1 dose over 46 hours.  . Cyanocobalamin (B-12) 2000 MCG TABS Take 2,000 mcg by mouth.  . DULoxetine (CYMBALTA) 30 MG capsule TAKE 1 CAPSULE DAILY.  Marland Kitchen esomeprazole (NEXIUM) 40 MG capsule TAKE 1 CAPSULE(40 MG) BY MOUTH DAILY  . finasteride (PROSCAR) 5 MG tablet Take 1 tablet (5 mg total) by mouth daily.  Marland Kitchen gabapentin (NEURONTIN) 300 MG capsule Take 1 capsule (300 mg total) by mouth 3 (three) times daily.  .  hydrochlorothiazide (MICROZIDE) 12.5 MG capsule Take 1 capsule (12.5 mg total) by mouth daily.  Marland Kitchen KRILL OIL 1000 MG CAPS Take 1 capsule by mouth daily.    Marland Kitchen lamoTRIgine (LAMICTAL) 25 MG tablet Take 3 tablets (75 mg total) by mouth 2 (two) times daily.  . meclizine (ANTIVERT) 12.5 MG tablet Take 1 tablet (12.5 mg total) by mouth 3 (three) times daily as needed for dizziness.  . Methylphenidate HCl ER, XR, 40 MG CP24 Take 40 mg by mouth every morning.  . niacin 500 MG tablet Take 500 mg by mouth daily with breakfast.    . simvastatin (ZOCOR) 40 MG tablet Take 1 tablet (40 mg total) by mouth every evening.  . verapamil (CALAN-SR) 240 MG CR tablet Take 1 tablet (240 mg total) by mouth daily.   No facility-administered encounter medications on file as of 11/17/2016.     Past Medical History:  Diagnosis Date  . ADHD (attention deficit hyperactivity disorder)   . Anxiety   . Benign prostatic hypertrophy   . Chronic lower back pain    surgery schedule for back 10/21/16  . Cyst of right kidney    incidental [place] 1.3cm on R (MRI abd 09/2014), follows with uro  . Depression   . GERD (gastroesophageal reflux disease)   . Glaucoma   . History of migraine headaches    no meds  . Hyperlipidemia   . Hypertension     Past Surgical History:  Procedure Laterality Date  . COLONOSCOPY  09/2007   polyps/ [place] Ina  . HYDROCELE EXCISION Right 1990  . LUMBAR DISC SURGERY  10/21/2016  . ORCHIECTOMY Left 1970  . WISDOM TOOTH EXTRACTION      Social History   Social History  . Marital status: Single    Spouse name: N/A  . Number of children: N/A  . Years of education: N/A   Occupational History  . Consulting civil engineer   Social History Main Topics  . Smoking status: Never Smoker  . Smokeless tobacco: Never Used  . Alcohol use Yes     Comment: 1-2 glasses wine per day  . Drug use: No  . Sexual activity: Yes     Comment: working with replacement   Other Topics  Concern  . Not on file   Social History Narrative  . No narrative on file    Family Status  Relation Status  . Mother Deceased at age 38   COPD, heart disease, breast ca  . Father Alive   DM, blind  . Brother Alive   stroke  . Sister Alive   healthy  . Sister Alive   bipolar, lupus  . Son Alive   healthy  . Daughter Alive   healthy  . Neg Hx     Review of Systems  A complete 10 system ROS was obtained  and was negative apart from what is mentioned.   Objective:   VITALS:   Vitals:   11/17/16 0816  BP: (!) 142/80  Pulse: (!) 108  Weight: 169 lb (76.7 kg)  Height: 5' 3.5" (1.613 m)   Gen:  Appears stated age and in NAD.  He is anxious HEENT:  Normocephalic, atraumatic. The mucous membranes are moist. The superficial temporal arteries are without ropiness or tenderness. Cardiovascular: tachycardic.  Regular rhythm. Lungs: Clear to auscultation bilaterally. Neck: There are no carotid bruits noted bilaterally.  NEUROLOGICAL:  Orientation:   Montreal Cognitive Assessment  05/18/2016  Visuospatial/ Executive (0/5) 5  Naming (0/3) 2  Attention: Read list of digits (0/2) 2  Attention: Read list of letters (0/1) 1  Attention: Serial 7 subtraction starting at 100 (0/3) 3  Language: Repeat phrase (0/2) 2  Language : Fluency (0/1) 1  Abstraction (0/2) 2  Delayed Recall (0/5) 4  Orientation (0/6) 6  Total 28  Adjusted Score (based on education) 28   Cranial nerves: There is good facial symmetry. Speech is fluent and clear. Soft palate rises symmetrically and there is no tongue deviation. Hearing is intact to conversational tone. Tone: Tone is good throughout. Sensation: Sensation is intact to light touch throughout Coordination:  The patient has no dysdiadichokinesia or dysmetria. Motor: Strength is 5/5 in the bilateral upper and lower extremities.  Shoulder shrug is equal bilaterally.  There is no pronator drift.  There are no fasciculations noted (pt in exam  gown/shorts. DTR's: Deep tendon reflexes are 2/4 at the bilateral biceps, triceps, brachioradialis, 3/4 at the bilateral patella. Gait and Station: The patient is able to ambulate without difficulty.   MOVEMENT EXAM: Tremor:  There is no rest tremor today.  He has mild tremor of the outstretched hand.    LABS  Lab Results  Component Value Date   TSH 1.27 02/03/2016   Lab Results  Component Value Date   VITAMINB12 281 05/18/2016    Lab Results  Component Value Date   FOLATE 20.3 05/18/2016      Chemistry      Component Value Date/Time   NA 140 08/18/2016 0953   K 3.5 08/18/2016 0953   CL 101 02/03/2016 0911   CO2 26 08/18/2016 0953   BUN 17.2 08/18/2016 0953   CREATININE 1.1 08/18/2016 0953   GLU 102 08/02/2015      Component Value Date/Time   CALCIUM 9.2 08/18/2016 0953   ALKPHOS 59 08/18/2016 0953   AST 21 08/18/2016 0953   ALT 24 08/18/2016 0953   BILITOT 0.95 08/18/2016 0953        Lab Results  Component Value Date   HGBA1C 5.5 08/24/2016      Assessment/Plan:   1.  Tremor.  -Pt feels that he is doing much better and looks better today.  Don't see anything that looks neurodegenerative.  Was not anxious and no [place] today.  Tremor may be exacerbated by medication, especially methylphenidate.  Cymbalta may also contribute to tremor.   2.  Lumbar degenerative changes  -s/p microdiscectomy on 10/21/16  3.  Peripheral neuropathy with B12 deficiency and IgG MGUS  -limit EtOH  -B12 supplement 1064mcg daily  -continue yearly f/u with Dr. [name]  4.  Memory Loss  -I saw no evidence of a neurodegenerative memory loss.  If it becomes more of an issue, we can schedule him at our office for neuropsych testing.    5.  Probable ulnar neuropathy at elbow  -only sx's  at night  -educated pt  -no further tx unless sx's in day and persistent  6.  F/u prn

## 2016-11-17 ENCOUNTER — Ambulatory Visit (INDEPENDENT_AMBULATORY_CARE_PROVIDER_SITE_OTHER): Payer: PRIVATE HEALTH INSURANCE | Admitting: Neurology

## 2016-11-17 ENCOUNTER — Encounter: Payer: Self-pay | Admitting: Neurology

## 2016-11-17 VITALS — BP 142/80 | HR 108 | Ht 63.5 in | Wt 169.0 lb

## 2016-11-17 DIAGNOSIS — R251 Tremor, unspecified: Secondary | ICD-10-CM | POA: Diagnosis not present

## 2016-11-17 DIAGNOSIS — G5622 Lesion of ulnar nerve, left upper limb: Secondary | ICD-10-CM

## 2016-11-19 ENCOUNTER — Other Ambulatory Visit: Payer: Self-pay | Admitting: Internal Medicine

## 2016-11-24 ENCOUNTER — Ambulatory Visit (AMBULATORY_SURGERY_CENTER): Payer: PRIVATE HEALTH INSURANCE | Admitting: Gastroenterology

## 2016-11-24 ENCOUNTER — Encounter: Payer: Self-pay | Admitting: Gastroenterology

## 2016-11-24 VITALS — BP 119/76 | HR 80 | Temp 98.2°F | Resp 12 | Ht 63.5 in | Wt 174.0 lb

## 2016-11-24 DIAGNOSIS — Z860100 Personal history of colon polyps, unspecified: Secondary | ICD-10-CM

## 2016-11-24 LAB — HM COLONOSCOPY

## 2016-11-24 MED ORDER — SODIUM CHLORIDE 0.9 % IV SOLN: 500.0000 mL | INTRAVENOUS | Status: DC

## 2016-11-24 NOTE — Progress Notes (Signed)
Report given to PACU RN, vss 

## 2016-11-24 NOTE — Op Note (Signed)
[facility] Patient Name: Robert Mccall Procedure Date: 11/24/2016 3:30 PM MRN: [id number] Endoscopist: Remo Lipps P. Keriann Rankin MD, MD Age: 65 Referring MD:  Date of Birth: [birth year] Gender: Male Account #: [id number] Procedure:                Colonoscopy Indications:              High risk colon cancer surveillance: Personal                            history of colonic polyps - small adenoma removed                            in 2008 Medicines:                Monitored Anesthesia Care Procedure:                Pre-Anesthesia Assessment:                           - Prior to the procedure, a History and Physical                            was performed, and patient medications and                            allergies were reviewed. The patient's tolerance of                            previous anesthesia was also reviewed. The risks                            and benefits of the procedure and the sedation                            options and risks were discussed with the patient.                            All questions were answered, and informed consent                            was obtained. Prior Anticoagulants: The patient has                            taken aspirin, last dose was 1 day prior to                            procedure. ASA Grade Assessment: II - A patient                            with mild systemic disease. After reviewing the                            risks and benefits, the patient was deemed in  satisfactory condition to undergo the procedure.                           After obtaining informed consent, the colonoscope                            was passed under direct vision. Throughout the                            procedure, the patient's blood pressure, pulse, and                            oxygen saturations were monitored continuously. The                            Model CF-HQ190L [id number]) scope was introduced                            through the anus and advanced to the the cecum,                            identified by appendiceal orifice and ileocecal                            valve. The colonoscopy was performed without                            difficulty. The patient tolerated the procedure                            well. The quality of the bowel preparation was                            good. The ileocecal valve, appendiceal orifice, and                            rectum were photographed. Scope In: 3:44:28 PM Scope Out: 3:57:18 PM Scope Withdrawal Time: 0 hours 11 minutes 2 seconds  Total Procedure Duration: 0 hours 12 minutes 50 seconds  Findings:                 The perianal and digital rectal examinations were                            normal.                           Many small and large-mouthed diverticula were found                            in the entire colon.                           Internal hemorrhoids were found during retroflexion.  The exam was otherwise without abnormality. No                            polyps Complications:            No immediate complications. Estimated blood loss:                            None. Estimated Blood Loss:     Estimated blood loss: none. Impression:               - Diverticulosis in the entire examined colon.                           - Internal hemorrhoids.                           - The examination was otherwise normal.                           - No polyps Recommendation:           - Patient has a contact number available for                            emergencies. The signs and symptoms of potential                            delayed complications were discussed with the                            patient. Return to normal activities tomorrow.                            Written discharge instructions were provided to the                            patient.                           - Resume  previous diet.                           - Continue present medications.                           - Repeat colonoscopy in 10 years for screening                            purposes. Remo Lipps P. Karime Scheuermann MD, MD 11/24/2016 4:00:21 PM This report has been signed electronically.

## 2016-11-24 NOTE — Patient Instructions (Signed)
YOU HAD AN ENDOSCOPIC PROCEDURE TODAY AT [facility]:   Refer to the procedure report that was given to you for any specific questions about what was found during the examination.  If the procedure report does not answer your questions, please call your gastroenterologist to clarify.  If you requested that your care partner not be given the details of your procedure findings, then the procedure report has been included in a sealed envelope for you to review at your convenience later.  YOU SHOULD EXPECT: Some feelings of bloating in the abdomen. Passage of more gas than usual.  Walking can help get rid of the air that was put into your GI tract during the procedure and reduce the bloating. If you had a lower endoscopy (such as a colonoscopy or flexible sigmoidoscopy) you may notice spotting of blood in your stool or on the toilet paper. If you underwent a bowel prep for your procedure, you may not have a normal bowel movement for a few days.  Please Note:  You might notice some irritation and congestion in your nose or some drainage.  This is from the oxygen used during your procedure.  There is no need for concern and it should clear up in a day or so.  SYMPTOMS TO REPORT IMMEDIATELY:   Following lower endoscopy (colonoscopy or flexible sigmoidoscopy):  Excessive amounts of blood in the stool  Significant tenderness or worsening of abdominal pains  Swelling of the abdomen that is new, acute  Fever of 100F or higher  For urgent or emergent issues, a gastroenterologist can be reached at any hour by calling [phone].   DIET:  We do recommend a small meal at first, but then you may proceed to your regular diet.  Drink plenty of fluids but you should avoid alcoholic beverages for 24 hours.  ACTIVITY:  You should plan to take it easy for the rest of today and you should NOT DRIVE or use heavy machinery until tomorrow (because of the sedation medicines used during the test).     FOLLOW UP: Our staff will call the number listed on your records the next business day following your procedure to check on you and address any questions or concerns that you may have regarding the information given to you following your procedure. If we do not reach you, we will leave a message.  However, if you are feeling well and you are not experiencing any problems, there is no need to return our call.  We will assume that you have returned to your regular daily activities without incident.  If any biopsies were taken you will be contacted by phone or by letter within the next 1-3 weeks.  Please call us at [phone] if you have not heard about the biopsies in 3 weeks.    SIGNATURES/CONFIDENTIALITY: You and/or your care partner have signed paperwork which will be entered into your electronic medical record.  These signatures attest to the fact that that the information above on your After Visit Summary has been reviewed and is understood.  Full responsibility of the confidentiality of this discharge information lies with you and/or your care-partner.  Thank you for letting us take care of our healthcare needs today.

## 2016-11-25 ENCOUNTER — Telehealth: Payer: Self-pay

## 2016-11-25 NOTE — Telephone Encounter (Signed)
  Follow up Call-  Call back number 11/24/2016  Post procedure Call Back phone  # [phone]  Permission to leave phone message Yes  Some recent data might be hidden     Patient questions:  Do you have a fever, pain , or abdominal swelling? No. Pain Score  0 *  Have you tolerated food without any problems? Yes.    Have you been able to return to your normal activities? Yes.    Do you have any questions about your discharge instructions: Diet   No. Medications  No. Follow up visit  No.  Do you have questions or concerns about your Care? No.  Actions: * If pain score is 4 or above: No action needed, pain <4.

## 2016-12-15 ENCOUNTER — Encounter: Payer: Self-pay | Admitting: Internal Medicine

## 2016-12-15 LAB — HM COLONOSCOPY

## 2016-12-16 ENCOUNTER — Other Ambulatory Visit: Payer: Self-pay | Admitting: Internal Medicine

## 2016-12-16 DIAGNOSIS — E785 Hyperlipidemia, unspecified: Secondary | ICD-10-CM

## 2016-12-16 DIAGNOSIS — I10 Essential (primary) hypertension: Secondary | ICD-10-CM

## 2016-12-16 MED ORDER — HYDROCHLOROTHIAZIDE 12.5 MG PO CAPS: 12.5000 mg | ORAL_CAPSULE | Freq: Every day | ORAL | 1 refills | Status: DC

## 2016-12-16 MED ORDER — SIMVASTATIN 40 MG PO TABS: 40.0000 mg | ORAL_TABLET | Freq: Every evening | ORAL | 1 refills | Status: DC

## 2016-12-17 ENCOUNTER — Other Ambulatory Visit: Payer: Self-pay | Admitting: Internal Medicine

## 2016-12-17 NOTE — Telephone Encounter (Signed)
Faxed script to fax # given above...Johny Chess

## 2017-01-18 ENCOUNTER — Ambulatory Visit (INDEPENDENT_AMBULATORY_CARE_PROVIDER_SITE_OTHER): Payer: PRIVATE HEALTH INSURANCE | Admitting: Internal Medicine

## 2017-01-18 ENCOUNTER — Encounter: Payer: Self-pay | Admitting: Internal Medicine

## 2017-01-18 ENCOUNTER — Other Ambulatory Visit (INDEPENDENT_AMBULATORY_CARE_PROVIDER_SITE_OTHER): Payer: PRIVATE HEALTH INSURANCE

## 2017-01-18 VITALS — BP 136/80 | HR 102 | Temp 98.7°F | Resp 20 | Wt 177.8 lb

## 2017-01-18 DIAGNOSIS — I10 Essential (primary) hypertension: Secondary | ICD-10-CM

## 2017-01-18 DIAGNOSIS — F900 Attention-deficit hyperactivity disorder, predominantly inattentive type: Secondary | ICD-10-CM | POA: Diagnosis not present

## 2017-01-18 LAB — BASIC METABOLIC PANEL
BUN: 15 mg/dL (ref 6–23)
CHLORIDE: 105 meq/L (ref 96–112)
CO2: 26 meq/L (ref 19–32)
Calcium: 9 mg/dL (ref 8.4–10.5)
Creatinine, Ser: 1.04 mg/dL (ref 0.40–1.50)
GFR: 75.98 mL/min (ref >60.00)
GLUCOSE: 134 mg/dL — AB (ref 70–99)
POTASSIUM: 3.6 meq/L (ref 3.5–5.1)
SODIUM: 139 meq/L (ref 135–145)

## 2017-01-18 MED ORDER — METHYLPHENIDATE HCL ER (XR) 40 MG PO CP24: 40.0000 mg | ORAL_CAPSULE | ORAL | 0 refills | Status: DC

## 2017-01-18 NOTE — Patient Instructions (Signed)
Hypertension Hypertension, commonly called high blood pressure, is when the force of blood pumping through your arteries is too strong. Your arteries are the blood vessels that carry blood from your heart throughout your body. A blood pressure reading consists of a higher number over a lower number, such as 110/72. The higher number (systolic) is the pressure inside your arteries when your heart pumps. The lower number (diastolic) is the pressure inside your arteries when your heart relaxes. Ideally you want your blood pressure below 120/80. Hypertension forces your heart to work harder to pump blood. Your arteries may become narrow or stiff. Having untreated or uncontrolled hypertension can cause heart attack, stroke, kidney disease, and other problems. What increases the risk? Some risk factors for high blood pressure are controllable. Others are not. Risk factors you cannot control include:  Race. You may be at higher risk if you are African American.  Age. Risk increases with age.  Gender. Men are at higher risk than women before age 45 years. After age 65, women are at higher risk than men. Risk factors you can control include:  Not getting enough exercise or physical activity.  Being overweight.  Getting too much fat, sugar, calories, or salt in your diet.  Drinking too much alcohol. What are the signs or symptoms? Hypertension does not usually cause signs or symptoms. Extremely high blood pressure (hypertensive crisis) may cause headache, anxiety, shortness of breath, and nosebleed. How is this diagnosed? To check if you have hypertension, your health care provider will measure your blood pressure while you are seated, with your arm held at the level of your heart. It should be measured at least twice using the same arm. Certain conditions can cause a difference in blood pressure between your right and left arms. A blood pressure reading that is higher than normal on one occasion does  not mean that you need treatment. If it is not clear whether you have high blood pressure, you may be asked to return on a different day to have your blood pressure checked again. Or, you may be asked to monitor your blood pressure at home for 1 or more weeks. How is this treated? Treating high blood pressure includes making lifestyle changes and possibly taking medicine. Living a healthy lifestyle can help lower high blood pressure. You may need to change some of your habits. Lifestyle changes may include:  Following the DASH diet. This diet is high in fruits, vegetables, and whole grains. It is low in salt, red meat, and added sugars.  Keep your sodium intake below 2,300 mg per day.  Getting at least 30-45 minutes of aerobic exercise at least 4 times per week.  Losing weight if necessary.  Not smoking.  Limiting alcoholic beverages.  Learning ways to reduce stress. Your health care provider may prescribe medicine if lifestyle changes are not enough to get your blood pressure under control, and if one of the following is true:  You are 18-59 years of age and your systolic blood pressure is above 140.  You are 60 years of age or older, and your systolic blood pressure is above 150.  Your diastolic blood pressure is above 90.  You have diabetes, and your systolic blood pressure is over 140 or your diastolic blood pressure is over 90.  You have kidney disease and your blood pressure is above 140/90.  You have heart disease and your blood pressure is above 140/90. Your personal target blood pressure may vary depending on your medical   conditions, your age, and other factors. Follow these instructions at home:  Have your blood pressure rechecked as directed by your health care provider.  Take medicines only as directed by your health care provider. Follow the directions carefully. Blood pressure medicines must be taken as prescribed. The medicine does not work as well when you skip  doses. Skipping doses also puts you at risk for problems.  Do not smoke.  Monitor your blood pressure at home as directed by your health care provider. Contact a health care provider if:  You think you are having a reaction to medicines taken.  You have recurrent headaches or feel dizzy.  You have swelling in your ankles.  You have trouble with your vision. Get help right away if:  You develop a severe headache or confusion.  You have unusual weakness, numbness, or feel faint.  You have severe chest or abdominal pain.  You vomit repeatedly.  You have trouble breathing. This information is not intended to replace advice given to you by your health care provider. Make sure you discuss any questions you have with your health care provider. Document Released: 11/23/2005 Document Revised: 04/30/2016 Document Reviewed: 09/15/2013 Elsevier Interactive Patient Education  2017 [employer]  

## 2017-01-18 NOTE — Progress Notes (Signed)
Pre visit review using our clinic review tool, if applicable. No additional management support is needed unless otherwise documented below in the visit note. 

## 2017-01-18 NOTE — Progress Notes (Signed)
Subjective:  Patient ID: Robert Mccall, male    DOB: 03-21-1951  Age: 66 y.o. MRN: [id number]  CC: Hypertension   HPI Robert Mccall presents for a BP check. He tells me that his BP has been well controlled.  Pt states ADD status overall stable on current meds with overall good compliance and tolerability, and good effectiveness with respect to ability for concentration and task completion.  Outpatient Medications Prior to Visit  Medication Sig Dispense Refill  . alfuzosin (UROXATRAL) 10 MG 24 hr tablet TAKE 1 TABLET(10 MG) BY MOUTH DAILY WITH BREAKFAST 90 tablet 1  . aspirin 81 MG tablet Take 81 mg by mouth daily.      . bimatoprost (LUMIGAN) 0.03 % ophthalmic solution INT 1 GTT INTO OD HS  0  . Cholecalciferol (VITAMIN D) 2000 UNITS CAPS Take 1 capsule (2,000 Units total) by mouth 1 dose over 46 hours. 30 capsule 11  . Cyanocobalamin (B-12) 2000 MCG TABS Take 2,000 mcg by mouth.    . DULoxetine (CYMBALTA) 30 MG capsule TAKE 1 CAPSULE BY MOUTH EVERY DAY 30 capsule 11  . esomeprazole (NEXIUM) 40 MG capsule TAKE 1 CAPSULE(40 MG) BY MOUTH DAILY 90 capsule 2  . finasteride (PROSCAR) 5 MG tablet Take 1 tablet (5 mg total) by mouth daily. 90 tablet 1  . gabapentin (NEURONTIN) 300 MG capsule TAKE 1 CAPSULE(300 MG) BY MOUTH THREE TIMES DAILY 270 capsule 1  . hydrochlorothiazide (MICROZIDE) 12.5 MG capsule Take 1 capsule (12.5 mg total) by mouth daily. 90 capsule 1  . KRILL OIL 1000 MG CAPS Take 1 capsule by mouth daily.      Marland Kitchen lamoTRIgine (LAMICTAL) 25 MG tablet TAKE 3 TABLETS BY MOUTH TWICE DAILY 540 tablet 1  . meclizine (ANTIVERT) 12.5 MG tablet Take 1 tablet (12.5 mg total) by mouth 3 (three) times daily as needed for dizziness. 30 tablet 0  . niacin 500 MG tablet Take 500 mg by mouth daily with breakfast.      . simvastatin (ZOCOR) 40 MG tablet Take 1 tablet (40 mg total) by mouth every evening. 90 tablet 1  . verapamil (CALAN-SR) 240 MG CR tablet TAKE 1 TABLET(240 MG) BY MOUTH DAILY 90  tablet 1  . Methylphenidate HCl ER, XR, 40 MG CP24 Take 40 mg by mouth every morning. 30 capsule 0  . 0.9 %  sodium chloride infusion      No facility-administered medications prior to visit.     ROS Review of Systems  Constitutional: Negative for activity change, appetite change, diaphoresis, fatigue and unexpected weight change.  HENT: Negative for trouble swallowing.   Eyes: Negative for visual disturbance.  Respiratory: Negative for cough, chest tightness, shortness of breath, wheezing and stridor.   Cardiovascular: Negative for chest pain, palpitations and leg swelling.  Gastrointestinal: Negative for abdominal pain, constipation, diarrhea, nausea and vomiting.  Endocrine: Negative.   Genitourinary: Negative.  Negative for difficulty urinating.  Musculoskeletal: Negative.  Negative for back pain and myalgias.  Skin: Negative.   Allergic/Immunologic: Negative.   Neurological: Negative.  Negative for dizziness.  Hematological: Negative.  Negative for adenopathy. Does not bruise/bleed easily.  Psychiatric/Behavioral: Positive for decreased concentration. Negative for agitation, behavioral problems, dysphoric mood, sleep disturbance and suicidal ideas. The patient is not nervous/anxious.     Objective:  BP 136/80   Pulse (!) 102   Temp 98.7 F (37.1 C) (Oral)   Resp 20   Wt 177 lb 12 oz (80.6 kg)   SpO2 95%  BMI 30.99 kg/m   BP Readings from Last 3 Encounters:  01/18/17 136/80  11/24/16 119/76  11/17/16 (!) 142/80    Wt Readings from Last 3 Encounters:  01/18/17 177 lb 12 oz (80.6 kg)  11/24/16 174 lb (78.9 kg)  11/17/16 169 lb (76.7 kg)    Physical Exam  Constitutional: He is oriented to person, place, and time. No distress.  HENT:  Mouth/Throat: Oropharynx is clear and moist. No oropharyngeal exudate.  Eyes: Conjunctivae are normal. Right eye exhibits no discharge. Left eye exhibits no discharge. No scleral icterus.  Neck: Normal range of motion. Neck supple.  No JVD present. No tracheal deviation present. No thyromegaly present.  Cardiovascular: Normal rate, regular rhythm, normal heart sounds and intact distal pulses.  Exam reveals no gallop and no friction rub.   No murmur heard. Pulmonary/Chest: Effort normal and breath sounds normal. No stridor. No respiratory distress. He has no wheezes. He has no rales. He exhibits no tenderness.  Abdominal: Soft. Bowel sounds are normal. He exhibits no distension and no mass. There is no tenderness. There is no rebound and no guarding.  Musculoskeletal: Normal range of motion. He exhibits no edema, tenderness or deformity.  Lymphadenopathy:    He has no cervical adenopathy.  Neurological: He is oriented to person, place, and time.  Skin: Skin is warm and dry. No rash noted. He is not diaphoretic. No erythema. No pallor.  Psychiatric: He has a normal mood and affect. His behavior is normal. Judgment and thought content normal.  Vitals reviewed.   Lab Results  Component Value Date   WBC 6.8 08/18/2016   HGB 14.9 08/18/2016   HCT 44.1 08/18/2016   PLT 251 08/18/2016   GLUCOSE 134 (H) 01/18/2017   CHOL 126 08/24/2016   TRIG 173 (H) 08/24/2016   HDL 41 08/24/2016   LDLCALC 50 08/24/2016   ALT 24 08/18/2016   AST 21 08/18/2016   NA 139 01/18/2017   K 3.6 01/18/2017   CL 105 01/18/2017   CREATININE 1.04 01/18/2017   BUN 15 01/18/2017   CO2 26 01/18/2017   TSH 1.27 02/03/2016   PSA 0.38 02/03/2016   HGBA1C 5.5 08/24/2016    Mr Lumbar Spine Wo Contrast  Result Date: 09/03/2016 CLINICAL DATA:  66 year old male with lumbar back pain, bilateral thigh pain. Chronic but progressive symptoms. No known injury. Initial encounter. EXAM: MRI LUMBAR SPINE WITHOUT CONTRAST TECHNIQUE: Multiplanar, multisequence MR imaging of the lumbar spine was performed. No intravenous contrast was administered. COMPARISON:  Metastatic skeletal bone survey 08/18/2016. Lumbar MRI 10/12/2013. FINDINGS: Segmentation: Normal lumbar  segmentation which is the same numbering system used on the 2014 MRI. Alignment: Chronic mild retrolisthesis of L2 on L3 and L3 on L4, not significantly changed. Overall stable vertebral height and alignment since 2014. Vertebrae: Chronic degenerative endplate changes in the lumbar spine, maximal at L2-L3 and L3-L4. Trace associated degenerative appearing endplate marrow edema at those levels. Mild degenerative appearing posterior endplate marrow edema inferiorly at L1. No acute osseous abnormality identified. Conus medullaris: Extends to the L1-L2 level and appears normal. Paraspinal and other soft tissues: Stable and negative. Disc levels: T10-T11: Disc desiccation and disc space loss. Broad-based posterior disc protrusion. Borderline to mild spinal stenosis. Mild facet hypertrophy. Mild bilateral T10 foraminal stenosis. T11-12: Disc desiccation. Minimal disc bulge. Mild facet hypertrophy. No stenosis. T12-L1: Chronic mild left eccentric circumferential disc bulge. Slightly increased broad-based posterior component of disc. Mild facet hypertrophy. Borderline to mild spinal stenosis is increased. No foraminal  stenosis. L1-L2: Chronic circumferential disc bulge with broad-based posterior component. Stable mild facet hypertrophy. Slight increased facet joint fluid on the right. Borderline to mild spinal stenosis and left lateral recess stenosis have not significantly changed. L2-L3: Chronic severe disc space loss. Bulky right eccentric circumferential disc osteophyte complex. Mild facet hypertrophy appears stable. Chronic moderate to severe right lateral recess stenosis. Mild spinal stenosis. Mild left and moderate right L2 foraminal stenosis. This level appears stable. L3-L4: Chronic severe disc space loss. Bulky circumferential disc osteophyte complex with broad-based posterior component. Moderate facet hypertrophy. Epidural lipomatosis. Moderate to severe spinal stenosis (series 4, image 23). Right greater than  left associated lateral recess stenosis. Moderate to severe bilateral L3 foraminal stenosis. This level is stable. L4-L5: Chronic left eccentric circumferential disc bulge with broad-based posterior component. Chronic moderate facet and ligament flavum hypertrophy. New caudal disc extrusion in the midline (series 4, image 30 and series 2, image 7) with sequestered disc fragment posterior to the L5 vertebral body measuring 6-7 mm in diameter and 13-14 mm in length. Severe involvement of the descending left L5 nerve roots (series 4, image 31). Severe spinal and left lateral recess stenosis at the disc space level has mildly progressed. Moderate to severe bilateral foraminal stenosis has not significantly changed. L5-S1: Left eccentric circumferential disc osteophyte complex. Moderate facet hypertrophy. Mild to moderate chronic left lateral recess stenosis is stable. Severe left L5 foraminal stenosis is stable. IMPRESSION: 1. Symptomatic level might be L4-L5 where a new caudal disc extrusion with sequestered disc fragment is superimposed on fairly advanced chronic disc, endplate, and facet degeneration. Severe spinal and moderate to severe biforaminal stenosis at that level has not significantly changed but there is increased impingement of the descending left L5 nerve roots. 2. Chronic moderate to severe spinal and foraminal stenosis at L3-L4 is stable. Chronic L2-L3 moderate to severe right lateral recess and mild spinal stenosis is stable. Chronic up to moderate left lateral recess and severe left foraminal stenosis at L5-S1 is stable. 3. Increased borderline to mild lower thoracic spinal stenosis at T10-T11 and T12-L1. Electronically Signed   By: Genevie Ann M.D.   On: 09/03/2016 11:05    Assessment & Plan:   Mykle was seen today for hypertension.  Diagnoses and all orders for this visit:  Attention deficit hyperactivity disorder (ADHD), predominantly inattentive type -     Discontinue: Methylphenidate HCl ER,  XR, 40 MG CP24; Take 40 mg by mouth every morning. -     Discontinue: Methylphenidate HCl ER, XR, 40 MG CP24; Take 40 mg by mouth every morning. -     Discontinue: Methylphenidate HCl ER, XR, 40 MG CP24; Take 40 mg by mouth every morning. -     Methylphenidate HCl ER, XR, 40 MG CP24; Take 40 mg by mouth every morning.  Essential hypertension- his blood pressure is well-controlled, electrolytes and renal function are normal. -     Basic metabolic panel; Future   I am having Robert Mccall maintain his aspirin, Krill Oil, niacin, Vitamin D, finasteride, meclizine, bimatoprost, B-12, esomeprazole, DULoxetine, lamoTRIgine, hydrochlorothiazide, simvastatin, gabapentin, alfuzosin, verapamil, and Methylphenidate HCl ER (XR). We will stop administering sodium chloride.  Meds ordered this encounter  Medications  . DISCONTD: Methylphenidate HCl ER, XR, 40 MG CP24    Sig: Take 40 mg by mouth every morning.    Dispense:  30 capsule    Refill:  0    May fill once every 30 days. Feb  2018  . DISCONTD: Methylphenidate HCl  ER, XR, 40 MG CP24    Sig: Take 40 mg by mouth every morning.    Dispense:  30 capsule    Refill:  0    May fill once every 30 days. March  2018  . DISCONTD: Methylphenidate HCl ER, XR, 40 MG CP24    Sig: Take 40 mg by mouth every morning.    Dispense:  30 capsule    Refill:  0    May fill once every 30 days. April  2018  . Methylphenidate HCl ER, XR, 40 MG CP24    Sig: Take 40 mg by mouth every morning.    Dispense:  30 capsule    Refill:  0    May fill once every 30 days. May  2018     Follow-up: Return in about 6 months (around 07/18/2017).  Scarlette Calico, MD

## 2017-01-20 ENCOUNTER — Encounter: Payer: Self-pay | Admitting: Internal Medicine

## 2017-01-20 NOTE — Telephone Encounter (Signed)
Do you know who in the mychart world would need this information.

## 2017-01-23 ENCOUNTER — Emergency Department (HOSPITAL_COMMUNITY)
Admission: EM | Admit: 2017-01-23 | Discharge: 2017-01-23 | Disposition: A | Discharge status: Home / Self Care | Payer: No Typology Code available for payment source | Attending: Emergency Medicine | Admitting: Emergency Medicine

## 2017-01-23 ENCOUNTER — Emergency Department (HOSPITAL_COMMUNITY): Payer: No Typology Code available for payment source

## 2017-01-23 ENCOUNTER — Encounter (HOSPITAL_COMMUNITY): Payer: Self-pay

## 2017-01-23 DIAGNOSIS — F909 Attention-deficit hyperactivity disorder, unspecified type: Secondary | ICD-10-CM | POA: Diagnosis not present

## 2017-01-23 DIAGNOSIS — Z7982 Long term (current) use of aspirin: Secondary | ICD-10-CM | POA: Diagnosis not present

## 2017-01-23 DIAGNOSIS — R410 Disorientation, unspecified: Secondary | ICD-10-CM | POA: Diagnosis present

## 2017-01-23 DIAGNOSIS — Z79899 Other long term (current) drug therapy: Secondary | ICD-10-CM | POA: Insufficient documentation

## 2017-01-23 DIAGNOSIS — I10 Essential (primary) hypertension: Secondary | ICD-10-CM | POA: Diagnosis not present

## 2017-01-23 DIAGNOSIS — R412 Retrograde amnesia: Secondary | ICD-10-CM

## 2017-01-23 DIAGNOSIS — R413 Other amnesia: Secondary | ICD-10-CM | POA: Diagnosis not present

## 2017-01-23 DIAGNOSIS — R791 Abnormal coagulation profile: Secondary | ICD-10-CM | POA: Diagnosis not present

## 2017-01-23 LAB — COMPREHENSIVE METABOLIC PANEL
ALT: 29 U/L (ref 17–63)
AST: 31 U/L (ref 15–41)
Albumin: 4.1 g/dL (ref 3.5–5.0)
Alkaline Phosphatase: 55 U/L (ref 38–126)
Anion gap: 7 (ref 5–15)
BILIRUBIN TOTAL: 0.8 mg/dL (ref 0.3–1.2)
BUN: 13 mg/dL (ref 6–20)
CO2: 26 mmol/L (ref 22–32)
CREATININE: 1.02 mg/dL (ref 0.61–1.24)
Calcium: 9 mg/dL (ref 8.9–10.3)
Chloride: 107 mmol/L (ref 101–111)
GFR calc Af Amer: >60 mL/min (ref >60)
Glucose, Bld: 116 mg/dL — ABNORMAL HIGH (ref 65–99)
Potassium: 3.6 mmol/L (ref 3.5–5.1)
Sodium: 140 mmol/L (ref 135–145)
TOTAL PROTEIN: 7 g/dL (ref 6.5–8.1)

## 2017-01-23 LAB — RAPID URINE DRUG SCREEN, HOSP PERFORMED
Amphetamines: NOT DETECTED
BARBITURATES: NOT DETECTED
BENZODIAZEPINES: NOT DETECTED
COCAINE: NOT DETECTED
Opiates: NOT DETECTED
TETRAHYDROCANNABINOL: NOT DETECTED

## 2017-01-23 LAB — URINALYSIS, ROUTINE W REFLEX MICROSCOPIC
BACTERIA UA: NONE SEEN
Bilirubin Urine: NEGATIVE
Glucose, UA: NEGATIVE mg/dL
Hgb urine dipstick: NEGATIVE
Ketones, ur: NEGATIVE mg/dL
Nitrite: NEGATIVE
PH: 6 (ref 5.0–8.0)
Protein, ur: NEGATIVE mg/dL
SPECIFIC GRAVITY, URINE: 1.017 (ref 1.005–1.030)

## 2017-01-23 LAB — DIFFERENTIAL
BASOS ABS: 0 10*3/uL (ref 0.0–0.1)
Basophils Relative: 0 %
EOS ABS: 0.1 10*3/uL (ref 0.0–0.7)
Eosinophils Relative: 1 %
LYMPHS ABS: 2.5 10*3/uL (ref 0.7–4.0)
Lymphocytes Relative: 33 %
MONOS PCT: 9 %
Monocytes Absolute: 0.6 10*3/uL (ref 0.1–1.0)
NEUTROS ABS: 4.2 10*3/uL (ref 1.7–7.7)
Neutrophils Relative %: 57 %

## 2017-01-23 LAB — APTT: APTT: 32 s (ref 24–36)

## 2017-01-23 LAB — CBC
HEMATOCRIT: 41.9 % (ref 39.0–52.0)
HEMOGLOBIN: 14.7 g/dL (ref 13.0–17.0)
MCH: 29.7 pg (ref 26.0–34.0)
MCHC: 35.1 g/dL (ref 30.0–36.0)
MCV: 84.6 fL (ref 78.0–100.0)
Platelets: 253 10*3/uL (ref 150–400)
RBC: 4.95 MIL/uL (ref 4.22–5.81)
RDW: 13.1 % (ref 11.5–15.5)
WBC: 7.4 10*3/uL (ref 4.0–10.5)

## 2017-01-23 LAB — I-STAT VENOUS BLOOD GAS, ED
ACID-BASE DEFICIT: 1 mmol/L (ref 0.0–2.0)
Bicarbonate: 23 mmol/L (ref 20.0–28.0)
O2 SAT: 64 %
PO2 VEN: 33 mmHg (ref 32.0–45.0)
TCO2: 24 mmol/L (ref 0–100)
pCO2, Ven: 36.6 mmHg — ABNORMAL LOW (ref 44.0–60.0)
pH, Ven: 7.406 (ref 7.250–7.430)

## 2017-01-23 LAB — I-STAT TROPONIN, ED: TROPONIN I, POC: 0 ng/mL (ref 0.00–0.08)

## 2017-01-23 LAB — I-STAT CHEM 8, ED
BUN: 15 mg/dL (ref 6–20)
CREATININE: 1 mg/dL (ref 0.61–1.24)
Calcium, Ion: 1.15 mmol/L (ref 1.15–1.40)
Chloride: 105 mmol/L (ref 101–111)
GLUCOSE: 112 mg/dL — AB (ref 65–99)
HCT: 41 % (ref 39.0–52.0)
HEMOGLOBIN: 13.9 g/dL (ref 13.0–17.0)
POTASSIUM: 3.7 mmol/L (ref 3.5–5.1)
Sodium: 140 mmol/L (ref 135–145)
TCO2: 25 mmol/L (ref 0–100)

## 2017-01-23 LAB — PROTIME-INR
INR: 1.04
Prothrombin Time: 13.6 seconds (ref 11.4–15.2)

## 2017-01-23 LAB — ETHANOL: Alcohol, Ethyl (B): <5 mg/dL (ref <5)

## 2017-01-23 MED ORDER — LORAZEPAM 0.5 MG PO TABS
0.5000 mg | ORAL_TABLET | Freq: Once | ORAL | Status: AC
  Administered 2017-01-23: 0.5 mg via ORAL
  Filled 2017-01-23: qty 1

## 2017-01-23 NOTE — ED Provider Notes (Signed)
[facility] DEPT Provider Note   CSN: [id number] Arrival date & time: 01/23/17  1213     History   Chief Complaint Chief Complaint  Patient presents with  . Altered Mental Status   HPI Robert Mccall is a 66 y.o. male.  HPI Robert Mccall is a 66 yo male with history of Hypertension, depression and attention deficit disorder who presents with confusion this morning.  Patient is here with his roommate who contributed to history.  Patient was in his usual state of health until last night. Roommate went for shopping this morning. When he returned home about 11:30 am, he noted that patient was confused and couldn't remember what he ate for his breakfast. He didn't see him this morning before he went out for shopping. He denies facial drooping and slurred speech but his roommate thinks his speech is off. He denies focal weakness or numbness. He denies history of recent illness or recent medication changes. He denies headache, vision changes, chest pain, palpitation, shortness of breath, nausea, vomiting, diarrhea or dysuria. He doesn't remember taking any of his medication today either. He has no history of stroke, liver disease or heart attack in the past.   Past Medical History:  Diagnosis Date  . ADHD (attention deficit hyperactivity disorder)   . Anxiety   . Benign prostatic hypertrophy   . Chronic lower back pain    surgery schedule for back 10/21/16  . Cyst of right kidney    incidental [place] 1.3cm on R (MRI abd 09/2014), follows with uro  . Depression   . GERD (gastroesophageal reflux disease)   . Glaucoma   . History of migraine headaches    no meds  . Hyperlipidemia   . Hypertension     Patient Active Problem List   Diagnosis Date Noted  . History of colonic polyps 09/16/2016  . MGUS (monoclonal gammopathy of unknown significance) 06/11/2016  . 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 Coarse tremors 04/09/2016   . Routine general medical examination at a health care facility 02/03/2016  . Restless legs syndrome with nocturnal myoclonus 10/02/2015  . Chronic pain 05/07/2015  . Acid reflux 05/07/2015  . Kidney cysts 05/07/2015  . Prediabetes 11/14/2014  . HEADACHE, CLUSTER 02/17/2010  . OSTEOPENIA 09/24/2009  . DUPUYTREN'S CONTRACTURE, RIGHT 09/06/2008  . DEGENERATION, LUMBAR/LUMBOSACRAL DISC 07/15/2007  . Depression, controlled 06/14/2007  . Attention deficit disorder 06/14/2007  . Hyperlipidemia with target LDL less than 130 05/13/2007  . Essential hypertension 05/13/2007  . BENIGN PROSTATIC HYPERTROPHY 05/13/2007    Past Surgical History:  Procedure Laterality Date  . COLONOSCOPY  09/2007   polyps/ [facility] [place]  . HYDROCELE EXCISION Right 1990  . LUMBAR DISC SURGERY  10/21/2016  . ORCHIECTOMY Left 1970  . WISDOM TOOTH EXTRACTION      Home Medications    Prior to Admission medications   Medication Sig Start Date End Date Taking? Authorizing Provider  alfuzosin (UROXATRAL) 10 MG 24 hr tablet TAKE 1 TABLET(10 MG) BY MOUTH DAILY WITH BREAKFAST 12/17/16  Yes [name], MD  aspirin 81 MG tablet Take 81 mg by mouth daily.     Yes Historical Provider, MD  DULoxetine (CYMBALTA) 30 MG capsule TAKE 1 CAPSULE BY MOUTH EVERY DAY 11/19/16  Yes [name], MD  esomeprazole (NEXIUM) 40 MG capsule TAKE 1 CAPSULE(40 MG) BY MOUTH DAILY 08/20/16  Yes [name], MD  finasteride (PROSCAR) 5 MG tablet Take 1 tablet (5 mg total)  by mouth daily. 01/27/16  Yes [name], MD  gabapentin (NEURONTIN) 300 MG capsule TAKE 1 CAPSULE(300 MG) BY MOUTH THREE TIMES DAILY 12/17/16  Yes [name], MD  hydrochlorothiazide (MICROZIDE) 12.5 MG capsule Take 1 capsule (12.5 mg total) by mouth daily. 12/16/16  Yes [name], MD  KRILL OIL 1000 MG CAPS Take 1,000 mg by mouth daily.    Yes Historical Provider, MD  lamoTRIgine (LAMICTAL) 25 MG tablet TAKE 3 TABLETS BY MOUTH TWICE DAILY 11/24/16  Yes [name], MD  Methylphenidate HCl ER, XR, 40 MG CP24 Take 40 mg by mouth every morning. 01/18/17  Yes [name], MD  verapamil (CALAN-SR) 240 MG CR tablet TAKE 1 TABLET(240 MG) BY MOUTH DAILY 12/17/16  Yes [name], MD  Cholecalciferol (VITAMIN D) 2000 UNITS CAPS Take 1 capsule (2,000 Units total) by mouth 1 dose over 46 hours. Patient not taking: Reported on 01/23/2017 02/04/11   Ricard Dillon, MD  meclizine (ANTIVERT) 12.5 MG tablet Take 1 tablet (12.5 mg total) by mouth 3 (three) times daily as needed for dizziness. 04/09/16   [name], MD  simvastatin (ZOCOR) 40 MG tablet Take 1 tablet (40 mg total) by mouth every evening. Patient not taking: Reported on 01/23/2017 12/16/16   [name], MD    Family History Family History  Problem Relation Age of Onset  . COPD Mother   . Heart disease Mother   . Breast cancer Mother   . Osteoarthritis Mother     s/p B TKR  . Depression Mother   . Macular degeneration Father   . Diabetes Father   . Colon cancer Neg Hx   . Esophageal cancer Neg Hx   . Rectal cancer Neg Hx   . Stomach cancer Neg Hx     Social History Social History  Substance Use Topics  . Smoking status: Never Smoker  . Smokeless tobacco: Never Used  . Alcohol use Yes     Comment: 1-2 glasses wine per day     Allergies   Patient has no known allergies.   Review of Systems Review of Systems  Constitutional: Negative for chills and fever.  HENT: Negative for congestion, ear pain, rhinorrhea and sore throat.   Eyes: Negative for pain and visual disturbance.  Respiratory: Negative for cough, chest tightness and shortness of breath.   Cardiovascular: Negative for chest pain and palpitations.  Gastrointestinal: Negative for abdominal pain, diarrhea, nausea and vomiting.  Genitourinary: Negative for dysuria, hematuria and urgency.  Musculoskeletal: Negative for arthralgias and back pain.  Skin: Negative for color change and rash.  Neurological: Negative for  dizziness, seizures, syncope and light-headedness.  All other systems reviewed and are negative.  Physical Exam Updated Vital Signs BP 148/94   Pulse 106   Temp 98.3 F (36.8 C) (Oral)   Resp 23   Ht 5\' 4"  (1.626 m)   Wt 79.4 kg   SpO2 97%   BMI 30.04 kg/m   Physical Exam GEN: appears anxious, no apparent distress. Head: normocephalic and atraumatic  Eyes: conjunctiva without injection, sclera anicteric Ears: external ear and ear canal normal Nares: no rhinorrhea, congestion or erythema Oropharynx: mmm without erythema or exudation HEM: negative for cervical or periauricular lymphadenopathies CVS: tachycardic, RR, nl s1 & s2, no murmurs, no edema,  cap refills < 2 secs  RESP: speaks in full sentence, no IWOB, CTAB GI: BS present & normal, soft, NTND, no guarding, no rebound, no  mass GU: no suprapubic or CVA tenderness MSK: no focal tenderness or notable swelling SKIN: no apparent skin lesion ENDO: negative thyromegally NEURO: awake, alert, oriented to self, place, person but not to date, month or year. He thinks the month is Jan and the year is 2020. CN-II-XII intact, motor 5/5, light sensation intact, no pronator drift, finger to nose normal, biceps and patellar reflex 2+ bilaterally.  PSYCH: appears anxious  ED Treatments / Results  Labs (all labs ordered are listed, but only abnormal results are displayed) Labs Reviewed  COMPREHENSIVE METABOLIC PANEL - Abnormal; Notable for the following:       Result Value   Glucose, Bld 116 (*)    All other components within normal limits  I-STAT CHEM 8, ED - Abnormal; Notable for the following:    Glucose, Bld 112 (*)    All other components within normal limits  I-STAT VENOUS BLOOD GAS, ED - Abnormal; Notable for the following:    pCO2, Ven 36.6 (*)    All other components within normal limits  PROTIME-INR  APTT  CBC  DIFFERENTIAL  ETHANOL  URINALYSIS, ROUTINE W REFLEX MICROSCOPIC  RAPID URINE DRUG SCREEN, HOSP PERFORMED   BLOOD GAS, VENOUS  LAMOTRIGINE LEVEL  I-STAT TROPOININ, ED  CBG MONITORING, ED    EKG  EKG Interpretation  Date/Time:  Saturday January 23 2017 12:30:18 EST Ventricular Rate:  111 PR Interval:  184 QRS Duration: 74 QT Interval:  338 QTC Calculation: 459 R Axis:   3 Text Interpretation:  Sinus tachycardia Possible Anterior infarct , age undetermined Abnormal ECG No old tracing to compare Confirmed by FLOYD MD, DANIEL [phone]) on 01/23/2017 12:35:15 PM       Radiology Dg Chest 2 View  Result Date: 01/23/2017 CLINICAL DATA:  Altered mental status, hypertension EXAM: CHEST  2 VIEW COMPARISON:  08/18/2016 FINDINGS: Normal heart size, mediastinal contours, and pulmonary vascularity. Lungs clear. No pleural effusion or pneumothorax. Osseous structures unremarkable. IMPRESSION: No acute abnormalities. Electronically Signed   By: Lavonia Dana M.D.   On: 01/23/2017 13:41   Ct Head Wo Contrast  Result Date: 01/23/2017 CLINICAL DATA:  Patient with confusion. EXAM: CT HEAD WITHOUT CONTRAST TECHNIQUE: Contiguous axial images were obtained from the base of the skull through the vertex without intravenous contrast. COMPARISON:  None. FINDINGS: Brain: Ventricles and sulci are appropriate for patient's age. No evidence for acute cortically based infarct, intracranial hemorrhage, mass lesion or mass-effect. Vascular: No hyperdense vessel or unexpected calcification. Skull: Normal. Negative for fracture or focal lesion. Sinuses/Orbits: Minimal opacification left mastoid air cells. Paranasal sinuses are well aerated. Other: None. IMPRESSION: No acute intracranial process. Electronically Signed   By: Lovey Newcomer M.D.   On: 01/23/2017 13:29    Procedures Procedures (including critical care time)  Medications Ordered in ED Medications - No data to display   Initial Impression / Assessment and Plan / ED Course  I have reviewed the triage vital signs and the nursing notes.  Pertinent labs & imaging  results that were available during my care of the patient were reviewed by me and considered in my medical decision making (see chart for details).  Patient with "confusion, disorientation and slurred speech" noted this morning. He was known to be well last night. Exam remarkable for some word finding problem. Now he appears to have short memory issue per his sister who is in the room. He is not oriented to time, which is unusual for him. He was also found to have 3-4  beats of clonus in his right foot when examined by my attending. He has history of nocturnal myoclonus and tremor at baseline and follows with neurology. Otherwise, neuro exam was within normal limit. Workup including CMP, troponin and CBC and CT head within normal limit. EKG with sinus tachycardia likely from missed dose of verapamil.   Talked to neurology on call, Dr. Orlena Sheldon, who saw the patient and ordered MRI Brain here in the ER. If normal, then do EEG as outpatient without need to admit.   Final Clinical Impressions(s) / ED Diagnoses   Final diagnoses:  None    New Prescriptions New Prescriptions   No medications on file     Mercy Riding, MD 01/23/17 [id number]    Mercy Riding, MD 01/25/17 [place], DO 01/27/17 1530

## 2017-01-23 NOTE — ED Notes (Signed)
Patient transported to MRI 

## 2017-01-23 NOTE — ED Provider Notes (Signed)
.   Please see previous physicians note regarding patient's presenting history and physical, initial ED course, and associated medical decision making.   66 year old male who presents with brief episode of confusion and short-term memory loss. Was seen by neurology who had recommended MRI. If MRI was normal, patient could be discharged home with outpatient follow-up with his neurologist for potential EEG.   MRI is visualized. Read by radiology as normal. He did have a panic attack while in the scanner, and received oral Ativan, with improvement of his symptoms. His prior symptoms of short-term memory loss and confusion have resolved. He is felt to be stable for discharge home. He does have a neurologist who he has seen before and he will establish close follow-up.  Strict return and follow-up instructions reviewed. He and family expressed understanding of all discharge instructions and felt comfortable with the plan of care.    Forde Dandy, MD 01/23/17 [phone]

## 2017-01-23 NOTE — ED Notes (Signed)
MD at bedside. 

## 2017-01-23 NOTE — ED Triage Notes (Signed)
Patient was found by renter this am as confused and back in the bed. Neighbor saw patient last night and he seemed well. denies pain but confused to month and confused if he cooked this am or what is happening. Steady gait, no facial droop, no slurred speech.

## 2017-01-23 NOTE — Discharge Instructions (Signed)
Please contact your neurologist for follow-up and to have an outpatient EEG study performed earlier symptoms today.  Return without fail for worsening symptoms, including recurrent confusion, or any other symptoms concerning to you.  Your MRI today did not show anything serious.

## 2017-01-23 NOTE — ED Notes (Signed)
Patient transported to X-ray 

## 2017-01-23 NOTE — ED Notes (Signed)
Informed RN-Wendy of venous blood gas

## 2017-01-23 NOTE — ED Notes (Signed)
MRI contacted RN about patient hyperventilating and that they were sending patient back.

## 2017-01-23 NOTE — Consult Note (Signed)
Reason for Consult: Amnesia/confusion Referring Physician: ER  Robert Mccall is an 66 y.o. male.  HPI: He is a rather poor historian.  His sister and friend try to fill in some gaps.  This morning, his roommate went out and when he came in he found him confused and disoriented.  He had some memory loss too and that has all improved after an hour in the ER.  He denies any prodrome to his confusion.  He had no weakness, numbness, or language disturbance.  He denies having taken any extra medication by error or having his medication doses or regimen changed in the last 3 months.  CBC, CMP, CXR, ECG normal.  CT Brain was reviewed personally and normal.    Past Medical History:  Diagnosis Date  . ADHD (attention deficit hyperactivity disorder)   . Anxiety   . Benign prostatic hypertrophy   . Chronic lower back pain    surgery schedule for back 10/21/16  . Cyst of right kidney    incidental [place] 1.3cm on R (MRI abd 09/2014), follows with uro  . Depression   . GERD (gastroesophageal reflux disease)   . Glaucoma   . History of migraine headaches    no meds  . Hyperlipidemia   . Hypertension     Past Surgical History:  Procedure Laterality Date  . COLONOSCOPY  09/2007   polyps/ Deatra In[place]  . HYDROCELE EXCISION Right 1990  . LUMBAR DISC SURGERY  10/21/2016  . ORCHIECTOMY Left 1970  . WISDOM TOOTH EXTRACTION      Family History  Problem Relation Age of Onset  . COPD Mother   . Heart disease Mother   . Breast cancer Mother   . Osteoarthritis Mother     s/p B TKR  . Depression Mother   . Macular degeneration Father   . Diabetes Father   . Colon cancer Neg Hx   . Esophageal cancer Neg Hx   . Rectal cancer Neg Hx   . Stomach cancer Neg Hx     Social History:  reports that he has never smoked. He has never used smokeless tobacco. He reports that he drinks alcohol. He reports that he does not use drugs.  Allergies: No Known Allergies  Prior to Admission medications   Medication  Sig Start Date End Date Taking? Authorizing Provider  alfuzosin (UROXATRAL) 10 MG 24 hr tablet TAKE 1 TABLET(10 MG) BY MOUTH DAILY WITH BREAKFAST 12/17/16  Yes [name], MD  aspirin 81 MG tablet Take 81 mg by mouth daily.     Yes Historical Provider, MD  DULoxetine (CYMBALTA) 30 MG capsule TAKE 1 CAPSULE BY MOUTH EVERY DAY 11/19/16  Yes [name], MD  esomeprazole (NEXIUM) 40 MG capsule TAKE 1 CAPSULE(40 MG) BY MOUTH DAILY 08/20/16  Yes [name], MD  finasteride (PROSCAR) 5 MG tablet Take 1 tablet (5 mg total) by mouth daily. 01/27/16  Yes [name], MD  gabapentin (NEURONTIN) 300 MG capsule TAKE 1 CAPSULE(300 MG) BY MOUTH THREE TIMES DAILY 12/17/16  Yes [name], MD  hydrochlorothiazide (MICROZIDE) 12.5 MG capsule Take 1 capsule (12.5 mg total) by mouth daily. 12/16/16  Yes [name], MD  KRILL OIL 1000 MG CAPS Take 1,000 mg by mouth daily.    Yes Historical Provider, MD  lamoTRIgine (LAMICTAL) 25 MG tablet TAKE 3 TABLETS BY MOUTH TWICE DAILY 11/24/16  Yes [name], MD  Methylphenidate HCl ER, XR, 40 MG CP24 Take 40 mg  by mouth every morning. 01/18/17  Yes [name], MD  verapamil (CALAN-SR) 240 MG CR tablet TAKE 1 TABLET(240 MG) BY MOUTH DAILY 12/17/16  Yes [name], MD  Cholecalciferol (VITAMIN D) 2000 UNITS CAPS Take 1 capsule (2,000 Units total) by mouth 1 dose over 46 hours. Patient not taking: Reported on 01/23/2017 02/04/11   Ricard Dillon, MD  meclizine (ANTIVERT) 12.5 MG tablet Take 1 tablet (12.5 mg total) by mouth 3 (three) times daily as needed for dizziness. 04/09/16   [name], MD  simvastatin (ZOCOR) 40 MG tablet Take 1 tablet (40 mg total) by mouth every evening. Patient not taking: Reported on 01/23/2017 12/16/16   [name], MD    Medications: Prior to Admission:  1. Ritalin XR 40 mg qam 2. Lamotrigine 75 mg bid 3. Neurontin 300 mg tid 4. Alfuzosin 10 mg qd 5. Verapamil ER 240 mg qd 6. HCTZ 12.5 mg qd 7. Cymbalta 30 mg  qd 8. Nexium 40 mg qd 9. Proscar 2.5 mg qd 10. Meclizine prn - not taken in months 11. Zocor 40 mg qd  Results for orders placed or performed during the hospital encounter of 01/23/17 (from the past 48 hour(s))  Protime-INR     Status: None   Collection Time: 01/23/17 12:27 PM  Result Value Ref Range   Prothrombin Time 13.6 11.4 - 15.2 seconds   INR 1.04   APTT     Status: None   Collection Time: 01/23/17 12:27 PM  Result Value Ref Range   aPTT 32 24 - 36 seconds  CBC     Status: None   Collection Time: 01/23/17 12:27 PM  Result Value Ref Range   WBC 7.4 4.0 - 10.5 K/uL   RBC 4.95 4.22 - 5.81 MIL/uL   Hemoglobin 14.7 13.0 - 17.0 g/dL   HCT 41.9 39.0 - 52.0 %   MCV 84.6 78.0 - 100.0 fL   MCH 29.7 26.0 - 34.0 pg   MCHC 35.1 30.0 - 36.0 g/dL   RDW 13.1 11.5 - 15.5 %   Platelets 253 150 - 400 K/uL  Differential     Status: None   Collection Time: 01/23/17 12:27 PM  Result Value Ref Range   Neutrophils Relative % 57 %   Neutro Abs 4.2 1.7 - 7.7 K/uL   Lymphocytes Relative 33 %   Lymphs Abs 2.5 0.7 - 4.0 K/uL   Monocytes Relative 9 %   Monocytes Absolute 0.6 0.1 - 1.0 K/uL   Eosinophils Relative 1 %   Eosinophils Absolute 0.1 0.0 - 0.7 K/uL   Basophils Relative 0 %   Basophils Absolute 0.0 0.0 - 0.1 K/uL  Comprehensive metabolic panel     Status: Abnormal   Collection Time: 01/23/17 12:27 PM  Result Value Ref Range   Sodium 140 135 - 145 mmol/L   Potassium 3.6 3.5 - 5.1 mmol/L   Chloride 107 101 - 111 mmol/L   CO2 26 22 - 32 mmol/L   Glucose, Bld 116 (H) 65 - 99 mg/dL   BUN 13 6 - 20 mg/dL   Creatinine, Ser 1.02 0.61 - 1.24 mg/dL   Calcium 9.0 8.9 - 10.3 mg/dL   Total Protein 7.0 6.5 - 8.1 g/dL   Albumin 4.1 3.5 - 5.0 g/dL   AST 31 15 - 41 U/L   ALT 29 17 - 63 U/L   Alkaline Phosphatase 55 38 - 126 U/L   Total Bilirubin 0.8 0.3 - 1.2 mg/dL  GFR calc non Af Amer >60 >60 mL/min   GFR calc Af Amer >60 >60 mL/min    Comment: (NOTE) The eGFR has been calculated  using the CKD EPI equation. This calculation has not been validated in all clinical situations. eGFR's persistently <60 mL/min signify possible Chronic Kidney Disease.    Anion gap 7 5 - 15  Ethanol     Status: None   Collection Time: 01/23/17 12:27 PM  Result Value Ref Range   Alcohol, Ethyl (B) <5 <5 mg/dL    Comment:        LOWEST DETECTABLE LIMIT FOR SERUM ALCOHOL IS 5 mg/dL FOR MEDICAL PURPOSES ONLY   I-stat troponin, ED     Status: None   Collection Time: 01/23/17 12:45 PM  Result Value Ref Range   Troponin i, poc 0.00 0.00 - 0.08 ng/mL   Comment 3            Comment: Due to the release kinetics of cTnI, a negative result within the first hours of the onset of symptoms does not rule out myocardial infarction with certainty. If myocardial infarction is still suspected, repeat the test at appropriate intervals.   I-Stat Chem 8, ED     Status: Abnormal   Collection Time: 01/23/17  2:29 PM  Result Value Ref Range   Sodium 140 135 - 145 mmol/L   Potassium 3.7 3.5 - 5.1 mmol/L   Chloride 105 101 - 111 mmol/L   BUN 15 6 - 20 mg/dL   Creatinine, Ser 1.00 0.61 - 1.24 mg/dL   Glucose, Bld 112 (H) 65 - 99 mg/dL   Calcium, Ion 1.15 1.15 - 1.40 mmol/L   TCO2 25 0 - 100 mmol/L   Hemoglobin 13.9 13.0 - 17.0 g/dL   HCT 41.0 39.0 - 52.0 %  I-Stat venous blood gas, ED     Status: Abnormal   Collection Time: 01/23/17  2:29 PM  Result Value Ref Range   pH, Ven 7.406 7.250 - 7.430   pCO2, Ven 36.6 (L) 44.0 - 60.0 mmHg   pO2, Ven 33.0 32.0 - 45.0 mmHg   Bicarbonate 23.0 20.0 - 28.0 mmol/L   TCO2 24 0 - 100 mmol/L   O2 Saturation 64.0 %   Acid-base deficit 1.0 0.0 - 2.0 mmol/L   Patient temperature HIDE    Sample type VENOUS    Comment NOTIFIED PHYSICIAN   Urinalysis, Routine w reflex microscopic     Status: Abnormal   Collection Time: 01/23/17  4:00 PM  Result Value Ref Range   Color, Urine YELLOW YELLOW   APPearance CLEAR CLEAR   Specific Gravity, Urine 1.017 1.005 - 1.030    pH 6.0 5.0 - 8.0   Glucose, UA NEGATIVE NEGATIVE mg/dL   Hgb urine dipstick NEGATIVE NEGATIVE   Bilirubin Urine NEGATIVE NEGATIVE   Ketones, ur NEGATIVE NEGATIVE mg/dL   Protein, ur NEGATIVE NEGATIVE mg/dL   Nitrite NEGATIVE NEGATIVE   Leukocytes, UA TRACE (A) NEGATIVE   RBC / HPF 0-5 0 - 5 RBC/hpf   WBC, UA 0-5 0 - 5 WBC/hpf   Bacteria, UA NONE SEEN NONE SEEN   Squamous Epithelial / LPF 0-5 (A) NONE SEEN    Dg Chest 2 View  Result Date: 01/23/2017 CLINICAL DATA:  Altered mental status, hypertension EXAM: CHEST  2 VIEW COMPARISON:  08/18/2016 FINDINGS: Normal heart size, mediastinal contours, and pulmonary vascularity. Lungs clear. No pleural effusion or pneumothorax. Osseous structures unremarkable. IMPRESSION: No acute abnormalities. Electronically Signed  By: Lavonia Dana M.D.   On: 01/23/2017 13:41   Ct Head Wo Contrast  Result Date: 01/23/2017 CLINICAL DATA:  Patient with confusion. EXAM: CT HEAD WITHOUT CONTRAST TECHNIQUE: Contiguous axial images were obtained from the base of the skull through the vertex without intravenous contrast. COMPARISON:  None. FINDINGS: Brain: Ventricles and sulci are appropriate for patient's age. No evidence for acute cortically based infarct, intracranial hemorrhage, mass lesion or mass-effect. Vascular: No hyperdense vessel or unexpected calcification. Skull: Normal. Negative for fracture or focal lesion. Sinuses/Orbits: Minimal opacification left mastoid air cells. Paranasal sinuses are well aerated. Other: None. IMPRESSION: No acute intracranial process. Electronically Signed   By: Lovey Newcomer M.D.   On: 01/23/2017 13:29    ROS Blood pressure 148/94, pulse 106, temperature 98.3 F (36.8 C), temperature source Oral, resp. rate 23, height 5' 4"  (1.626 m), weight 79.4 kg (175 lb), SpO2 97 %. Neurologic Examination:  Awake, alert, fully oriented. Language -fluent.  C/N/R- intact Motor 5/5 bilateral UE and LE Sensory - intact Coord- intact FTN, HTS  bil EOMI, PERL Face symmetrical Tongue midline. Gait- deferred.  Assessment/Plan:  Short duration confusion and disorientation and some amnesia with full resolution.  He does not fit the Transient global amnesia presentation.  He may have had a complex partial seizure and amphetamines can lower the threshold, but he has no clear substrate to bring this on in the first place.  A very localized hippocampal TIA is possible, but low probability.  I recommend doing an MRI Brain here in the ER.  If normal, then do EEG as outpatient without need to admit.  If abnormal, then  admit and we will make further recommendations.    Rogue Jury, MD 01/23/2017, 4:52 PM

## 2017-01-23 NOTE — ED Notes (Signed)
Pt. Ambulatory with steady gait to restroom at this time.

## 2017-01-25 ENCOUNTER — Encounter: Payer: Self-pay | Admitting: Neurology

## 2017-01-26 ENCOUNTER — Telehealth: Payer: Self-pay | Admitting: Neurology

## 2017-01-26 DIAGNOSIS — R4182 Altered mental status, unspecified: Secondary | ICD-10-CM

## 2017-01-26 NOTE — Telephone Encounter (Signed)
Brandy see below: I put in the order for the EEG, can you call and schedule and let him know I scheduled appt for 02/23/17 at 11:45 am?

## 2017-01-26 NOTE — Telephone Encounter (Signed)
Robert Mccall 2050-12-31. He called to make an appointment to see Dr. Carles Collet but Dr. Carles Collet does not have anything until April. I was not sure if he needed to be seen sooner than that? He also mentioned an EEG? Just wanted to make sure before I scheduled. His # is [phone]. Thank you

## 2017-01-26 NOTE — Telephone Encounter (Signed)
Can he wait til April?

## 2017-01-26 NOTE — Telephone Encounter (Signed)
Go ahead and just schedule EEG.  You can put on 3/20 (tues) at 11:45 but please leave on cx schedule so that if earlier comes up, we can see him and we won't have to use lunch time to see him

## 2017-01-27 ENCOUNTER — Ambulatory Visit (INDEPENDENT_AMBULATORY_CARE_PROVIDER_SITE_OTHER): Payer: PRIVATE HEALTH INSURANCE | Admitting: Neurology

## 2017-01-27 DIAGNOSIS — R4182 Altered mental status, unspecified: Secondary | ICD-10-CM | POA: Diagnosis not present

## 2017-01-28 ENCOUNTER — Telehealth: Payer: Self-pay | Admitting: Neurology

## 2017-01-28 ENCOUNTER — Encounter: Payer: Self-pay | Admitting: Internal Medicine

## 2017-01-28 NOTE — Telephone Encounter (Signed)
Patient made aware. He will keep follow up appt to discuss.

## 2017-01-28 NOTE — Procedures (Signed)
HPI:  66 y/o with MS change  TECHNICAL SUMMARY:  A multichannel referential and bipolar montage EEG using the standard international 10-20 system was performed on the patient described as awake, drowsy and asleep.  The dominant background activity consists of 9 hertz activity seen most prominantly over the posterior head region.  The backgound activity is reactive to eye opening and closing procedures.  Low voltage fast (beta) activity is distributed symmetrically and maximally over the anterior head regions.  ACTIVATION:  Stepwise photic stimulation at 4-20 flashes per second was performed and did not elicit any abnormal waveforms.  Hyperventilation was performed for 3 minutes with good patient effort and produced no changes in the background activity.  EPILEPTIFORM ACTIVITY:  There were no spikes, sharp waves or paroxysmal activity.  SLEEP:  Stage I and stage II sleep architecture were identified.  CARDIAC:  The EKG lead revealed a regular sinus rhythm.  IMPRESSION:  This is a normal EEG for the patients stated age.  There were no focal, hemispheric or lateralizing features.  No epileptiform activity was recorded.  A normal EEG does not exclude the diagnosis of a seizure disorder and if seizure remains high on the list of differential diagnosis, an ambulatory EEG may be of value.  Clinical correlation is required.

## 2017-01-28 NOTE — Telephone Encounter (Signed)
-----   Message from [place], DO sent at 01/28/2017  8:46 AM EST ----- You can let pt know that it is normal

## 2017-02-02 ENCOUNTER — Encounter: Payer: Self-pay | Admitting: Internal Medicine

## 2017-02-05 ENCOUNTER — Ambulatory Visit: Payer: PRIVATE HEALTH INSURANCE | Admitting: Neurology

## 2017-02-17 ENCOUNTER — Other Ambulatory Visit: Payer: Self-pay | Admitting: Internal Medicine

## 2017-02-22 NOTE — Progress Notes (Signed)
Subjective:   Robert Mccall was seen in consultation in the movement disorder clinic at the request of Scarlette Calico, MD.  The evaluation is for tremor.  The patient is a 66 y.o. right handed male with a history of tremor.   The patient notes tremor for the last few years; he notes it primarily when he uses it and notes it when he puts toothpaste on the toothbrush.  No new meds at the time.  Reports that he has been on methylphenidate for years and cymbalta started about 1 year ago.   There is no family hx of tremor.    Affected by caffeine:  No. (couple soda/pepsi free per day) Affected by alcohol:  Yes.   (drinks 1-2 glasses wine/day - helps tremor) Affected by stress:  No. Affected by fatigue:  No. Spills soup if on spoon:  No. Spills glass of liquid if full:  No. Affects ADL's (tying shoes, brushing teeth, etc):  Yes.   (notes it buttoning shirt/brushing teeth)  Current/Previously tried tremor medications: n/a  Current medications that may exacerbate tremor:  Methylphenidate, cymbalta  Pt also c/o memory change since last fall.  States that his coworkers have noted little mistakes on important reports at work.  He was having trouble typing that may have contributed but that got better.  He does do the finances in his house without difficulty.  He states that all bills are drafted but they always have been.  He is able to drive and get from place to place without getting lost.  No MVA's in the recent years.  Distributes medications to himself and remembers to take properly.  11/17/16 update:  Pt f/u today.  He had an MRI lumbar spine since our last visit that I had the opportunity to review.  This demonstrated: 1. a new caudal discextrusion at L4-5 with sequestered disc fragment which is superimposed on fairlycadvanced chronic disc, endplate, and facet degeneration. Severe spinal and moderate to severe biforaminal stenosis at that level has not significantly changed but there is increased  impingement of the descending left L5 nerve roots.  2. Chronic moderate to severe spinal and foraminal stenosis at L3-L4 is stable. Chronic L2-L3 moderate to severe right lateral recess and mild spinal stenosis is stable. Chronic up to moderate left lateral recess and severe left foraminal stenosis at L5-S1 is stable. 3. Increased borderline to mild lower thoracic spinal stenosis at T10-T11 and T12-L1.  Neurosx opinion was advised and the pt saw Dr. [name] on 09/29/16.  I reviewed those records.  The patient ultimately had microdiscectomy on 11/15.  Last visit, labs were done, which identified a slightly low B12 and he is now on a supplement.  IgG MGUS also identified and he saw Dr. [name] in July and September.  She will be following him one time a year.  In regards to tremor, he states that it is a bit less, unless he is at a social dinner.  Memory has been stable.  Noting some pinky and ring finger paresthesias on the L upon awakening but goes away when wakes up; injured elbow about a year ago.    02/23/17 update:  Pt seen today in follow up.  Last seen 3 months ago, at which point I released him from I care.  States that he returns as he had an episode on 01/23/17 that he describes as confusion where he couldn't remember things.  Had trouble remembering what he ate for breakfast or if he took his  medication.  Pt states that his roommate told him he kept repeating the same things such as "I can't find this."    No speech change according to the patient, but his roommate thought perhaps his speech was off.   Pt unsure of this today as his roommate wasn't there.   No lateralizing weakness or paresthesias.  Was evaluated in the emergency room and I reviewed those records.  Speech was normal in the emergency room, although he stated that it was January, 2020.  CT of the brain was done without contrast and MRI of the brain was done without contrast in the emergency room on 01/23/2017.  Both of these were  unremarkable.  I reviewed the films.  Diffusion weighted imaging was negative.  EEG was done in our office on 01/27/2017 and was normal.  Pt stated that entire event lasted 6 hours or so.  Pt doesn't remember much of day.  Pt has long hx of "ocular migraine."  No hx of similar and has not re-occurred since.  Patient does complain of memory loss.  States that it has been going on since long before the episode in February that was tender to the emergency room.  His primarily short-term memory loss.  Outside reports reviewed: historical medical records, lab reports and referral letter/letters.  No Known Allergies  Outpatient Encounter Prescriptions as of 02/23/2017  Medication Sig  . alfuzosin (UROXATRAL) 10 MG 24 hr tablet TAKE 1 TABLET(10 MG) BY MOUTH DAILY WITH BREAKFAST  . aspirin 81 MG tablet Take 81 mg by mouth daily.    . Cholecalciferol (VITAMIN D) 2000 UNITS CAPS Take 1 capsule (2,000 Units total) by mouth 1 dose over 46 hours.  . DULoxetine (CYMBALTA) 30 MG capsule TAKE 1 CAPSULE BY MOUTH EVERY DAY  . esomeprazole (NEXIUM) 40 MG capsule TAKE 1 CAPSULE(40 MG) BY MOUTH DAILY  . finasteride (PROSCAR) 5 MG tablet TAKE 1 TABLET(5 MG) BY MOUTH DAILY  . gabapentin (NEURONTIN) 300 MG capsule TAKE 1 CAPSULE(300 MG) BY MOUTH THREE TIMES DAILY (Patient taking differently: 1 tablet in the morning PRN)  . hydrochlorothiazide (MICROZIDE) 12.5 MG capsule Take 1 capsule (12.5 mg total) by mouth daily.  Marland Kitchen KRILL OIL 1000 MG CAPS Take 1,000 mg by mouth daily.   Marland Kitchen lamoTRIgine (LAMICTAL) 25 MG tablet TAKE 3 TABLETS BY MOUTH TWICE DAILY  . meclizine (ANTIVERT) 12.5 MG tablet Take 1 tablet (12.5 mg total) by mouth 3 (three) times daily as needed for dizziness.  . Methylphenidate HCl ER, XR, 40 MG CP24 Take 40 mg by mouth every morning.  . simvastatin (ZOCOR) 40 MG tablet Take 1 tablet (40 mg total) by mouth every evening.  . verapamil (CALAN-SR) 240 MG CR tablet TAKE 1 TABLET(240 MG) BY MOUTH DAILY  . vitamin  B-12 (CYANOCOBALAMIN) 1000 MCG tablet Take 1,000 mcg by mouth daily.   No facility-administered encounter medications on file as of 02/23/2017.     Past Medical History:  Diagnosis Date  . ADHD (attention deficit hyperactivity disorder)   . Anxiety   . Benign prostatic hypertrophy   . Chronic lower back pain    surgery schedule for back 10/21/16  . Cyst of right kidney    incidental [place] 1.3cm on R (MRI abd 09/2014), follows with uro  . Depression   . GERD (gastroesophageal reflux disease)   . Glaucoma   . History of migraine headaches    no meds  . Hyperlipidemia   . Hypertension     Past Surgical  History:  Procedure Laterality Date  . COLONOSCOPY  09/2007   polyps/ Deatra Ina  . HYDROCELE EXCISION Right 1990  . LUMBAR DISC SURGERY  10/21/2016  . ORCHIECTOMY Left 1970  . WISDOM TOOTH EXTRACTION      Social History   Social History  . Marital status: Single    Spouse name: N/A  . Number of children: N/A  . Years of education: N/A   Occupational History  . Consulting civil engineer   Social History Main Topics  . Smoking status: Never Smoker  . Smokeless tobacco: Never Used  . Alcohol use Yes     Comment: 1-2 glasses wine per day  . Drug use: No  . Sexual activity: Yes     Comment: working with replacement   Other Topics Concern  . Not on file   Social History Narrative  . No narrative on file    Family Status  Relation Status  . Mother Deceased at age 75   COPD, heart disease, breast ca  . Father Alive   DM, blind  . Brother Alive   stroke  . Sister Alive   healthy  . Sister Alive   bipolar, lupus  . Son Alive   healthy  . Daughter Alive   healthy  . Neg Hx     Review of Systems  A complete 10 system ROS was obtained and was negative apart from what is mentioned.   Objective:   VITALS:   Vitals:   02/23/17 1145  BP: 126/64  Pulse: 95  SpO2: 95%  Weight: 176 lb (79.8 kg)  Height: 5' 3.5" (1.613 m)   Gen:   Appears stated age and in NAD.  He is anxious HEENT:  Normocephalic, atraumatic. The mucous membranes are moist. The superficial temporal arteries are without ropiness or tenderness. Cardiovascular: tachycardic.  Regular rhythm. Lungs: Clear to auscultation bilaterally. Neck: There are no carotid bruits noted bilaterally.  NEUROLOGICAL:  Orientation:   Montreal Cognitive Assessment  05/18/2016  Visuospatial/ Executive (0/5) 5  Naming (0/3) 2  Attention: Read list of digits (0/2) 2  Attention: Read list of letters (0/1) 1  Attention: Serial 7 subtraction starting at 100 (0/3) 3  Language: Repeat phrase (0/2) 2  Language : Fluency (0/1) 1  Abstraction (0/2) 2  Delayed Recall (0/5) 4  Orientation (0/6) 6  Total 28  Adjusted Score (based on education) 28   Cranial nerves: There is good facial symmetry. Speech is fluent and clear. Soft palate rises symmetrically and there is no tongue deviation. Hearing is intact to conversational tone. Tone: Tone is good throughout. Sensation: Sensation is intact to light touch throughout Coordination:  The patient has no dysdiadichokinesia or dysmetria. Motor: Strength is 5/5 in the bilateral upper and lower extremities.  Shoulder shrug is equal bilaterally.  There is no pronator drift.  There are no fasciculations noted (pt in exam gown/shorts. DTR's: Deep tendon reflexes are 2/4 at the bilateral biceps, triceps, brachioradialis, 3/4 at the bilateral patella. Gait and Station: The patient is able to ambulate without difficulty.   MOVEMENT EXAM: Tremor:  There is no rest tremor today.  He has mild tremor of the outstretched hand that increases with intention   LABS  Lab Results  Component Value Date   TSH 1.27 02/03/2016   Lab Results  Component Value Date   VITAMINB12 281 05/18/2016    Lab Results  Component Value Date   FOLATE 20.3 05/18/2016  Chemistry      Component Value Date/Time   NA 140 01/23/2017 1429   NA 140 08/18/2016  0953   K 3.7 01/23/2017 1429   K 3.5 08/18/2016 0953   CL 105 01/23/2017 1429   CO2 26 01/23/2017 1227   CO2 26 08/18/2016 0953   BUN 15 01/23/2017 1429   BUN 17.2 08/18/2016 0953   CREATININE 1.00 01/23/2017 1429   CREATININE 1.1 08/18/2016 0953   GLU 102 08/02/2015      Component Value Date/Time   CALCIUM 9.0 01/23/2017 1227   CALCIUM 9.2 08/18/2016 0953   ALKPHOS 55 01/23/2017 1227   ALKPHOS 59 08/18/2016 0953   AST 31 01/23/2017 1227   AST 21 08/18/2016 0953   ALT 29 01/23/2017 1227   ALT 24 08/18/2016 0953   BILITOT 0.8 01/23/2017 1227   BILITOT 0.95 08/18/2016 0953        Lab Results  Component Value Date   HGBA1C 5.5 08/24/2016      Assessment/Plan:   1.  Probable TGA  -MRI and CT negative.  Routine EEG was negative.  Explained nature and pathophysiology of TGA.  Event sounds more like this than TIA or seizure.  ER felt that too short lived to be TGA but event was 6 hours long.  He does have a history of migraine.  Explained that this is more closely related to migraine than it is to stroke.  Advised that if it occurs in the future, I would still get checked out in the ER.  2.  Tremor.  - Tremor may be exacerbated by medication, especially methylphenidate.  Cymbalta may also contribute to tremor.   3.  Lumbar degenerative changes  -s/p microdiscectomy on 10/21/16  4.  Peripheral neuropathy with B12 deficiency and IgG MGUS  -limit EtOH  -B12 supplement 1019mcg daily  -continue yearly f/u with Dr. [name]  5.  Probable ulnar neuropathy at elbow  -only sx's at night  -educated pt  -no further tx unless sx's in day and persistent  6.  Memory loss  -I do not see any evidence of a neurodegenerative process.  Talked about neuropsych testing.  He was like to proceed.  He will get those results from Dr. Si Raider directly.  I will follow-up with him on an as-needed basis.  7.  Greater than 50% of the 30 minute visit spent in counseling.

## 2017-02-23 ENCOUNTER — Ambulatory Visit (INDEPENDENT_AMBULATORY_CARE_PROVIDER_SITE_OTHER): Payer: PRIVATE HEALTH INSURANCE | Admitting: Neurology

## 2017-02-23 ENCOUNTER — Ambulatory Visit: Payer: PRIVATE HEALTH INSURANCE | Admitting: Neurology

## 2017-02-23 ENCOUNTER — Encounter: Payer: Self-pay | Admitting: Neurology

## 2017-02-23 VITALS — BP 126/64 | HR 95 | Ht 63.5 in | Wt 176.0 lb

## 2017-02-23 DIAGNOSIS — G454 Transient global amnesia: Secondary | ICD-10-CM

## 2017-02-23 DIAGNOSIS — R413 Other amnesia: Secondary | ICD-10-CM

## 2017-02-23 NOTE — Patient Instructions (Signed)
Patients age 66+:  You have been referred for a neurocognitive evaluation in our office.   The evaluation takes approximately two hours. The first part of the appointment is a clinical interview with the neuropsychologist (Dr. Macarthur Critchley). Please bring someone with you to this appointment if possible, as it is helpful for Dr. Si Raider to hear from both you and another adult who knows you well. After speaking with Dr. Si Raider, you will complete testing with her technician. The testing includes a variety of tasks- mostly question-and-answer, some paper-and-pencil. There is nothing you need to do to prepare for this appointment, but having a good night's sleep prior to the testing, and bringing eyeglasses and hearing aids (if you wear them), is advised.   About a week after the evaluation, you will return to follow up with Dr. Si Raider to review the test results. This appointment is about 30 minutes. If you would like a family member to receive this information as well, please bring them to the appointment.   We have to reserve several hours of the neuropsychologist's time and the psychometrician's time for your evaluation appointment. As such, please note that there is a No-Show fee of $100. If you are unable to attend any of your appointments, please contact our office as soon as possible to reschedule.    Patients age 85 and under:  You have been referred for a neurocognitive evaluation in our office.   The evaluation consists of three appointments.   1. The first appointment is about 45 minutes and is a clinical interview with the neuropsychologist (Dr. Macarthur Critchley). You can bring someone with you to this appointment, as it is helpful for Dr. Si Raider to hear from both you and another adult who knows you well.   2. The second appointment is 2-3 hours long and is with the psychometrician Milana Kidney). You will complete a variety of tasks- mostly question-and-answer, some paper-and-pencil,  some on the computer. There is nothing you need to do to prepare for this appointment, but having a good night's sleep prior to the testing, and bringing eyeglasses and hearing aids (if you wear them), is advised.   3. The final appointment is a follow-up with Dr. Si Raider where she will go over the test results with you and provide recommendations. This appointment is about 30 minutes.  If you would like a family member to receive this information as well, please bring them to the appointment.   We have to reserve several hours of the neuropsychologist's time and the psychometrician's time for your appointment. As such, please note that there is a No-Show fee of $100. If you are unable to attend any of your appointments, please contact our office as soon as possible to reschedule.

## 2017-02-26 ENCOUNTER — Encounter: Payer: Self-pay | Admitting: Psychology

## 2017-04-01 ENCOUNTER — Encounter: Payer: PRIVATE HEALTH INSURANCE | Admitting: Psychology

## 2017-04-13 ENCOUNTER — Encounter: Payer: PRIVATE HEALTH INSURANCE | Admitting: Psychology

## 2017-05-13 ENCOUNTER — Other Ambulatory Visit: Payer: Self-pay | Admitting: Internal Medicine

## 2017-05-18 ENCOUNTER — Other Ambulatory Visit: Payer: Self-pay | Admitting: Internal Medicine

## 2017-06-06 ENCOUNTER — Other Ambulatory Visit: Payer: Self-pay | Admitting: Internal Medicine

## 2017-06-20 ENCOUNTER — Telehealth: Payer: Self-pay

## 2017-06-20 NOTE — Telephone Encounter (Signed)
Called and left a message with a new appt due to dr gorsuch out of office  Berk Pilot 

## 2017-07-13 ENCOUNTER — Other Ambulatory Visit (INDEPENDENT_AMBULATORY_CARE_PROVIDER_SITE_OTHER): Payer: PRIVATE HEALTH INSURANCE

## 2017-07-13 ENCOUNTER — Ambulatory Visit (INDEPENDENT_AMBULATORY_CARE_PROVIDER_SITE_OTHER): Payer: PRIVATE HEALTH INSURANCE | Admitting: Internal Medicine

## 2017-07-13 ENCOUNTER — Encounter: Payer: Self-pay | Admitting: Internal Medicine

## 2017-07-13 VITALS — BP 138/82 | HR 110 | Temp 98.0°F | Resp 16 | Ht 63.5 in | Wt 176.8 lb

## 2017-07-13 DIAGNOSIS — I519 Heart disease, unspecified: Secondary | ICD-10-CM

## 2017-07-13 DIAGNOSIS — Z23 Encounter for immunization: Secondary | ICD-10-CM | POA: Diagnosis not present

## 2017-07-13 DIAGNOSIS — R9430 Abnormal result of cardiovascular function study, unspecified: Secondary | ICD-10-CM

## 2017-07-13 DIAGNOSIS — N5201 Erectile dysfunction due to arterial insufficiency: Secondary | ICD-10-CM

## 2017-07-13 DIAGNOSIS — I10 Essential (primary) hypertension: Secondary | ICD-10-CM

## 2017-07-13 DIAGNOSIS — R6882 Decreased libido: Secondary | ICD-10-CM | POA: Diagnosis not present

## 2017-07-13 DIAGNOSIS — Z0000 Encounter for general adult medical examination without abnormal findings: Secondary | ICD-10-CM | POA: Diagnosis not present

## 2017-07-13 DIAGNOSIS — F900 Attention-deficit hyperactivity disorder, predominantly inattentive type: Secondary | ICD-10-CM

## 2017-07-13 DIAGNOSIS — R0609 Other forms of dyspnea: Secondary | ICD-10-CM

## 2017-07-13 DIAGNOSIS — R9431 Abnormal electrocardiogram [ECG] [EKG]: Secondary | ICD-10-CM

## 2017-07-13 DIAGNOSIS — Z0001 Encounter for general adult medical examination with abnormal findings: Secondary | ICD-10-CM | POA: Diagnosis not present

## 2017-07-13 DIAGNOSIS — E291 Testicular hypofunction: Secondary | ICD-10-CM | POA: Insufficient documentation

## 2017-07-13 DIAGNOSIS — I5189 Other ill-defined heart diseases: Secondary | ICD-10-CM

## 2017-07-13 LAB — CBC WITH DIFFERENTIAL/PLATELET
BASOS ABS: 0 10*3/uL (ref 0.0–0.1)
Basophils Relative: 0.6 % (ref 0.0–3.0)
EOS ABS: 0.2 10*3/uL (ref 0.0–0.7)
Eosinophils Relative: 2.7 % (ref 0.0–5.0)
HEMATOCRIT: 45 % (ref 39.0–52.0)
Hemoglobin: 15.3 g/dL (ref 13.0–17.0)
LYMPHS PCT: 41.2 % (ref 12.0–46.0)
Lymphs Abs: 2.4 10*3/uL (ref 0.7–4.0)
MCHC: 34 g/dL (ref 30.0–36.0)
MCV: 88.9 fl (ref 78.0–100.0)
Monocytes Absolute: 0.6 10*3/uL (ref 0.1–1.0)
Monocytes Relative: 10.7 % (ref 3.0–12.0)
NEUTROS ABS: 2.6 10*3/uL (ref 1.4–7.7)
Neutrophils Relative %: 44.8 % (ref 43.0–77.0)
PLATELETS: 241 10*3/uL (ref 150.0–400.0)
RBC: 5.06 Mil/uL (ref 4.22–5.81)
RDW: 13 % (ref 11.5–15.5)
WBC: 5.8 10*3/uL (ref 4.0–10.5)

## 2017-07-13 LAB — URINALYSIS, ROUTINE W REFLEX MICROSCOPIC
BILIRUBIN URINE: NEGATIVE
HGB URINE DIPSTICK: NEGATIVE
Ketones, ur: NEGATIVE
LEUKOCYTES UA: NEGATIVE
NITRITE: NEGATIVE
RBC / HPF: NONE SEEN (ref 0–?)
Specific Gravity, Urine: >=1.03 — AB (ref 1.000–1.030)
Urine Glucose: NEGATIVE
Urobilinogen, UA: 0.2 (ref 0.0–1.0)
WBC UA: NONE SEEN (ref 0–?)
pH: 6 (ref 5.0–8.0)

## 2017-07-13 LAB — LIPID PANEL
CHOLESTEROL: 131 mg/dL (ref 0–200)
HDL: 40.4 mg/dL (ref >39.00)
LDL Cholesterol: 69 mg/dL (ref 0–99)
NonHDL: 90.2
TRIGLYCERIDES: 105 mg/dL (ref 0.0–149.0)
Total CHOL/HDL Ratio: 3
VLDL: 21 mg/dL (ref 0.0–40.0)

## 2017-07-13 LAB — THYROID PANEL WITH TSH
Free Thyroxine Index: 2 (ref 1.4–3.8)
T3 UPTAKE: 28 % (ref 22–35)
T4 TOTAL: 7 ug/dL (ref 4.9–10.5)
TSH: 2.37 mIU/L (ref 0.40–4.50)

## 2017-07-13 LAB — COMPREHENSIVE METABOLIC PANEL
ALBUMIN: 4.6 g/dL (ref 3.5–5.2)
ALK PHOS: 67 U/L (ref 39–117)
ALT: 37 U/L (ref 0–53)
AST: 23 U/L (ref 0–37)
BILIRUBIN TOTAL: 0.3 mg/dL (ref 0.2–1.2)
BUN: 17 mg/dL (ref 6–23)
CALCIUM: 9.3 mg/dL (ref 8.4–10.5)
CO2: 28 meq/L (ref 19–32)
CREATININE: 0.97 mg/dL (ref 0.40–1.50)
Chloride: 102 mEq/L (ref 96–112)
GFR: 82.22 mL/min (ref >60.00)
Glucose, Bld: 128 mg/dL — ABNORMAL HIGH (ref 70–99)
Potassium: 3.5 mEq/L (ref 3.5–5.1)
Sodium: 140 mEq/L (ref 135–145)
TOTAL PROTEIN: 7.3 g/dL (ref 6.0–8.3)

## 2017-07-13 LAB — PROLACTIN: PROLACTIN: 6 ng/mL (ref 2.0–18.0)

## 2017-07-13 LAB — PSA: PSA: 0.23 ng/mL (ref 0.10–4.00)

## 2017-07-13 MED ORDER — METHYLPHENIDATE HCL ER (XR) 40 MG PO CP24: 40.0000 mg | ORAL_CAPSULE | ORAL | 0 refills | Status: DC

## 2017-07-13 NOTE — Patient Instructions (Signed)

## 2017-07-13 NOTE — Progress Notes (Signed)
Subjective:  Patient ID: Robert Mccall, male    DOB: [birth year]  Age: 66 y.o. MRN: [id number]  CC: Annual Exam and Hypertension   HPI Robert Mccall presents for a CPX.  He complains of worsening fatigue and dyspnea on exertion. He's had no recent episodes of chest pain, diaphoresis, or near-syncope.  He also complains of erectile dysfunction and decreasing libido. He recently tried a medication for ED and says it was not effective.  Pt states ADD status overall stable on current meds with overall good compliance and tolerability, and good effectiveness with respect to ability for concentration and task completion.  Outpatient Medications Prior to Visit  Medication Sig Dispense Refill  . alfuzosin (UROXATRAL) 10 MG 24 hr tablet TAKE 1 TABLET(10 MG) BY MOUTH DAILY WITH BREAKFAST 90 tablet 1  . aspirin 81 MG tablet Take 81 mg by mouth daily.      . Cholecalciferol (VITAMIN D) 2000 UNITS CAPS Take 1 capsule (2,000 Units total) by mouth 1 dose over 46 hours. 30 capsule 11  . DULoxetine (CYMBALTA) 30 MG capsule TAKE 1 CAPSULE BY MOUTH EVERY DAY 30 capsule 11  . esomeprazole (NEXIUM) 40 MG capsule TAKE 1 CAPSULE(40 MG) BY MOUTH DAILY 90 capsule 1  . finasteride (PROSCAR) 5 MG tablet TAKE 1 TABLET(5 MG) BY MOUTH DAILY 90 tablet 1  . gabapentin (NEURONTIN) 300 MG capsule TAKE 1 CAPSULE(300 MG) BY MOUTH THREE TIMES DAILY (Patient taking differently: 1 tablet in the morning PRN) 270 capsule 1  . hydrochlorothiazide (MICROZIDE) 12.5 MG capsule Take 1 capsule (12.5 mg total) by mouth daily. 90 capsule 1  . KRILL OIL 1000 MG CAPS Take 1,000 mg by mouth daily.     Marland Kitchen lamoTRIgine (LAMICTAL) 25 MG tablet TAKE 3 TABLETS BY MOUTH TWICE DAILY 540 tablet 0  . meclizine (ANTIVERT) 12.5 MG tablet Take 1 tablet (12.5 mg total) by mouth 3 (three) times daily as needed for dizziness. 30 tablet 0  . simvastatin (ZOCOR) 40 MG tablet Take 1 tablet (40 mg total) by mouth every evening. 90 tablet 1  . verapamil  (CALAN-SR) 240 MG CR tablet TAKE 1 TABLET(240 MG) BY MOUTH DAILY 90 tablet 1  . vitamin B-12 (CYANOCOBALAMIN) 1000 MCG tablet Take 1,000 mcg by mouth daily.    . Methylphenidate HCl ER, XR, 40 MG CP24 Take 40 mg by mouth every morning. 30 capsule 0   No facility-administered medications prior to visit.     ROS Review of Systems  Constitutional: Positive for fatigue. Negative for activity change, appetite change, diaphoresis and unexpected weight change.  HENT: Negative.  Negative for trouble swallowing.   Eyes: Negative for visual disturbance.  Respiratory: Positive for shortness of breath (DOE). Negative for cough, chest tightness and wheezing.   Cardiovascular: Negative.  Negative for chest pain, palpitations and leg swelling.  Gastrointestinal: Negative for abdominal pain, blood in stool, constipation, diarrhea, nausea and vomiting.  Endocrine: Negative.   Genitourinary: Negative.  Negative for difficulty urinating, discharge, frequency, penile pain, penile swelling, scrotal swelling, testicular pain and urgency.  Musculoskeletal: Negative.  Negative for arthralgias, back pain and myalgias.  Skin: Negative.  Negative for color change and rash.  Allergic/Immunologic: Negative.   Neurological: Negative.  Negative for dizziness.  Hematological: Negative for adenopathy. Does not bruise/bleed easily.  Psychiatric/Behavioral: Negative.     Objective:  BP 138/82 (BP Location: Left Arm, Patient Position: Sitting, Cuff Size: Normal)   Pulse (!) 110   Temp 98 F (36.7 C) (Oral)  Resp 16   Ht 5' 3.5" (1.613 m)   Wt 176 lb 12 oz (80.2 kg)   SpO2 98%   BMI 30.82 kg/m   BP Readings from Last 3 Encounters:  07/13/17 138/82  02/23/17 126/64  01/23/17 147/91    Wt Readings from Last 3 Encounters:  07/13/17 176 lb 12 oz (80.2 kg)  02/23/17 176 lb (79.8 kg)  01/23/17 175 lb (79.4 kg)    Physical Exam  Constitutional: No distress.  HENT:  Mouth/Throat: Oropharynx is clear and  moist. No oropharyngeal exudate.  Eyes: Conjunctivae are normal. Right eye exhibits no discharge. Left eye exhibits no discharge. No scleral icterus.  Neck: Normal range of motion. Neck supple. No JVD present. No thyromegaly present.  Cardiovascular: Normal rate, regular rhythm and intact distal pulses.  Exam reveals no gallop and no friction rub.   No murmur heard. EKG - Sinus  Tachycardia  -Old inferior infarct.   ABNORMAL - no change from prior EKG  Pulmonary/Chest: Effort normal and breath sounds normal. No respiratory distress. He has no wheezes. He has no rales. He exhibits no tenderness.  Abdominal: Soft. Bowel sounds are normal. He exhibits no distension and no mass. There is no tenderness. There is no rebound and no guarding. Hernia confirmed negative in the right inguinal area and confirmed negative in the left inguinal area.  Genitourinary: Rectum normal, testes normal and penis normal. Rectal exam shows no external hemorrhoid, no internal hemorrhoid, no fissure, no mass, no tenderness, anal tone normal and guaiac negative stool. Prostate is enlarged (1+ smooth symm BPH). Prostate is not tender. Right testis shows no mass, no swelling and no tenderness. Right testis is descended. Left testis shows no mass, no swelling and no tenderness. Left testis is descended. Circumcised. No penile erythema or penile tenderness. No discharge found.  Musculoskeletal: Normal range of motion. He exhibits no edema, tenderness or deformity.  Lymphadenopathy:    He has no cervical adenopathy.       Right: No inguinal adenopathy present.       Left: No inguinal adenopathy present.  Skin: He is not diaphoretic.  Vitals reviewed.   Lab Results  Component Value Date   WBC 7.4 01/23/2017   HGB 13.9 01/23/2017   HCT 41.0 01/23/2017   PLT 253 01/23/2017   GLUCOSE 112 (H) 01/23/2017   CHOL 126 08/24/2016   TRIG 173 (H) 08/24/2016   HDL 41 08/24/2016   LDLCALC 50 08/24/2016   ALT 29 01/23/2017   AST  31 01/23/2017   NA 140 01/23/2017   K 3.7 01/23/2017   CL 105 01/23/2017   CREATININE 1.00 01/23/2017   BUN 15 01/23/2017   CO2 26 01/23/2017   TSH 1.27 02/03/2016   PSA 0.38 02/03/2016   INR 1.04 01/23/2017   HGBA1C 5.5 08/24/2016    Dg Chest 2 View  Result Date: 01/23/2017 CLINICAL DATA:  Altered mental status, hypertension EXAM: CHEST  2 VIEW COMPARISON:  08/18/2016 FINDINGS: Normal heart size, mediastinal contours, and pulmonary vascularity. Lungs clear. No pleural effusion or pneumothorax. Osseous structures unremarkable. IMPRESSION: No acute abnormalities. Electronically Signed   By: Lavonia Dana M.D.   On: 01/23/2017 13:41   Ct Head Wo Contrast  Result Date: 01/23/2017 CLINICAL DATA:  Patient with confusion. EXAM: CT HEAD WITHOUT CONTRAST TECHNIQUE: Contiguous axial images were obtained from the base of the skull through the vertex without intravenous contrast. COMPARISON:  None. FINDINGS: Brain: Ventricles and sulci are appropriate for patient's age. No evidence  for acute cortically based infarct, intracranial hemorrhage, mass lesion or mass-effect. Vascular: No hyperdense vessel or unexpected calcification. Skull: Normal. Negative for fracture or focal lesion. Sinuses/Orbits: Minimal opacification left mastoid air cells. Paranasal sinuses are well aerated. Other: None. IMPRESSION: No acute intracranial process. Electronically Signed   By: Lovey Newcomer M.D.   On: 01/23/2017 13:29   Mr Brain Wo Contrast  Result Date: 01/23/2017 CLINICAL DATA:  66 year old male with acute confusion this morning. Retrograde amnesia. Initial encounter. EXAM: MRI HEAD WITHOUT CONTRAST TECHNIQUE: Multiplanar, multiecho pulse sequences of the brain and surrounding structures were obtained without intravenous contrast. COMPARISON:  Head CT 1313 hours today. FINDINGS: Brain: The examination had to be discontinued prior to completion due to claustrophobia. Axial T1 weighted in coronal T2 weighted images were not  obtained. No restricted diffusion to suggest acute infarction. No midline shift, mass effect, evidence of mass lesion, ventriculomegaly, extra-axial collection or acute intracranial hemorrhage. Cervicomedullary junction and pituitary are within normal limits. Pearline Cables and white matter signal is within normal limits for age throughout the brain. No cortical encephalomalacia or chronic cerebral blood products. Vascular: Major intracranial vascular flow voids are preserved, the distal right vertebral artery appears dominant. Skull and upper cervical spine: Mild cervical spine disc and endplate degeneration. Normal bone marrow signal. Sinuses/Orbits: Visualized orbit soft tissues are within normal limits. Visualized paranasal sinuses are stable and well pneumatized. Other: Mild left mastoid effusion again noted. Negative nasopharynx. Negative scalp soft tissues. IMPRESSION: 1. No acute intracranial abnormality. Normal for age noncontrast MRI appearance of the brain. 2. The examination had to be discontinued shortly prior to completion due to claustrophobia. 3. Mild left mastoid effusion, likely postinflammatory and significance doubtful. Electronically Signed   By: Genevie Ann M.D.   On: 01/23/2017 18:52    Assessment & Plan:   Marco was seen today for annual exam and hypertension.  Diagnoses and all orders for this visit:  DOE (dyspnea on exertion)- his EKG shows tachycardia and persistent Q waves in the inferior leads. The EKG has not changed over the last few months. I've asked him to undergo a myocardial perfusion imaging to screen for ischemia. -     EKG 12-Lead -     Myocardial Perfusion Imaging; Future  Essential hypertension- his blood pressure is well-controlled, electrolytes and renal function are normal. -     Thyroid Panel With TSH; Future -     Urinalysis, Routine w reflex microscopic; Future  Routine general medical examination at a health care facility- exam completed, labs reviewed, vaccines  reviewed and updated, screening for colon cancer is up-to-date, patient education material was given. -     Lipid panel; Future -     Comprehensive metabolic panel; Future -     CBC with Differential/Platelet; Future -     PSA; Future  Decreased libido- his prolactin is normal but testosterone is low. He will let me know if he wants to treat this with testosterone replacement therapy. -     Prolactin; Future -     Testosterone Total,Free,Bio, Males; Future  Erectile dysfunction due to arterial insufficiency- as above -     Prolactin; Future -     Testosterone Total,Free,Bio, Males; Future  Nonspecific abnormal electrocardiogram (ECG) (EKG)- as above -     Myocardial Perfusion Imaging; Future  Attention deficit hyperactivity disorder (ADHD), predominantly inattentive type -     Methylphenidate HCl ER, XR, 40 MG CP24; Take 40 mg by mouth every morning.   I am having  Robert Mccall maintain his aspirin, Astrid Drafts, Vitamin D, meclizine, DULoxetine, hydrochlorothiazide, simvastatin, gabapentin, finasteride, vitamin B-12, esomeprazole, lamoTRIgine, alfuzosin, verapamil, and Methylphenidate HCl ER (XR).  Meds ordered this encounter  Medications  . Methylphenidate HCl ER, XR, 40 MG CP24    Sig: Take 40 mg by mouth every morning.    Dispense:  30 capsule    Refill:  0    May fill once every 30 days. August  2018     Follow-up: Return in about 4 weeks (around 08/10/2017).  Scarlette Calico, MD

## 2017-07-14 ENCOUNTER — Encounter: Payer: Self-pay | Admitting: Internal Medicine

## 2017-07-14 LAB — TESTOSTERONE TOTAL,FREE,BIO, MALES
ALBUMIN: 4.4 g/dL (ref 3.6–5.1)
Sex Hormone Binding: 46 nmol/L (ref 22–77)
Testosterone, Bioavailable: 79 ng/dL — ABNORMAL LOW (ref 110.0–575.0)
Testosterone, Free: 39.3 pg/mL — ABNORMAL LOW (ref 46.0–224.0)
Testosterone: 397 ng/dL (ref 250–827)

## 2017-07-15 ENCOUNTER — Telehealth (HOSPITAL_COMMUNITY): Payer: Self-pay | Admitting: Internal Medicine

## 2017-07-15 ENCOUNTER — Other Ambulatory Visit: Payer: Self-pay | Admitting: Internal Medicine

## 2017-07-15 DIAGNOSIS — E291 Testicular hypofunction: Secondary | ICD-10-CM

## 2017-07-15 MED ORDER — TESTOSTERONE 40.5 MG/2.5GM (1.62%) TD GEL: 1.0000 | Freq: Every day | TRANSDERMAL | 5 refills | Status: DC

## 2017-07-15 NOTE — Progress Notes (Unsigned)
Subjective:  Patient ID: Robert Mccall, male    DOB: [birth year]  Age: 66 y.o. MRN: [id number]  CC: No chief complaint on file.   HPI Robert Mccall presents for ***  Outpatient Medications Prior to Visit  Medication Sig Dispense Refill  . alfuzosin (UROXATRAL) 10 MG 24 hr tablet TAKE 1 TABLET(10 MG) BY MOUTH DAILY WITH BREAKFAST 90 tablet 1  . aspirin 81 MG tablet Take 81 mg by mouth daily.      . Cholecalciferol (VITAMIN D) 2000 UNITS CAPS Take 1 capsule (2,000 Units total) by mouth 1 dose over 46 hours. 30 capsule 11  . DULoxetine (CYMBALTA) 30 MG capsule TAKE 1 CAPSULE BY MOUTH EVERY DAY 30 capsule 11  . esomeprazole (NEXIUM) 40 MG capsule TAKE 1 CAPSULE(40 MG) BY MOUTH DAILY 90 capsule 1  . finasteride (PROSCAR) 5 MG tablet TAKE 1 TABLET(5 MG) BY MOUTH DAILY 90 tablet 1  . gabapentin (NEURONTIN) 300 MG capsule TAKE 1 CAPSULE(300 MG) BY MOUTH THREE TIMES DAILY (Patient taking differently: 1 tablet in the morning PRN) 270 capsule 1  . hydrochlorothiazide (MICROZIDE) 12.5 MG capsule Take 1 capsule (12.5 mg total) by mouth daily. 90 capsule 1  . KRILL OIL 1000 MG CAPS Take 1,000 mg by mouth daily.     Marland Kitchen lamoTRIgine (LAMICTAL) 25 MG tablet TAKE 3 TABLETS BY MOUTH TWICE DAILY 540 tablet 0  . meclizine (ANTIVERT) 12.5 MG tablet Take 1 tablet (12.5 mg total) by mouth 3 (three) times daily as needed for dizziness. 30 tablet 0  . Methylphenidate HCl ER, XR, 40 MG CP24 Take 40 mg by mouth every morning. 30 capsule 0  . simvastatin (ZOCOR) 40 MG tablet Take 1 tablet (40 mg total) by mouth every evening. 90 tablet 1  . verapamil (CALAN-SR) 240 MG CR tablet TAKE 1 TABLET(240 MG) BY MOUTH DAILY 90 tablet 1  . vitamin B-12 (CYANOCOBALAMIN) 1000 MCG tablet Take 1,000 mcg by mouth daily.     No facility-administered medications prior to visit.     ROS Review of Systems  Objective:  There were no vitals taken for this visit.  BP Readings from Last 3 Encounters:  07/13/17 138/82  02/23/17  126/64  01/23/17 147/91    Wt Readings from Last 3 Encounters:  07/13/17 176 lb 12 oz (80.2 kg)  02/23/17 176 lb (79.8 kg)  01/23/17 175 lb (79.4 kg)    Physical Exam  Lab Results  Component Value Date   WBC 5.8 07/13/2017   HGB 15.3 07/13/2017   HCT 45.0 07/13/2017   PLT 241.0 07/13/2017   GLUCOSE 128 (H) 07/13/2017   CHOL 131 07/13/2017   TRIG 105.0 07/13/2017   HDL 40.40 07/13/2017   LDLCALC 69 07/13/2017   ALT 37 07/13/2017   AST 23 07/13/2017   NA 140 07/13/2017   K 3.5 07/13/2017   CL 102 07/13/2017   CREATININE 0.97 07/13/2017   BUN 17 07/13/2017   CO2 28 07/13/2017   TSH 2.37 07/13/2017   PSA 0.23 07/13/2017   INR 1.04 01/23/2017   HGBA1C 5.5 08/24/2016    Dg Chest 2 View  Result Date: 01/23/2017 CLINICAL DATA:  Altered mental status, hypertension EXAM: CHEST  2 VIEW COMPARISON:  08/18/2016 FINDINGS: Normal heart size, mediastinal contours, and pulmonary vascularity. Lungs clear. No pleural effusion or pneumothorax. Osseous structures unremarkable. IMPRESSION: No acute abnormalities. Electronically Signed   By: Robert Mccall M.D.   On: 01/23/2017 13:41   Ct Head Wo Contrast  Result  Date: 01/23/2017 CLINICAL DATA:  Patient with confusion. EXAM: CT HEAD WITHOUT CONTRAST TECHNIQUE: Contiguous axial images were obtained from the base of the skull through the vertex without intravenous contrast. COMPARISON:  None. FINDINGS: Brain: Ventricles and sulci are appropriate for patient's age. No evidence for acute cortically based infarct, intracranial hemorrhage, mass lesion or mass-effect. Vascular: No hyperdense vessel or unexpected calcification. Skull: Normal. Negative for fracture or focal lesion. Sinuses/Orbits: Minimal opacification left mastoid air cells. Paranasal sinuses are well aerated. Other: None. IMPRESSION: No acute intracranial process. Electronically Signed   By: Robert Mccall M.D.   On: 01/23/2017 13:29   Mr Brain Wo Contrast  Result Date:  01/23/2017 CLINICAL DATA:  66 year old male with acute confusion this morning. Retrograde amnesia. Initial encounter. EXAM: MRI HEAD WITHOUT CONTRAST TECHNIQUE: Multiplanar, multiecho pulse sequences of the brain and surrounding structures were obtained without intravenous contrast. COMPARISON:  Head CT 1313 hours today. FINDINGS: Brain: The examination had to be discontinued prior to completion due to claustrophobia. Axial T1 weighted in coronal T2 weighted images were not obtained. No restricted diffusion to suggest acute infarction. No midline shift, mass effect, evidence of mass lesion, ventriculomegaly, extra-axial collection or acute intracranial hemorrhage. Cervicomedullary junction and pituitary are within normal limits. Pearline Cables and white matter signal is within normal limits for age throughout the brain. No cortical encephalomalacia or chronic cerebral blood products. Vascular: Major intracranial vascular flow voids are preserved, the distal right vertebral artery appears dominant. Skull and upper cervical spine: Mild cervical spine disc and endplate degeneration. Normal bone marrow signal. Sinuses/Orbits: Visualized orbit soft tissues are within normal limits. Visualized paranasal sinuses are stable and well pneumatized. Other: Mild left mastoid effusion again noted. Negative nasopharynx. Negative scalp soft tissues. IMPRESSION: 1. No acute intracranial abnormality. Normal for age noncontrast MRI appearance of the brain. 2. The examination had to be discontinued shortly prior to completion due to claustrophobia. 3. Mild left mastoid effusion, likely postinflammatory and significance doubtful. Electronically Signed   By: Robert Mccall M.D.   On: 01/23/2017 18:52    Assessment & Plan:   Diagnoses and all orders for this visit:  Hypogonadism male -     Testosterone 40.5 MG/2.5GM (1.62%) GEL; Place 1 Act onto the skin daily.   I am having [name] start on Testosterone. I am also having him maintain his  aspirin, Krill Oil, Vitamin D, meclizine, DULoxetine, hydrochlorothiazide, simvastatin, gabapentin, finasteride, vitamin B-12, esomeprazole, lamoTRIgine, alfuzosin, verapamil, and Methylphenidate HCl ER (XR).  Meds ordered this encounter  Medications  . Testosterone 40.5 MG/2.5GM (1.62%) GEL    Sig: Place 1 Act onto the skin daily.    Dispense:  2.5 g    Refill:  5     Follow-up: No Follow-up on file.  Scarlette Calico, MD

## 2017-07-16 ENCOUNTER — Telehealth (HOSPITAL_COMMUNITY): Payer: Self-pay

## 2017-07-16 NOTE — Telephone Encounter (Signed)
User: Cherie Dark A Date/time: 07/15/17 3:42 PM  Comment: Called and lmsg for him to CB to get scheduled for myoview.   Context:  Outcome: Left Message  Phone number: [phone] Phone Type: Home Phone  Comm. type: Telephone Call type: Outgoing  Contact: Macie Burows Relation to patient: Self

## 2017-07-16 NOTE — Telephone Encounter (Signed)
Encounter complete. 

## 2017-07-21 ENCOUNTER — Ambulatory Visit (HOSPITAL_COMMUNITY)
Admission: RE | Admit: 2017-07-21 | Discharge: 2017-07-21 | Disposition: A | Discharge status: Home / Self Care | Payer: No Typology Code available for payment source | Source: Ambulatory Visit | Attending: Cardiovascular Disease | Admitting: Cardiovascular Disease

## 2017-07-21 DIAGNOSIS — R0609 Other forms of dyspnea: Secondary | ICD-10-CM | POA: Insufficient documentation

## 2017-07-21 DIAGNOSIS — R9431 Abnormal electrocardiogram [ECG] [EKG]: Secondary | ICD-10-CM | POA: Diagnosis not present

## 2017-07-21 LAB — MYOCARDIAL PERFUSION IMAGING
CHL CUP NUCLEAR SRS: 0
CHL CUP NUCLEAR SSS: 1
LV dias vol: 113 mL (ref 62–150)
LVSYSVOL: 63 mL
NUC STRESS TID: 1.2
Peak HR: 89 {beats}/min
Rest HR: 65 {beats}/min
SDS: 1

## 2017-07-21 MED ORDER — REGADENOSON 0.4 MG/5ML IV SOLN
0.4000 mg | Freq: Once | INTRAVENOUS | Status: AC
  Administered 2017-07-21: 0.4 mg via INTRAVENOUS

## 2017-07-21 MED ORDER — TECHNETIUM TC 99M TETROFOSMIN IV KIT
31.4000 | PACK | Freq: Once | INTRAVENOUS | Status: AC | PRN
  Administered 2017-07-21: 31.4 via INTRAVENOUS
  Filled 2017-07-21: qty 32

## 2017-07-21 MED ORDER — TECHNETIUM TC 99M TETROFOSMIN IV KIT
9.8000 | PACK | Freq: Once | INTRAVENOUS | Status: AC | PRN
  Administered 2017-07-21: 9.8 via INTRAVENOUS
  Filled 2017-07-21: qty 10

## 2017-07-22 ENCOUNTER — Encounter: Payer: Self-pay | Admitting: Internal Medicine

## 2017-07-22 ENCOUNTER — Other Ambulatory Visit: Payer: Self-pay | Admitting: Internal Medicine

## 2017-07-22 DIAGNOSIS — IMO0002 Reserved for concepts with insufficient information to code with codable children: Secondary | ICD-10-CM | POA: Insufficient documentation

## 2017-07-22 DIAGNOSIS — R9431 Abnormal electrocardiogram [ECG] [EKG]: Secondary | ICD-10-CM

## 2017-07-22 DIAGNOSIS — R9430 Abnormal result of cardiovascular function study, unspecified: Secondary | ICD-10-CM | POA: Insufficient documentation

## 2017-07-27 ENCOUNTER — Other Ambulatory Visit: Payer: Self-pay

## 2017-07-27 ENCOUNTER — Ambulatory Visit (HOSPITAL_COMMUNITY): Payer: No Typology Code available for payment source | Attending: Cardiovascular Disease

## 2017-07-27 ENCOUNTER — Ambulatory Visit: Payer: Self-pay | Admitting: *Deleted

## 2017-07-27 VITALS — BP 154/99 | HR 90

## 2017-07-27 DIAGNOSIS — E785 Hyperlipidemia, unspecified: Secondary | ICD-10-CM | POA: Insufficient documentation

## 2017-07-27 DIAGNOSIS — R9430 Abnormal result of cardiovascular function study, unspecified: Secondary | ICD-10-CM

## 2017-07-27 DIAGNOSIS — R0600 Dyspnea, unspecified: Secondary | ICD-10-CM | POA: Insufficient documentation

## 2017-07-27 DIAGNOSIS — R9431 Abnormal electrocardiogram [ECG] [EKG]: Secondary | ICD-10-CM | POA: Diagnosis not present

## 2017-07-27 DIAGNOSIS — I10 Essential (primary) hypertension: Secondary | ICD-10-CM | POA: Diagnosis not present

## 2017-07-27 DIAGNOSIS — I429 Cardiomyopathy, unspecified: Secondary | ICD-10-CM | POA: Diagnosis present

## 2017-07-27 LAB — ECHOCARDIOGRAM COMPLETE

## 2017-07-27 NOTE — Progress Notes (Signed)
Pt requests BP check. Reports he is being evaluated for a "heart problem" after an abnormal ekg. Awaiting echo. Reports extreme fatigue and ShOB on exertion prompted MD visit and ekg. No other sx. Sts he has a auto BP cuff on order. Should receive tomorrow. No further questions.

## 2017-07-28 ENCOUNTER — Encounter: Payer: Self-pay | Admitting: Internal Medicine

## 2017-07-28 ENCOUNTER — Other Ambulatory Visit: Payer: Self-pay | Admitting: Internal Medicine

## 2017-07-28 DIAGNOSIS — I5189 Other ill-defined heart diseases: Secondary | ICD-10-CM | POA: Insufficient documentation

## 2017-07-28 MED ORDER — METHYLPHENIDATE HCL ER (LA) 30 MG PO CP24: 30.0000 mg | ORAL_CAPSULE | ORAL | 0 refills | Status: DC

## 2017-07-28 NOTE — Addendum Note (Signed)
Addended by: [name] on: 07/28/2017 04:19 PM   Modules accepted: Orders

## 2017-07-29 NOTE — Telephone Encounter (Signed)
Notified pt rx ready for pick-up.../lmb 

## 2017-08-10 ENCOUNTER — Ambulatory Visit (INDEPENDENT_AMBULATORY_CARE_PROVIDER_SITE_OTHER): Payer: PRIVATE HEALTH INSURANCE | Admitting: Internal Medicine

## 2017-08-10 ENCOUNTER — Other Ambulatory Visit: Payer: Self-pay | Admitting: Internal Medicine

## 2017-08-10 ENCOUNTER — Encounter: Payer: Self-pay | Admitting: Internal Medicine

## 2017-08-10 VITALS — BP 138/84 | HR 92 | Temp 98.4°F | Resp 16 | Ht 63.0 in | Wt 174.0 lb

## 2017-08-10 DIAGNOSIS — I519 Heart disease, unspecified: Secondary | ICD-10-CM

## 2017-08-10 DIAGNOSIS — I10 Essential (primary) hypertension: Secondary | ICD-10-CM | POA: Diagnosis not present

## 2017-08-10 DIAGNOSIS — E669 Obesity, unspecified: Secondary | ICD-10-CM | POA: Insufficient documentation

## 2017-08-10 DIAGNOSIS — Z23 Encounter for immunization: Secondary | ICD-10-CM

## 2017-08-10 DIAGNOSIS — F900 Attention-deficit hyperactivity disorder, predominantly inattentive type: Secondary | ICD-10-CM

## 2017-08-10 DIAGNOSIS — I5189 Other ill-defined heart diseases: Secondary | ICD-10-CM

## 2017-08-10 MED ORDER — LORCASERIN HCL ER 20 MG PO TB24: 1.0000 | ORAL_TABLET | Freq: Every day | ORAL | 5 refills | Status: DC

## 2017-08-10 MED ORDER — METHYLPHENIDATE HCL ER (LA) 30 MG PO CP24: 30.0000 mg | ORAL_CAPSULE | ORAL | 0 refills | Status: DC

## 2017-08-10 NOTE — Patient Instructions (Signed)

## 2017-08-10 NOTE — Progress Notes (Signed)
Subjective:  Patient ID: Robert Mccall, male    DOB: 1951-10-12  Age: 66 y.o. MRN: [id number]  CC: Hypertension   HPI THEODOR MUSTIN presents for f/up - he recently underwent extensive CV testing for evaluation of DOE and was found to have grade 1 diastolic dysfunction. I made the decision to lower his Ritalin dose in order to keep his blood pressure and pulse down. He is tolerating the lower dose well and tells me today that his DOE has resolved. He is concerned about weight gain and obesity and wants to try medication to help him reduce his caloric intake.  Outpatient Medications Prior to Visit  Medication Sig Dispense Refill  . alfuzosin (UROXATRAL) 10 MG 24 hr tablet TAKE 1 TABLET(10 MG) BY MOUTH DAILY WITH BREAKFAST 90 tablet 1  . aspirin 81 MG tablet Take 81 mg by mouth daily.      . Cholecalciferol (VITAMIN D) 2000 UNITS CAPS Take 1 capsule (2,000 Units total) by mouth 1 dose over 46 hours. 30 capsule 11  . DULoxetine (CYMBALTA) 30 MG capsule TAKE 1 CAPSULE BY MOUTH EVERY DAY 30 capsule 11  . esomeprazole (NEXIUM) 40 MG capsule TAKE 1 CAPSULE(40 MG) BY MOUTH DAILY 90 capsule 1  . finasteride (PROSCAR) 5 MG tablet TAKE 1 TABLET(5 MG) BY MOUTH DAILY 90 tablet 0  . gabapentin (NEURONTIN) 300 MG capsule TAKE 1 CAPSULE(300 MG) BY MOUTH THREE TIMES DAILY (Patient taking differently: 1 tablet in the morning PRN) 270 capsule 1  . hydrochlorothiazide (MICROZIDE) 12.5 MG capsule Take 1 capsule (12.5 mg total) by mouth daily. 90 capsule 1  . KRILL OIL 1000 MG CAPS Take 1,000 mg by mouth daily.     Marland Kitchen lamoTRIgine (LAMICTAL) 25 MG tablet TAKE 3 TABLETS BY MOUTH TWICE DAILY 540 tablet 0  . meclizine (ANTIVERT) 12.5 MG tablet Take 1 tablet (12.5 mg total) by mouth 3 (three) times daily as needed for dizziness. 30 tablet 0  . simvastatin (ZOCOR) 40 MG tablet Take 1 tablet (40 mg total) by mouth every evening. 90 tablet 1  . Testosterone 40.5 MG/2.5GM (1.62%) GEL Place 1 Act onto the skin daily. 2.5 g 5   . verapamil (CALAN-SR) 240 MG CR tablet TAKE 1 TABLET(240 MG) BY MOUTH DAILY 90 tablet 1  . vitamin B-12 (CYANOCOBALAMIN) 1000 MCG tablet Take 1,000 mcg by mouth daily.    . methylphenidate (RITALIN LA) 30 MG 24 hr capsule Take 1 capsule (30 mg total) by mouth every morning. 30 capsule 0   No facility-administered medications prior to visit.     ROS Review of Systems  Constitutional: Positive for unexpected weight change (wt gain). Negative for diaphoresis and fatigue.  HENT: Negative.  Negative for trouble swallowing.   Eyes: Negative.  Negative for visual disturbance.  Respiratory: Negative.  Negative for cough, chest tightness, shortness of breath and wheezing.   Cardiovascular: Negative.  Negative for chest pain, palpitations and leg swelling.  Gastrointestinal: Negative for abdominal pain, constipation, diarrhea, nausea and vomiting.  Endocrine: Negative.   Genitourinary: Negative.   Musculoskeletal: Negative.  Negative for back pain, myalgias and neck pain.  Skin: Negative for color change and rash.  Allergic/Immunologic: Negative.   Neurological: Negative.  Negative for dizziness, weakness and light-headedness.  Hematological: Negative for adenopathy. Does not bruise/bleed easily.  Psychiatric/Behavioral: Positive for decreased concentration. Negative for behavioral problems, dysphoric mood, sleep disturbance and suicidal ideas. The patient is not nervous/anxious and is not hyperactive.     Objective:  BP  138/84 (BP Location: Left Arm, Patient Position: Sitting, Cuff Size: Normal)   Pulse 92   Temp 98.4 F (36.9 C) (Oral)   Resp 16   Ht 5\' 3"  (1.6 m)   Wt 174 lb (78.9 kg)   SpO2 98%   BMI 30.82 kg/m   BP Readings from Last 3 Encounters:  08/10/17 138/84  07/27/17 (!) 154/99  07/13/17 138/82    Wt Readings from Last 3 Encounters:  08/10/17 174 lb (78.9 kg)  07/21/17 176 lb (79.8 kg)  07/13/17 176 lb 12 oz (80.2 kg)    Physical Exam  Constitutional: He is  oriented to person, place, and time. No distress.  HENT:  Mouth/Throat: Oropharynx is clear and moist. No oropharyngeal exudate.  Eyes: Conjunctivae are normal. Right eye exhibits no discharge. Left eye exhibits no discharge. No scleral icterus.  Neck: Normal range of motion. Neck supple. No JVD present. No thyromegaly present.  Cardiovascular: Normal rate, regular rhythm and intact distal pulses.  Exam reveals no gallop and no friction rub.   No murmur heard. Pulmonary/Chest: Effort normal and breath sounds normal. No respiratory distress. He has no wheezes. He has no rales. He exhibits no tenderness.  Abdominal: Soft. Bowel sounds are normal. He exhibits no distension and no mass. There is no tenderness. There is no rebound and no guarding.  Musculoskeletal: Normal range of motion. He exhibits no edema or tenderness.  Lymphadenopathy:    He has no cervical adenopathy.  Neurological: He is alert and oriented to person, place, and time.  Skin: Skin is warm and dry. No rash noted. He is not diaphoretic. No erythema. No pallor.  Psychiatric: He has a normal mood and affect. His behavior is normal. Judgment and thought content normal.  Vitals reviewed.   Lab Results  Component Value Date   WBC 5.8 07/13/2017   HGB 15.3 07/13/2017   HCT 45.0 07/13/2017   PLT 241.0 07/13/2017   GLUCOSE 128 (H) 07/13/2017   CHOL 131 07/13/2017   TRIG 105.0 07/13/2017   HDL 40.40 07/13/2017   LDLCALC 69 07/13/2017   ALT 37 07/13/2017   AST 23 07/13/2017   NA 140 07/13/2017   K 3.5 07/13/2017   CL 102 07/13/2017   CREATININE 0.97 07/13/2017   BUN 17 07/13/2017   CO2 28 07/13/2017   TSH 2.37 07/13/2017   PSA 0.23 07/13/2017   INR 1.04 01/23/2017   HGBA1C 5.5 08/24/2016    No results found.  Assessment & Plan:   Cevin was seen today for hypertension.  Diagnoses and all orders for this visit:  Essential hypertension- his BP is well controlled  Obesity (BMI 30.0-34.9)- in addition ti lifestyle  modifications, he will start Belviq -     Lorcaserin HCl ER (BELVIQ XR) 20 MG TB24; Take 1 tablet by mouth daily.  Attention deficit hyperactivity disorder (ADHD), predominantly inattentive type- his BP and pulse are normal now and he is doing well on this dose, will continue -     Discontinue: methylphenidate (RITALIN LA) 30 MG 24 hr capsule; Take 1 capsule (30 mg total) by mouth every morning. -     Discontinue: methylphenidate (RITALIN LA) 30 MG 24 hr capsule; Take 1 capsule (30 mg total) by mouth every morning. -     methylphenidate (RITALIN LA) 30 MG 24 hr capsule; Take 1 capsule (30 mg total) by mouth every morning.  Need for influenza vaccination -     Flu vaccine HIGH DOSE PF (Fluzone High dose)  Diastolic dysfunction without heart failure- he has a normal fluid status, will continue to control the BP and HR   I am having Mr. Tubby start on Lorcaserin HCl ER. I am also having him maintain his aspirin, Krill Oil, Vitamin D, meclizine, DULoxetine, hydrochlorothiazide, simvastatin, gabapentin, vitamin B-12, esomeprazole, alfuzosin, verapamil, Testosterone, lamoTRIgine, finasteride, and methylphenidate.  Meds ordered this encounter  Medications  . Lorcaserin HCl ER (BELVIQ XR) 20 MG TB24    Sig: Take 1 tablet by mouth daily.    Dispense:  30 tablet    Refill:  5  . DISCONTD: methylphenidate (RITALIN LA) 30 MG 24 hr capsule    Sig: Take 1 capsule (30 mg total) by mouth every morning.    Dispense:  30 capsule    Refill:  0  . DISCONTD: methylphenidate (RITALIN LA) 30 MG 24 hr capsule    Sig: Take 1 capsule (30 mg total) by mouth every morning.    Dispense:  30 capsule    Refill:  0  . methylphenidate (RITALIN LA) 30 MG 24 hr capsule    Sig: Take 1 capsule (30 mg total) by mouth every morning.    Dispense:  30 capsule    Refill:  0     Follow-up: Return in about 3 months (around 11/09/2017).  Scarlette Calico, MD

## 2017-08-19 ENCOUNTER — Encounter: Payer: Self-pay | Admitting: Hematology and Oncology

## 2017-08-20 ENCOUNTER — Other Ambulatory Visit (HOSPITAL_BASED_OUTPATIENT_CLINIC_OR_DEPARTMENT_OTHER): Payer: PRIVATE HEALTH INSURANCE

## 2017-08-20 DIAGNOSIS — D472 Monoclonal gammopathy: Secondary | ICD-10-CM

## 2017-08-23 LAB — KAPPA/LAMBDA LIGHT CHAINS
Ig Kappa Free Light Chain: 13.2 mg/L (ref 3.3–19.4)
Ig Lambda Free Light Chain: 16.2 mg/L (ref 5.7–26.3)
Kappa/Lambda FluidC Ratio: 0.81 (ref 0.26–1.65)

## 2017-08-24 LAB — MULTIPLE MYELOMA PANEL, SERUM
ALBUMIN/GLOB SERPL: 1.2 (ref 0.7–1.7)
ALPHA2 GLOB SERPL ELPH-MCNC: 0.8 g/dL (ref 0.4–1.0)
Albumin SerPl Elph-Mcnc: 3.9 g/dL (ref 2.9–4.4)
Alpha 1: 0.2 g/dL (ref 0.0–0.4)
B-GLOBULIN SERPL ELPH-MCNC: 1 g/dL (ref 0.7–1.3)
Gamma Glob SerPl Elph-Mcnc: 1.2 g/dL (ref 0.4–1.8)
Globulin, Total: 3.3 g/dL (ref 2.2–3.9)
IgA, Qn, Serum: 168 mg/dL (ref 61–437)
IgM, Qn, Serum: 157 mg/dL (ref 20–172)
M Protein SerPl Elph-Mcnc: 0.4 g/dL — ABNORMAL HIGH
Total Protein: 7.2 g/dL (ref 6.0–8.5)

## 2017-08-26 ENCOUNTER — Encounter: Payer: Self-pay | Admitting: Hematology and Oncology

## 2017-08-26 ENCOUNTER — Ambulatory Visit (HOSPITAL_BASED_OUTPATIENT_CLINIC_OR_DEPARTMENT_OTHER): Payer: PRIVATE HEALTH INSURANCE | Admitting: Hematology and Oncology

## 2017-08-26 ENCOUNTER — Telehealth: Payer: Self-pay | Admitting: Hematology and Oncology

## 2017-08-26 DIAGNOSIS — D472 Monoclonal gammopathy: Secondary | ICD-10-CM

## 2017-08-26 DIAGNOSIS — G620 Drug-induced polyneuropathy: Secondary | ICD-10-CM

## 2017-08-26 DIAGNOSIS — E538 Deficiency of other specified B group vitamins: Secondary | ICD-10-CM | POA: Diagnosis not present

## 2017-08-26 DIAGNOSIS — G63 Polyneuropathy in diseases classified elsewhere: Secondary | ICD-10-CM

## 2017-08-26 NOTE — Telephone Encounter (Signed)
Gave patient avs report and appointments for September 2019

## 2017-08-26 NOTE — Progress Notes (Signed)
[facility] OFFICE PROGRESS NOTE  Patient Care Team: [name], MD as PCP - General (Internal Medicine) Inda Castle, MD (Gastroenterology) Camillo Flaming, Sebastian (Optometry) Carolan Clines, MD as Consulting Physician (Urology) Rozetta Nunnery, MD (Otolaryngology)  SUMMARY OF ONCOLOGIC HISTORY:  The patient was feeling unwell with sensation of dizziness. He was referred to neurologist for further evaluation. Incidentally, during his physical examination, he also noted history of reduced sensation in his feet and numbness at the end of toes.  The patient has history of chronic back pain and was treated with gabapentin for his back pain. His neurologist perform significant workup including blood work which showed IgG lambda MGUS. He is being referred here for further evaluation He denies history of abnormal bone pain or bone fracture. Patient denies history of recurrent infection or atypical infections such as shingles of meningitis. Denies chills, night sweats, anorexia or abnormal weight loss. Repeat blood work and skeletal survey in September 2017 show no evidence of end organ damage  He was recommended observation He was also noted to have borderline B12 deficiency and was recommended oral vitamin B12 replacement therapy  INTERVAL HISTORY: Please see below for problem oriented charting. He feels well He denies alcohol intake He denies worsening peripheral neuropathy, weakness or falls No recent infection He denies bone pain  REVIEW OF SYSTEMS:   Constitutional: Denies fevers, chills or abnormal weight loss Eyes: Denies blurriness of vision Ears, nose, mouth, throat, and face: Denies mucositis or sore throat Respiratory: Denies cough, dyspnea or wheezes Cardiovascular: Denies palpitation, chest discomfort or lower extremity swelling Gastrointestinal:  Denies nausea, heartburn or change in bowel habits Skin: Denies abnormal skin rashes Lymphatics:  Denies new lymphadenopathy or easy bruising Neurological:Denies numbness, tingling or new weaknesses Behavioral/Psych: Mood is stable, no new changes  All other systems were reviewed with the patient and are negative.  I have reviewed the past medical history, past surgical history, social history and family history with the patient and they are unchanged from previous note.  ALLERGIES:  has No Known Allergies.  MEDICATIONS:  Current Outpatient Prescriptions  Medication Sig Dispense Refill  . alfuzosin (UROXATRAL) 10 MG 24 hr tablet TAKE 1 TABLET(10 MG) BY MOUTH DAILY WITH BREAKFAST 90 tablet 1  . aspirin 81 MG tablet Take 81 mg by mouth daily.      . Cholecalciferol (VITAMIN D) 2000 UNITS CAPS Take 1 capsule (2,000 Units total) by mouth 1 dose over 46 hours. 30 capsule 11  . DULoxetine (CYMBALTA) 30 MG capsule TAKE 1 CAPSULE BY MOUTH EVERY DAY 30 capsule 11  . esomeprazole (NEXIUM) 40 MG capsule TAKE 1 CAPSULE(40 MG) BY MOUTH DAILY 90 capsule 1  . finasteride (PROSCAR) 5 MG tablet TAKE 1 TABLET(5 MG) BY MOUTH DAILY 90 tablet 0  . gabapentin (NEURONTIN) 300 MG capsule TAKE 1 CAPSULE(300 MG) BY MOUTH THREE TIMES DAILY (Patient taking differently: 1 tablet in the morning PRN) 270 capsule 1  . hydrochlorothiazide (MICROZIDE) 12.5 MG capsule Take 1 capsule (12.5 mg total) by mouth daily. 90 capsule 1  . KRILL OIL 1000 MG CAPS Take 1,000 mg by mouth daily.     Marland Kitchen lamoTRIgine (LAMICTAL) 25 MG tablet TAKE 3 TABLETS BY MOUTH TWICE DAILY 540 tablet 0  . Lorcaserin HCl ER (BELVIQ XR) 20 MG TB24 Take 1 tablet by mouth daily. 30 tablet 5  . meclizine (ANTIVERT) 12.5 MG tablet Take 1 tablet (12.5 mg total) by mouth 3 (three) times daily as needed for dizziness.  30 tablet 0  . methylphenidate (RITALIN LA) 30 MG 24 hr capsule Take 1 capsule (30 mg total) by mouth every morning. 30 capsule 0  . simvastatin (ZOCOR) 40 MG tablet Take 1 tablet (40 mg total) by mouth every evening. 90 tablet 1  . Testosterone  40.5 MG/2.5GM (1.62%) GEL Place 1 Act onto the skin daily. 2.5 g 5  . verapamil (CALAN-SR) 240 MG CR tablet TAKE 1 TABLET(240 MG) BY MOUTH DAILY 90 tablet 1  . vitamin B-12 (CYANOCOBALAMIN) 1000 MCG tablet Take 1,000 mcg by mouth daily.     No current facility-administered medications for this visit.     PHYSICAL EXAMINATION: ECOG PERFORMANCE STATUS: 0 - Asymptomatic  Vitals:   08/26/17 1009  BP: (!) 149/85  Pulse: (!) 102  Resp: 12  Temp: 98.4 F (36.9 C)  SpO2: 97%   Filed Weights   08/26/17 1009  Weight: 175 lb 9.6 oz (79.7 kg)    GENERAL:alert, no distress and comfortable SKIN: skin color, texture, turgor are normal, no rashes or significant lesions EYES: normal, Conjunctiva are pink and non-injected, sclera clear OROPHARYNX:no exudate, no erythema and lips, buccal mucosa, and tongue normal  NECK: supple, thyroid normal size, non-tender, without nodularity LYMPH:  no palpable lymphadenopathy in the cervical, axillary or inguinal LUNGS: clear to auscultation and percussion with normal breathing effort HEART: regular rate & rhythm and no murmurs and no lower extremity edema ABDOMEN:abdomen soft, non-tender and normal bowel sounds Musculoskeletal:no cyanosis of digits and no clubbing  NEURO: alert & oriented x 3 with fluent speech, no focal motor/sensory deficits  LABORATORY DATA:  I have reviewed the data as listed    Component Value Date/Time   NA 140 07/13/2017 0933   NA 140 08/18/2016 0953   K 3.5 07/13/2017 0933   K 3.5 08/18/2016 0953   CL 102 07/13/2017 0933   CO2 28 07/13/2017 0933   CO2 26 08/18/2016 0953   GLUCOSE 128 (H) 07/13/2017 0933   GLUCOSE 116 08/18/2016 0953   BUN 17 07/13/2017 0933   BUN 17.2 08/18/2016 0953   CREATININE 0.97 07/13/2017 0933   CREATININE 1.1 08/18/2016 0953   CALCIUM 9.3 07/13/2017 0933   CALCIUM 9.2 08/18/2016 0953   PROT 7.2 08/20/2017 1102   PROT 7.6 08/18/2016 0953   ALBUMIN 4.6 07/13/2017 0933   ALBUMIN 4.4  07/13/2017 0933   ALBUMIN 3.9 08/18/2016 0953   AST 23 07/13/2017 0933   AST 21 08/18/2016 0953   ALT 37 07/13/2017 0933   ALT 24 08/18/2016 0953   ALKPHOS 67 07/13/2017 0933   ALKPHOS 59 08/18/2016 0953   BILITOT 0.3 07/13/2017 0933   BILITOT 0.95 08/18/2016 0953   GFRNONAA >60 01/23/2017 1227   GFRAA >60 01/23/2017 1227    No results found for: SPEP, UPEP  Lab Results  Component Value Date   WBC 5.8 07/13/2017   NEUTROABS 2.6 07/13/2017   HGB 15.3 07/13/2017   HCT 45.0 07/13/2017   MCV 88.9 07/13/2017   PLT 241.0 07/13/2017      Chemistry      Component Value Date/Time   NA 140 07/13/2017 0933   NA 140 08/18/2016 0953   K 3.5 07/13/2017 0933   K 3.5 08/18/2016 0953   CL 102 07/13/2017 0933   CO2 28 07/13/2017 0933   CO2 26 08/18/2016 0953   BUN 17 07/13/2017 0933   BUN 17.2 08/18/2016 0953   CREATININE 0.97 07/13/2017 0933   CREATININE 1.1 08/18/2016 0953   GLU  102 08/02/2015      Component Value Date/Time   CALCIUM 9.3 07/13/2017 0933   CALCIUM 9.2 08/18/2016 0953   ALKPHOS 67 07/13/2017 0933   ALKPHOS 59 08/18/2016 0953   AST 23 07/13/2017 0933   AST 21 08/18/2016 0953   ALT 37 07/13/2017 0933   ALT 24 08/18/2016 0953   BILITOT 0.3 07/13/2017 0933   BILITOT 0.95 08/18/2016 0953      ASSESSMENT & PLAN:  MGUS (monoclonal gammopathy of unknown significance) The patient is noted to have IgG lambda MGUS. According to his recent blood work, and there were nothing to suggest end organ damage. 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  It is unlikely related to MGUS I noted that his serum vitamin B-12 was borderline and he is started appropriately on vitamin B-12 supplement. For  now, he is relatively asymptomatic. He is already on gabapentin for this. Continue same treatment for now  B12 deficiency His last set of blood work showed mild borderline B12 deficiency He is taking oral B12 supplement I plan to recheck B12 level next year to ensure adequate replacement   Orders Placed This Encounter  Procedures  . Comprehensive metabolic panel    Standing Status:   Future    Standing Expiration Date:   09/30/2018  . CBC with Differential/Platelet    Standing Status:   Future    Standing Expiration Date:   09/30/2018  . Kappa/lambda light chains    Standing Status:   Future    Standing Expiration Date:   09/30/2018  . Multiple Myeloma Panel (SPEP&IFE w/QIG)    Standing Status:   Future    Standing Expiration Date:   09/30/2018  . Vitamin B12    Standing Status:   Future    Standing Expiration Date:   09/30/2018   All questions were answered. The patient knows to call the clinic with any problems, questions or concerns. No barriers to learning was detected. I spent 15 minutes counseling the patient face to face. The total time spent in the appointment was 20 minutes and more than 50% was on counseling and review of test results     Robert Lark, MD 08/26/2017 11:24 AM

## 2017-08-26 NOTE — Assessment & Plan Note (Signed)
The patient has peripheral neuropathy of unknown reason.  It is unlikely related to MGUS I noted that his serum vitamin B-12 was borderline and he is started appropriately on vitamin B-12 supplement. For now, he is relatively asymptomatic. He is already on gabapentin for this. Continue same treatment for now

## 2017-08-26 NOTE — Assessment & Plan Note (Addendum)
The patient is noted to have IgG lambda MGUS. According to his recent blood work, and there were nothing to suggest end organ damage. So far, he has no signs of disease progression I spent a lot of time educating the patient the natural history of MGUS I will see him once a year with repeat labs and examination

## 2017-08-26 NOTE — Assessment & Plan Note (Signed)
His last set of blood work showed mild borderline B12 deficiency He is taking oral B12 supplement I plan to recheck B12 level next year to ensure adequate replacement

## 2017-08-27 ENCOUNTER — Ambulatory Visit: Payer: PRIVATE HEALTH INSURANCE | Admitting: Hematology and Oncology

## 2017-08-31 ENCOUNTER — Ambulatory Visit: Payer: Self-pay | Admitting: *Deleted

## 2017-08-31 VITALS — BP 139/93 | HR 96 | Ht 64.0 in | Wt 174.0 lb

## 2017-08-31 DIAGNOSIS — Z0000 Encounter for general adult medical examination without abnormal findings: Secondary | ICD-10-CM

## 2017-08-31 DIAGNOSIS — R7989 Other specified abnormal findings of blood chemistry: Secondary | ICD-10-CM

## 2017-08-31 NOTE — Progress Notes (Signed)
[employer] insurance premium discount evaluation: Labs Drawn. Replacements ROI form signed. Tobacco Free Attestation form signed.  Forms placed in paper chart.  Plan to recheck BP at review appt. Pt reports with "recent heart dysfunction diagnosis", his BPs have been variable when checking it multiple times a day at home.   Okay to route results to pcp per pt.

## 2017-09-01 LAB — CMP12+LP+TP+TSH+6AC+PSA+CBC…
ALBUMIN: 4.6 g/dL (ref 3.6–4.8)
ALT: 35 IU/L (ref 0–44)
AST: 28 IU/L (ref 0–40)
Albumin/Globulin Ratio: 1.9 (ref 1.2–2.2)
Alkaline Phosphatase: 64 IU/L (ref 39–117)
BUN / CREAT RATIO: 15 (ref 10–24)
BUN: 16 mg/dL (ref 8–27)
Basophils Absolute: 0 10*3/uL (ref 0.0–0.2)
Basos: 0 %
Bilirubin Total: 0.4 mg/dL (ref 0.0–1.2)
CALCIUM: 9.1 mg/dL (ref 8.6–10.2)
CHLORIDE: 99 mmol/L (ref 96–106)
CHOLESTEROL TOTAL: 125 mg/dL (ref 100–199)
CREATININE: 1.08 mg/dL (ref 0.76–1.27)
Chol/HDL Ratio: 3.2 ratio (ref 0.0–5.0)
EOS (ABSOLUTE): 0.2 10*3/uL (ref 0.0–0.4)
EOS: 3 %
FREE THYROXINE INDEX: 2.1 (ref 1.2–4.9)
GFR, EST AFRICAN AMERICAN: 82 mL/min/{1.73_m2} (ref >59)
GFR, EST NON AFRICAN AMERICAN: 71 mL/min/{1.73_m2} (ref >59)
GGT: 28 IU/L (ref 0–65)
GLUCOSE: 104 mg/dL — AB (ref 65–99)
Globulin, Total: 2.4 g/dL (ref 1.5–4.5)
HDL: 39 mg/dL — ABNORMAL LOW (ref >39)
Hematocrit: 43.8 % (ref 37.5–51.0)
Hemoglobin: 14.8 g/dL (ref 13.0–17.7)
IMMATURE GRANS (ABS): 0 10*3/uL (ref 0.0–0.1)
IRON: 79 ug/dL (ref 38–169)
Immature Granulocytes: 0 %
LDH: 180 IU/L (ref 121–224)
LDL Calculated: 62 mg/dL (ref 0–99)
Lymphocytes Absolute: 2.6 10*3/uL (ref 0.7–3.1)
Lymphs: 44 %
MCH: 29.8 pg (ref 26.6–33.0)
MCHC: 33.8 g/dL (ref 31.5–35.7)
MCV: 88 fL (ref 79–97)
MONOCYTES: 12 %
Monocytes Absolute: 0.7 10*3/uL (ref 0.1–0.9)
NEUTROS ABS: 2.4 10*3/uL (ref 1.4–7.0)
Neutrophils: 41 %
PHOSPHORUS: 2.1 mg/dL — AB (ref 2.5–4.5)
PLATELETS: 229 10*3/uL (ref 150–379)
Potassium: 4.1 mmol/L (ref 3.5–5.2)
Prostate Specific Ag, Serum: 0.4 ng/mL (ref 0.0–4.0)
RBC: 4.97 x10E6/uL (ref 4.14–5.80)
RDW: 13.9 % (ref 12.3–15.4)
Sodium: 138 mmol/L (ref 134–144)
T3 UPTAKE RATIO: 26 % (ref 24–39)
T4, Total: 8 ug/dL (ref 4.5–12.0)
TOTAL PROTEIN: 7 g/dL (ref 6.0–8.5)
TSH: 3.21 u[IU]/mL (ref 0.450–4.500)
Triglycerides: 120 mg/dL (ref 0–149)
URIC ACID: 6.1 mg/dL (ref 3.7–8.6)
VLDL CHOLESTEROL CAL: 24 mg/dL (ref 5–40)
WBC: 6 10*3/uL (ref 3.4–10.8)

## 2017-09-01 LAB — VITAMIN D 25 HYDROXY (VIT D DEFICIENCY, FRACTURES): Vit D, 25-Hydroxy: 38.8 ng/mL (ref 30.0–100.0)

## 2017-09-01 LAB — HGB A1C W/O EAG: HEMOGLOBIN A1C: 5.5 % (ref 4.8–5.6)

## 2017-09-02 NOTE — Progress Notes (Signed)
Pt reviewed lab results and NP notes in [place]. Denied any questions or concerns. Results routed to pcp per pt request.

## 2017-09-03 ENCOUNTER — Encounter: Payer: Self-pay | Admitting: Internal Medicine

## 2017-09-15 ENCOUNTER — Ambulatory Visit (INDEPENDENT_AMBULATORY_CARE_PROVIDER_SITE_OTHER): Payer: No Typology Code available for payment source | Admitting: Family Medicine

## 2017-09-15 ENCOUNTER — Encounter: Payer: Self-pay | Admitting: Family Medicine

## 2017-09-15 VITALS — BP 134/64 | HR 100 | Temp 98.0°F | Ht 64.0 in | Wt 173.0 lb

## 2017-09-15 DIAGNOSIS — R42 Dizziness and giddiness: Secondary | ICD-10-CM | POA: Diagnosis not present

## 2017-09-15 NOTE — Patient Instructions (Signed)
Thank you for coming in,   Please take meclizine if this starts up again.   I have made a referral to physical therapy.    Please feel free to call with any questions or concerns at any time, at [phone]. --Dr. Raeford Razor How to Perform the Epley Maneuver The Epley maneuver is an exercise that relieves symptoms of vertigo. Vertigo is the feeling that you or your surroundings are moving when they are not. When you feel vertigo, you may feel like the room is spinning and have trouble walking. Dizziness is a little different than vertigo. When you are dizzy, you may feel unsteady or light-headed. You can do this maneuver at home whenever you have symptoms of vertigo. You can do it up to 3 times a day until your symptoms go away. Even though the Epley maneuver may relieve your vertigo for a few weeks, it is possible that your symptoms will return. This maneuver relieves vertigo, but it does not relieve dizziness. What are the risks? If it is done correctly, the Epley maneuver is considered safe. Sometimes it can lead to dizziness or nausea that goes away after a short time. If you develop other symptoms, such as changes in vision, weakness, or numbness, stop doing the maneuver and call your health care provider. How to perform the Epley maneuver 1. Sit on the edge of a bed or table with your back straight and your legs extended or hanging over the edge of the bed or table. 2. Turn your head halfway toward the affected ear or side. 3. Lie backward quickly with your head turned until you are lying flat on your back. You may want to position a pillow under your shoulders. 4. Hold this position for 30 seconds. You may experience an attack of vertigo. This is normal. 5. Turn your head to the opposite direction until your unaffected ear is facing the floor. 6. Hold this position for 30 seconds. You may experience an attack of vertigo. This is normal. Hold this position until the vertigo stops. 7. Turn  your whole body to the same side as your head. Hold for another 30 seconds. 8. Sit back up. You can repeat this exercise up to 3 times a day. Follow these instructions at home:  After doing the Epley maneuver, you can return to your normal activities.  Ask your health care provider if there is anything you should do at home to prevent vertigo. He or she may recommend that you: ? Keep your head raised (elevated) with two or more pillows while you sleep. ? Do not sleep on the side of your affected ear. ? Get up slowly from bed. ? Avoid sudden movements during the day. ? Avoid extreme head movement, like looking up or bending over. Contact a health care provider if:  Your vertigo gets worse.  You have other symptoms, including: ? Nausea. ? Vomiting. ? Headache. Get help right away if:  You have vision changes.  You have a severe or worsening headache or neck pain.  You cannot stop vomiting.  You have new numbness or weakness in any part of your body. Summary  Vertigo is the feeling that you or your surroundings are moving when they are not.  The Epley maneuver is an exercise that relieves symptoms of vertigo.  If the Epley maneuver is done correctly, it is considered safe. You can do it up to 3 times a day. This information is not intended to replace advice given to you by  your health care provider. Make sure you discuss any questions you have with your health care provider. Document Released: 11/28/2013 Document Revised: 10/13/2016 Document Reviewed: 10/13/2016 Elsevier Interactive Patient Education  2017 [employer].

## 2017-09-15 NOTE — Progress Notes (Signed)
Robert Mccall - 66 y.o. male MRN [id number]  Date of birth: 08-08-1951  SUBJECTIVE:  Including CC & ROS.  Chief Complaint  Patient presents with  . Dizziness    started 09/10/17-lasted about 3 hours-he vomitted and was sweating-improved after laying down    [name] is a 66 year old male that is presenting with vertigo. He reports that 2 days ago he was at work and had some room spinning sensation. He became nauseated and had some emesis. He had to go lie down in his car until his symptoms subsided. He had a history of similar symptoms and has taken meclizine in the past. He is not currently taking any medications for these symptoms. He has not had any formal physical therapy previously. He had another episode the did not last as long as his initial episode. Denies any lightheadedness. No shortness of breath.   CT of his head from February 2018 shows no intracranial process. Review of his blood work from 08/31/17 shows normal sodium and kidney function  Review of Systems  Constitutional: Negative for fever.  Respiratory: Negative for shortness of breath.   Neurological: Positive for dizziness and numbness. Negative for weakness.    HISTORY: Past Medical, Surgical, Social, and Family History Reviewed & Updated per EMR.   Pertinent Historical Findings include:  Past Medical History:  Diagnosis Date  . ADHD (attention deficit hyperactivity disorder)   . Anxiety   . Benign prostatic hypertrophy   . Chronic lower back pain    surgery schedule for back 10/21/16  . Cyst of right kidney    incidental Bosiak 1.3cm on R (MRI abd 09/2014), follows with uro  . Depression   . GERD (gastroesophageal reflux disease)   . Glaucoma   . History of migraine headaches    no meds  . Hyperlipidemia   . Hypertension     Past Surgical History:  Procedure Laterality Date  . COLONOSCOPY  09/2007   polyps/ Deatra Ina  . HYDROCELE EXCISION Right 1990  . LUMBAR DISC SURGERY  10/21/2016  . ORCHIECTOMY  Left 1970  . WISDOM TOOTH EXTRACTION      No Known Allergies  Family History  Problem Relation Age of Onset  . COPD Mother   . Heart disease Mother   . Breast cancer Mother   . Osteoarthritis Mother        s/p B TKR  . Depression Mother   . Macular degeneration Father   . Diabetes Father   . Colon cancer Neg Hx   . Esophageal cancer Neg Hx   . Rectal cancer Neg Hx   . Stomach cancer Neg Hx      Social History   Social History  . Marital status: Single    Spouse name: N/A  . Number of children: N/A  . Years of education: N/A   Occupational History  . Consulting civil engineer   Social History Main Topics  . Smoking status: Never Smoker  . Smokeless tobacco: Never Used  . Alcohol use Yes     Comment: 1-2 glasses wine per day  . Drug use: No  . Sexual activity: Yes     Comment: working with replacement   Other Topics Concern  . Not on file   Social History Narrative  . No narrative on file     PHYSICAL EXAM:  VS: BP 134/64 (BP Location: Left Arm, Patient Position: Sitting, Cuff Size: Normal)   Pulse 100   Temp 98  F (36.7 C) (Oral)   Ht 5\' 4"  (1.626 m)   Wt 173 lb (78.5 kg)   SpO2 99%   BMI 29.70 kg/m  Physical Exam Gen: NAD, alert, cooperative with exam, well-appearing ENT: normal lips, normal nasal mucosa,  Eye: normal EOM, normal conjunctiva and lids, pupils equal and reactive CV:  no edema, +2 pedal pulses, S1-S2  ,  Resp: no accessory muscle use, non-labored, clear to auscultation bilaterally  Skin: no rashes, no areas of induration  Neuro: normal tone, normal sensation to touch, no exacerbation of symptoms with saccades testing are smooth pursuit Psych:  normal insight, alert and oriented MSK: Normal gait, normal strength       ASSESSMENT & PLAN:   I spent 25 minutes with this patient, greater than 50% was face-to-face time counseling regarding the below diagnosis.   Vertigo Most likely BPPV. Possible for Mnire's  disease as his initial episode lasted for several hours but denies any hearing loss or tinnitus. - Referral to physical therapy for vestibular ocular rehabilitation - Can use the left upper meclizine that he has - Counseled on how to perform the Epley maneuver and counseled on when to seek immediate care.

## 2017-09-15 NOTE — Assessment & Plan Note (Signed)
Most likely BPPV. Possible for Mnire's disease as his initial episode lasted for several hours but denies any hearing loss or tinnitus. - Referral to physical therapy for vestibular ocular rehabilitation - Can use the left upper meclizine that he has - Counseled on how to perform the Epley maneuver and counseled on when to seek immediate care.

## 2017-09-17 ENCOUNTER — Encounter: Payer: Self-pay | Admitting: Family Medicine

## 2017-09-29 ENCOUNTER — Encounter: Payer: Self-pay | Admitting: Internal Medicine

## 2017-09-30 ENCOUNTER — Ambulatory Visit: Payer: No Typology Code available for payment source | Attending: Family Medicine | Admitting: Physical Therapy

## 2017-09-30 ENCOUNTER — Encounter: Payer: Self-pay | Admitting: Physical Therapy

## 2017-09-30 ENCOUNTER — Encounter: Payer: Self-pay | Admitting: Internal Medicine

## 2017-09-30 DIAGNOSIS — R42 Dizziness and giddiness: Secondary | ICD-10-CM | POA: Insufficient documentation

## 2017-09-30 NOTE — Therapy (Signed)
[facility] [address] [place], [place], [address] Phone: [phone]   Fax:  [phone]  Physical Therapy Evaluation  Patient Details  Name: Robert Mccall MRN: [id number] Date of Birth: 01-Jul-1951 Referring Provider: Clearance Coots MD  Encounter Date: 09/30/2017      PT End of Session - 09/30/17 1903    Visit Number 1   Number of Visits 3   Date for PT Re-Evaluation 11/05/17   Authorization Type ? [employer]   PT Start Time 0932   PT Stop Time 1018   PT Time Calculation (min) 46 min   Activity Tolerance Patient tolerated treatment well   Behavior During Therapy WFL for tasks assessed/performed      Past Medical History:  Diagnosis Date  . ADHD (attention deficit hyperactivity disorder)   . Anxiety   . Benign prostatic hypertrophy   . Chronic lower back pain    surgery schedule for back 10/21/16  . Cyst of right kidney    incidental Bosiak 1.3cm on R (MRI abd 09/2014), follows with uro  . Depression   . GERD (gastroesophageal reflux disease)   . Glaucoma   . History of migraine headaches    no meds  . Hyperlipidemia   . Hypertension     Past Surgical History:  Procedure Laterality Date  . COLONOSCOPY  09/2007   polyps/ Deatra Ina  . HYDROCELE EXCISION Right 1990  . LUMBAR DISC SURGERY  10/21/2016  . ORCHIECTOMY Left 1970  . WISDOM TOOTH EXTRACTION      There were no vitals filed for this visit.       Subjective Assessment - 09/30/17 0938    Subjective I had a dizzy spell at work where I was off balance and couldn't walk well (~1 year ago). Then a month ago I had an episode with severe, fast spinning x 1.5 hours +vomiting. Laid down for ~1.5 hrs with symptoms finally going away. Was left with a headache. Had another similar episode 09/10/17 lasting hours, not quite as severe but had to lie down and when stopped spinning was left with a headache. Reports he has bil tinnitus and had for years, even  before he got hearing aids. Has been told he has a "diastolic dysfunction" and should limit his fluid intake.    Pertinent History h/o visual migraines (no meds ever taken and had none in recent years)   Patient Stated Goals Understand what is causing the spinning and reduce it   Currently in Pain? No/denies            [facility] PT Assessment - 09/30/17 1850      Assessment   Medical Diagnosis vertigo   Referring Provider Clearance Coots MD   Onset Date/Surgical Date 09/02/17   Prior Therapy none     Precautions   Precautions Fall   Precaution Comments during vertigo episode     Balance Screen   Has the patient fallen in the past 6 months No   Has the patient had a decrease in activity level because of a fear of falling?  No   Is the patient reluctant to leave their home because of a fear of falling?  No     Prior Function   Level of Independence Independent   Vocation Full time employment;Part time employment  not specified which     Cognition   Overall Cognitive Status Within Functional Limits for tasks assessed     Observation/Other Assessments   Focus on Therapeutic  Outcomes (FOTO)  FS 75; risk adjusted FS 55   Dizziness Handicap Inventory (DHI)  16     Sensation   Light Touch Impaired by gross assessment  h/o peripheral neuropathhy     ROM / Strength   AROM / PROM / Strength AROM;Strength     AROM   Overall AROM  Within functional limits for tasks performed     Strength   Overall Strength Within functional limits for tasks performed   Overall Strength Comments bil ankle DF 5/5     Ambulation/Gait   Ambulation/Gait Yes   Ambulation/Gait Assistance 7: Independent   Ambulation Distance (Feet) 100 Feet   Assistive device None   Ambulation Surface Level            Vestibular Assessment - 09/30/17 0001      Vestibular Assessment   General Observation See subjective     Symptom Behavior   Type of Dizziness Spinning   Frequency of Dizziness 4 times in  past several months   Duration of Dizziness 15 minutes to 3+ hours   Aggravating Factors No known aggravating factors   Relieving Factors Lying supine;Rest     Occulomotor Exam   Occulomotor Alignment Abnormal  left eye slightly elevated   Spontaneous Absent   Gaze-induced Absent   Smooth Pursuits Saccades   Saccades Dysmetria   Comment Pt reports mild vertical diplopia present at all times.     Vestibulo-Occular Reflex   Comment HIT +bil but more severe to right     Visual Acuity   Static 7   Dynamic 3     Auditory   Comments bil hearing aids for "years"; bil tinnitus since before he got hearing aids     Cognition   Cognition Orientation Level Oriented x 4        Objective measurements completed on examination: See above findings.                  PT Education - 09/30/17 1901    Education provided Yes   Education Details anatomy of balance systems; peripheral vs central potential causes of vertigo; symptoms not indicative of BPPV, most consistent with Meniere's disease with hypofunction; VORx1 exercise   Person(s) Educated Patient   Methods Explanation;Demonstration;Handout   Comprehension Verbalized understanding;Need further instruction          PT Short Term Goals - 09/30/17 1939      PT SHORT TERM GOAL #1   Title Patient will be independent with basic VORx1 exercises and able to verbalize how to progress complexity of exercise as he improves. (Target by 10/08/17)   Time 1   Period Weeks   Status New     PT SHORT TERM GOAL #2   Title Patient will complete SOT for baseline measure of effective use/strength of vestibular system.    Time 1   Period Weeks   Status New           PT Long Term Goals - 09/30/17 1939      PT LONG TERM GOAL #1   Title Patient will be independent with updated HEP for vestibular rehab. (Target for LTGs 11/05/17)   Time 5   Period Weeks   Status New     PT LONG TERM GOAL #2   Title Patient will repeat SOT  with improvement in use of vestibular system demonstrated.    Time 5   Period Weeks   Status New  Plan - 09/30/17 1907    Clinical Impression Statement Patient presents with h/o 4 episodes of spontaneous vertigo (spinning) lasting 15 minutes to 3+ hours in the past several months. He has longstanding h/o bil tinnitus and bil hearing aids. Vertigo is not motion-induced (reports most severe episodes came on while seated at his desk). Symptoms seem most consistent with Meniere's disease and recommended to patient he get a referral to an ENT for a vestibular assessment due to vertigo as PT cannot diagnose Meniere's disease. Patient also has peripheral hypofunction as evidenced by + HIT to right and dynamic visual acuity 4 lines less than static visual acuity. Anticipate patient will need medical management if found to have Meniere's disease, however can benefit from vestibular rehab to address the peripheral hypofunction. Will plan to perform SOT and educate on advancing VOR exercises at next visit. Will then have pt return after 4 weeks to assess effectiveness of gaze stabilization exercises.    History and Personal Factors relevant to plan of care: PMH-visual migraines (reports none in recent years); bil hearing loss, bil tinnitus, HTN, glaucoma, MGUS with peripheral neuropathy, anxiety; Personal factors-behavioral/emotional responses   Clinical Presentation Unstable   Clinical Presentation due to: spontaneous attacks of severe vertigo (incapacitating, unable to steand/walk)   Clinical Decision Making High   Rehab Potential Fair  due to need for medical diagnosis/management   Clinical Impairments Affecting Rehab Potential peripheral neuropathy, diplopia, anxiety, h/o migraines   PT Frequency Other (comment)  1x/wk for one week; then 1x/wk in 4 weeks   PT Duration Other (comment)  5 weeks   PT Treatment/Interventions ADLs/Self Care Home Management;DME Instruction;Therapeutic  activities;Therapeutic exercise;Balance training;Neuromuscular re-education;Patient/family education;Vestibular;Visual/perceptual remediation/compensation   PT Next Visit Plan do SOT for baseline measure; review VORx1 in standing for accuracy; progress to narrow BOS as appropriate   PT Home Exercise Plan VORx1 horizontal vs vertical   Consulted and Agree with Plan of Care Patient      Patient will benefit from skilled therapeutic intervention in order to improve the following deficits and impairments:  Decreased activity tolerance, Decreased balance, Dizziness, Impaired sensation, Decreased knowledge of use of DME, Impaired vision/preception  Visit Diagnosis: Dizziness and giddiness - Plan: PT plan of care cert/re-cert     Problem List Patient Active Problem List   Diagnosis Date Noted  . Vertigo 09/15/2017  . B12 deficiency 08/26/2017  . Obesity (BMI 30.0-34.9) 08/10/2017  . Diastolic dysfunction without heart failure 07/28/2017  . Hypogonadism male 07/13/2017  . Erectile dysfunction due to arterial insufficiency 07/13/2017  . Nonspecific abnormal electrocardiogram (ECG) (EKG) 07/13/2017  . History of colonic polyps 09/16/2016  . MGUS (monoclonal gammopathy of unknown significance) 06/11/2016  . 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 Coarse tremors 04/09/2016  . Routine general medical examination at a health care facility 02/03/2016  . Restless legs syndrome with nocturnal myoclonus 10/02/2015  . Chronic pain 05/07/2015  . Acid reflux 05/07/2015  . Kidney cysts 05/07/2015  . Prediabetes 11/14/2014  . OSTEOPENIA 09/24/2009  . DUPUYTREN'S CONTRACTURE, RIGHT 09/06/2008  . DEGENERATION, LUMBAR/LUMBOSACRAL DISC 07/15/2007  . Depression, controlled 06/14/2007  . Attention deficit disorder 06/14/2007  . Hyperlipidemia with target LDL less than 130 05/13/2007  . Essential hypertension 05/13/2007  . BENIGN PROSTATIC  HYPERTROPHY 05/13/2007    Rexanne Mano, PT 09/30/2017, 7:50 PM  [facility] [address] [place], [place], [address] Phone: [phone]   Fax:  [phone]  Name: Callin Ashe  Ribeiro MRN: [id number] Date of Birth: 1951/10/14

## 2017-10-01 ENCOUNTER — Other Ambulatory Visit: Payer: Self-pay | Admitting: Internal Medicine

## 2017-10-01 DIAGNOSIS — R42 Dizziness and giddiness: Secondary | ICD-10-CM

## 2017-10-05 ENCOUNTER — Ambulatory Visit: Payer: No Typology Code available for payment source | Admitting: Physical Therapy

## 2017-10-05 ENCOUNTER — Encounter: Payer: Self-pay | Admitting: Physical Therapy

## 2017-10-05 DIAGNOSIS — R42 Dizziness and giddiness: Secondary | ICD-10-CM | POA: Diagnosis not present

## 2017-10-05 NOTE — Therapy (Signed)
[facility] [address] [place], [place], [address] Phone: [phone]   Fax:  [phone]  Physical Therapy Treatment  Patient Details  Name: Robert Mccall MRN: [id number] Date of Birth: Jan 28, 1951 Referring Provider: Clearance Coots MD  Encounter Date: 10/05/2017      PT End of Session - 10/05/17 2106    Visit Number 1   Number of Visits 3   Date for PT Re-Evaluation 11/05/17   Authorization Type [employer]   PT Start Time 1440   PT Stop Time 1530   PT Time Calculation (min) 50 min   Activity Tolerance Patient tolerated treatment well   Behavior During Therapy [facility] for tasks assessed/performed      Past Medical History:  Diagnosis Date  . ADHD (attention deficit hyperactivity disorder)   . Anxiety   . Benign prostatic hypertrophy   . Chronic lower back pain    surgery schedule for back 10/21/16  . Cyst of right kidney    incidental Bosiak 1.3cm on R (MRI abd 09/2014), follows with uro  . Depression   . GERD (gastroesophageal reflux disease)   . Glaucoma   . History of migraine headaches    no meds  . Hyperlipidemia   . Hypertension     Past Surgical History:  Procedure Laterality Date  . COLONOSCOPY  09/2007   polyps/ Deatra Ina  . HYDROCELE EXCISION Right 1990  . LUMBAR DISC SURGERY  10/21/2016  . ORCHIECTOMY Left 1970  . WISDOM TOOTH EXTRACTION      There were no vitals filed for this visit.      Subjective Assessment - 10/05/17 1438    Subjective No more severe dizzy spells. Did OK with the exercises. Initially vertical head nods caused some movement of the target, however improved to point he can do 30 sec without target moving. However, horizontal head turns the target was immediately in motion (and he continued to do 30 sec). Did at least 2 times per day and each time got a headache that lasted for several hours. Sinus pressure/frontal HA.    Pertinent History h/o visual migraines (no meds  ever taken and had none in recent years)   Patient Stated Goals Understand what is causing the spinning and reduce it   Currently in Pain? Yes   Pain Score 2    Pain Location Head   Pain Orientation --  top of head   Pain Descriptors / Indicators Pressure   Pain Type Acute pain   Pain Onset In the past 7 days                Vestibular Assessment - 10/05/17 0001      Balancemaster   Psychologist, occupational   Composite score= 64% compared to age/height normative value of 66%.     Patient demonstrated scores of:   90 for use of somatosensory feedback for balance (compared to age/height normative value of 90),  75 for use of visual feedback for balance (compared to age/height normative value of 82),   45 for use of vestibular feedback for balance (compared to age/height normative value of 53).   COG readings were shifted slightly anterior of center.               Vestibular Treatment/Exercise - 10/05/17 0001      Vestibular Treatment/Exercise   Vestibular Treatment Provided Gaze   Gaze Exercises X1 Viewing Horizontal;X1 Viewing Vertical  X1 Viewing Horizontal   Foot Position seated   Time --  <10 sec with target beginning to move   Reps 3   Comments if pt slowed down head turn to keep target from moving, he was then moving too slowly to elicit the VOR     X1 Viewing Vertical   Foot Position seated   Time --  30 seconds   Reps 1   Comments                 PT Education - 10/05/17 2105    Education provided Yes   Education Details results of SOT; updated how to do VOR x1 exercises   Person(s) Educated Patient   Methods Explanation;Demonstration;Verbal cues;Handout   Comprehension Verbalized understanding;Returned demonstration          PT Short Term Goals - 10/05/17 2118      PT SHORT TERM GOAL #1   Title Patient will be independent with basic VORx1 exercises and able to verbalize  how to progress complexity of exercise as he improves. (Target by 10/08/17)   Time 1   Period Weeks   Status Achieved     PT SHORT TERM GOAL #2   Title Patient will complete SOT for baseline measure of effective use/strength of vestibular system.    Time 1   Period Weeks   Status Achieved           PT Long Term Goals - 09/30/17 1939      PT LONG TERM GOAL #1   Title Patient will be independent with updated HEP for vestibular rehab. (Target for LTGs 11/05/17)   Time 5   Period Weeks   Status New     PT LONG TERM GOAL #2   Title Patient will repeat SOT with improvement in use of vestibular system demonstrated.    Time 5   Period Weeks   Status New               Plan - 10/05/17 2109    Clinical Impression Statement Session focused on performing SOT, explaining results to pt, and revising patient's HEP. Overall, the SOT showed both his vestibular and vision systems are impaired (with vestibular more impaired). His report of decreased dificulty with vertical VORx1 exercise is indicative that his system can improve with regards to his hypofunction. Of concern was the onset of headache after the VOR exercises and likely represents that he was overstimulating his system when continuing horizontal head turns for a total of 30 seconds, even after the target was no longer stationary or in focus. Patient reports his MD did make a referral to an ENT and he is waiiting for that office to call and schedule his appointment. Patient in agreement to return to PT in 4 weeks to assess for progress and update HEP if appropriate.    Rehab Potential Fair  due to need for medical diagnosis/management   Clinical Impairments Affecting Rehab Potential peripheral neuropathy, diplopia, anxiety, h/o migraines   PT Frequency Other (comment)  1x/wk for one week; then 1x/wk in 4 weeks   PT Duration Other (comment)  5 weeks   PT Treatment/Interventions ADLs/Self Care Home Management;DME  Instruction;Therapeutic activities;Therapeutic exercise;Balance training;Neuromuscular re-education;Patient/family education;Vestibular;Visual/perceptual remediation/compensation   PT Next Visit Plan reassess vestibular status and tolerance for VOR x 1; ? when will see ENT   PT Home Exercise Plan VORx1 horizontal vs vertical   Consulted and Agree with Plan of Care Patient      Patient  will benefit from skilled therapeutic intervention in order to improve the following deficits and impairments:  Decreased activity tolerance, Decreased balance, Dizziness, Impaired sensation, Decreased knowledge of use of DME, Impaired vision/preception  Visit Diagnosis: Dizziness and giddiness     Problem List Patient Active Problem List   Diagnosis Date Noted  . Vertigo 09/15/2017  . B12 deficiency 08/26/2017  . Obesity (BMI 30.0-34.9) 08/10/2017  . Diastolic dysfunction without heart failure 07/28/2017  . Hypogonadism male 07/13/2017  . Erectile dysfunction due to arterial insufficiency 07/13/2017  . Nonspecific abnormal electrocardiogram (ECG) (EKG) 07/13/2017  . History of colonic polyps 09/16/2016  . MGUS (monoclonal gammopathy of unknown significance) 06/11/2016  . Neuropathy associated with monoclonal gammopathy of undetermined significance ([place]) 06/11/2016  . Coarse tremors 04/09/2016  . Routine general medical examination at a health care facility 02/03/2016  . Restless legs syndrome with nocturnal myoclonus 10/02/2015  . Chronic pain 05/07/2015  . Acid reflux 05/07/2015  . Kidney cysts 05/07/2015  . Prediabetes 11/14/2014  . OSTEOPENIA 09/24/2009  . DUPUYTREN'S CONTRACTURE, RIGHT 09/06/2008  . DEGENERATION, LUMBAR/LUMBOSACRAL DISC 07/15/2007  . Depression, controlled 06/14/2007  . Attention deficit disorder 06/14/2007  . Hyperlipidemia with target LDL less than 130 05/13/2007  . Essential hypertension 05/13/2007  . BENIGN PROSTATIC HYPERTROPHY 05/13/2007    Rexanne Mano,  PT 10/05/2017, 9:20 PM  [facility] [address] [place], [place], [address] Phone: [phone]   Fax:  [phone]  Name: DENNEY SHEIN MRN: [id number] Date of Birth: February 11, 1951

## 2017-10-05 NOTE — Patient Instructions (Addendum)
   Stop doing the vertical exercise.   Anytime your headache is lasting more than 1 hour, you need to reduce the complexity of the exercise (decrease #reps or # of times per day)  For now, start turning head slowly and progress to quickly. Stop when the E begins to move. Do 1 rep, 3 times per day.  Build back up to 3 reps, 3 times per day; build up from 30 seconds to 60 seconds.

## 2017-10-07 ENCOUNTER — Encounter: Payer: Self-pay | Admitting: Neurology

## 2017-10-12 ENCOUNTER — Encounter: Payer: Self-pay | Admitting: Physical Therapy

## 2017-10-19 ENCOUNTER — Encounter: Payer: Self-pay | Admitting: Physical Therapy

## 2017-10-26 ENCOUNTER — Encounter: Payer: Self-pay | Admitting: Physical Therapy

## 2017-11-01 ENCOUNTER — Encounter: Payer: Self-pay | Admitting: Internal Medicine

## 2017-11-02 ENCOUNTER — Encounter: Payer: Self-pay | Admitting: Physical Therapy

## 2017-11-02 NOTE — Telephone Encounter (Signed)
I called Dr. Redmond Baseman office and he has no MRI ordered for pt

## 2017-11-04 ENCOUNTER — Other Ambulatory Visit: Payer: Self-pay | Admitting: Internal Medicine

## 2017-11-04 ENCOUNTER — Telehealth: Payer: Self-pay | Admitting: Internal Medicine

## 2017-11-04 NOTE — Telephone Encounter (Signed)
Per Lattie Haw CMA with [facility] ENT calling to see if HCTZ can be increased to 25 mg? Per Lattie Haw pt dx with dizziness, Meniere's  Disease

## 2017-11-04 NOTE — Telephone Encounter (Signed)
Please see original note

## 2017-11-05 ENCOUNTER — Other Ambulatory Visit: Payer: Self-pay | Admitting: Internal Medicine

## 2017-11-05 DIAGNOSIS — I10 Essential (primary) hypertension: Secondary | ICD-10-CM

## 2017-11-05 MED ORDER — HYDROCHLOROTHIAZIDE 25 MG PO TABS: 25.0000 mg | ORAL_TABLET | Freq: Every day | ORAL | 1 refills | Status: DC

## 2017-11-05 NOTE — Telephone Encounter (Signed)
Dose increased as requested New Rx sent to his pharmacy

## 2017-11-05 NOTE — Telephone Encounter (Signed)
Left detailed message informing pt rx HCTZ was increased to 25 mg.

## 2017-11-08 ENCOUNTER — Encounter: Payer: Self-pay | Admitting: Physical Therapy

## 2017-11-08 NOTE — Therapy (Signed)
[facility] [address] [place], [place], [address] Phone: [phone]   Fax:  [phone]  Patient Details  Name: Robert Mccall MRN: [id number] Date of Birth: 05-30-1951 Referring Provider:  Clearance Coots MD  Encounter Date: 11/08/2017  PHYSICAL THERAPY DISCHARGE SUMMARY  Visits from Start of Care: 2  Current functional level related to goals / functional outcomes: Unable to assess as pt did not return to clinic   Remaining deficits: Unable to assess as pt did not return to clinic   Education / Equipment: HEP  Plan: Patient agrees to discharge.  Patient goals were not met. Patient is being discharged due to not returning since the last visit.  ?????      [employer] PT 11/08/2017, 5:28 PM  [facility] [address] [place], [place], [address] Phone: [phone]   Fax:  [phone]

## 2017-11-10 ENCOUNTER — Encounter: Payer: Self-pay | Admitting: Internal Medicine

## 2017-11-10 ENCOUNTER — Ambulatory Visit: Payer: No Typology Code available for payment source | Admitting: Internal Medicine

## 2017-11-10 VITALS — BP 120/72 | HR 104 | Ht 64.0 in | Wt 174.0 lb

## 2017-11-10 DIAGNOSIS — I10 Essential (primary) hypertension: Secondary | ICD-10-CM | POA: Diagnosis not present

## 2017-11-10 DIAGNOSIS — M50122 Cervical disc disorder at C5-C6 level with radiculopathy: Secondary | ICD-10-CM | POA: Insufficient documentation

## 2017-11-10 DIAGNOSIS — R200 Anesthesia of skin: Secondary | ICD-10-CM

## 2017-11-10 DIAGNOSIS — R202 Paresthesia of skin: Secondary | ICD-10-CM

## 2017-11-10 NOTE — Progress Notes (Signed)
Subjective:  Patient ID: Robert Mccall, male    DOB: 26-Mar-1951  Age: 66 y.o. MRN: [id number]  CC: Follow-up   HPI GLENDA KUNST presents for a 3-week history of numbness and tingling in his right arm and right fingers.  He also has intermittent discomfort in his neck.  He takes Advil and acetaminophen for the neck pain and it controls the pain.  His symptoms are worse during the night.  He has chronic paresthesias in his lower extremities due to idiopathic neuropathy but the sensations in the right upper extremity are new.  Outpatient Medications Prior to Visit  Medication Sig Dispense Refill  . Acetaminophen 500 MG coapsule Take 500 mg by mouth Nightly.    Marland Kitchen alfuzosin (UROXATRAL) 10 MG 24 hr tablet TAKE 1 TABLET(10 MG) BY MOUTH DAILY WITH BREAKFAST 90 tablet 1  . ANDROGEL PUMP 20.25 MG/ACT (1.62%) GEL USE 2 ACTUATIONS ON THE SKIN D  5  . aspirin 81 MG tablet Take 81 mg by mouth daily.      Marland Kitchen BETIMOL 0.5 % ophthalmic solution     . bimatoprost (LUMIGAN) 0.03 % ophthalmic solution INT 1 GTT IN OU HS  0  . Cholecalciferol (VITAMIN D) 2000 UNITS CAPS Take 1 capsule (2,000 Units total) by mouth 1 dose over 46 hours. 30 capsule 11  . doxylamine, Sleep, (UNISOM) 25 MG tablet Take 25 mg by mouth Nightly.    . DULoxetine (CYMBALTA) 30 MG capsule TAKE 1 CAPSULE BY MOUTH EVERY DAY 90 capsule 0  . esomeprazole (NEXIUM) 40 MG capsule TAKE 1 CAPSULE(40MG ) BY MOUTH DAILY 90 capsule 0  . finasteride (PROSCAR) 5 MG tablet TAKE 1 TABLET BY MOUTH DAILY 90 tablet 0  . gabapentin (NEURONTIN) 300 MG capsule TAKE 1 CAPSULE(300 MG) BY MOUTH THREE TIMES DAILY 270 capsule 0  . hydrochlorothiazide (HYDRODIURIL) 25 MG tablet Take 1 tablet (25 mg total) by mouth daily. 90 tablet 1  . KRILL OIL 1000 MG CAPS Take 1,000 mg by mouth daily.     Marland Kitchen lamoTRIgine (LAMICTAL) 25 MG tablet TAKE 3 TABLETS BY MOUTH TWICE DAILY 540 tablet 0  . Lorcaserin HCl ER (BELVIQ XR) 20 MG TB24 Take 1 tablet by mouth daily. 30 tablet 5  .  meclizine (ANTIVERT) 12.5 MG tablet Take 1 tablet (12.5 mg total) by mouth 3 (three) times daily as needed for dizziness. 30 tablet 0  . methylphenidate (RITALIN LA) 30 MG 24 hr capsule Take 1 capsule (30 mg total) by mouth every morning. 30 capsule 0  . simvastatin (ZOCOR) 40 MG tablet Take 1 tablet (40 mg total) by mouth every evening. 90 tablet 1  . verapamil (CALAN-SR) 240 MG CR tablet TAKE 1 TABLET(240 MG) BY MOUTH DAILY 90 tablet 1  . vitamin B-12 (CYANOCOBALAMIN) 1000 MCG tablet Take 1,000 mcg by mouth daily.     No facility-administered medications prior to visit.     ROS Review of Systems  Constitutional: Negative.  Negative for appetite change, diaphoresis, fatigue and unexpected weight change.  HENT: Negative for trouble swallowing and voice change.   Eyes: Negative.  Negative for visual disturbance.  Respiratory: Negative for cough, chest tightness, shortness of breath and wheezing.   Cardiovascular: Negative for chest pain, palpitations and leg swelling.  Gastrointestinal: Negative for abdominal pain, constipation, diarrhea, nausea and vomiting.  Endocrine: Negative.   Genitourinary: Negative.  Negative for difficulty urinating.  Musculoskeletal: Positive for neck pain. Negative for back pain.  Skin: Negative.  Negative for color change  and rash.  Allergic/Immunologic: Negative.   Neurological: Positive for numbness. Negative for dizziness, tremors, syncope, facial asymmetry, weakness, light-headedness and headaches.  Hematological: Negative for adenopathy. Does not bruise/bleed easily.  Psychiatric/Behavioral: Negative.     Objective:  BP 120/72   Pulse (!) 104   Ht 5\' 4"  (1.626 m)   Wt 174 lb (78.9 kg)   SpO2 98%   BMI 29.87 kg/m   BP Readings from Last 3 Encounters:  11/10/17 120/72  09/15/17 134/64  08/31/17 (!) 139/93    Wt Readings from Last 3 Encounters:  11/10/17 174 lb (78.9 kg)  09/15/17 173 lb (78.5 kg)  08/31/17 174 lb (78.9 kg)    Physical  Exam  Constitutional: He is oriented to person, place, and time. No distress.  HENT:  Mouth/Throat: Oropharynx is clear and moist. No oropharyngeal exudate.  Eyes: Conjunctivae are normal. Right eye exhibits no discharge. Left eye exhibits no discharge. No scleral icterus.  Neck: Normal range of motion. Neck supple. No JVD present. No thyromegaly present.  Cardiovascular: Normal rate, regular rhythm and intact distal pulses. Exam reveals no gallop.  No murmur heard. Pulmonary/Chest: Effort normal and breath sounds normal. No respiratory distress. He has no wheezes. He has no rales. He exhibits no tenderness.  Abdominal: Soft. Bowel sounds are normal. He exhibits no mass. There is no tenderness. There is no guarding.  Musculoskeletal: Normal range of motion. He exhibits no edema, tenderness or deformity.  Lymphadenopathy:    He has no cervical adenopathy.  Neurological: He is alert and oriented to person, place, and time. He has normal strength. He displays abnormal reflex. He displays no atrophy and no tremor. No cranial nerve deficit or sensory deficit. He exhibits normal muscle tone. He displays a negative Romberg sign. Coordination and gait normal.  Reflex Scores:      Tricep reflexes are 1+ on the right side and 0 on the left side.      Bicep reflexes are 1+ on the right side.      Brachioradialis reflexes are 1+ on the right side and 0 on the left side.      Patellar reflexes are 0 on the right side and 0 on the left side.      Achilles reflexes are 0 on the right side and 0 on the left side. Skin: Skin is warm and dry. No rash noted. He is not diaphoretic. No erythema. No pallor.  Psychiatric: He has a normal mood and affect. His behavior is normal. Judgment and thought content normal.  Vitals reviewed.   Lab Results  Component Value Date   WBC 6.0 08/31/2017   HGB 14.8 08/31/2017   HCT 43.8 08/31/2017   PLT 229 08/31/2017   GLUCOSE 104 (H) 08/31/2017   CHOL 125 08/31/2017    TRIG 120 08/31/2017   HDL 39 (L) 08/31/2017   LDLCALC 62 08/31/2017   ALT 35 08/31/2017   AST 28 08/31/2017   NA 138 08/31/2017   K 4.1 08/31/2017   CL 99 08/31/2017   CREATININE 1.08 08/31/2017   BUN 16 08/31/2017   CO2 28 07/13/2017   TSH 3.210 08/31/2017   PSA 0.23 07/13/2017   INR 1.04 01/23/2017   HGBA1C 5.5 08/31/2017    No results found.  Assessment & Plan:   Aniketh was seen today for follow-up.  Diagnoses and all orders for this visit:  Numbness and tingling of right arm- He has paresthesias in his right upper extremity and is hyperreflexic in the  right upper extremity.  He has a remote history of neck discomfort.  I have ordered an NCS/EMG to identify where his symptoms are coming from.  If he has a cervical radiculopathy then we will consider ordering an MRI of the cervical spine. -     Ambulatory referral to Neurology  Essential hypertension- His blood pressure is well controlled.   I am having Hardin Negus. Mollenhauer maintain his aspirin, Krill Oil, Vitamin D, meclizine, simvastatin, vitamin B-12, alfuzosin, verapamil, Lorcaserin HCl ER, methylphenidate, bimatoprost, BETIMOL, ANDROGEL PUMP, DULoxetine, esomeprazole, finasteride, gabapentin, lamoTRIgine, hydrochlorothiazide, Acetaminophen, and doxylamine (Sleep).  No orders of the defined types were placed in this encounter.    Follow-up: Return in about 3 months (around 02/08/2018).  Scarlette Calico, MD

## 2017-11-10 NOTE — Patient Instructions (Signed)

## 2017-11-18 NOTE — Progress Notes (Deleted)
Subjective:   Robert Mccall was seen in consultation in the movement disorder clinic at the request of Robert Lima, MD.  The evaluation is for tremor.  The patient is a 66 y.o. right handed male with a history of tremor.   The patient notes tremor for the last few years; he notes it primarily when he uses it and notes it when he puts toothpaste on the toothbrush.  No new meds at the time.  Reports that he has been on methylphenidate for years and cymbalta started about 1 year ago.   There is no family hx of tremor.    Affected by caffeine:  No. (couple soda/pepsi free per day) Affected by alcohol:  Yes.   (drinks 1-2 glasses wine/day - helps tremor) Affected by stress:  No. Affected by fatigue:  No. Spills soup if on spoon:  No. Spills glass of liquid if full:  No. Affects ADL's (tying shoes, brushing teeth, etc):  Yes.   (notes it buttoning shirt/brushing teeth)  Current/Previously tried tremor medications: n/a  Current medications that may exacerbate tremor:  Methylphenidate, cymbalta  Pt also c/o memory change since last fall.  States that his coworkers have noted little mistakes on important reports at work.  He was having trouble typing that may have contributed but that got better.  He does do the finances in his house without difficulty.  He states that all bills are drafted but they always have been.  He is able to drive and get from place to place without getting lost.  No MVA's in the recent years.  Distributes medications to himself and remembers to take properly.  11/17/16 update:  Pt f/u today.  He had an MRI lumbar spine since our last visit that I had the opportunity to review.  This demonstrated: 1. a new caudal discextrusion at L4-5 with sequestered disc fragment which is superimposed on fairlycadvanced chronic disc, endplate, and facet degeneration. Severe spinal and moderate to severe biforaminal stenosis at that level has not significantly changed but there is  increased impingement of the descending left L5 nerve roots.  2. Chronic moderate to severe spinal and foraminal stenosis at L3-L4 is stable. Chronic L2-L3 moderate to severe right lateral recess and mild spinal stenosis is stable. Chronic up to moderate left lateral recess and severe left foraminal stenosis at L5-S1 is stable. 3. Increased borderline to mild lower thoracic spinal stenosis at T10-T11 and T12-L1.  Neurosx opinion was advised and the pt saw Dr. Arnoldo Mccall on 09/29/16.  I reviewed those records.  The patient ultimately had microdiscectomy on 11/15.  Last visit, labs were done, which identified a slightly low B12 and he is now on a supplement.  IgG MGUS also identified and he saw Dr. Alvy Mccall in July and September.  She will be following him one time a year.  In regards to tremor, he states that it is a bit less, unless he is at a social dinner.  Memory has been stable.  Noting some pinky and ring finger paresthesias on the L upon awakening but goes away when wakes up; injured elbow about a year ago.    02/23/17 update:  Pt seen today in follow up.  Last seen 3 months ago, at which point I released him from I care.  States that he returns as he had an episode on 01/23/17 that he describes as confusion where he couldn't remember things.  Had trouble remembering what he ate for breakfast or if he took  his medication.  Pt states that his roommate told him he kept repeating the same things such as "I can't find this."    No speech change according to the patient, but his roommate thought perhaps his speech was off.   Pt unsure of this today as his roommate wasn't there.   No lateralizing weakness or paresthesias.  Was evaluated in the emergency room and I reviewed those records.  Speech was normal in the emergency room, although he stated that it was January, 2020.  CT of the brain was done without contrast and MRI of the brain was done without contrast in the emergency room on 01/23/2017.  Both of these were  unremarkable.  I reviewed the films.  Diffusion weighted imaging was negative.  EEG was done in our office on 01/27/2017 and was normal.  Pt stated that entire event lasted 6 hours or so.  Pt doesn't remember much of day.  Pt has long hx of "ocular migraine."  No hx of similar and has not re-occurred since.  Patient does complain of memory loss.  States that it has been going on since long before the episode in February that was tender to the emergency room.  His primarily short-term memory loss.  11/19/17 update: Patient seen today in follow-up.  I have reviewed records from his primary care physician.  The patient reports approximately a 1 month history of paresthesias in the right arm.  Paresthesias involve the thumb, pointer, middle and ring finger.  He had similar sx's on the L but they didn't persist.  He is ***R hand dominant.  Outside reports reviewed: historical medical records, lab reports and referral letter/letters.  No Known Allergies  Outpatient Encounter Medications as of 11/19/2017  Medication Sig  . Acetaminophen 500 MG coapsule Take 500 mg by mouth Nightly.  Marland Kitchen alfuzosin (UROXATRAL) 10 MG 24 hr tablet TAKE 1 TABLET(10 MG) BY MOUTH DAILY WITH BREAKFAST  . ANDROGEL PUMP 20.25 MG/ACT (1.62%) GEL USE 2 ACTUATIONS ON THE SKIN D  . aspirin 81 MG tablet Take 81 mg by mouth daily.    Marland Kitchen BETIMOL 0.5 % ophthalmic solution   . bimatoprost (LUMIGAN) 0.03 % ophthalmic solution INT 1 GTT IN OU HS  . Cholecalciferol (VITAMIN D) 2000 UNITS CAPS Take 1 capsule (2,000 Units total) by mouth 1 dose over 46 hours.  Marland Kitchen doxylamine, Sleep, (UNISOM) 25 MG tablet Take 25 mg by mouth Nightly.  . DULoxetine (CYMBALTA) 30 MG capsule TAKE 1 CAPSULE BY MOUTH EVERY DAY  . esomeprazole (NEXIUM) 40 MG capsule TAKE 1 CAPSULE(40MG ) BY MOUTH DAILY  . finasteride (PROSCAR) 5 MG tablet TAKE 1 TABLET BY MOUTH DAILY  . gabapentin (NEURONTIN) 300 MG capsule TAKE 1 CAPSULE(300 MG) BY MOUTH THREE TIMES DAILY  .  hydrochlorothiazide (HYDRODIURIL) 25 MG tablet Take 1 tablet (25 mg total) by mouth daily.  Marland Kitchen KRILL OIL 1000 MG CAPS Take 1,000 mg by mouth daily.   Marland Kitchen lamoTRIgine (LAMICTAL) 25 MG tablet TAKE 3 TABLETS BY MOUTH TWICE DAILY  . Lorcaserin HCl ER (BELVIQ XR) 20 MG TB24 Take 1 tablet by mouth daily.  . meclizine (ANTIVERT) 12.5 MG tablet Take 1 tablet (12.5 mg total) by mouth 3 (three) times daily as needed for dizziness.  . methylphenidate (RITALIN LA) 30 MG 24 hr capsule Take 1 capsule (30 mg total) by mouth every morning.  . simvastatin (ZOCOR) 40 MG tablet Take 1 tablet (40 mg total) by mouth every evening.  . verapamil (CALAN-SR) 240 MG CR  tablet TAKE 1 TABLET(240 MG) BY MOUTH DAILY  . vitamin B-12 (CYANOCOBALAMIN) 1000 MCG tablet Take 1,000 mcg by mouth daily.   No facility-administered encounter medications on file as of 11/19/2017.     Past Medical History:  Diagnosis Date  . ADHD (attention deficit hyperactivity disorder)   . Anxiety   . Benign prostatic hypertrophy   . Chronic lower back pain    surgery schedule for back 10/21/16  . Cyst of right kidney    incidental [place] 1.3cm on R (MRI abd 09/2014), follows with uro  . Depression   . GERD (gastroesophageal reflux disease)   . Glaucoma   . History of migraine headaches    no meds  . Hyperlipidemia   . Hypertension     Past Surgical History:  Procedure Laterality Date  . COLONOSCOPY  09/2007   polyps/ Deatra Ina  . HYDROCELE EXCISION Right 1990  . LUMBAR DISC SURGERY  10/21/2016  . ORCHIECTOMY Left 1970  . WISDOM TOOTH EXTRACTION      Social History   Socioeconomic History  . Marital status: Single    Spouse name: Not on file  . Number of children: Not on file  . Years of education: Not on file  . Highest education level: Not on file  Social Needs  . Financial resource strain: Not on file  . Food insecurity - worry: Not on file  . Food insecurity - inability: Not on file  . Transportation needs - medical: Not  on file  . Transportation needs - non-medical: Not on file  Occupational History  . Occupation: Freight forwarder    Comment: [employer]  Tobacco Use  . Smoking status: Never Smoker  . Smokeless tobacco: Never Used  Substance and Sexual Activity  . Alcohol use: Yes    Comment: 1-2 glasses wine per day  . Drug use: No  . Sexual activity: Yes    Comment: working with replacement  Other Topics Concern  . Not on file  Social History Narrative  . Not on file    Family Status  Relation Name Status  . Mother  Deceased at age 73       COPD, heart disease, breast ca  . Father  Alive       DM, blind  . Brother  Alive       stroke  . Sister  Alive       healthy  . Sister  Alive       bipolar, lupus  . Son  Alive       healthy  . Daughter  Alive       healthy  . Neg Hx  (Not Specified)    Review of Systems  A complete 10 system ROS was obtained and was negative apart from what is mentioned.   Objective:   VITALS:   There were no vitals filed for this visit. Gen:  Appears stated age and in NAD.  He is anxious HEENT:  Normocephalic, atraumatic. The mucous membranes are moist. The superficial temporal arteries are without ropiness or tenderness. Cardiovascular: tachycardic.  Regular rhythm. Lungs: Clear to auscultation bilaterally. Neck: There are no carotid bruits noted bilaterally.  NEUROLOGICAL:  Orientation:   Montreal Cognitive Assessment  05/18/2016  Visuospatial/ Executive (0/5) 5  Naming (0/3) 2  Attention: Read list of digits (0/2) 2  Attention: Read list of letters (0/1) 1  Attention: Serial 7 subtraction starting at 100 (0/3) 3  Language: Repeat phrase (0/2) 2  Language :  Fluency (0/1) 1  Abstraction (0/2) 2  Delayed Recall (0/5) 4  Orientation (0/6) 6  Total 28  Adjusted Score (based on education) 28   Cranial nerves: There is good facial symmetry. Speech is fluent and clear. Soft palate rises symmetrically and there is no tongue deviation.  Hearing is intact to conversational tone. Tone: Tone is good throughout. Sensation: Sensation is intact to light touch throughout Coordination:  The patient has no dysdiadichokinesia or dysmetria. Motor: Strength is 5/5 in the bilateral upper and lower extremities.  Shoulder shrug is equal bilaterally.  There is no pronator drift.  There are no fasciculations noted (pt in exam gown/shorts. DTR's: Deep tendon reflexes are 2/4 at the bilateral biceps, triceps, brachioradialis, 3/4 at the bilateral patella. Gait and Station: The patient is able to ambulate without difficulty.   MOVEMENT EXAM: Tremor:  There is no rest tremor today.  He has mild tremor of the outstretched hand that increases with intention   LABS  Lab Results  Component Value Date   TSH 3.210 08/31/2017   Lab Results  Component Value Date   VITAMINB12 281 05/18/2016    Lab Results  Component Value Date   FOLATE 20.3 05/18/2016      Chemistry      Component Value Date/Time   NA 138 08/31/2017 0847   NA 140 08/18/2016 0953   K 4.1 08/31/2017 0847   K 3.5 08/18/2016 0953   CL 99 08/31/2017 0847   CO2 28 07/13/2017 0933   CO2 26 08/18/2016 0953   BUN 16 08/31/2017 0847   BUN 17.2 08/18/2016 0953   CREATININE 1.08 08/31/2017 0847   CREATININE 1.1 08/18/2016 0953   GLU 102 08/02/2015      Component Value Date/Time   CALCIUM 9.1 08/31/2017 0847   CALCIUM 9.2 08/18/2016 0953   ALKPHOS 64 08/31/2017 0847   ALKPHOS 59 08/18/2016 0953   AST 28 08/31/2017 0847   AST 21 08/18/2016 0953   ALT 35 08/31/2017 0847   ALT 24 08/18/2016 0953   BILITOT 0.4 08/31/2017 0847   BILITOT 0.95 08/18/2016 0953        Lab Results  Component Value Date   HGBA1C 5.5 08/31/2017      Assessment/Plan:   1.  Hand paresthesias  -Symptoms sound consistent with median neuropathy at the wrist.  2.  Tremor.  - Tremor may be exacerbated by medication, especially methylphenidate.  Cymbalta may also contribute to tremor.   3.   Lumbar degenerative changes  -s/p microdiscectomy on 10/21/16  4.  Peripheral neuropathy with B12 deficiency and IgG MGUS  -limit EtOH  -B12 supplement 1030mcg daily  -continue yearly f/u with Dr. Alvy Mccall

## 2017-11-19 ENCOUNTER — Ambulatory Visit: Payer: Self-pay | Admitting: Neurology

## 2017-11-19 ENCOUNTER — Other Ambulatory Visit: Payer: Self-pay | Admitting: *Deleted

## 2017-11-19 DIAGNOSIS — R200 Anesthesia of skin: Secondary | ICD-10-CM

## 2017-11-21 ENCOUNTER — Other Ambulatory Visit: Payer: Self-pay | Admitting: Internal Medicine

## 2017-11-24 ENCOUNTER — Other Ambulatory Visit: Payer: Self-pay | Admitting: Internal Medicine

## 2017-11-24 ENCOUNTER — Encounter: Payer: Self-pay | Admitting: Internal Medicine

## 2017-11-24 DIAGNOSIS — F900 Attention-deficit hyperactivity disorder, predominantly inattentive type: Secondary | ICD-10-CM

## 2017-11-24 MED ORDER — METHYLPHENIDATE HCL ER (LA) 30 MG PO CP24: 30.0000 mg | ORAL_CAPSULE | ORAL | 0 refills | Status: DC

## 2017-11-25 ENCOUNTER — Encounter: Payer: Self-pay | Admitting: Internal Medicine

## 2017-11-29 ENCOUNTER — Other Ambulatory Visit: Payer: Self-pay | Admitting: Internal Medicine

## 2017-12-01 ENCOUNTER — Other Ambulatory Visit: Payer: Self-pay | Admitting: Internal Medicine

## 2017-12-01 DIAGNOSIS — I10 Essential (primary) hypertension: Secondary | ICD-10-CM

## 2017-12-01 MED ORDER — DILTIAZEM HCL ER COATED BEADS 180 MG PO TB24: 180.0000 mg | ORAL_TABLET | Freq: Every day | ORAL | 1 refills | Status: DC

## 2017-12-02 ENCOUNTER — Encounter: Payer: Self-pay | Admitting: Internal Medicine

## 2017-12-02 ENCOUNTER — Other Ambulatory Visit: Payer: Self-pay | Admitting: Internal Medicine

## 2017-12-02 ENCOUNTER — Ambulatory Visit (INDEPENDENT_AMBULATORY_CARE_PROVIDER_SITE_OTHER): Payer: No Typology Code available for payment source | Admitting: Neurology

## 2017-12-02 DIAGNOSIS — R200 Anesthesia of skin: Secondary | ICD-10-CM

## 2017-12-02 DIAGNOSIS — M50122 Cervical disc disorder at C5-C6 level with radiculopathy: Secondary | ICD-10-CM

## 2017-12-02 DIAGNOSIS — M5412 Radiculopathy, cervical region: Secondary | ICD-10-CM

## 2017-12-02 NOTE — Procedures (Signed)
[facility]  [place], [address]  [place], [place] [address] Tel: [phone] Fax:  [phone] Test Date:  12/02/2017  Patient: Robert Mccall DOB: 02/09/1951 Physician: Narda Amber, DO  Sex: Male Height: 5\' 4"  Ref Phys: Scarlette Calico, M.D.  ID#: [id number] Temp: 36.4C Technician:    Patient Complaints: This is a 66 year-old man referred for evaluation of right arm pain and paresthesias.  NCV & EMG Findings: Extensive electrodiagnostic testing of the right upper extremity shows:  1. Right median, ulnar, and mixed Smethers sensory responses are within normal limits. 2. Right median and ulnar motor responses are within normal limits. 3. Sparse chronic motor axon loss changes are seen affecting the biceps and pronator teres muscles, without accompanied active denervation.   Impression: Chronic C6 radiculopathy affecting the right upper extremity, very mild in degree electrically.    ___________________________ Narda Amber, DO    Nerve Conduction Studies Anti Sensory Summary Table   Stim Site NR Peak (ms) Norm Peak (ms) P-T Amp (V) Norm P-T Amp  Right Median Anti Sensory (2nd Digit)  Wrist    2.9 <3.8 17.7 >10  Right Ulnar Anti Sensory (5th Digit)  Wrist    2.8 <3.2 15.1 >5   Motor Summary Table   Stim Site NR Onset (ms) Norm Onset (ms) O-P Amp (mV) Norm O-P Amp Site1 Site2 Delta-0 (ms) Dist (cm) Vel (m/s) Norm Vel (m/s)  Right Median Motor (Abd Poll Brev)  Wrist    2.8 <4.0 12.2 >5 Elbow Wrist 5.2 28.0 54 >50  Elbow    8.0  12.1         Right Ulnar Motor (Abd Dig Minimi)  Wrist    2.2 <3.1 9.7 >7 B Elbow Wrist 3.9 23.5 60 >50  B Elbow    6.1  9.2  A Elbow B Elbow 1.9 10.0 53 >50  A Elbow    8.0  9.1          Comparison Summary Table   Stim Site NR Peak (ms) Norm Peak (ms) P-T Amp (V) Site1 Site2 Delta-P (ms) Norm Delta (ms)  Right Median/Ulnar Palm Comparison (Wrist - 8cm)  Median Palm    1.7 <2.2 28.5 Median Palm Ulnar Palm 0.0   Ulnar Palm     1.7 <2.2 11.5       EMG   Side Muscle Ins Act Fibs Psw Fasc Number Recrt Dur Dur. Amp Amp. Poly Poly. Comment  Right 1stDorInt Nml Nml Nml Nml Nml Nml Nml Nml Nml Nml Nml Nml N/A  Right PronatorTeres Nml Nml Nml Nml 1- Rapid Few 1+ Nml Nml Nml Nml N/A  Right Biceps Nml Nml Nml Nml 1- Rapid Few 1+ Nml Nml Nml Nml N/A  Right Triceps Nml Nml Nml Nml Nml Nml Nml Nml Nml Nml Nml Nml N/A  Right Deltoid Nml Nml Nml Nml Nml Nml Nml Nml Nml Nml Nml Nml N/A      Waveforms:

## 2017-12-22 ENCOUNTER — Encounter: Payer: Self-pay | Admitting: Internal Medicine

## 2017-12-27 ENCOUNTER — Encounter: Payer: Self-pay | Admitting: Internal Medicine

## 2017-12-27 ENCOUNTER — Other Ambulatory Visit: Payer: Self-pay | Admitting: Internal Medicine

## 2017-12-27 DIAGNOSIS — F900 Attention-deficit hyperactivity disorder, predominantly inattentive type: Secondary | ICD-10-CM

## 2017-12-27 MED ORDER — METHYLPHENIDATE HCL ER (LA) 20 MG PO CP24: 20.0000 mg | ORAL_CAPSULE | ORAL | 0 refills | Status: DC

## 2018-01-06 ENCOUNTER — Encounter: Payer: Self-pay | Admitting: Internal Medicine

## 2018-01-14 ENCOUNTER — Other Ambulatory Visit: Payer: Self-pay | Admitting: Internal Medicine

## 2018-01-14 DIAGNOSIS — I10 Essential (primary) hypertension: Secondary | ICD-10-CM

## 2018-01-14 MED ORDER — AZILSARTAN MEDOXOMIL 40 MG PO TABS: 1.0000 | ORAL_TABLET | Freq: Every day | ORAL | 0 refills | Status: DC

## 2018-01-15 ENCOUNTER — Other Ambulatory Visit: Payer: Self-pay | Admitting: Internal Medicine

## 2018-01-15 DIAGNOSIS — I10 Essential (primary) hypertension: Secondary | ICD-10-CM

## 2018-01-15 MED ORDER — OLMESARTAN MEDOXOMIL 40 MG PO TABS: 40.0000 mg | ORAL_TABLET | Freq: Every day | ORAL | 1 refills | Status: DC

## 2018-01-27 ENCOUNTER — Other Ambulatory Visit: Payer: Self-pay | Admitting: Internal Medicine

## 2018-01-30 ENCOUNTER — Other Ambulatory Visit: Payer: Self-pay | Admitting: Internal Medicine

## 2018-02-01 ENCOUNTER — Encounter: Payer: Self-pay | Admitting: Internal Medicine

## 2018-02-08 ENCOUNTER — Encounter: Payer: Self-pay | Admitting: Internal Medicine

## 2018-02-08 ENCOUNTER — Ambulatory Visit: Payer: No Typology Code available for payment source | Admitting: Internal Medicine

## 2018-02-08 VITALS — BP 140/80 | HR 81 | Temp 97.4°F | Resp 16 | Ht 64.0 in | Wt 189.1 lb

## 2018-02-08 DIAGNOSIS — I5189 Other ill-defined heart diseases: Secondary | ICD-10-CM

## 2018-02-08 DIAGNOSIS — R0609 Other forms of dyspnea: Secondary | ICD-10-CM | POA: Diagnosis not present

## 2018-02-08 DIAGNOSIS — I10 Essential (primary) hypertension: Secondary | ICD-10-CM | POA: Diagnosis not present

## 2018-02-08 NOTE — Progress Notes (Signed)
Subjective:  Patient ID: Robert Mccall, male    DOB: 09-27-1951  Age: 67 y.o. MRN: [id number]  CC: Hypertension   HPI LEGRANDE HAO presents for f/up -he checks his blood pressure and pulse and a rather obsessive manner.  He brings in a log and he is checking his blood pressure and pulse up to 4 times a day.  For the most part his blood pressure has been adequately well controlled that there are a few spikes that are high.  He also tells me that the monitor is occasionally telling him that his pulse is irregular and he complains of episodes of feeling like his heart rate is elevated.  He does not experience dizziness or lightheadedness and is not aware of extra or skipped beats.  He does complain of chronic DOE and chest pressure.  6 months ago he had an echocardiogram that showed grade 1 diastolic dysfunction and a myocardial perfusion imaging was within normal limits.  Outpatient Medications Prior to Visit  Medication Sig Dispense Refill  . Acetaminophen 500 MG coapsule Take 500 mg by mouth Nightly.    Marland Kitchen alfuzosin (UROXATRAL) 10 MG 24 hr tablet TAKE 1 TABLET(10 MG) BY MOUTH DAILY WITH BREAKFAST 90 tablet 1  . ANDROGEL PUMP 20.25 MG/ACT (1.62%) GEL USE 2 ACTUATIONS ON THE SKIN D  5  . aspirin 81 MG tablet Take 81 mg by mouth daily.      Marland Kitchen BETIMOL 0.5 % ophthalmic solution     . Cholecalciferol (VITAMIN D) 2000 UNITS CAPS Take 1 capsule (2,000 Units total) by mouth 1 dose over 46 hours. (Patient taking differently: Take 1 capsule by mouth daily. ) 30 capsule 11  . diltiazem (CARDIZEM LA) 180 MG 24 hr tablet Take 1 tablet (180 mg total) by mouth daily. 90 tablet 1  . DULoxetine (CYMBALTA) 30 MG capsule TAKE ONE CAPSULE BY MOUTH EVERY DAY 90 capsule 1  . esomeprazole (NEXIUM) 40 MG capsule TAKE 1 CAPSULE(40MG ) BY MOUTH DAILY 90 capsule 0  . finasteride (PROSCAR) 5 MG tablet TAKE 1 TABLET BY MOUTH DAILY 90 tablet 0  . hydrochlorothiazide (HYDRODIURIL) 25 MG tablet Take 1 tablet (25 mg total) by  mouth daily. 90 tablet 1  . KRILL OIL 1000 MG CAPS Take 1,000 mg by mouth daily.     Marland Kitchen lamoTRIgine (LAMICTAL) 25 MG tablet TAKE 3 TABLETS BY MOUTH TWICE DAILY 540 tablet 0  . Lorcaserin HCl ER (BELVIQ XR) 20 MG TB24 Take 1 tablet by mouth daily. 30 tablet 5  . meclizine (ANTIVERT) 12.5 MG tablet Take 1 tablet (12.5 mg total) by mouth 3 (three) times daily as needed for dizziness. 30 tablet 0  . methylphenidate (RITALIN LA) 20 MG 24 hr capsule Take 1 capsule (20 mg total) by mouth every morning. 90 capsule 0  . olmesartan (BENICAR) 40 MG tablet Take 1 tablet (40 mg total) by mouth daily. 90 tablet 1  . travoprost, benzalkonium, (TRAVATAN) 0.004 % ophthalmic solution 1 drop nightly.    . vitamin B-12 (CYANOCOBALAMIN) 1000 MCG tablet Take 1,000 mcg by mouth daily.    . bimatoprost (LUMIGAN) 0.03 % ophthalmic solution INT 1 GTT IN OU HS  0  . gabapentin (NEURONTIN) 300 MG capsule TAKE 1 CAPSULE(300 MG) BY MOUTH THREE TIMES DAILY 270 capsule 0   No facility-administered medications prior to visit.     ROS Review of Systems  Constitutional: Negative for diaphoresis, fatigue and unexpected weight change.  HENT: Negative.   Eyes: Negative for  visual disturbance.  Respiratory: Positive for shortness of breath. Negative for cough, chest tightness, wheezing and stridor.   Cardiovascular: Positive for chest pain and palpitations. Negative for leg swelling.  Gastrointestinal: Negative for abdominal pain, constipation, diarrhea, nausea and vomiting.  Endocrine: Negative.   Genitourinary: Negative.   Musculoskeletal: Negative.  Negative for arthralgias and myalgias.  Skin: Negative.  Negative for color change and pallor.  Allergic/Immunologic: Negative.   Neurological: Negative.  Negative for dizziness, syncope, weakness, light-headedness and numbness.  Hematological: Negative for adenopathy. Does not bruise/bleed easily.  Psychiatric/Behavioral: Positive for decreased concentration. Negative for  dysphoric mood, sleep disturbance and suicidal ideas. The patient is nervous/anxious.     Objective:  BP 140/80 (BP Location: Left Arm, Patient Position: Sitting, Cuff Size: Normal)   Pulse 81   Temp (!) 97.4 F (36.3 C) (Oral)   Resp 16   Ht 5\' 4"  (1.626 m)   Wt 189 lb 2 oz (85.8 kg)   SpO2 96%   BMI 32.46 kg/m   BP Readings from Last 3 Encounters:  02/08/18 140/80  11/10/17 120/72  09/15/17 134/64    Wt Readings from Last 3 Encounters:  02/08/18 189 lb 2 oz (85.8 kg)  11/10/17 174 lb (78.9 kg)  09/15/17 173 lb (78.5 kg)    Physical Exam  Constitutional: He is oriented to person, place, and time. No distress.  HENT:  Mouth/Throat: Oropharynx is clear and moist. No oropharyngeal exudate.  Eyes: Conjunctivae are normal. Left eye exhibits no discharge. No scleral icterus.  Neck: Normal range of motion. Neck supple. No JVD present. No thyromegaly present.  Cardiovascular: Normal rate, regular rhythm and normal heart sounds. Exam reveals no gallop and no friction rub.  No murmur heard. EKG--  Sinus  Rhythm  -Old inferior infarct.   ABNORMAL - no change from the prior EKG  Pulmonary/Chest: Effort normal and breath sounds normal. No respiratory distress. He has no wheezes. He has no rales.  Abdominal: Soft. Bowel sounds are normal. He exhibits no distension and no mass. There is no tenderness. There is no guarding.  Musculoskeletal: Normal range of motion. He exhibits no edema, tenderness or deformity.  Lymphadenopathy:    He has no cervical adenopathy.  Neurological: He is alert and oriented to person, place, and time.  Skin: Skin is warm and dry. No rash noted. He is not diaphoretic. No erythema. No pallor.  Psychiatric: He has a normal mood and affect. His behavior is normal. Judgment and thought content normal.  Vitals reviewed.   Lab Results  Component Value Date   WBC 6.0 08/31/2017   HGB 14.8 08/31/2017   HCT 43.8 08/31/2017   PLT 229 08/31/2017   GLUCOSE  104 (H) 08/31/2017   CHOL 125 08/31/2017   TRIG 120 08/31/2017   HDL 39 (L) 08/31/2017   LDLCALC 62 08/31/2017   ALT 35 08/31/2017   AST 28 08/31/2017   NA 138 08/31/2017   K 4.1 08/31/2017   CL 99 08/31/2017   CREATININE 1.08 08/31/2017   BUN 16 08/31/2017   CO2 28 07/13/2017   TSH 3.210 08/31/2017   PSA 0.23 07/13/2017   INR 1.04 01/23/2017   HGBA1C 5.5 08/31/2017    No results found.  Assessment & Plan:   Dangelo was seen today for hypertension.  Diagnoses and all orders for this visit:  Diastolic dysfunction without heart failure- He remains concerned about his heart, blood pressure, and pulse.  Despite my reassurance he tells me he wants to see  a cardiologist.  His volume status is normal today. -     Ambulatory referral to Cardiology  DOE (dyspnea on exertion)- His workup thus far has only been remarkable for diastolic dysfunction.  His exam today does not show any evidence of fluid overload.  I am concerned that his emotional status with anxiety and ADHD drives a lot of his symptoms.  He has no insight into this so I am not hopeful that it will improve. -     EKG 12-Lead  Essential hypertension- His blood pressure is adequately well controlled.   I have discontinued Hardin Negus. Bowery's bimatoprost and gabapentin. I am also having him maintain his aspirin, Krill Oil, Vitamin D, meclizine, vitamin B-12, Lorcaserin HCl ER, BETIMOL, ANDROGEL PUMP, hydrochlorothiazide, Acetaminophen, alfuzosin, diltiazem, methylphenidate, olmesartan, DULoxetine, lamoTRIgine, esomeprazole, finasteride, and travoprost (benzalkonium).  No orders of the defined types were placed in this encounter.    Follow-up: No Follow-up on file.  Scarlette Calico, MD

## 2018-02-25 ENCOUNTER — Other Ambulatory Visit: Payer: Self-pay | Admitting: Internal Medicine

## 2018-03-20 ENCOUNTER — Other Ambulatory Visit: Payer: Self-pay | Admitting: Internal Medicine

## 2018-03-20 DIAGNOSIS — I10 Essential (primary) hypertension: Secondary | ICD-10-CM

## 2018-03-22 ENCOUNTER — Other Ambulatory Visit: Payer: Self-pay | Admitting: Internal Medicine

## 2018-03-22 ENCOUNTER — Encounter: Payer: Self-pay | Admitting: Internal Medicine

## 2018-03-22 DIAGNOSIS — I10 Essential (primary) hypertension: Secondary | ICD-10-CM

## 2018-03-22 DIAGNOSIS — I5189 Other ill-defined heart diseases: Secondary | ICD-10-CM

## 2018-03-22 MED ORDER — AZILSARTAN MEDOXOMIL 80 MG PO TABS: 1.0000 | ORAL_TABLET | Freq: Every day | ORAL | 1 refills | Status: DC

## 2018-03-23 ENCOUNTER — Encounter: Payer: Self-pay | Admitting: Cardiovascular Disease

## 2018-03-28 ENCOUNTER — Other Ambulatory Visit: Payer: Self-pay | Admitting: Internal Medicine

## 2018-03-28 DIAGNOSIS — I10 Essential (primary) hypertension: Secondary | ICD-10-CM

## 2018-03-28 MED ORDER — LOSARTAN POTASSIUM 100 MG PO TABS: 100.0000 mg | ORAL_TABLET | Freq: Every day | ORAL | 1 refills | Status: DC

## 2018-04-04 ENCOUNTER — Encounter

## 2018-04-04 ENCOUNTER — Encounter: Payer: Self-pay | Admitting: Cardiovascular Disease

## 2018-04-04 ENCOUNTER — Ambulatory Visit: Payer: No Typology Code available for payment source | Admitting: Cardiovascular Disease

## 2018-04-04 ENCOUNTER — Encounter: Payer: Self-pay | Admitting: Internal Medicine

## 2018-04-04 VITALS — BP 130/88 | HR 100 | Ht 64.0 in | Wt 179.4 lb

## 2018-04-04 DIAGNOSIS — R002 Palpitations: Secondary | ICD-10-CM

## 2018-04-04 DIAGNOSIS — I5032 Chronic diastolic (congestive) heart failure: Secondary | ICD-10-CM | POA: Diagnosis not present

## 2018-04-04 DIAGNOSIS — I10 Essential (primary) hypertension: Secondary | ICD-10-CM | POA: Diagnosis not present

## 2018-04-04 DIAGNOSIS — E782 Mixed hyperlipidemia: Secondary | ICD-10-CM | POA: Diagnosis not present

## 2018-04-04 LAB — HEPATIC FUNCTION PANEL
ALT: 31 IU/L (ref 0–44)
AST: 25 IU/L (ref 0–40)
Albumin: 4.4 g/dL (ref 3.6–4.8)
Alkaline Phosphatase: 70 IU/L (ref 39–117)
BILIRUBIN TOTAL: 0.4 mg/dL (ref 0.0–1.2)
BILIRUBIN, DIRECT: 0.12 mg/dL (ref 0.00–0.40)
Total Protein: 6.8 g/dL (ref 6.0–8.5)

## 2018-04-04 LAB — BASIC METABOLIC PANEL
BUN/Creatinine Ratio: 17 (ref 10–24)
BUN: 18 mg/dL (ref 8–27)
CALCIUM: 9.1 mg/dL (ref 8.6–10.2)
CO2: 24 mmol/L (ref 20–29)
Chloride: 101 mmol/L (ref 96–106)
Creatinine, Ser: 1.07 mg/dL (ref 0.76–1.27)
GFR, EST AFRICAN AMERICAN: 83 mL/min/{1.73_m2} (ref >59)
GFR, EST NON AFRICAN AMERICAN: 71 mL/min/{1.73_m2} (ref >59)
Glucose: 115 mg/dL — ABNORMAL HIGH (ref 65–99)
Potassium: 4 mmol/L (ref 3.5–5.2)
Sodium: 141 mmol/L (ref 134–144)

## 2018-04-04 LAB — LIPID PANEL
CHOLESTEROL TOTAL: 218 mg/dL — AB (ref 100–199)
Chol/HDL Ratio: 6.6 ratio — ABNORMAL HIGH (ref 0.0–5.0)
HDL: 33 mg/dL — ABNORMAL LOW (ref >39)
LDL CALC: 141 mg/dL — AB (ref 0–99)
TRIGLYCERIDES: 219 mg/dL — AB (ref 0–149)
VLDL Cholesterol Cal: 44 mg/dL — ABNORMAL HIGH (ref 5–40)

## 2018-04-04 NOTE — Patient Instructions (Signed)
Medication Instructions:  Your physician recommends that you continue on your current medications as directed. Please refer to the Current Medication list given to you today.   Labwork: TODAY - basic metabolic panel, liver panel, cholesterol   Testing/Procedures: Your physician has recommended that you wear an event monitor. Event monitors are medical devices that record the heart's electrical activity. Doctors most often [place] these monitors to diagnose arrhythmias. Arrhythmias are problems with the speed or rhythm of the heartbeat. The monitor is a small, portable device. You can wear one while you do your normal daily activities. This is usually used to diagnose what is causing palpitations/syncope (passing out).   Follow-Up: Your physician recommends that you schedule a follow-up appointment in: 3 months with Dr. Acie Fredrickson   If you need a refill on your cardiac medications before your next appointment, please call your pharmacy.   Thank you for choosing [facility]! Christen Bame, RN [phone]

## 2018-04-04 NOTE — Progress Notes (Signed)
Cardiology Office Note:    Date:  04/04/2018   ID:  Robert Mccall, DOB 12-07-1951, MRN [id number]  PCP:  [name], MD  Cardiologist:  Mertie Moores, MD   Referring MD: [name], MD   Problem List 1. TIA 2. Chronic diastolic CHF 3. HTN 4. Hyperlipidemia    Chief Complaint  Patient presents with  . Palpitations    April 04, 2018:     Robert Mccall is a 67 y.o. male with a hx of hypertension, hyperlipidemia,  Chronic diastolic CHF  Resents for further evaluation of chest discomfort.  Tracks his BP regularly ,  BP cuff will alert him if his heart rate is irregular.  He can feel these palpitations.  They occur spontaneously and might last for as long as 15 to 20 minutes.  They typically resolve spontaneously.  These are not associated with any specific activity.  They are not associated with any particular time a day.  Does not do any regular exercise .   Works in [employer].     Past Medical History:  Diagnosis Date  . ADHD (attention deficit hyperactivity disorder)   . Anxiety   . Benign prostatic hypertrophy   . Chronic lower back pain    surgery schedule for back 10/21/16  . Cyst of right kidney    incidental [place] 1.3cm on R (MRI abd 09/2014), follows with uro  . Depression   . GERD (gastroesophageal reflux disease)   . Glaucoma   . History of migraine headaches    no meds  . Hyperlipidemia   . Hypertension     Past Surgical History:  Procedure Laterality Date  . COLONOSCOPY  09/2007   polyps/ Deatra Ina  . HYDROCELE EXCISION Right 1990  . LUMBAR DISC SURGERY  10/21/2016  . ORCHIECTOMY Left 1970  . WISDOM TOOTH EXTRACTION      Current Medications: Current Meds  Medication Sig  . Acetaminophen 500 MG coapsule Take 500 mg by mouth Nightly.  Marland Kitchen alfuzosin (UROXATRAL) 10 MG 24 hr tablet TAKE 1 TABLET(10 MG) BY MOUTH DAILY WITH BREAKFAST  . aspirin 81 MG tablet Take 81 mg by mouth daily.    Marland Kitchen BETIMOL 0.5 % ophthalmic solution   .  Cholecalciferol (VITAMIN D) 2000 UNITS CAPS Take 1 capsule (2,000 Units total) by mouth 1 dose over 46 hours. (Patient taking differently: Take 1 capsule by mouth daily. )  . diltiazem (CARDIZEM LA) 180 MG 24 hr tablet TAKE 1 TABLET(180 MG) BY MOUTH DAILY  . DULoxetine (CYMBALTA) 30 MG capsule TAKE ONE CAPSULE BY MOUTH EVERY DAY  . esomeprazole (NEXIUM) 40 MG capsule TAKE 1 CAPSULE(40MG ) BY MOUTH DAILY  . finasteride (PROSCAR) 5 MG tablet TAKE 1 TABLET BY MOUTH DAILY  . hydrochlorothiazide (HYDRODIURIL) 25 MG tablet Take 1 tablet (25 mg total) by mouth daily.  Marland Kitchen KRILL OIL 1000 MG CAPS Take 1,000 mg by mouth daily.   Marland Kitchen lamoTRIgine (LAMICTAL) 25 MG tablet TAKE 3 TABLETS BY MOUTH TWICE DAILY  . losartan (COZAAR) 100 MG tablet Take 1 tablet (100 mg total) by mouth daily.  . meclizine (ANTIVERT) 12.5 MG tablet Take 1 tablet (12.5 mg total) by mouth 3 (three) times daily as needed for dizziness.  . methylphenidate (RITALIN LA) 20 MG 24 hr capsule Take 1 capsule (20 mg total) by mouth every morning.  . Testosterone 20.25 MG/ACT (1.62%) GEL USE 2 ACTUATIONS ON THE SKIN DAILY  . travoprost, benzalkonium, (TRAVATAN) 0.004 % ophthalmic solution 1 drop  nightly.  . vitamin B-12 (CYANOCOBALAMIN) 1000 MCG tablet Take 1,000 mcg by mouth daily.     Allergies:   Patient has no known allergies.   Social History   Socioeconomic History  . Marital status: Single    Spouse name: Not on file  . Number of children: Not on file  . Years of education: Not on file  . Highest education level: Not on file  Occupational History  . Occupation: Freight forwarder    Comment: Geneticist, molecular  Social Needs  . Financial resource strain: Not on file  . Food insecurity:    Worry: Not on file    Inability: Not on file  . Transportation needs:    Medical: Not on file    Non-medical: Not on file  Tobacco Use  . Smoking status: Never Smoker  . Smokeless tobacco: Never Used  Substance and Sexual Activity  .  Alcohol use: Yes    Comment: 1-2 glasses wine per day  . Drug use: No  . Sexual activity: Yes    Comment: working with replacement  Lifestyle  . Physical activity:    Days per week: Not on file    Minutes per session: Not on file  . Stress: Not on file  Relationships  . Social connections:    Talks on phone: Not on file    Gets together: Not on file    Attends religious service: Not on file    Active member of club or organization: Not on file    Attends meetings of clubs or organizations: Not on file    Relationship status: Not on file  Other Topics Concern  . Not on file  Social History Narrative  . Not on file     Family History: The patient's family history includes Breast cancer in his mother; COPD in his mother; Depression in his mother; Diabetes in his father; Heart disease in his mother; Macular degeneration in his father; Osteoarthritis in his mother. There is no history of Colon cancer, Esophageal cancer, Rectal cancer, or Stomach cancer.  ROS:   Please see the history of present illness.     All other systems reviewed and are negative.  EKGs/Labs/Other Studies Reviewed:    The following studies were reviewed today:   EKG:  February 08, 2018:   NSR at 50.   No ST or T wave changes.   Recent Labs: 08/31/2017: Hemoglobin 14.8; Platelets 229; TSH 3.210 04/04/2018: ALT 31; BUN 18; Creatinine, Ser 1.07; Potassium 4.0; Sodium 141  Recent Lipid Panel    Component Value Date/Time   CHOL 218 (H) 04/04/2018 0931   TRIG 219 (H) 04/04/2018 0931   HDL 33 (L) 04/04/2018 0931   CHOLHDL 6.6 (H) 04/04/2018 0931   CHOLHDL 3 07/13/2017 0933   VLDL 21.0 07/13/2017 0933   LDLCALC 141 (H) 04/04/2018 0931    Physical Exam:    VS:  BP 130/88   Pulse 100   Ht 5\' 4"  (1.626 m)   Wt 179 lb 6.4 oz (81.4 kg)   SpO2 97%   BMI 30.79 kg/m     Wt Readings from Last 3 Encounters:  04/04/18 179 lb 6.4 oz (81.4 kg)  02/08/18 189 lb 2 oz (85.8 kg)  11/10/17 174 lb (78.9 kg)      GEN:  Well nourished, well developed in no acute distress, mildly anxioius  HEENT: Normal NECK: No JVD; No carotid bruits LYMPHATICS: No lymphadenopathy CARDIAC: RRR, no murmurs, rubs, gallops RESPIRATORY:  Clear to  auscultation without rales, wheezing or rhonchi  ABDOMEN: Soft, non-tender, non-distended MUSCULOSKELETAL:  No edema; No deformity  SKIN: Warm and dry NEUROLOGIC:  Alert and oriented x 3 PSYCHIATRIC:  Normal affect   ASSESSMENT:    1. Chronic diastolic heart failure (HCC)   2. Palpitations   3. Essential hypertension   4. Mixed hyperlipidemia    PLAN:    In order of problems listed above:  1. Palpitations - Santonio  presents for further evaluation of palpitations.  These occur at various times.  They last for several minutes.  We will place a 30-day event monitor for further evaluation.  2. Hypertension-blood pressure is fairly well controlled.  3.  Hyperlipidemia :   Patient had previously been on a statin but this was discontinued when he started diltiazem.  We will start him on Crestor 10 mg a day.   Medication Adjustments/Labs and Tests Ordered: Current medicines are reviewed at length with the patient today.  Concerns regarding medicines are outlined above.  Orders Placed This Encounter  Procedures  . Lipid Profile  . Basic Metabolic Panel (BMET)  . Hepatic function panel  . Cardiac event monitor   No orders of the defined types were placed in this encounter.   Patient Instructions  Medication Instructions:  Your physician recommends that you continue on your current medications as directed. Please refer to the Current Medication list given to you today.   Labwork: TODAY - basic metabolic panel, liver panel, cholesterol   Testing/Procedures: Your physician has recommended that you wear an event monitor. Event monitors are medical devices that record the heart's electrical activity. Doctors most often [place] these monitors to diagnose arrhythmias.  Arrhythmias are problems with the speed or rhythm of the heartbeat. The monitor is a small, portable device. You can wear one while you do your normal daily activities. This is usually used to diagnose what is causing palpitations/syncope (passing out).   Follow-Up: Your physician recommends that you schedule a follow-up appointment in: 3 months with Dr. Acie Fredrickson   If you need a refill on your cardiac medications before your next appointment, please call your pharmacy.   Thank you for choosing [facility]! Christen Bame, RN [phone]       Signed, Mertie Moores, MD  04/04/2018 5:00 PM    [place]

## 2018-04-05 ENCOUNTER — Telehealth: Payer: Self-pay | Admitting: Nurse Practitioner

## 2018-04-05 ENCOUNTER — Other Ambulatory Visit: Payer: Self-pay | Admitting: Internal Medicine

## 2018-04-05 DIAGNOSIS — F900 Attention-deficit hyperactivity disorder, predominantly inattentive type: Secondary | ICD-10-CM

## 2018-04-05 DIAGNOSIS — E781 Pure hyperglyceridemia: Secondary | ICD-10-CM

## 2018-04-05 DIAGNOSIS — E782 Mixed hyperlipidemia: Secondary | ICD-10-CM

## 2018-04-05 MED ORDER — METHYLPHENIDATE HCL ER (LA) 20 MG PO CP24: 20.0000 mg | ORAL_CAPSULE | ORAL | 0 refills | Status: DC

## 2018-04-05 MED ORDER — ROSUVASTATIN CALCIUM 10 MG PO TABS: 10.0000 mg | ORAL_TABLET | Freq: Every day | ORAL | 3 refills | Status: DC

## 2018-04-05 NOTE — Telephone Encounter (Signed)
Lab results and plan of care reviewed with patient who verbalized agreement. He is scheduled for repeat lab on 7/30 and has f/u with Dr. Acie Fredrickson on 8/1. I advised him to call back prior to follow-up with any questions or concerns. He thanked me for the call.

## 2018-04-05 NOTE — Telephone Encounter (Signed)
-----   Message from Thayer Headings, MD sent at 04/04/2018  5:03 PM EDT -----  LDL is higher than previous . Start rosuvastatin 10 mg a day  Check BMP, LFT, lipids in 3 months

## 2018-04-14 ENCOUNTER — Ambulatory Visit (INDEPENDENT_AMBULATORY_CARE_PROVIDER_SITE_OTHER): Payer: No Typology Code available for payment source

## 2018-04-14 DIAGNOSIS — R002 Palpitations: Secondary | ICD-10-CM | POA: Diagnosis not present

## 2018-04-14 DIAGNOSIS — I5032 Chronic diastolic (congestive) heart failure: Secondary | ICD-10-CM | POA: Diagnosis not present

## 2018-04-17 ENCOUNTER — Encounter: Payer: Self-pay | Admitting: Internal Medicine

## 2018-04-18 MED ORDER — ESOMEPRAZOLE MAGNESIUM 40 MG PO CPDR: 40.0000 mg | DELAYED_RELEASE_CAPSULE | Freq: Every day | ORAL | 0 refills | Status: DC

## 2018-04-24 ENCOUNTER — Other Ambulatory Visit: Payer: Self-pay | Admitting: Internal Medicine

## 2018-05-01 ENCOUNTER — Other Ambulatory Visit: Payer: Self-pay | Admitting: Internal Medicine

## 2018-05-01 DIAGNOSIS — I10 Essential (primary) hypertension: Secondary | ICD-10-CM

## 2018-05-19 ENCOUNTER — Other Ambulatory Visit: Payer: Self-pay | Admitting: Internal Medicine

## 2018-05-23 ENCOUNTER — Other Ambulatory Visit: Payer: Self-pay | Admitting: Internal Medicine

## 2018-06-13 ENCOUNTER — Encounter: Payer: Self-pay | Admitting: Cardiovascular Disease

## 2018-06-21 ENCOUNTER — Encounter: Payer: Self-pay | Admitting: Cardiovascular Disease

## 2018-07-05 ENCOUNTER — Other Ambulatory Visit: Payer: No Typology Code available for payment source

## 2018-07-07 ENCOUNTER — Ambulatory Visit: Payer: No Typology Code available for payment source | Admitting: Cardiovascular Disease

## 2018-07-15 ENCOUNTER — Encounter: Payer: Self-pay | Admitting: Internal Medicine

## 2018-07-18 ENCOUNTER — Other Ambulatory Visit: Payer: Self-pay | Admitting: Internal Medicine

## 2018-07-18 DIAGNOSIS — F900 Attention-deficit hyperactivity disorder, predominantly inattentive type: Secondary | ICD-10-CM

## 2018-07-18 DIAGNOSIS — I10 Essential (primary) hypertension: Secondary | ICD-10-CM

## 2018-07-18 MED ORDER — METHYLPHENIDATE HCL ER (LA) 20 MG PO CP24: 20.0000 mg | ORAL_CAPSULE | ORAL | 0 refills | Status: DC

## 2018-08-09 ENCOUNTER — Encounter: Payer: Self-pay | Admitting: Internal Medicine

## 2018-08-09 ENCOUNTER — Ambulatory Visit (INDEPENDENT_AMBULATORY_CARE_PROVIDER_SITE_OTHER): Payer: No Typology Code available for payment source | Admitting: Internal Medicine

## 2018-08-09 ENCOUNTER — Other Ambulatory Visit (INDEPENDENT_AMBULATORY_CARE_PROVIDER_SITE_OTHER): Payer: No Typology Code available for payment source

## 2018-08-09 VITALS — BP 122/88 | HR 97 | Temp 98.0°F | Ht 64.0 in | Wt 177.2 lb

## 2018-08-09 DIAGNOSIS — Z23 Encounter for immunization: Secondary | ICD-10-CM

## 2018-08-09 DIAGNOSIS — R7303 Prediabetes: Secondary | ICD-10-CM | POA: Diagnosis not present

## 2018-08-09 DIAGNOSIS — Z0000 Encounter for general adult medical examination without abnormal findings: Secondary | ICD-10-CM | POA: Diagnosis not present

## 2018-08-09 DIAGNOSIS — R351 Nocturia: Secondary | ICD-10-CM

## 2018-08-09 DIAGNOSIS — N401 Enlarged prostate with lower urinary tract symptoms: Secondary | ICD-10-CM

## 2018-08-09 DIAGNOSIS — I10 Essential (primary) hypertension: Secondary | ICD-10-CM

## 2018-08-09 DIAGNOSIS — E538 Deficiency of other specified B group vitamins: Secondary | ICD-10-CM

## 2018-08-09 DIAGNOSIS — N281 Cyst of kidney, acquired: Secondary | ICD-10-CM

## 2018-08-09 DIAGNOSIS — E785 Hyperlipidemia, unspecified: Secondary | ICD-10-CM

## 2018-08-09 LAB — HEMOGLOBIN A1C: HEMOGLOBIN A1C: 5.9 % (ref 4.6–6.5)

## 2018-08-09 LAB — TSH: TSH: 1.72 u[IU]/mL (ref 0.35–4.50)

## 2018-08-09 LAB — VITAMIN D 25 HYDROXY (VIT D DEFICIENCY, FRACTURES): VITD: 52.54 ng/mL (ref 30.00–100.00)

## 2018-08-09 LAB — PSA: PSA: 0.21 ng/mL (ref 0.10–4.00)

## 2018-08-09 MED ORDER — ASPIRIN 81 MG PO TABS: 81.0000 mg | ORAL_TABLET | Freq: Every day | ORAL | 1 refills | Status: DC

## 2018-08-09 NOTE — Progress Notes (Signed)
Subjective:  Patient ID: Robert Mccall, male    DOB: [birth year]  Age: 67 y.o. MRN: [id number]  CC: Annual Exam; Hypertension; and Hyperlipidemia   HPI Robert Mccall presents for a CPX.  He feels well today and offers no complaints.  He tells me he quit taking duloxetine because it was not beneficial.  His moods are stable on the combination of Ritalin and lamotrigine.  He is very active and denies any recent episodes of CP, DOE, palpitations, edema, or fatigue.  Outpatient Medications Prior to Visit  Medication Sig Dispense Refill  . Acetaminophen 500 MG coapsule Take 500 mg by mouth Nightly.    Marland Kitchen alfuzosin (UROXATRAL) 10 MG 24 hr tablet TAKE 1 TABLET(10 MG) BY MOUTH DAILY WITH BREAKFAST 90 tablet 0  . bimatoprost (LUMIGAN) 0.03 % ophthalmic solution INT 1 GTT INTO OD HS    . Cholecalciferol (VITAMIN D) 2000 UNITS CAPS Take 1 capsule (2,000 Units total) by mouth 1 dose over 46 hours. (Patient taking differently: Take 1 capsule by mouth daily. ) 30 capsule 11  . diltiazem (CARDIZEM LA) 180 MG 24 hr tablet TAKE 1 TABLET(180 MG) BY MOUTH DAILY 90 tablet 1  . esomeprazole (NEXIUM) 40 MG capsule Take 1 capsule (40 mg total) by mouth daily. 90 capsule 0  . finasteride (PROSCAR) 5 MG tablet TAKE 1 TABLET BY MOUTH DAILY 90 tablet 0  . hydrochlorothiazide (HYDRODIURIL) 25 MG tablet TAKE 1 TABLET(25 MG) BY MOUTH DAILY 90 tablet 0  . lamoTRIgine (LAMICTAL) 25 MG tablet TAKE 3 TABLETS BY MOUTH TWICE DAILY 540 tablet 1  . methylphenidate (RITALIN LA) 20 MG 24 hr capsule Take 1 capsule (20 mg total) by mouth every morning. 90 capsule 0  . olmesartan (BENICAR) 40 MG tablet     . Testosterone 20.25 MG/ACT (1.62%) GEL APPLY 2 ACTUATIONS ONTO THE SKIN DAILY 75 g 1  . timolol (TIMOPTIC) 0.5 % ophthalmic solution INT 1 GTT IN EACH EYE QAM  0  . vitamin B-12 (CYANOCOBALAMIN) 1000 MCG tablet Take 1,000 mcg by mouth daily.    . DULoxetine (CYMBALTA) 30 MG capsule TAKE ONE CAPSULE BY MOUTH EVERY DAY 90 capsule  0  . losartan (COZAAR) 100 MG tablet Take 1 tablet (100 mg total) by mouth daily. 90 tablet 1  . rosuvastatin (CRESTOR) 10 MG tablet Take 1 tablet (10 mg total) by mouth daily. 90 tablet 3  . aspirin 81 MG tablet Take 81 mg by mouth daily.      Marland Kitchen BETIMOL 0.5 % ophthalmic solution     . KRILL OIL 1000 MG CAPS Take 1,000 mg by mouth daily.     . meclizine (ANTIVERT) 12.5 MG tablet Take 1 tablet (12.5 mg total) by mouth 3 (three) times daily as needed for dizziness. 30 tablet 0  . travoprost, benzalkonium, (TRAVATAN) 0.004 % ophthalmic solution 1 drop nightly.     No facility-administered medications prior to visit.     ROS Review of Systems  Constitutional: Negative for diaphoresis, fatigue and unexpected weight change.  HENT: Negative.   Eyes: Negative for visual disturbance.  Respiratory: Negative for cough, chest tightness, shortness of breath and wheezing.   Cardiovascular: Negative for chest pain, palpitations and leg swelling.  Gastrointestinal: Negative for abdominal pain, constipation, diarrhea, nausea and vomiting.  Endocrine: Negative.  Negative for cold intolerance and heat intolerance.  Genitourinary: Negative.  Negative for difficulty urinating, scrotal swelling and testicular pain.  Musculoskeletal: Negative.  Negative for arthralgias and myalgias.  Skin: Negative.  Neurological: Negative.  Negative for dizziness, weakness, light-headedness and headaches.  Hematological: Negative for adenopathy. Does not bruise/bleed easily.  Psychiatric/Behavioral: Negative.  Negative for agitation, dysphoric mood, self-injury and suicidal ideas. The patient is not nervous/anxious.     Objective:  BP 122/88 (BP Location: Left Arm, Patient Position: Sitting, Cuff Size: Normal)   Pulse 97   Temp 98 F (36.7 C) (Oral)   Ht 5\' 4"  (1.626 m)   Wt 177 lb 4 oz (80.4 kg)   SpO2 97%   BMI 30.42 kg/m   BP Readings from Last 3 Encounters:  08/09/18 122/88  04/04/18 130/88  02/08/18 140/80     Wt Readings from Last 3 Encounters:  08/09/18 177 lb 4 oz (80.4 kg)  04/04/18 179 lb 6.4 oz (81.4 kg)  02/08/18 189 lb 2 oz (85.8 kg)    Physical Exam  Constitutional: He is oriented to person, place, and time. No distress.  HENT:  Mouth/Throat: Oropharynx is clear and moist. No oropharyngeal exudate.  Eyes: Conjunctivae are normal. No scleral icterus.  Neck: Normal range of motion. Neck supple. No JVD present. No thyromegaly present.  Cardiovascular: Normal rate, regular rhythm and normal heart sounds. Exam reveals no gallop and no friction rub.  No murmur heard. Pulmonary/Chest: Effort normal and breath sounds normal. No stridor. He has no wheezes. He exhibits no tenderness.  Abdominal: Soft. Normal appearance and bowel sounds are normal. He exhibits no mass. There is no hepatosplenomegaly. There is no tenderness. Hernia confirmed negative in the right inguinal area and confirmed negative in the left inguinal area.  Genitourinary: Rectum normal, prostate normal, testes normal and penis normal. Rectal exam shows no external hemorrhoid, no internal hemorrhoid, no fissure, no mass, no tenderness, anal tone normal and guaiac negative stool. Prostate is not enlarged and not tender. Right testis shows no mass, no swelling and no tenderness. Left testis shows no mass, no swelling and no tenderness. Circumcised. No penile erythema or penile tenderness. No discharge found.  Genitourinary Comments: The left hemiscrotum is empty  Musculoskeletal: Normal range of motion. He exhibits no edema, tenderness or deformity.  Lymphadenopathy:    He has no cervical adenopathy. No inguinal adenopathy noted on the right or left side.  Neurological: He is alert and oriented to person, place, and time.  Skin: Skin is warm and dry. No rash noted. He is not diaphoretic.  Psychiatric: He has a normal mood and affect. His behavior is normal. Judgment and thought content normal.  Vitals reviewed.   Lab Results    Component Value Date   WBC 6.0 08/31/2017   HGB 14.8 08/31/2017   HCT 43.8 08/31/2017   PLT 229 08/31/2017   GLUCOSE 115 (H) 04/04/2018   CHOL 218 (H) 04/04/2018   TRIG 219 (H) 04/04/2018   HDL 33 (L) 04/04/2018   LDLCALC 141 (H) 04/04/2018   ALT 31 04/04/2018   AST 25 04/04/2018   NA 141 04/04/2018   K 4.0 04/04/2018   CL 101 04/04/2018   CREATININE 1.07 04/04/2018   BUN 18 04/04/2018   CO2 24 04/04/2018   TSH 1.72 08/09/2018   PSA 0.21 08/09/2018   INR 1.04 01/23/2017   HGBA1C 5.9 08/09/2018    No results found.  Assessment & Plan:   Robert Mccall was seen today for annual exam, hypertension and hyperlipidemia.  Diagnoses and all orders for this visit:  Hyperlipidemia with target LDL less than 130- He has an upcoming appointment with cardiology at which time his lipids will  be rechecked.  I reiterated the need to take a baby aspirin a day for CV risk reduction. -     aspirin 81 MG tablet; Take 1 tablet (81 mg total) by mouth daily. -     TSH; Future  Essential hypertension- His blood pressure is adequately well controlled. -     Cancel: Urinalysis, Routine w reflex microscopic; Future -     VITAMIN D 25 Hydroxy (Vit-D Deficiency, Fractures); Future  Kidney cysts- This is followed closely by urology. -     Cancel: Urinalysis, Routine w reflex microscopic; Future  Prediabetes- His A1c is up to 5.9%.  Medical therapy is not indicated. -     Hemoglobin A1c; Future  BPH associated with nocturia- His PSA remains low which is reassuring that he does not have prostate cancer.  Medical therapy is not indicated. -     PSA; Future  B12 deficiency  Need for influenza vaccination -     Flu vaccine HIGH DOSE PF (Fluzone High dose)  Routine general medical examination at a health care facility- Exam completed, labs reviewed, vaccines reviewed and updated, screening for colon cancer is up-to-date, patient education material was given.   I have discontinued Robert Mccall's Krill  Oil, meclizine, BETIMOL, travoprost (benzalkonium), losartan, and DULoxetine. I have also changed his aspirin. Additionally, I am having him maintain his Vitamin D, vitamin B-12, Acetaminophen, diltiazem, rosuvastatin, esomeprazole, lamoTRIgine, alfuzosin, Testosterone, finasteride, hydrochlorothiazide, methylphenidate, bimatoprost, timolol, and olmesartan.  Meds ordered this encounter  Medications  . aspirin 81 MG tablet    Sig: Take 1 tablet (81 mg total) by mouth daily.    Dispense:  90 tablet    Refill:  1     Follow-up: Return in about 6 months (around 02/07/2019).  Scarlette Calico, MD

## 2018-08-09 NOTE — Patient Instructions (Signed)

## 2018-08-13 ENCOUNTER — Other Ambulatory Visit: Payer: Self-pay | Admitting: Internal Medicine

## 2018-08-16 ENCOUNTER — Encounter: Payer: Self-pay | Admitting: Hematology and Oncology

## 2018-08-16 ENCOUNTER — Telehealth: Payer: Self-pay | Admitting: Hematology and Oncology

## 2018-08-16 NOTE — Telephone Encounter (Signed)
NG PAL - MOVED f/u FROM 9/20 TO 9/26. Lab 9/13 remains the same. Left message. Schedule mailed.

## 2018-08-19 ENCOUNTER — Inpatient Hospital Stay: Payer: No Typology Code available for payment source | Attending: Hematology and Oncology

## 2018-08-19 DIAGNOSIS — Z79899 Other long term (current) drug therapy: Secondary | ICD-10-CM | POA: Insufficient documentation

## 2018-08-19 DIAGNOSIS — E538 Deficiency of other specified B group vitamins: Secondary | ICD-10-CM

## 2018-08-19 DIAGNOSIS — G629 Polyneuropathy, unspecified: Secondary | ICD-10-CM | POA: Diagnosis not present

## 2018-08-19 DIAGNOSIS — D472 Monoclonal gammopathy: Secondary | ICD-10-CM | POA: Diagnosis present

## 2018-08-19 DIAGNOSIS — G8929 Other chronic pain: Secondary | ICD-10-CM | POA: Insufficient documentation

## 2018-08-19 DIAGNOSIS — Z7982 Long term (current) use of aspirin: Secondary | ICD-10-CM | POA: Diagnosis not present

## 2018-08-19 LAB — CBC WITH DIFFERENTIAL/PLATELET
Basophils Absolute: 0 10*3/uL (ref 0.0–0.1)
Basophils Relative: 0 %
Eosinophils Absolute: 0.2 10*3/uL (ref 0.0–0.5)
Eosinophils Relative: 3 %
HCT: 37.9 % — ABNORMAL LOW (ref 38.4–49.9)
HEMOGLOBIN: 13.2 g/dL (ref 13.0–17.1)
LYMPHS ABS: 2.6 10*3/uL (ref 0.9–3.3)
LYMPHS PCT: 38 %
MCH: 30.5 pg (ref 27.2–33.4)
MCHC: 34.8 g/dL (ref 32.0–36.0)
MCV: 87.5 fL (ref 79.3–98.0)
Monocytes Absolute: 0.8 10*3/uL (ref 0.1–0.9)
Monocytes Relative: 11 %
NEUTROS PCT: 48 %
Neutro Abs: 3.3 10*3/uL (ref 1.5–6.5)
Platelets: 276 10*3/uL (ref 140–400)
RBC: 4.33 MIL/uL (ref 4.20–5.82)
RDW: 12.1 % (ref 11.0–14.6)
WBC: 6.9 10*3/uL (ref 4.0–10.3)

## 2018-08-19 LAB — COMPREHENSIVE METABOLIC PANEL
ALBUMIN: 4 g/dL (ref 3.5–5.0)
ALT: 24 U/L (ref 0–44)
ANION GAP: 10 (ref 5–15)
AST: 22 U/L (ref 15–41)
Alkaline Phosphatase: 63 U/L (ref 38–126)
BUN: 18 mg/dL (ref 8–23)
CHLORIDE: 102 mmol/L (ref 98–111)
CO2: 28 mmol/L (ref 22–32)
Calcium: 9.4 mg/dL (ref 8.9–10.3)
Creatinine, Ser: 1.16 mg/dL (ref 0.61–1.24)
GFR calc Af Amer: >60 mL/min (ref >60)
GFR calc non Af Amer: >60 mL/min (ref >60)
GLUCOSE: 130 mg/dL — AB (ref 70–99)
POTASSIUM: 3.7 mmol/L (ref 3.5–5.1)
SODIUM: 140 mmol/L (ref 135–145)
Total Bilirubin: 0.5 mg/dL (ref 0.3–1.2)
Total Protein: 7.1 g/dL (ref 6.5–8.1)

## 2018-08-19 LAB — VITAMIN B12: Vitamin B-12: 2858 pg/mL — ABNORMAL HIGH (ref 180–914)

## 2018-08-22 LAB — KAPPA/LAMBDA LIGHT CHAINS
Kappa free light chain: 14 mg/L (ref 3.3–19.4)
Kappa, lambda light chain ratio: 1.01 (ref 0.26–1.65)
Lambda free light chains: 13.8 mg/L (ref 5.7–26.3)

## 2018-08-22 LAB — MULTIPLE MYELOMA PANEL, SERUM
ALBUMIN/GLOB SERPL: 1.3 (ref 0.7–1.7)
ALPHA 1: 0.2 g/dL (ref 0.0–0.4)
Albumin SerPl Elph-Mcnc: 3.6 g/dL (ref 2.9–4.4)
Alpha2 Glob SerPl Elph-Mcnc: 0.8 g/dL (ref 0.4–1.0)
B-GLOBULIN SERPL ELPH-MCNC: 1 g/dL (ref 0.7–1.3)
GAMMA GLOB SERPL ELPH-MCNC: 0.9 g/dL (ref 0.4–1.8)
GLOBULIN, TOTAL: 2.9 g/dL (ref 2.2–3.9)
IGA: 138 mg/dL (ref 61–437)
IgG (Immunoglobin G), Serum: 944 mg/dL (ref 700–1600)
IgM (Immunoglobulin M), Srm: 115 mg/dL (ref 20–172)
M PROTEIN SERPL ELPH-MCNC: 0.3 g/dL — AB
Total Protein ELP: 6.5 g/dL (ref 6.0–8.5)

## 2018-08-25 ENCOUNTER — Telehealth: Payer: Self-pay

## 2018-08-25 ENCOUNTER — Encounter: Payer: Self-pay | Admitting: Hematology and Oncology

## 2018-08-25 NOTE — Telephone Encounter (Signed)
Called to clarify mychart message regarding appt for 9/26 at 0800. He states that he will keep the appt on 9/26 at the scheduled time.

## 2018-08-26 ENCOUNTER — Ambulatory Visit: Payer: Self-pay | Admitting: Hematology and Oncology

## 2018-09-01 ENCOUNTER — Telehealth: Payer: Self-pay | Admitting: Hematology and Oncology

## 2018-09-01 ENCOUNTER — Inpatient Hospital Stay (HOSPITAL_BASED_OUTPATIENT_CLINIC_OR_DEPARTMENT_OTHER): Payer: No Typology Code available for payment source | Admitting: Hematology and Oncology

## 2018-09-01 ENCOUNTER — Encounter: Payer: Self-pay | Admitting: Hematology and Oncology

## 2018-09-01 DIAGNOSIS — Z7982 Long term (current) use of aspirin: Secondary | ICD-10-CM

## 2018-09-01 DIAGNOSIS — E538 Deficiency of other specified B group vitamins: Secondary | ICD-10-CM | POA: Diagnosis not present

## 2018-09-01 DIAGNOSIS — G8929 Other chronic pain: Secondary | ICD-10-CM | POA: Diagnosis not present

## 2018-09-01 DIAGNOSIS — D472 Monoclonal gammopathy: Secondary | ICD-10-CM

## 2018-09-01 DIAGNOSIS — G629 Polyneuropathy, unspecified: Secondary | ICD-10-CM

## 2018-09-01 DIAGNOSIS — Z79899 Other long term (current) drug therapy: Secondary | ICD-10-CM

## 2018-09-01 DIAGNOSIS — G63 Polyneuropathy in diseases classified elsewhere: Secondary | ICD-10-CM

## 2018-09-01 NOTE — Assessment & Plan Note (Signed)
The patient has peripheral neuropathy of unknown reason.  It is unlikely related to MGUS The last piece of the possible would be to consider bone marrow biopsy to exclude amyloidosis although the chances of this finding is low.  For now, I do not recommend bone marrow biopsy.

## 2018-09-01 NOTE — Assessment & Plan Note (Signed)
The patient is noted to have IgG lambda MGUS. According to his recent blood work, and there were nothing to suggest end organ damage. So far, he has no signs of disease progression I spent a lot of time educating the patient the natural history of MGUS I will see him once a year with repeat labs and examination

## 2018-09-01 NOTE — Telephone Encounter (Signed)
Per 9/25 los.  Mailed calendar for next year's appt.

## 2018-09-01 NOTE — Progress Notes (Signed)
[place] OFFICE PROGRESS NOTE  Patient Care Team: [name], MD as PCP - General (Internal Medicine) Nahser, Wonda Cheng, MD as PCP - Cardiology (Cardiology) Inda Castle, MD (Inactive) (Gastroenterology) Camillo Flaming, OD (Optometry) Carolan Clines, MD as Consulting Physician (Urology) Rozetta Nunnery, MD (Otolaryngology)  ASSESSMENT & PLAN:  MGUS (monoclonal gammopathy of unknown significance) The patient is noted to have IgG lambda MGUS. According to his recent blood work, and there were nothing to suggest end organ damage. 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  It is unlikely related to MGUS The last piece of the possible would be to consider bone marrow biopsy to exclude amyloidosis although the chances of this finding is low.  For now, I do not recommend bone marrow biopsy.   No orders of the defined types were placed in this encounter.   INTERVAL HISTORY: Please see below for problem oriented charting. He returns for further follow-up He underwent neck surgery for right hand numbness.  It has not improved his symptoms. No new bone pain.  No progression of neuropathy in his feet.  SUMMARY OF ONCOLOGIC HISTORY:  The patient was feeling unwell with sensation of dizziness. He was referred to neurologist for further evaluation. Incidentally, during his physical examination, he also noted history of reduced sensation in his feet and numbness at the end of toes.  The patient has history of chronic back pain and was treated  with gabapentin for his back pain. His neurologist perform significant workup including blood work which showed IgG lambda MGUS. He is being referred here for further evaluation He denies history of abnormal bone pain or bone fracture. Patient denies history of recurrent infection or atypical infections such as shingles of meningitis. Denies chills, night sweats, anorexia or abnormal weight loss. Repeat blood work and skeletal survey in September 2017 show no evidence of end organ damage  He was recommended observation He was also noted to have borderline B12 deficiency and was recommended oral vitamin B12 replacement therapy  REVIEW OF SYSTEMS:   Constitutional: Denies fevers, chills or abnormal weight loss Eyes: Denies blurriness of vision Ears, nose, mouth, throat, and face: Denies mucositis or sore throat Respiratory: Denies cough, dyspnea or wheezes Cardiovascular: Denies palpitation, chest discomfort or lower extremity swelling Gastrointestinal:  Denies nausea, heartburn or change in bowel habits Skin: Denies abnormal skin rashes Lymphatics: Denies new lymphadenopathy or easy bruising Behavioral/Psych: Mood is stable, no new changes  All other systems were reviewed with the patient and are negative.  I have reviewed the past medical history, past surgical history, social history and family history with the patient and they are unchanged from previous note.  ALLERGIES:  has No Known Allergies.  MEDICATIONS:  Current Outpatient Medications  Medication Sig Dispense Refill  . Acetaminophen 500 MG coapsule Take 500 mg by mouth Nightly.    Marland Kitchen alfuzosin (UROXATRAL) 10 MG 24 hr tablet TAKE 1 TABLET(10 MG) BY MOUTH DAILY WITH BREAKFAST 90 tablet 1  . aspirin 81 MG tablet Take 1 tablet (81 mg total) by mouth daily. 90 tablet  1  . bimatoprost (LUMIGAN) 0.03 % ophthalmic solution INT 1 GTT INTO OD HS    . Cholecalciferol (VITAMIN D) 2000 UNITS CAPS Take 1 capsule (2,000 Units total) by mouth  1 dose over 46 hours. (Patient taking differently: Take 1 capsule by mouth daily. ) 30 capsule 11  . diltiazem (CARDIZEM LA) 180 MG 24 hr tablet TAKE 1 TABLET(180 MG) BY MOUTH DAILY 90 tablet 1  . esomeprazole (NEXIUM) 40 MG capsule Take 1 capsule (40 mg total) by mouth daily. 90 capsule 0  . finasteride (PROSCAR) 5 MG tablet TAKE 1 TABLET BY MOUTH DAILY 90 tablet 0  . hydrochlorothiazide (HYDRODIURIL) 25 MG tablet TAKE 1 TABLET(25 MG) BY MOUTH DAILY 90 tablet 0  . lamoTRIgine (LAMICTAL) 25 MG tablet TAKE 3 TABLETS BY MOUTH TWICE DAILY 540 tablet 1  . methylphenidate (RITALIN LA) 20 MG 24 hr capsule Take 1 capsule (20 mg total) by mouth every morning. 90 capsule 0  . olmesartan (BENICAR) 40 MG tablet     . rosuvastatin (CRESTOR) 10 MG tablet Take 1 tablet (10 mg total) by mouth daily. 90 tablet 3  . Testosterone 20.25 MG/ACT (1.62%) GEL APPLY 2 ACTUATIONS ONTO THE SKIN DAILY 75 g 1  . timolol (TIMOPTIC) 0.5 % ophthalmic solution INT 1 GTT IN EACH EYE QAM  0  . vitamin B-12 (CYANOCOBALAMIN) 1000 MCG tablet Take 1,000 mcg by mouth daily.     No current facility-administered medications for this visit.     PHYSICAL EXAMINATION: ECOG PERFORMANCE STATUS: 0 - Asymptomatic  Vitals:   09/01/18 0754  BP: 125/79  Pulse: 84  Resp: 18  Temp: 98.7 F (37.1 C)  SpO2: 100%   Filed Weights   09/01/18 0754  Weight: 175 lb 9.6 oz (79.7 kg)    GENERAL:alert, no distress and comfortable Musculoskeletal:no cyanosis of digits and no clubbing  NEURO: alert & oriented x 3 with fluent speech, no focal motor/sensory deficits  LABORATORY DATA:  I have reviewed the data as listed    Component Value Date/Time   NA 140 08/19/2018 0803   NA 141 04/04/2018 0931   NA 140 08/18/2016 0953   K 3.7 08/19/2018 0803   K 3.5 08/18/2016 0953   CL 102 08/19/2018 0803   CO2 28 08/19/2018 0803   CO2 26 08/18/2016 0953   GLUCOSE 130 (H) 08/19/2018 0803   GLUCOSE 116 08/18/2016 0953   BUN 18 08/19/2018 0803    BUN 18 04/04/2018 0931   BUN 17.2 08/18/2016 0953   CREATININE 1.16 08/19/2018 0803   CREATININE 1.1 08/18/2016 0953   CALCIUM 9.4 08/19/2018 0803   CALCIUM 9.2 08/18/2016 0953   PROT 7.1 08/19/2018 0803   PROT 6.8 04/04/2018 0931   PROT 7.6 08/18/2016 0953   ALBUMIN 4.0 08/19/2018 0803   ALBUMIN 4.4 04/04/2018 0931   ALBUMIN 3.9 08/18/2016 0953   AST 22 08/19/2018 0803   AST 21 08/18/2016 0953   ALT 24 08/19/2018 0803   ALT 24 08/18/2016 0953   ALKPHOS 63 08/19/2018 0803   ALKPHOS 59 08/18/2016 0953   BILITOT 0.5 08/19/2018 0803   BILITOT 0.4 04/04/2018 0931   BILITOT 0.95 08/18/2016 0953   GFRNONAA >60 08/19/2018 0803   GFRAA >60 08/19/2018 0803    No results found for: SPEP, UPEP  Lab Results  Component Value Date   WBC 6.9 08/19/2018   NEUTROABS 3.3 08/19/2018   HGB 13.2 08/19/2018   HCT 37.9 (L) 08/19/2018   MCV 87.5 08/19/2018  PLT 276 08/19/2018      Chemistry      Component Value Date/Time   NA 140 08/19/2018 0803   NA 141 04/04/2018 0931   NA 140 08/18/2016 0953   K 3.7 08/19/2018 0803   K 3.5 08/18/2016 0953   CL 102 08/19/2018 0803   CO2 28 08/19/2018 0803   CO2 26 08/18/2016 0953   BUN 18 08/19/2018 0803   BUN 18 04/04/2018 0931   BUN 17.2 08/18/2016 0953   CREATININE 1.16 08/19/2018 0803   CREATININE 1.1 08/18/2016 0953   GLU 102 08/02/2015      Component Value Date/Time   CALCIUM 9.4 08/19/2018 0803   CALCIUM 9.2 08/18/2016 0953   ALKPHOS 63 08/19/2018 0803   ALKPHOS 59 08/18/2016 0953   AST 22 08/19/2018 0803   AST 21 08/18/2016 0953   ALT 24 08/19/2018 0803   ALT 24 08/18/2016 0953   BILITOT 0.5 08/19/2018 0803   BILITOT 0.4 04/04/2018 0931   BILITOT 0.95 08/18/2016 0953       All questions were answered. The patient knows to call the clinic with any problems, questions or concerns. No barriers to learning was detected.  I spent 10 minutes counseling the patient face to face. The total time spent in the appointment was 15  minutes and more than 50% was on counseling and review of test results  Heath Lark, MD 09/01/2018 8:23 AM

## 2018-09-01 NOTE — Assessment & Plan Note (Signed)
She had borderline B12 deficiency in the past He is on oral B12 replacement therapy His last serum vitamin B12 was adequate

## 2018-09-14 ENCOUNTER — Other Ambulatory Visit: Payer: Self-pay | Admitting: Internal Medicine

## 2018-09-14 DIAGNOSIS — I10 Essential (primary) hypertension: Secondary | ICD-10-CM

## 2018-09-15 ENCOUNTER — Encounter: Payer: Self-pay | Admitting: Internal Medicine

## 2018-09-17 ENCOUNTER — Other Ambulatory Visit: Payer: Self-pay | Admitting: Internal Medicine

## 2018-09-17 DIAGNOSIS — F900 Attention-deficit hyperactivity disorder, predominantly inattentive type: Secondary | ICD-10-CM

## 2018-09-17 MED ORDER — METHYLPHENIDATE HCL ER (LA) 20 MG PO CP24: 20.0000 mg | ORAL_CAPSULE | ORAL | 0 refills | Status: DC

## 2018-09-20 ENCOUNTER — Encounter: Payer: Self-pay | Admitting: Internal Medicine

## 2018-09-20 MED ORDER — ALFUZOSIN HCL ER 10 MG PO TB24: 10.0000 mg | ORAL_TABLET | Freq: Every day | ORAL | 1 refills | Status: DC

## 2018-09-21 MED ORDER — OLMESARTAN MEDOXOMIL 40 MG PO TABS: 40.0000 mg | ORAL_TABLET | Freq: Every day | ORAL | 1 refills | Status: DC

## 2018-09-21 NOTE — Addendum Note (Signed)
Addended by: Karle Barr on: 09/21/2018 02:37 PM   Modules accepted: Orders

## 2018-09-23 ENCOUNTER — Other Ambulatory Visit: Payer: No Typology Code available for payment source | Admitting: *Deleted

## 2018-09-23 DIAGNOSIS — E781 Pure hyperglyceridemia: Secondary | ICD-10-CM

## 2018-09-23 DIAGNOSIS — E782 Mixed hyperlipidemia: Secondary | ICD-10-CM

## 2018-09-23 LAB — LIPID PANEL
CHOL/HDL RATIO: 3 ratio (ref 0.0–5.0)
Cholesterol, Total: 114 mg/dL (ref 100–199)
HDL: 38 mg/dL — ABNORMAL LOW (ref >39)
LDL CALC: 61 mg/dL (ref 0–99)
Triglycerides: 75 mg/dL (ref 0–149)
VLDL Cholesterol Cal: 15 mg/dL (ref 5–40)

## 2018-09-23 LAB — BASIC METABOLIC PANEL
BUN/Creatinine Ratio: 13 (ref 10–24)
BUN: 14 mg/dL (ref 8–27)
CALCIUM: 9.5 mg/dL (ref 8.6–10.2)
CHLORIDE: 101 mmol/L (ref 96–106)
CO2: 23 mmol/L (ref 20–29)
Creatinine, Ser: 1.09 mg/dL (ref 0.76–1.27)
GFR calc non Af Amer: 70 mL/min/{1.73_m2} (ref >59)
GFR, EST AFRICAN AMERICAN: 81 mL/min/{1.73_m2} (ref >59)
Glucose: 118 mg/dL — ABNORMAL HIGH (ref 65–99)
POTASSIUM: 4.1 mmol/L (ref 3.5–5.2)
Sodium: 141 mmol/L (ref 134–144)

## 2018-09-23 LAB — HEPATIC FUNCTION PANEL
ALBUMIN: 4.5 g/dL (ref 3.6–4.8)
ALT: 32 IU/L (ref 0–44)
AST: 20 IU/L (ref 0–40)
Alkaline Phosphatase: 63 IU/L (ref 39–117)
BILIRUBIN TOTAL: 0.2 mg/dL (ref 0.0–1.2)
Bilirubin, Direct: 0.08 mg/dL (ref 0.00–0.40)
Total Protein: 6.7 g/dL (ref 6.0–8.5)

## 2018-09-27 ENCOUNTER — Ambulatory Visit: Payer: No Typology Code available for payment source | Admitting: Cardiovascular Disease

## 2018-09-28 ENCOUNTER — Encounter: Payer: Self-pay | Admitting: Cardiovascular Disease

## 2018-09-28 ENCOUNTER — Ambulatory Visit (INDEPENDENT_AMBULATORY_CARE_PROVIDER_SITE_OTHER): Payer: No Typology Code available for payment source | Admitting: Cardiovascular Disease

## 2018-09-28 DIAGNOSIS — I5032 Chronic diastolic (congestive) heart failure: Secondary | ICD-10-CM | POA: Diagnosis not present

## 2018-09-28 NOTE — Patient Instructions (Signed)
Medication Instructions:  Your physician recommends that you continue on your current medications as directed. Please refer to the Current Medication list given to you today.  If you need a refill on your cardiac medications before your next appointment, please call your pharmacy.   Lab work: None Ordered   Testing/Procedures: None Ordered   Follow-Up: At [facility], you and your health needs are our priority.  As part of our continuing mission to provide you with exceptional heart care, we have created designated Provider Care Teams.  These Care Teams include your primary Cardiologist (physician) and Advanced Practice Providers (APPs -  Physician Assistants and Nurse Practitioners) who all work together to provide you with the care you need, when you need it. You will need a follow up appointment in:  1 year.  Please call our office 2 months in advance to schedule this appointment.  You may see Philip Nahser, MD or one of the following Advanced Practice Providers on your designated Care Team: Scott Weaver, PA-C Vin Bhagat, PA-C . Janine Hammond, NP   

## 2018-09-28 NOTE — Progress Notes (Signed)
Cardiology Office Note:    Date:  09/28/2018   ID:  Robert Mccall, DOB Sep 23, 1951, MRN [id number]  PCP:  Robert Lima, MD  Cardiologist:  Robert Moores, MD   Referring MD: Robert Lima, MD   Problem List 1. TIA 2. Chronic diastolic CHF 3. HTN 4. Hyperlipidemia    Chief Complaint  Patient presents with  . Congestive Heart Failure    April 04, 2018:     Robert Mccall is a 67 y.o. male with a hx of hypertension, hyperlipidemia,  Chronic diastolic CHF  Resents for further evaluation of chest discomfort.  Tracks his BP regularly ,  BP cuff will alert him if his heart rate is irregular.  He can feel these palpitations.  They occur spontaneously and might last for as long as 15 to 20 minutes.  They typically resolve spontaneously.  These are not associated with any specific activity.  They are not associated with any particular time a day.  Does not do any regular exercise .   Works in [employer].   Oct. 23, 2019:  Has been having trouble with his hearing that she has Mnire's disease.  He denies any episodes of chest pain or shortness of breath.  Blood pressure and heart rate are well controlled today.  Lipids from last week look good on Crestor 10 a day     Past Medical History:  Diagnosis Date  . ADHD (attention deficit hyperactivity disorder)   . Anxiety   . Benign prostatic hypertrophy   . Chronic lower back pain    surgery schedule for back 10/21/16  . Cyst of right kidney    incidental [place] 1.3cm on R (MRI abd 09/2014), follows with uro  . Depression   . GERD (gastroesophageal reflux disease)   . Glaucoma   . History of migraine headaches    no meds  . Hyperlipidemia   . Hypertension     Past Surgical History:  Procedure Laterality Date  . COLONOSCOPY  09/2007   polyps/ Robert Mccall  . HYDROCELE EXCISION Right 1990  . LUMBAR DISC SURGERY  10/21/2016  . ORCHIECTOMY Left 1970  . WISDOM TOOTH EXTRACTION      Current Medications: Current Meds   Medication Sig  . Acetaminophen 500 MG coapsule Take 500 mg by mouth Nightly.  Marland Kitchen alfuzosin (UROXATRAL) 10 MG 24 hr tablet Take 1 tablet (10 mg total) by mouth daily with breakfast.  . aspirin 81 MG tablet Take 1 tablet (81 mg total) by mouth daily.  . bimatoprost (LUMIGAN) 0.03 % ophthalmic solution Place 1 drop into both eyes daily.   . Cholecalciferol (VITAMIN D) 2000 UNITS CAPS Take 1 capsule (2,000 Units total) by mouth 1 dose over 46 hours.  Marland Kitchen diltiazem (CARDIZEM LA) 180 MG 24 hr tablet TAKE 1 TABLET(180 MG) BY MOUTH DAILY  . esomeprazole (NEXIUM) 40 MG capsule Take 1 capsule (40 mg total) by mouth daily.  . finasteride (PROSCAR) 5 MG tablet TAKE 1 TABLET BY MOUTH DAILY  . hydrochlorothiazide (HYDRODIURIL) 25 MG tablet TAKE 1 TABLET(25 MG) BY MOUTH DAILY  . lamoTRIgine (LAMICTAL) 25 MG tablet TAKE 3 TABLETS BY MOUTH TWICE DAILY  . methylphenidate (RITALIN LA) 20 MG 24 hr capsule Take 1 capsule (20 mg total) by mouth every morning.  . olmesartan (BENICAR) 40 MG tablet Take 1 tablet (40 mg total) by mouth daily.  . rosuvastatin (CRESTOR) 10 MG tablet Take 1 tablet (10 mg total) by mouth daily.  . Testosterone 20.25  MG/ACT (1.62%) GEL APPLY 2 ACTUATIONS ONTO THE SKIN DAILY  . timolol (TIMOPTIC) 0.5 % ophthalmic solution INT 1 GTT IN EACH EYE QAM  . vitamin B-12 (CYANOCOBALAMIN) 1000 MCG tablet Take 1,000 mcg by mouth daily.     Allergies:   Patient has no known allergies.   Social History   Socioeconomic History  . Marital status: Single    Spouse name: Not on file  . Number of children: Not on file  . Years of education: Not on file  . Highest education level: Not on file  Occupational History  . Occupation: Freight forwarder    Comment: Geneticist, molecular  Social Needs  . Financial resource strain: Not on file  . Food insecurity:    Worry: Not on file    Inability: Not on file  . Transportation needs:    Medical: Not on file    Non-medical: Not on file  Tobacco Use  .  Smoking status: Never Smoker  . Smokeless tobacco: Never Used  Substance and Sexual Activity  . Alcohol use: Yes    Comment: 1-2 glasses wine per day  . Drug use: No  . Sexual activity: Yes    Comment: working with replacement  Lifestyle  . Physical activity:    Days per week: Not on file    Minutes per session: Not on file  . Stress: Not on file  Relationships  . Social connections:    Talks on phone: Not on file    Gets together: Not on file    Attends religious service: Not on file    Active member of club or organization: Not on file    Attends meetings of clubs or organizations: Not on file    Relationship status: Not on file  Other Topics Concern  . Not on file  Social History Narrative  . Not on file     Family History: The patient's family history includes Breast cancer in his mother; COPD in his mother; Depression in his mother; Diabetes in his father; Heart disease in his mother; Macular degeneration in his father; Osteoarthritis in his mother. There is no history of Colon cancer, Esophageal cancer, Rectal cancer, or Stomach cancer.  ROS:   Please see the history of present illness.     All other systems reviewed and are negative.  EKGs/Labs/Other Studies Reviewed:    The following studies were reviewed today:   EKG:  February 08, 2018:   NSR at 68.   No ST or T wave changes.   Recent Labs: 08/09/2018: TSH 1.72 08/19/2018: Hemoglobin 13.2; Platelets 276 09/23/2018: ALT 32; BUN 14; Creatinine, Ser 1.09; Potassium 4.1; Sodium 141  Recent Lipid Panel    Component Value Date/Time   CHOL 114 09/23/2018 0853   TRIG 75 09/23/2018 0853   HDL 38 (L) 09/23/2018 0853   CHOLHDL 3.0 09/23/2018 0853   CHOLHDL 3 07/13/2017 0933   VLDL 21.0 07/13/2017 0933   LDLCALC 61 09/23/2018 0853    Physical Exam: Blood pressure 110/70, pulse 88, height 5\' 4"  (1.626 m), weight 176 lb 1.9 oz (79.9 kg), SpO2 95 %.  GEN:  Well nourished, well developed in no acute distress HEENT:  Normal NECK: No JVD; No carotid bruits LYMPHATICS: No lymphadenopathy CARDIAC: RRR   RESPIRATORY:  Clear to auscultation without rales, wheezing or rhonchi  ABDOMEN: Soft, non-tender, non-distended MUSCULOSKELETAL:  No edema; No deformity  SKIN: Warm and dry NEUROLOGIC:  Alert and oriented x 3   ASSESSMENT:  No diagnosis found. PLAN:    In order of problems listed above:  1. Palpitations -  Better   2. Hypertension- BP is well controlled.   3.  Hyperlipidemia :    Lipids look great - on Crestor   4.   Chronic diastolic CHF:   Better.   No dyspnea  Continue diltiazem     Medication Adjustments/Labs and Tests Ordered: Current medicines are reviewed at length with the patient today.  Concerns regarding medicines are outlined above.  No orders of the defined types were placed in this encounter.  No orders of the defined types were placed in this encounter.   Patient Instructions  Medication Instructions:  Your physician recommends that you continue on your current medications as directed. Please refer to the Current Medication list given to you today.  If you need a refill on your cardiac medications before your next appointment, please call your pharmacy.   Lab work: None Ordered   Testing/Procedures: None Ordered   Follow-Up: At [facility], you and your health needs are our priority.  As part of our continuing mission to provide you with exceptional heart care, we have created designated Provider Care Teams.  These Care Teams include your primary Cardiologist (physician) and Advanced Practice Providers (APPs -  Physician Assistants and Nurse Practitioners) who all work together to provide you with the care you need, when you need it. You will need a follow up appointment in:  1 year.  Please call our office 2 months in advance to schedule this appointment.  You may see Robert Moores, MD or one of the following Advanced Practice Providers on your designated Care  Team: Richardson Dopp, PA-C [place], [place] . Daune Perch, NP       Signed, Robert Moores, MD  09/28/2018 8:53 AM    [place]

## 2018-10-16 ENCOUNTER — Other Ambulatory Visit: Payer: Self-pay | Admitting: Internal Medicine

## 2018-10-18 ENCOUNTER — Encounter: Payer: Self-pay | Admitting: Internal Medicine

## 2018-10-19 ENCOUNTER — Other Ambulatory Visit: Payer: Self-pay | Admitting: Internal Medicine

## 2018-10-19 DIAGNOSIS — F900 Attention-deficit hyperactivity disorder, predominantly inattentive type: Secondary | ICD-10-CM

## 2018-10-19 MED ORDER — METHYLPHENIDATE HCL ER (LA) 30 MG PO CP24: 30.0000 mg | ORAL_CAPSULE | ORAL | 0 refills | Status: DC

## 2018-11-01 LAB — HM DIABETES EYE EXAM

## 2018-11-09 ENCOUNTER — Encounter: Payer: Self-pay | Admitting: Internal Medicine

## 2018-11-09 NOTE — Progress Notes (Signed)
Outside notes received. Information abstracted. Notes sent to scan.  

## 2019-01-03 ENCOUNTER — Encounter: Payer: Self-pay | Admitting: Internal Medicine

## 2019-01-03 DIAGNOSIS — I10 Essential (primary) hypertension: Secondary | ICD-10-CM

## 2019-01-03 MED ORDER — DILTIAZEM HCL ER COATED BEADS 180 MG PO TB24: 180.0000 mg | ORAL_TABLET | Freq: Every day | ORAL | 1 refills | Status: DC

## 2019-01-07 ENCOUNTER — Other Ambulatory Visit: Payer: Self-pay | Admitting: Internal Medicine

## 2019-01-26 ENCOUNTER — Encounter: Payer: Self-pay | Admitting: Internal Medicine

## 2019-01-26 ENCOUNTER — Other Ambulatory Visit: Payer: Self-pay | Admitting: Internal Medicine

## 2019-01-26 DIAGNOSIS — F900 Attention-deficit hyperactivity disorder, predominantly inattentive type: Secondary | ICD-10-CM

## 2019-01-26 MED ORDER — METHYLPHENIDATE HCL ER (LA) 30 MG PO CP24: 30.0000 mg | ORAL_CAPSULE | ORAL | 0 refills | Status: DC

## 2019-01-26 NOTE — Telephone Encounter (Signed)
Check [place] registry last filled 10/25/2018../lmb   

## 2019-01-28 ENCOUNTER — Encounter: Payer: Self-pay | Admitting: Internal Medicine

## 2019-01-28 DIAGNOSIS — I10 Essential (primary) hypertension: Secondary | ICD-10-CM

## 2019-01-30 MED ORDER — HYDROCHLOROTHIAZIDE 25 MG PO TABS: 25.0000 mg | ORAL_TABLET | Freq: Every day | ORAL | 1 refills | Status: DC

## 2019-01-30 MED ORDER — OLMESARTAN MEDOXOMIL 40 MG PO TABS: 40.0000 mg | ORAL_TABLET | Freq: Every day | ORAL | 1 refills | Status: DC

## 2019-02-07 ENCOUNTER — Encounter: Payer: Self-pay | Admitting: Internal Medicine

## 2019-02-07 ENCOUNTER — Ambulatory Visit: Payer: No Typology Code available for payment source | Admitting: Internal Medicine

## 2019-02-07 ENCOUNTER — Other Ambulatory Visit (INDEPENDENT_AMBULATORY_CARE_PROVIDER_SITE_OTHER): Payer: No Typology Code available for payment source

## 2019-02-07 VITALS — BP 138/88 | HR 91 | Temp 97.8°F | Ht 64.0 in | Wt 179.2 lb

## 2019-02-07 DIAGNOSIS — D539 Nutritional anemia, unspecified: Secondary | ICD-10-CM | POA: Insufficient documentation

## 2019-02-07 DIAGNOSIS — I10 Essential (primary) hypertension: Secondary | ICD-10-CM

## 2019-02-07 LAB — FOLATE: Folate: >24 ng/mL (ref >5.9)

## 2019-02-07 LAB — CBC WITH DIFFERENTIAL/PLATELET
BASOS PCT: 0.5 % (ref 0.0–3.0)
Basophils Absolute: 0 10*3/uL (ref 0.0–0.1)
EOS ABS: 0.1 10*3/uL (ref 0.0–0.7)
Eosinophils Relative: 2 % (ref 0.0–5.0)
HCT: 41.2 % (ref 39.0–52.0)
Hemoglobin: 14.4 g/dL (ref 13.0–17.0)
Lymphocytes Relative: 39.7 % (ref 12.0–46.0)
Lymphs Abs: 2.2 10*3/uL (ref 0.7–4.0)
MCHC: 35 g/dL (ref 30.0–36.0)
MCV: 89.1 fl (ref 78.0–100.0)
MONO ABS: 0.8 10*3/uL (ref 0.1–1.0)
Monocytes Relative: 13.4 % — ABNORMAL HIGH (ref 3.0–12.0)
Neutro Abs: 2.5 10*3/uL (ref 1.4–7.7)
Neutrophils Relative %: 44.4 % (ref 43.0–77.0)
Platelets: 263 10*3/uL (ref 150.0–400.0)
RBC: 4.63 Mil/uL (ref 4.22–5.81)
RDW: 12.9 % (ref 11.5–15.5)
WBC: 5.7 10*3/uL (ref 4.0–10.5)

## 2019-02-07 LAB — BASIC METABOLIC PANEL
BUN: 23 mg/dL (ref 6–23)
CO2: 27 mEq/L (ref 19–32)
Calcium: 9.1 mg/dL (ref 8.4–10.5)
Chloride: 100 mEq/L (ref 96–112)
Creatinine, Ser: 1.04 mg/dL (ref 0.40–1.50)
GFR: 71.04 mL/min (ref >60.00)
Glucose, Bld: 114 mg/dL — ABNORMAL HIGH (ref 70–99)
Potassium: 3.9 mEq/L (ref 3.5–5.1)
Sodium: 137 mEq/L (ref 135–145)

## 2019-02-07 LAB — FERRITIN: Ferritin: 71.5 ng/mL (ref 22.0–322.0)

## 2019-02-07 LAB — IBC PANEL
Iron: 85 ug/dL (ref 42–165)
Saturation Ratios: 22.4 % (ref 20.0–50.0)
Transferrin: 271 mg/dL (ref 212.0–360.0)

## 2019-02-07 NOTE — Progress Notes (Signed)
Subjective:  Patient ID: Robert Mccall, male    DOB: December 12, 1950  Age: 68 y.o. MRN: [id number]  CC: Hypertension and Anemia   HPI JAVONE YBANEZ presents for f/up - He complains of weight gain.  His last set of labs showed that he was mildly anemic.  He suffers from chronic neck and back pain and tells me he is seeing a neurosurgeon soon.  He tells me his blood pressure has been well controlled and he denies any recent episodes of CP or DOE.  Outpatient Medications Prior to Visit  Medication Sig Dispense Refill  . Acetaminophen 500 MG coapsule Take 500 mg by mouth Nightly.    Marland Kitchen alfuzosin (UROXATRAL) 10 MG 24 hr tablet Take 1 tablet (10 mg total) by mouth daily with breakfast. 90 tablet 1  . aspirin 81 MG tablet Take 1 tablet (81 mg total) by mouth daily. 90 tablet 1  . Cholecalciferol (VITAMIN D) 2000 UNITS CAPS Take 1 capsule (2,000 Units total) by mouth 1 dose over 46 hours. 30 capsule 11  . diltiazem (CARDIZEM LA) 180 MG 24 hr tablet Take 1 tablet (180 mg total) by mouth daily. 90 tablet 1  . DULoxetine (CYMBALTA) 30 MG capsule TAKE ONE CAPSULE BY MOUTH EVERY DAY 90 capsule 1  . esomeprazole (NEXIUM) 40 MG capsule Take 1 capsule (40 mg total) by mouth daily. 90 capsule 0  . finasteride (PROSCAR) 5 MG tablet TAKE 1 TABLET BY MOUTH DAILY 90 tablet 0  . hydrochlorothiazide (HYDRODIURIL) 25 MG tablet Take 1 tablet (25 mg total) by mouth daily. 90 tablet 1  . lamoTRIgine (LAMICTAL) 25 MG tablet TAKE 3 TABLETS BY MOUTH TWICE DAILY 540 tablet 1  . methylphenidate (RITALIN LA) 30 MG 24 hr capsule Take 1 capsule (30 mg total) by mouth every morning. 90 capsule 0  . olmesartan (BENICAR) 40 MG tablet Take 1 tablet (40 mg total) by mouth daily. 90 tablet 1  . Testosterone 20.25 MG/ACT (1.62%) GEL APPLY 2 ACTUATIONS ONTO THE SKIN DAILY 75 g 1  . timolol (TIMOPTIC) 0.5 % ophthalmic solution INT 1 GTT IN EACH EYE QAM  0  . vitamin B-12 (CYANOCOBALAMIN) 1000 MCG tablet Take 1,000 mcg by mouth daily.      . rosuvastatin (CRESTOR) 10 MG tablet Take 1 tablet (10 mg total) by mouth daily. 90 tablet 3  . bimatoprost (LUMIGAN) 0.03 % ophthalmic solution Place 1 drop into both eyes daily.      No facility-administered medications prior to visit.     ROS Review of Systems  Constitutional: Positive for unexpected weight change (wt gain). Negative for appetite change, diaphoresis and fatigue.  HENT: Negative for trouble swallowing.   Eyes: Negative for visual disturbance.  Respiratory: Negative for cough, chest tightness, shortness of breath and wheezing.   Gastrointestinal: Negative for abdominal pain, blood in stool, diarrhea and nausea.  Genitourinary: Negative.  Negative for difficulty urinating and dysuria.  Musculoskeletal: Negative.  Negative for arthralgias and myalgias.  Skin: Negative.  Negative for color change and pallor.  Neurological: Negative for dizziness, weakness, light-headedness and numbness.  Hematological: Negative for adenopathy. Does not bruise/bleed easily.  Psychiatric/Behavioral: Negative.     Objective:  BP 138/88 (BP Location: Left Arm, Patient Position: Sitting, Cuff Size: Normal)   Pulse 91   Temp 97.8 F (36.6 C) (Oral)   Ht 5\' 4"  (1.626 m)   Wt 179 lb 4 oz (81.3 kg)   SpO2 96%   BMI 30.77 kg/m   BP  Readings from Last 3 Encounters:  02/07/19 138/88  09/28/18 110/70  09/01/18 125/79    Wt Readings from Last 3 Encounters:  02/07/19 179 lb 4 oz (81.3 kg)  09/28/18 176 lb 1.9 oz (79.9 kg)  09/01/18 175 lb 9.6 oz (79.7 kg)    Physical Exam Vitals signs reviewed.  Constitutional:      Appearance: He is not ill-appearing or diaphoretic.  HENT:     Nose: Nose normal. No congestion.     Mouth/Throat:     Mouth: Mucous membranes are moist.     Pharynx: Oropharynx is clear. No oropharyngeal exudate or posterior oropharyngeal erythema.  Eyes:     General: No scleral icterus.    Conjunctiva/sclera: Conjunctivae normal.  Neck:     Musculoskeletal:  Normal range of motion and neck supple.  Cardiovascular:     Rate and Rhythm: Normal rate and regular rhythm.     Heart sounds: No murmur.  Pulmonary:     Effort: Pulmonary effort is normal.     Breath sounds: Normal breath sounds. No stridor. No wheezing, rhonchi or rales.  Abdominal:     General: Bowel sounds are normal.     Palpations: There is no mass.     Tenderness: There is no abdominal tenderness. There is no guarding.  Musculoskeletal: Normal range of motion.        General: No swelling.     Right lower leg: No edema.     Left lower leg: No edema.  Lymphadenopathy:     Cervical: No cervical adenopathy.  Skin:    General: Skin is warm and dry.     Coloration: Skin is not pale.  Neurological:     General: No focal deficit present.     Mental Status: He is oriented to person, place, and time. Mental status is at baseline.     Lab Results  Component Value Date   WBC 5.7 02/07/2019   HGB 14.4 02/07/2019   HCT 41.2 02/07/2019   PLT 263.0 02/07/2019   GLUCOSE 114 (H) 02/07/2019   CHOL 114 09/23/2018   TRIG 75 09/23/2018   HDL 38 (L) 09/23/2018   LDLCALC 61 09/23/2018   ALT 32 09/23/2018   AST 20 09/23/2018   NA 137 02/07/2019   K 3.9 02/07/2019   CL 100 02/07/2019   CREATININE 1.04 02/07/2019   BUN 23 02/07/2019   CO2 27 02/07/2019   TSH 1.72 08/09/2018   PSA 0.21 08/09/2018   INR 1.04 01/23/2017   HGBA1C 5.9 08/09/2018    No results found.  Assessment & Plan:   Dion was seen today for hypertension and anemia.  Diagnoses and all orders for this visit:  Essential hypertension- His blood pressure is well controlled.  Electrolytes and renal function are normal.  Will continue the current combination of olmesartan, diltiazem, and hydrochlorothiazide. -     Basic metabolic panel; Future  Deficiency anemia- His H&H are normal.  I will screen him for vitamin deficiencies. -     CBC with Differential/Platelet; Future -     IBC panel; Future -     Folate;  Future -     Ferritin; Future -     Vitamin B1; Future   I have discontinued Hardin Negus. Tabak's bimatoprost. I am also having him maintain his Vitamin D, vitamin B-12, Acetaminophen, rosuvastatin, esomeprazole, Testosterone, finasteride, aspirin, timolol, alfuzosin, lamoTRIgine, DULoxetine, diltiazem, methylphenidate, olmesartan, and hydrochlorothiazide.  No orders of the defined types were placed in this encounter.  Follow-up: No follow-ups on file.  Scarlette Calico, MD

## 2019-02-08 ENCOUNTER — Encounter: Payer: Self-pay | Admitting: Internal Medicine

## 2019-02-08 NOTE — Patient Instructions (Signed)

## 2019-02-11 LAB — VITAMIN B1: Vitamin B1 (Thiamine): 10 nmol/L (ref 8–30)

## 2019-02-12 ENCOUNTER — Encounter: Payer: Self-pay | Admitting: Internal Medicine

## 2019-02-28 ENCOUNTER — Encounter: Payer: Self-pay | Admitting: Internal Medicine

## 2019-02-28 ENCOUNTER — Other Ambulatory Visit: Payer: Self-pay | Admitting: Internal Medicine

## 2019-02-28 DIAGNOSIS — N401 Enlarged prostate with lower urinary tract symptoms: Secondary | ICD-10-CM

## 2019-02-28 DIAGNOSIS — R351 Nocturia: Principal | ICD-10-CM

## 2019-02-28 MED ORDER — FINASTERIDE 5 MG PO TABS: 5.0000 mg | ORAL_TABLET | Freq: Every day | ORAL | 1 refills | Status: DC

## 2019-03-01 ENCOUNTER — Other Ambulatory Visit: Payer: Self-pay | Admitting: Internal Medicine

## 2019-03-01 ENCOUNTER — Other Ambulatory Visit: Payer: Self-pay | Admitting: Cardiovascular Disease

## 2019-03-12 ENCOUNTER — Encounter: Payer: Self-pay | Admitting: Internal Medicine

## 2019-03-13 ENCOUNTER — Other Ambulatory Visit: Payer: Self-pay | Admitting: Internal Medicine

## 2019-03-13 DIAGNOSIS — M5441 Lumbago with sciatica, right side: Secondary | ICD-10-CM

## 2019-03-13 DIAGNOSIS — M5442 Lumbago with sciatica, left side: Secondary | ICD-10-CM

## 2019-03-13 DIAGNOSIS — G8929 Other chronic pain: Secondary | ICD-10-CM | POA: Insufficient documentation

## 2019-03-13 DIAGNOSIS — M50122 Cervical disc disorder at C5-C6 level with radiculopathy: Secondary | ICD-10-CM

## 2019-03-13 MED ORDER — MELOXICAM 7.5 MG PO TABS: 7.5000 mg | ORAL_TABLET | Freq: Every day | ORAL | 1 refills | Status: DC

## 2019-04-12 ENCOUNTER — Encounter: Payer: Self-pay | Admitting: Internal Medicine

## 2019-04-14 ENCOUNTER — Other Ambulatory Visit: Payer: Self-pay | Admitting: Internal Medicine

## 2019-05-08 ENCOUNTER — Encounter: Payer: Self-pay | Admitting: Internal Medicine

## 2019-05-11 LAB — HM DIABETES EYE EXAM

## 2019-05-15 ENCOUNTER — Other Ambulatory Visit: Payer: Self-pay

## 2019-05-15 ENCOUNTER — Encounter: Payer: Self-pay | Admitting: Internal Medicine

## 2019-05-15 ENCOUNTER — Ambulatory Visit (INDEPENDENT_AMBULATORY_CARE_PROVIDER_SITE_OTHER): Payer: No Typology Code available for payment source | Admitting: Internal Medicine

## 2019-05-15 ENCOUNTER — Other Ambulatory Visit (INDEPENDENT_AMBULATORY_CARE_PROVIDER_SITE_OTHER): Payer: No Typology Code available for payment source

## 2019-05-15 VITALS — BP 138/82 | HR 98 | Temp 97.8°F | Resp 16 | Ht 64.0 in | Wt 186.0 lb

## 2019-05-15 DIAGNOSIS — I5032 Chronic diastolic (congestive) heart failure: Secondary | ICD-10-CM

## 2019-05-15 DIAGNOSIS — E538 Deficiency of other specified B group vitamins: Secondary | ICD-10-CM

## 2019-05-15 DIAGNOSIS — E118 Type 2 diabetes mellitus with unspecified complications: Secondary | ICD-10-CM

## 2019-05-15 DIAGNOSIS — R7303 Prediabetes: Secondary | ICD-10-CM

## 2019-05-15 DIAGNOSIS — E291 Testicular hypofunction: Secondary | ICD-10-CM | POA: Diagnosis not present

## 2019-05-15 DIAGNOSIS — I10 Essential (primary) hypertension: Secondary | ICD-10-CM

## 2019-05-15 LAB — BASIC METABOLIC PANEL
BUN: 18 mg/dL (ref 6–23)
CO2: 27 mEq/L (ref 19–32)
Calcium: 9.6 mg/dL (ref 8.4–10.5)
Chloride: 101 mEq/L (ref 96–112)
Creatinine, Ser: 1.1 mg/dL (ref 0.40–1.50)
GFR: 66.54 mL/min (ref >60.00)
Glucose, Bld: 112 mg/dL — ABNORMAL HIGH (ref 70–99)
Potassium: 4.2 mEq/L (ref 3.5–5.1)
Sodium: 138 mEq/L (ref 135–145)

## 2019-05-15 LAB — CBC WITH DIFFERENTIAL/PLATELET
Basophils Absolute: 0.1 10*3/uL (ref 0.0–0.1)
Basophils Relative: 1.1 % (ref 0.0–3.0)
Eosinophils Absolute: 0.1 10*3/uL (ref 0.0–0.7)
Eosinophils Relative: 1.6 % (ref 0.0–5.0)
HCT: 40.5 % (ref 39.0–52.0)
Hemoglobin: 14.1 g/dL (ref 13.0–17.0)
Lymphocytes Relative: 32.2 % (ref 12.0–46.0)
Lymphs Abs: 2.8 10*3/uL (ref 0.7–4.0)
MCHC: 34.8 g/dL (ref 30.0–36.0)
MCV: 87.1 fl (ref 78.0–100.0)
Monocytes Absolute: 0.8 10*3/uL (ref 0.1–1.0)
Monocytes Relative: 9.2 % (ref 3.0–12.0)
Neutro Abs: 4.9 10*3/uL (ref 1.4–7.7)
Neutrophils Relative %: 55.9 % (ref 43.0–77.0)
Platelets: 262 10*3/uL (ref 150.0–400.0)
RBC: 4.65 Mil/uL (ref 4.22–5.81)
RDW: 13.6 % (ref 11.5–15.5)
WBC: 8.7 10*3/uL (ref 4.0–10.5)

## 2019-05-15 LAB — HEMOGLOBIN A1C: Hgb A1c MFr Bld: 6.7 % — ABNORMAL HIGH (ref 4.6–6.5)

## 2019-05-15 LAB — FOLATE: Folate: >23.9 ng/mL (ref >5.9)

## 2019-05-15 LAB — VITAMIN B12: Vitamin B-12: 783 pg/mL (ref 211–911)

## 2019-05-15 NOTE — Patient Instructions (Signed)
Vitamin B12 Deficiency Vitamin B12 deficiency occurs when the body does not have enough vitamin B12. Vitamin B12 is an important vitamin. The body needs vitamin B12:  To make red blood cells.  To make DNA. This is the genetic material inside cells.  To help the nerves work properly so they can carry messages from the brain to the body. Vitamin B12 deficiency can cause various health problems, such as a low red blood cell count (anemia) or nerve damage. What are the causes? This condition may be caused by:  Not eating enough foods that contain vitamin B12.  Not having enough stomach acid and digestive fluids to properly absorb vitamin B12 from the food that you eat.  Certain digestive system diseases that make it hard to absorb vitamin B12. These diseases include Crohn disease, chronic pancreatitis, and cystic fibrosis.  Pernicious anemia. This is a condition in which the body does not make enough of a protein (intrinsic factor), resulting in too few red blood cells.  Having a surgery in which part of the stomach or small intestine is removed.  Taking certain medicines that make it hard for the body to absorb vitamin B12. These medicines include: ? Heartburn medicine (antacids and proton pump inhibitors). ? An antibiotic medicine called neomycin. ? Some medicines that are used to treat diabetes, tuberculosis, gout, or high cholesterol. What increases the risk? The following factors may make you more likely to develop a B12 deficiency:  Being older than age 69.  Eating a vegetarian or vegan diet, especially while you are pregnant.  Eating a poor diet while you are pregnant.  Taking certain drugs.  Having alcoholism. What are the signs or symptoms? In some cases, there are no symptoms of this condition. If the condition leads to anemia or nerve damage, various symptoms can occur, such as:  Weakness.  Fatigue.  Loss of appetite.  Weight loss.  Numbness or tingling in your  hands and feet.  Redness and burning of the tongue.  Confusion or memory problems.  Depression.  Sensory problems, such as color blindness, ringing in the ears, or loss of taste.  Diarrhea or constipation.  Trouble walking. If anemia is severe, symptoms can include:  Shortness of breath.  Dizziness.  Rapid heart rate (tachycardia).  How is this diagnosed? This condition may be diagnosed with a blood test to measure the level of vitamin B12 in your blood. You may have other tests to help find the cause of your vitamin B12 deficiency. These tests may include:  A complete blood count (CBC). This is a group of tests that measure certain characteristics of blood cells.  A blood test to measure intrinsic factor.  An endoscopy. In this procedure, a thin tube with a camera on the end is used to look into your stomach or intestines. How is this treated? Treatment for this condition depends on the cause. Common treatment options include:  Changing your eating and drinking habits, such as: ? Eating more foods that contain vitamin B12. ? Drinking less alcohol or no alcohol.  Taking vitamin B12 supplements. Your health care provider will tell you which dosage is best for you.  Getting vitamin B12 injections. Follow these instructions at home:  Take supplements only as told by your health care provider. Follow the directions carefully.  Get any injections that are prescribed by your health care provider.  Do not miss your appointments.  Eat lots of healthy foods that contain vitamin B12. Ask your health care provider if  Follow these instructions at home:   Take supplements only as told by your health care provider. Follow the directions carefully.   Get any injections that are prescribed by your health care provider.  Do not miss your appointments.   Eat lots of healthy foods that contain vitamin B12. Ask your health care provider if you should work with a dietitian. Foods that contain vitamin B12 include:  ? Meat.  ? Meat from birds (poultry).  ? Fish.  ? Eggs.  ? Cereal and dairy products that are fortified. This means that vitamin B12 has been added to the food. Check the label on the package to see if the food is fortified.   Do not abuse alcohol.   Keep all follow-up visits as told by your  health care provider. This is important.  Contact a health care provider if:   Your symptoms come back.  Get help right away if:   You develop shortness of breath.   You have chest pain.   You become dizzy or you lose consciousness.  This information is not intended to replace advice given to you by your health care provider. Make sure you discuss any questions you have with your health care provider.  Document Released: 02/15/2012 Document Revised: 05/06/2016 Document Reviewed: 04/10/2015  [employer]vier Interactive Patient Education  2019 [employer]

## 2019-05-15 NOTE — Progress Notes (Signed)
Subjective:  Patient ID: Macie Burows, male    DOB: 27-Dec-1950  Age: 68 y.o. MRN: [id number]  CC: Hypertension and Diabetes   HPI VERDIS KOVAL presents for f/up - He has a prior history of peripheral neuropathy and  MGUS and complains that over the last few months the numbness in his lower extremities and feet has worsened.  He also complains of weight gain.  Outpatient Medications Prior to Visit  Medication Sig Dispense Refill  . Acetaminophen 500 MG coapsule Take 500 mg by mouth Nightly.    Marland Kitchen alfuzosin (UROXATRAL) 10 MG 24 hr tablet TAKE 1 TABLET(10 MG) BY MOUTH DAILY WITH BREAKFAST 90 tablet 1  . aspirin 81 MG tablet Take 1 tablet (81 mg total) by mouth daily. 90 tablet 1  . Cholecalciferol (VITAMIN D) 2000 UNITS CAPS Take 1 capsule (2,000 Units total) by mouth 1 dose over 46 hours. 30 capsule 11  . diltiazem (CARDIZEM LA) 180 MG 24 hr tablet Take 1 tablet (180 mg total) by mouth daily. 90 tablet 1  . esomeprazole (NEXIUM) 40 MG capsule Take 1 capsule (40 mg total) by mouth daily. 90 capsule 0  . finasteride (PROSCAR) 5 MG tablet Take 1 tablet (5 mg total) by mouth daily. 90 tablet 1  . hydrochlorothiazide (HYDRODIURIL) 25 MG tablet Take 1 tablet (25 mg total) by mouth daily. 90 tablet 1  . latanoprost (XALATAN) 0.005 % ophthalmic solution Place 1 drop into both eyes at bedtime.    . meloxicam (MOBIC) 7.5 MG tablet Take 1 tablet (7.5 mg total) by mouth daily. 90 tablet 1  . olmesartan (BENICAR) 40 MG tablet Take 1 tablet (40 mg total) by mouth daily. 90 tablet 1  . rosuvastatin (CRESTOR) 10 MG tablet TAKE 1 TABLET(10 MG) BY MOUTH DAILY 90 tablet 1  . Testosterone 20.25 MG/ACT (1.62%) GEL APPLY 2 ACTUATIONS ONTO THE SKIN DAILY 75 g 1  . timolol (TIMOPTIC) 0.5 % ophthalmic solution INT 1 GTT IN EACH EYE QAM  0  . vitamin B-12 (CYANOCOBALAMIN) 1000 MCG tablet Take 1,000 mcg by mouth daily.    . DULoxetine (CYMBALTA) 30 MG capsule TAKE ONE CAPSULE BY MOUTH EVERY DAY (Patient not taking:  Reported on 05/15/2019) 90 capsule 1  . lamoTRIgine (LAMICTAL) 25 MG tablet TAKE 3 TABLETS BY MOUTH TWICE DAILY (Patient not taking: Reported on 05/15/2019) 540 tablet 1  . methylphenidate (RITALIN LA) 30 MG 24 hr capsule Take 1 capsule (30 mg total) by mouth every morning. (Patient not taking: Reported on 05/15/2019) 90 capsule 0   No facility-administered medications prior to visit.     ROS Review of Systems  Constitutional: Positive for unexpected weight change. Negative for diaphoresis and fatigue.  HENT: Negative.   Eyes: Negative for visual disturbance.  Respiratory: Negative for cough, chest tightness, shortness of breath and wheezing.   Cardiovascular: Negative for chest pain, palpitations and leg swelling.  Gastrointestinal: Negative for abdominal pain, constipation, diarrhea, nausea and vomiting.  Endocrine: Negative.   Genitourinary: Negative.  Negative for difficulty urinating and dysuria.  Musculoskeletal: Negative.  Negative for arthralgias and myalgias.  Skin: Negative.  Negative for color change and pallor.  Neurological: Positive for numbness. Negative for dizziness and weakness.  Hematological: Negative for adenopathy. Does not bruise/bleed easily.  Psychiatric/Behavioral: Negative.     Objective:  BP 138/82 (BP Location: Left Arm, Patient Position: Sitting, Cuff Size: Normal)   Pulse 98   Temp 97.8 F (36.6 C) (Oral)   Ht 5\' 4"  (1.626  m)   Wt 186 lb (84.4 kg)   SpO2 98%   BMI 31.93 kg/m   BP Readings from Last 3 Encounters:  05/15/19 138/82  02/07/19 138/88  09/28/18 110/70    Wt Readings from Last 3 Encounters:  05/15/19 186 lb (84.4 kg)  02/07/19 179 lb 4 oz (81.3 kg)  09/28/18 176 lb 1.9 oz (79.9 kg)    Physical Exam Vitals signs reviewed.  Constitutional:      Appearance: He is obese. He is not ill-appearing.  HENT:     Nose: Nose normal.     Mouth/Throat:     Mouth: Mucous membranes are moist.  Eyes:     General: No scleral icterus.     Conjunctiva/sclera: Conjunctivae normal.  Neck:     Musculoskeletal: Normal range of motion. No neck rigidity.  Cardiovascular:     Rate and Rhythm: Normal rate and regular rhythm.     Heart sounds: No murmur. No gallop.   Pulmonary:     Effort: Pulmonary effort is normal.     Breath sounds: No stridor. No wheezing, rhonchi or rales.  Abdominal:     General: Abdomen is flat and protuberant. Bowel sounds are normal.     Palpations: There is no hepatomegaly or splenomegaly.     Tenderness: There is no abdominal tenderness.  Musculoskeletal: Normal range of motion.     Right lower leg: No edema.     Left lower leg: No edema.  Lymphadenopathy:     Cervical: No cervical adenopathy.  Skin:    General: Skin is warm and dry.  Neurological:     General: No focal deficit present.     Mental Status: Mental status is at baseline.     Cranial Nerves: Cranial nerves are intact.     Sensory: Sensation is intact. No sensory deficit.     Motor: Motor function is intact. No weakness or tremor.     Coordination: Coordination is intact. Coordination normal. Heel to [facility] Test normal.     Deep Tendon Reflexes:     Reflex Scores:      Tricep reflexes are 1+ on the right side and 1+ on the left side.      Bicep reflexes are 1+ on the right side and 1+ on the left side.      Brachioradialis reflexes are 1+ on the right side and 1+ on the left side.      Patellar reflexes are 1+ on the right side and 1+ on the left side.      Achilles reflexes are 1+ on the right side and 1+ on the left side. Psychiatric:        Mood and Affect: Mood normal.        Behavior: Behavior normal.     Lab Results  Component Value Date   WBC 8.7 05/15/2019   HGB 14.1 05/15/2019   HCT 40.5 05/15/2019   PLT 262.0 05/15/2019   GLUCOSE 112 (H) 05/15/2019   CHOL 114 09/23/2018   TRIG 75 09/23/2018   HDL 38 (L) 09/23/2018   LDLCALC 61 09/23/2018   ALT 32 09/23/2018   AST 20 09/23/2018   NA 138 05/15/2019   K 4.2  05/15/2019   CL 101 05/15/2019   CREATININE 1.10 05/15/2019   BUN 18 05/15/2019   CO2 27 05/15/2019   TSH 1.72 08/09/2018   PSA 0.21 08/09/2018   INR 1.04 01/23/2017   HGBA1C 6.7 (H) 05/15/2019    No results found.  Assessment & Plan:   Kamden was seen today for hypertension and diabetes.  Diagnoses and all orders for this visit:  Essential hypertension- His blood pressure is adequately well controlled.  Electrolytes and renal function are normal. -     CBC with Differential/Platelet; Future -     Basic metabolic panel; Future  CHF (congestive heart failure), NYHA class I, chronic, diastolic ([place])- He has a normal volume status.  Will maintain control of his blood pressure and I have asked him to work on his lifestyle modifications.  Hypogonadism male  B12 deficiency- His B12 level and folate levels are in the normal range. -     CBC with Differential/Platelet; Future -     Vitamin B12; Future -     Folate; Future  Prediabetes- His A1c is up to 6.7%. -     Basic metabolic panel; Future -     Hemoglobin A1c; Future  Type II diabetes mellitus with complication ([place])- He has new onset diabetes mellitus type 2.  I think this explains his worsening neuropathy.  Medical therapy is not indicated.  I have encouraged him to improve his lifestyle modifications. -     HM Diabetes Foot Exam   I am having Hardin Negus. [place]utz maintain his Vitamin D, vitamin B-12, Acetaminophen, esomeprazole, Testosterone, aspirin, timolol, lamoTRIgine, diltiazem, methylphenidate, olmesartan, hydrochlorothiazide, finasteride, alfuzosin, rosuvastatin, meloxicam, DULoxetine, and latanoprost.  No orders of the defined types were placed in this encounter.    Follow-up: Return in about 3 months (around 08/15/2019).  Scarlette Calico, MD

## 2019-05-23 ENCOUNTER — Other Ambulatory Visit: Payer: Self-pay | Admitting: Internal Medicine

## 2019-05-23 DIAGNOSIS — I10 Essential (primary) hypertension: Secondary | ICD-10-CM

## 2019-05-23 DIAGNOSIS — I5032 Chronic diastolic (congestive) heart failure: Secondary | ICD-10-CM

## 2019-05-23 MED ORDER — DILTIAZEM HCL ER COATED BEADS 180 MG PO TB24: 180.0000 mg | ORAL_TABLET | Freq: Every day | ORAL | 1 refills | Status: DC

## 2019-05-25 ENCOUNTER — Other Ambulatory Visit: Payer: Self-pay | Admitting: Internal Medicine

## 2019-05-31 ENCOUNTER — Encounter: Payer: Self-pay | Admitting: Internal Medicine

## 2019-05-31 ENCOUNTER — Other Ambulatory Visit: Payer: Self-pay | Admitting: Internal Medicine

## 2019-05-31 DIAGNOSIS — F900 Attention-deficit hyperactivity disorder, predominantly inattentive type: Secondary | ICD-10-CM

## 2019-05-31 MED ORDER — METHYLPHENIDATE HCL ER (LA) 30 MG PO CP24: 30.0000 mg | ORAL_CAPSULE | ORAL | 0 refills | Status: DC

## 2019-07-12 ENCOUNTER — Other Ambulatory Visit: Payer: Self-pay | Admitting: Internal Medicine

## 2019-07-12 DIAGNOSIS — I10 Essential (primary) hypertension: Secondary | ICD-10-CM

## 2019-07-12 DIAGNOSIS — N401 Enlarged prostate with lower urinary tract symptoms: Secondary | ICD-10-CM

## 2019-07-12 MED ORDER — ALFUZOSIN HCL ER 10 MG PO TB24: ORAL_TABLET | ORAL | 1 refills | Status: DC

## 2019-07-12 MED ORDER — HYDROCHLOROTHIAZIDE 25 MG PO TABS: 25.0000 mg | ORAL_TABLET | Freq: Every day | ORAL | 1 refills | Status: DC

## 2019-07-12 MED ORDER — FINASTERIDE 5 MG PO TABS: 5.0000 mg | ORAL_TABLET | Freq: Every day | ORAL | 1 refills | Status: DC

## 2019-07-24 ENCOUNTER — Other Ambulatory Visit: Payer: Self-pay | Admitting: Internal Medicine

## 2019-07-24 DIAGNOSIS — I10 Essential (primary) hypertension: Secondary | ICD-10-CM

## 2019-07-24 MED ORDER — OLMESARTAN MEDOXOMIL 40 MG PO TABS: 40.0000 mg | ORAL_TABLET | Freq: Every day | ORAL | 1 refills | Status: DC

## 2019-08-10 ENCOUNTER — Ambulatory Visit: Payer: No Typology Code available for payment source | Admitting: Internal Medicine

## 2019-08-12 ENCOUNTER — Other Ambulatory Visit: Payer: Self-pay

## 2019-08-12 ENCOUNTER — Ambulatory Visit: Payer: No Typology Code available for payment source

## 2019-09-07 ENCOUNTER — Other Ambulatory Visit: Payer: Self-pay

## 2019-09-07 ENCOUNTER — Other Ambulatory Visit: Payer: Self-pay | Admitting: Hematology and Oncology

## 2019-09-07 ENCOUNTER — Inpatient Hospital Stay: Payer: PPO | Attending: Hematology and Oncology

## 2019-09-07 DIAGNOSIS — Z7982 Long term (current) use of aspirin: Secondary | ICD-10-CM | POA: Insufficient documentation

## 2019-09-07 DIAGNOSIS — N6341 Unspecified lump in right breast, subareolar: Secondary | ICD-10-CM | POA: Insufficient documentation

## 2019-09-07 DIAGNOSIS — E1165 Type 2 diabetes mellitus with hyperglycemia: Secondary | ICD-10-CM | POA: Diagnosis not present

## 2019-09-07 DIAGNOSIS — D472 Monoclonal gammopathy: Secondary | ICD-10-CM | POA: Diagnosis not present

## 2019-09-07 DIAGNOSIS — R7989 Other specified abnormal findings of blood chemistry: Secondary | ICD-10-CM | POA: Diagnosis not present

## 2019-09-07 DIAGNOSIS — Z79899 Other long term (current) drug therapy: Secondary | ICD-10-CM | POA: Insufficient documentation

## 2019-09-07 DIAGNOSIS — Z23 Encounter for immunization: Secondary | ICD-10-CM | POA: Diagnosis not present

## 2019-09-07 DIAGNOSIS — R42 Dizziness and giddiness: Secondary | ICD-10-CM | POA: Insufficient documentation

## 2019-09-07 DIAGNOSIS — Z803 Family history of malignant neoplasm of breast: Secondary | ICD-10-CM | POA: Diagnosis not present

## 2019-09-07 LAB — CBC WITH DIFFERENTIAL/PLATELET
Abs Immature Granulocytes: 0.01 10*3/uL (ref 0.00–0.07)
Basophils Absolute: 0.1 10*3/uL (ref 0.0–0.1)
Basophils Relative: 1 %
Eosinophils Absolute: 0.2 10*3/uL (ref 0.0–0.5)
Eosinophils Relative: 2 %
HCT: 42 % (ref 39.0–52.0)
Hemoglobin: 14.9 g/dL (ref 13.0–17.0)
Immature Granulocytes: 0 %
Lymphocytes Relative: 36 %
Lymphs Abs: 3.1 10*3/uL (ref 0.7–4.0)
MCH: 31 pg (ref 26.0–34.0)
MCHC: 35.5 g/dL (ref 30.0–36.0)
MCV: 87.5 fL (ref 80.0–100.0)
Monocytes Absolute: 0.8 10*3/uL (ref 0.1–1.0)
Monocytes Relative: 10 %
Neutro Abs: 4.4 10*3/uL (ref 1.7–7.7)
Neutrophils Relative %: 51 %
Platelets: 281 10*3/uL (ref 150–400)
RBC: 4.8 MIL/uL (ref 4.22–5.81)
RDW: 12.2 % (ref 11.5–15.5)
WBC: 8.5 10*3/uL (ref 4.0–10.5)
nRBC: 0 % (ref 0.0–0.2)

## 2019-09-07 LAB — COMPREHENSIVE METABOLIC PANEL
ALT: 40 U/L (ref 0–44)
AST: 31 U/L (ref 15–41)
Albumin: 4.8 g/dL (ref 3.5–5.0)
Alkaline Phosphatase: 51 U/L (ref 38–126)
Anion gap: 9 (ref 5–15)
BUN: 19 mg/dL (ref 8–23)
CO2: 27 mmol/L (ref 22–32)
Calcium: 9.6 mg/dL (ref 8.9–10.3)
Chloride: 103 mmol/L (ref 98–111)
Creatinine, Ser: 1.35 mg/dL — ABNORMAL HIGH (ref 0.61–1.24)
GFR calc Af Amer: >60 mL/min (ref >60)
GFR calc non Af Amer: 54 mL/min — ABNORMAL LOW (ref >60)
Glucose, Bld: 112 mg/dL — ABNORMAL HIGH (ref 70–99)
Potassium: 3.8 mmol/L (ref 3.5–5.1)
Sodium: 139 mmol/L (ref 135–145)
Total Bilirubin: 0.4 mg/dL (ref 0.3–1.2)
Total Protein: 7.9 g/dL (ref 6.5–8.1)

## 2019-09-08 LAB — KAPPA/LAMBDA LIGHT CHAINS
Kappa free light chain: 19.2 mg/L (ref 3.3–19.4)
Kappa, lambda light chain ratio: 1.05 (ref 0.26–1.65)
Lambda free light chains: 18.2 mg/L (ref 5.7–26.3)

## 2019-09-10 LAB — MULTIPLE MYELOMA PANEL, SERUM
Albumin SerPl Elph-Mcnc: 4.1 g/dL (ref 2.9–4.4)
Albumin/Glob SerPl: 1.3 (ref 0.7–1.7)
Alpha 1: 0.2 g/dL (ref 0.0–0.4)
Alpha2 Glob SerPl Elph-Mcnc: 1 g/dL (ref 0.4–1.0)
B-Globulin SerPl Elph-Mcnc: 0.9 g/dL (ref 0.7–1.3)
Gamma Glob SerPl Elph-Mcnc: 1.1 g/dL (ref 0.4–1.8)
Globulin, Total: 3.2 g/dL (ref 2.2–3.9)
IgA: 179 mg/dL (ref 61–437)
IgG (Immunoglobin G), Serum: 1163 mg/dL (ref 603–1613)
IgM (Immunoglobulin M), Srm: 133 mg/dL (ref 20–172)
M Protein SerPl Elph-Mcnc: 0.3 g/dL — ABNORMAL HIGH
Total Protein ELP: 7.3 g/dL (ref 6.0–8.5)

## 2019-09-14 ENCOUNTER — Other Ambulatory Visit: Payer: Self-pay

## 2019-09-14 ENCOUNTER — Inpatient Hospital Stay (HOSPITAL_BASED_OUTPATIENT_CLINIC_OR_DEPARTMENT_OTHER): Payer: PPO | Admitting: Hematology and Oncology

## 2019-09-14 ENCOUNTER — Other Ambulatory Visit: Payer: Self-pay | Admitting: Hematology and Oncology

## 2019-09-14 ENCOUNTER — Encounter: Payer: Self-pay | Admitting: Hematology and Oncology

## 2019-09-14 VITALS — BP 114/76 | HR 96 | Temp 98.2°F | Resp 18 | Ht 64.0 in | Wt 175.6 lb

## 2019-09-14 DIAGNOSIS — Z23 Encounter for immunization: Secondary | ICD-10-CM | POA: Diagnosis not present

## 2019-09-14 DIAGNOSIS — D472 Monoclonal gammopathy: Secondary | ICD-10-CM | POA: Diagnosis not present

## 2019-09-14 DIAGNOSIS — N6310 Unspecified lump in the right breast, unspecified quadrant: Secondary | ICD-10-CM | POA: Diagnosis not present

## 2019-09-14 DIAGNOSIS — R739 Hyperglycemia, unspecified: Secondary | ICD-10-CM

## 2019-09-14 DIAGNOSIS — N6341 Unspecified lump in right breast, subareolar: Secondary | ICD-10-CM | POA: Diagnosis not present

## 2019-09-14 DIAGNOSIS — R7989 Other specified abnormal findings of blood chemistry: Secondary | ICD-10-CM | POA: Diagnosis not present

## 2019-09-14 MED ORDER — INFLUENZA VAC A&B SA ADJ QUAD 0.5 ML IM PRSY
PREFILLED_SYRINGE | INTRAMUSCULAR | Status: AC
  Filled 2019-09-14: qty 0.5

## 2019-09-14 MED ORDER — INFLUENZA VAC A&B SA ADJ QUAD 0.5 ML IM PRSY
0.5000 mL | PREFILLED_SYRINGE | Freq: Once | INTRAMUSCULAR | Status: AC
  Administered 2019-09-14: 0.5 mL via INTRAMUSCULAR

## 2019-09-14 NOTE — Progress Notes (Signed)
[place] OFFICE PROGRESS NOTE  Patient Care Team: [name], MD as PCP - General (Internal Medicine) Nahser, Wonda Cheng, MD as PCP - Cardiology (Cardiology) Inda Castle, MD (Inactive) (Gastroenterology) Camillo Flaming, OD (Optometry) Carolan Clines, MD (Inactive) as Consulting Physician (Urology) Rozetta Nunnery, MD (Otolaryngology)  ASSESSMENT & PLAN:  Lump of right breast He has palpable breast mass on the right behind the areola complex He has no clinical signs of gynecomastia It could be related to his medication such as Proscar but given family history of breast cancer in his mother, I recommend imaging study with diagnostic mammogram for further evaluation I will also alert our breast navigator to help streamline referral to general surgery if needed He is advised to stop alcohol intake and to stop Proscar  MGUS (monoclonal gammopathy of unknown significance) The patient is noted to have IgG lambda MGUS. According to his recent blood work, and there were nothing to suggest end organ damage. So far, he has no signs of disease progression I spent a lot of time educating the patient the natural history of MGUS I will see him once a year with repeat labs and examination We discussed the importance of preventive care and reviewed the vaccination programs. He does not have any prior allergic reactions to influenza vaccination. He agrees to proceed with influenza vaccination today and we will administer it today at the clinic.   Elevated serum creatinine This could be related to his fasting state I recommend hydration  Elevated blood sugar He was fasting when he came in for his blood test last week Based on current results, the patient is diabetic I recommend close follow-up with primary care doctor for management   Orders Placed This Encounter  Procedures  . MM DIAG BREAST TOMO UNI LEFT    Standing Status:   Future    Standing Expiration  Date:   09/13/2020    Order Specific Question:   Reason for Exam (SYMPTOM  OR DIAGNOSIS REQUIRED)    Answer:   palpable breast lump on the right, family history of breast cancer in mother    Order Specific Question:   Preferred imaging location?    Answer:   [facility]    INTERVAL HISTORY: Please see below for problem oriented charting. He returns for further follow-up for IgG lambda MGUS He denies recent infection, fever or chills No new bone pain The patient have positive family history of breast cancer in his mother He palpated a lump on the right breast several weeks ago It is causing discomfort and appears to be enlarging He denies nipple discharge The patient have history of testicular removed as a child He takes Proscar He drinks alcohol on a intermittent basis  SUMMARY OF ONCOLOGIC HISTORY:  The patient was feeling unwell with sensation of dizziness. He was referred to neurologist for further evaluation. Incidentally, during his physical examination, he also noted history of reduced sensation in his feet and numbness at the end of toes.  The patient has history of chronic back pain and was treated with gabapentin for his back pain. His neurologist perform significant workup including blood work which showed IgG lambda MGUS. He is being referred here for further evaluation He denies history of abnormal bone pain or bone fracture. Patient denies history of recurrent infection or atypical infections such as shingles of meningitis. Denies chills, night sweats, anorexia or abnormal weight loss. Repeat blood work and skeletal survey in September 2017 show no evidence  of end organ damage  He was recommended observation He was also noted to have borderline B12 deficiency and was recommended oral vitamin B12 replacement therapy   REVIEW OF SYSTEMS:   Constitutional: Denies fevers, chills or abnormal weight loss Eyes: Denies blurriness of vision Ears, nose, mouth, throat, and  face: Denies mucositis or sore throat Respiratory: Denies cough, dyspnea or wheezes Cardiovascular: Denies palpitation, chest discomfort or lower extremity swelling Gastrointestinal:  Denies nausea, heartburn or change in bowel habits Skin: Denies abnormal skin rashes Lymphatics: Denies new lymphadenopathy or easy bruising Neurological:Denies numbness, tingling or new weaknesses Behavioral/Psych: Mood is stable, no new changes  All other systems were reviewed with the patient and are negative.  I have reviewed the past medical history, past surgical history, social history and family history with the patient and they are unchanged from previous note.  ALLERGIES:  has No Known Allergies.  MEDICATIONS:  Current Outpatient Medications  Medication Sig Dispense Refill  . Acetaminophen 500 MG coapsule Take 500 mg by mouth Nightly.    Marland Kitchen alfuzosin (UROXATRAL) 10 MG 24 hr tablet TAKE 1 TABLET(10 MG) BY MOUTH DAILY WITH BREAKFAST 90 tablet 1  . aspirin 81 MG tablet Take 1 tablet (81 mg total) by mouth daily. 90 tablet 1  . Cholecalciferol (VITAMIN D) 2000 UNITS CAPS Take 1 capsule (2,000 Units total) by mouth 1 dose over 46 hours. 30 capsule 11  . diltiazem (CARDIZEM LA) 180 MG 24 hr tablet Take 1 tablet (180 mg total) by mouth daily. 90 tablet 1  . DULoxetine (CYMBALTA) 30 MG capsule TAKE ONE CAPSULE BY MOUTH EVERY DAY (Patient not taking: Reported on 05/15/2019) 90 capsule 1  . esomeprazole (NEXIUM) 40 MG capsule Take 1 capsule (40 mg total) by mouth daily. 90 capsule 0  . finasteride (PROSCAR) 5 MG tablet Take 1 tablet (5 mg total) by mouth daily. 90 tablet 1  . hydrochlorothiazide (HYDRODIURIL) 25 MG tablet Take 1 tablet (25 mg total) by mouth daily. 90 tablet 1  . latanoprost (XALATAN) 0.005 % ophthalmic solution Place 1 drop into both eyes at bedtime.    . meloxicam (MOBIC) 7.5 MG tablet Take 1 tablet (7.5 mg total) by mouth daily. 90 tablet 1  . olmesartan (BENICAR) 40 MG tablet Take 1 tablet  (40 mg total) by mouth daily. 90 tablet 1  . rosuvastatin (CRESTOR) 10 MG tablet TAKE 1 TABLET(10 MG) BY MOUTH DAILY 90 tablet 1  . Testosterone 20.25 MG/ACT (1.62%) GEL APPLY 2 ACTUATIONS ONTO THE SKIN DAILY 75 g 1  . timolol (TIMOPTIC) 0.5 % ophthalmic solution INT 1 GTT IN EACH EYE QAM  0  . vitamin B-12 (CYANOCOBALAMIN) 1000 MCG tablet Take 1,000 mcg by mouth daily.     No current facility-administered medications for this visit.     PHYSICAL EXAMINATION: ECOG PERFORMANCE STATUS: 0 - Asymptomatic  Vitals:   09/14/19 1001  BP: 114/76  Pulse: 96  Resp: 18  Temp: 98.2 F (36.8 C)  SpO2: 100%   Filed Weights   09/14/19 1001  Weight: 175 lb 9.6 oz (79.7 kg)    GENERAL:alert, no distress and comfortable SKIN: skin color, texture, turgor are normal, no rashes or significant lesions EYES: normal, Conjunctiva are pink and non-injected, sclera clear OROPHARYNX:no exudate, no erythema and lips, buccal mucosa, and tongue normal  NECK: supple, thyroid normal size, non-tender, without nodularity LYMPH:  no palpable lymphadenopathy in the cervical, axillary or inguinal LUNGS: clear to auscultation and percussion with normal breathing effort HEART: regular  rate & rhythm and no murmurs and no lower extremity edema ABDOMEN:abdomen soft, non-tender and normal bowel sounds Musculoskeletal:no cyanosis of digits and no clubbing  NEURO: alert & oriented x 3 with fluent speech, no focal motor/sensory deficits Bilateral chest wall is examined Normal exam on the left On the right, there is a 2 cm palpable lump behind the areola complex on the right which is mildly tender to deep palpation  LABORATORY DATA:  I have reviewed the data as listed    Component Value Date/Time   NA 139 09/07/2019 1109   NA 141 09/23/2018 0853   NA 140 08/18/2016 0953   K 3.8 09/07/2019 1109   K 3.5 08/18/2016 0953   CL 103 09/07/2019 1109   CO2 27 09/07/2019 1109   CO2 26 08/18/2016 0953   GLUCOSE 112 (H)  09/07/2019 1109   GLUCOSE 116 08/18/2016 0953   BUN 19 09/07/2019 1109   BUN 14 09/23/2018 0853   BUN 17.2 08/18/2016 0953   CREATININE 1.35 (H) 09/07/2019 1109   CREATININE 1.1 08/18/2016 0953   CALCIUM 9.6 09/07/2019 1109   CALCIUM 9.2 08/18/2016 0953   PROT 7.9 09/07/2019 1109   PROT 6.7 09/23/2018 0853   PROT 7.6 08/18/2016 0953   ALBUMIN 4.8 09/07/2019 1109   ALBUMIN 4.5 09/23/2018 0853   ALBUMIN 3.9 08/18/2016 0953   AST 31 09/07/2019 1109   AST 21 08/18/2016 0953   ALT 40 09/07/2019 1109   ALT 24 08/18/2016 0953   ALKPHOS 51 09/07/2019 1109   ALKPHOS 59 08/18/2016 0953   BILITOT 0.4 09/07/2019 1109   BILITOT 0.2 09/23/2018 0853   BILITOT 0.95 08/18/2016 0953   GFRNONAA 54 (L) 09/07/2019 1109   GFRAA >60 09/07/2019 1109    No results found for: SPEP, UPEP  Lab Results  Component Value Date   WBC 8.5 09/07/2019   NEUTROABS 4.4 09/07/2019   HGB 14.9 09/07/2019   HCT 42.0 09/07/2019   MCV 87.5 09/07/2019   PLT 281 09/07/2019      Chemistry      Component Value Date/Time   NA 139 09/07/2019 1109   NA 141 09/23/2018 0853   NA 140 08/18/2016 0953   K 3.8 09/07/2019 1109   K 3.5 08/18/2016 0953   CL 103 09/07/2019 1109   CO2 27 09/07/2019 1109   CO2 26 08/18/2016 0953   BUN 19 09/07/2019 1109   BUN 14 09/23/2018 0853   BUN 17.2 08/18/2016 0953   CREATININE 1.35 (H) 09/07/2019 1109   CREATININE 1.1 08/18/2016 0953   GLU 102 08/02/2015      Component Value Date/Time   CALCIUM 9.6 09/07/2019 1109   CALCIUM 9.2 08/18/2016 0953   ALKPHOS 51 09/07/2019 1109   ALKPHOS 59 08/18/2016 0953   AST 31 09/07/2019 1109   AST 21 08/18/2016 0953   ALT 40 09/07/2019 1109   ALT 24 08/18/2016 0953   BILITOT 0.4 09/07/2019 1109   BILITOT 0.2 09/23/2018 0853   BILITOT 0.95 08/18/2016 0953     All questions were answered. The patient knows to call the clinic with any problems, questions or concerns. No barriers to learning was detected.  I spent 25 minutes counseling  the patient face to face. The total time spent in the appointment was 30 minutes and more than 50% was on counseling and review of test results  Heath Lark, MD 09/14/2019 10:35 AM

## 2019-09-14 NOTE — Assessment & Plan Note (Addendum)
He has palpable breast mass on the right behind the areola complex He has no clinical signs of gynecomastia It could be related to his medication such as Proscar but given family history of breast cancer in his mother, I recommend imaging study with diagnostic mammogram for further evaluation I will also alert our breast navigator to help streamline referral to general surgery if needed He is advised to stop alcohol intake and to stop Proscar

## 2019-09-14 NOTE — Assessment & Plan Note (Signed)
The patient is noted to have IgG lambda MGUS. According to his recent blood work, and there were nothing to suggest end organ damage. So far, he has no signs of disease progression I spent a lot of time educating the patient the natural history of MGUS I will see him once a year with repeat labs and examination We discussed the importance of preventive care and reviewed the vaccination programs. He does not have any prior allergic reactions to influenza vaccination. He agrees to proceed with influenza vaccination today and we will administer it today at the clinic.

## 2019-09-14 NOTE — Assessment & Plan Note (Signed)
He was fasting when he came in for his blood test last week Based on current results, the patient is diabetic I recommend close follow-up with primary care doctor for management

## 2019-09-14 NOTE — Assessment & Plan Note (Signed)
This could be related to his fasting state I recommend hydration

## 2019-09-15 ENCOUNTER — Telehealth: Payer: Self-pay | Admitting: Hematology and Oncology

## 2019-09-15 NOTE — Telephone Encounter (Signed)
I left a message regarding schedule  

## 2019-09-18 ENCOUNTER — Ambulatory Visit
Admission: RE | Admit: 2019-09-18 | Discharge: 2019-09-18 | Disposition: A | Discharge status: Home / Self Care | Payer: PPO | Source: Ambulatory Visit | Attending: Hematology and Oncology | Admitting: Hematology and Oncology

## 2019-09-18 ENCOUNTER — Other Ambulatory Visit: Payer: Self-pay

## 2019-09-18 DIAGNOSIS — N6310 Unspecified lump in the right breast, unspecified quadrant: Secondary | ICD-10-CM

## 2019-09-18 DIAGNOSIS — R921 Mammographic calcification found on diagnostic imaging of breast: Secondary | ICD-10-CM | POA: Diagnosis not present

## 2019-09-18 DIAGNOSIS — N6011 Diffuse cystic mastopathy of right breast: Secondary | ICD-10-CM | POA: Diagnosis not present

## 2019-09-18 DIAGNOSIS — N6489 Other specified disorders of breast: Secondary | ICD-10-CM | POA: Diagnosis not present

## 2019-09-19 ENCOUNTER — Encounter: Payer: Self-pay | Admitting: Hematology and Oncology

## 2019-09-19 ENCOUNTER — Telehealth: Payer: Self-pay | Admitting: Hematology and Oncology

## 2019-09-19 NOTE — Telephone Encounter (Signed)
Left multiple voicemail with the patient to follow-up on results of the diagnostic mammogram and ultrasound that revealed benign pathology I will try to call the patient again later in the week

## 2019-09-22 DIAGNOSIS — M4712 Other spondylosis with myelopathy, cervical region: Secondary | ICD-10-CM | POA: Diagnosis not present

## 2019-09-22 DIAGNOSIS — M545 Low back pain: Secondary | ICD-10-CM | POA: Diagnosis not present

## 2019-09-22 DIAGNOSIS — M5136 Other intervertebral disc degeneration, lumbar region: Secondary | ICD-10-CM | POA: Diagnosis not present

## 2019-09-22 DIAGNOSIS — I10 Essential (primary) hypertension: Secondary | ICD-10-CM | POA: Diagnosis not present

## 2019-09-25 DIAGNOSIS — H43392 Other vitreous opacities, left eye: Secondary | ICD-10-CM | POA: Diagnosis not present

## 2019-09-25 DIAGNOSIS — H43812 Vitreous degeneration, left eye: Secondary | ICD-10-CM | POA: Diagnosis not present

## 2019-09-25 DIAGNOSIS — H401132 Primary open-angle glaucoma, bilateral, moderate stage: Secondary | ICD-10-CM | POA: Diagnosis not present

## 2019-10-08 NOTE — Progress Notes (Signed)
Cardiology Office Note:    Date:  10/09/2019   ID:  Robert Mccall, DOB May 15, 1951, MRN [id number]  PCP:  [name], MD  Cardiologist:  Mertie Moores, MD   Referring MD: [name], MD   Problem List 1. TIA 2. Chronic diastolic CHF 3. HTN 4. Hyperlipidemia    Chief Complaint  Patient presents with  . Congestive Heart Failure    April 04, 2018:     Robert Mccall is a 68 y.o. male with a hx of hypertension, hyperlipidemia,  Chronic diastolic CHF  Resents for further evaluation of chest discomfort.  Tracks his BP regularly ,  BP cuff will alert him if his heart rate is irregular.  He can feel these palpitations.  They occur spontaneously and might last for as long as 15 to 20 minutes.  They typically resolve spontaneously.  These are not associated with any specific activity.  They are not associated with any particular time a day.  Does not do any regular exercise .   Works in [employer].   Oct. 23, 2019:  Has been having trouble with his hearing that she has Mnire's disease.  He denies any episodes of chest pain or shortness of breath.  Blood pressure and heart rate are well controlled today.  Lipids from last week look good on Crestor 10 a day   Nov. 2, 2020 Robert Mccall is seen today for follow up of his HTN and chronic diastolic CHF No cp, no dyspnea.   Not exercising  Much.   Works at [employer].   Past Medical History:  Diagnosis Date  . ADHD (attention deficit hyperactivity disorder)   . Anxiety   . Benign prostatic hypertrophy   . Chronic lower back pain    surgery schedule for back 10/21/16  . Cyst of right kidney    incidental [place] 1.3cm on R (MRI abd 09/2014), follows with uro  . Depression   . GERD (gastroesophageal reflux disease)   . Glaucoma   . History of migraine headaches    no meds  . Hyperlipidemia   . Hypertension     Past Surgical History:  Procedure Laterality Date  . COLONOSCOPY  09/2007    polyps/ Deatra [place]  . HYDROCELE EXCISION Right 1990  . LUMBAR DISC SURGERY  10/21/2016  . ORCHIECTOMY Left 1970  . WISDOM TOOTH EXTRACTION      Current Medications: Current Meds  Medication Sig  . Acetaminophen 500 MG coapsule Take 500 mg by mouth Nightly.  Marland Kitchen alfuzosin (UROXATRAL) 10 MG 24 hr tablet TAKE 1 TABLET(10 MG) BY MOUTH DAILY WITH BREAKFAST  . aspirin 81 MG tablet Take 1 tablet (81 mg total) by mouth daily.  . Cholecalciferol (VITAMIN D) 2000 UNITS CAPS Take 1 capsule (2,000 Units total) by mouth 1 dose over 46 hours.  . DULoxetine (CYMBALTA) 30 MG capsule TAKE ONE CAPSULE BY MOUTH EVERY DAY  . esomeprazole (NEXIUM) 40 MG capsule Take 1 capsule (40 mg total) by mouth daily.  . finasteride (PROSCAR) 5 MG tablet Take 1 tablet (5 mg total) by mouth daily.  . hydrochlorothiazide (HYDRODIURIL) 25 MG tablet Take 1 tablet (25 mg total) by mouth daily.  Marland Kitchen latanoprost (XALATAN) 0.005 % ophthalmic solution Place 1 drop into both eyes at bedtime.  Marland Kitchen olmesartan (BENICAR) 40 MG tablet Take 1 tablet (40 mg total) by mouth daily.  . rosuvastatin (CRESTOR) 10 MG tablet TAKE 1 TABLET(10 MG) BY MOUTH DAILY  . Testosterone 20.25 MG/ACT (  1.62%) GEL APPLY 2 ACTUATIONS ONTO THE SKIN DAILY  . timolol (TIMOPTIC) 0.5 % ophthalmic solution INT 1 GTT IN EACH EYE QAM  . vitamin B-12 (CYANOCOBALAMIN) 1000 MCG tablet Take 1,000 mcg by mouth daily.  . [DISCONTINUED] diltiazem (CARDIZEM LA) 180 MG 24 hr tablet Take 1 tablet (180 mg total) by mouth daily.     Allergies:   Patient has no known allergies.   Social History   Socioeconomic History  . Marital status: Single    Spouse name: Not on file  . Number of children: Not on file  . Years of education: Not on file  . Highest education level: Not on file  Occupational History  . Occupation: Freight forwarder    Comment: Geneticist, molecular  Social Needs  . Financial resource strain: Not on file  . Food insecurity    Worry: Not on file     Inability: Not on file  . Transportation needs    Medical: Not on file    Non-medical: Not on file  Tobacco Use  . Smoking status: Never Smoker  . Smokeless tobacco: Never Used  Substance and Sexual Activity  . Alcohol use: Yes    Comment: 1-2 glasses wine per day  . Drug use: No  . Sexual activity: Yes    Comment: working with replacement  Lifestyle  . Physical activity    Days per week: Not on file    Minutes per session: Not on file  . Stress: Not on file  Relationships  . Social Herbalist on phone: Not on file    Gets together: Not on file    Attends religious service: Not on file    Active member of club or organization: Not on file    Attends meetings of clubs or organizations: Not on file    Relationship status: Not on file  Other Topics Concern  . Not on file  Social History Narrative  . Not on file     Family History: The patient's family history includes Breast cancer in his maternal grandmother; Breast cancer (age of onset: 50) in his mother; COPD in his mother; Depression in his mother; Diabetes in his father; Heart disease in his mother; Macular degeneration in his father; Osteoarthritis in his mother. There is no history of Colon cancer, Esophageal cancer, Rectal cancer, or Stomach cancer.  ROS:   Please see the history of present illness.     All other systems reviewed and are negative.  EKGs/Labs/Other Studies Reviewed:    The following studies were reviewed today:    Recent Labs: 09/07/2019: ALT 40; BUN 19; Creatinine, Ser 1.35; Hemoglobin 14.9; Platelets 281; Potassium 3.8; Sodium 139  Recent Lipid Panel    Component Value Date/Time   CHOL 114 09/23/2018 0853   TRIG 75 09/23/2018 0853   HDL 38 (L) 09/23/2018 0853   CHOLHDL 3.0 09/23/2018 0853   CHOLHDL 3 07/13/2017 0933   VLDL 21.0 07/13/2017 0933   LDLCALC 61 09/23/2018 0853   Physical Exam: Blood pressure 122/80, pulse 95, height 5\' 4"  (1.626 m), weight 175 lb 12.8 oz (79.7 kg),  SpO2 99 %.  GEN:  Well nourished, well developed in no acute distress HEENT: Normal NECK: No JVD; No carotid bruits LYMPHATICS: No lymphadenopathy CARDIAC: RRR  RESPIRATORY:  Clear to auscultation without rales, wheezing or rhonchi  ABDOMEN: Soft, non-tender, non-distended MUSCULOSKELETAL:  No edema; No deformity  SKIN: Warm and dry NEUROLOGIC:  Alert and oriented x 3  EKG:  Nov. 2, 2020   :  NSR at 95.  Normal ecg   ASSESSMENT:    1. Chronic diastolic heart failure ([place])   2. Essential hypertension   3. Mixed hyperlipidemia    PLAN:     1. Hypertension-  BP is ok HR remains a bit  High.   Will DC dilt and start metoprolol 25 mg PO BID.  Nurse visit in 4-6 weeks to check BP .  Will adjust from there    3.  Hyperlipidemia :   Managed by his primary   4.   Chronic diastolic CHF:  Stable   Needs to exercise more     Medication Adjustments/Labs and Tests Ordered: Current medicines are reviewed at length with the patient today.  Concerns regarding medicines are outlined above.  Orders Placed This Encounter  Procedures  . EKG 12-Lead   Meds ordered this encounter  Medications  . metoprolol tartrate (LOPRESSOR) 25 MG tablet    Sig: Take 1 tablet (25 mg total) by mouth 2 (two) times daily.    Dispense:  60 tablet    Refill:  11    Patient Instructions  Medication Instructions:  Your physician has recommended you make the following change in your medication: STOP Diltiazem START Metoprolol 25 mg twice daily  *If you need a refill on your cardiac medications before your next appointment, please call your pharmacy*  Lab Work: None Ordered   Testing/Procedures: None Ordered   Follow-Up: Your physician recommends that you return for a follow-up appointment in: 6 weeks with the Nurse for evaluation of your heart rate and BP    At [facility], you and your health needs are our priority.  As part of our continuing mission to provide you with exceptional  heart care, we have created designated Provider Care Teams.  These Care Teams include your primary Cardiologist (physician) and Advanced Practice Providers (APPs -  Physician Assistants and Nurse Practitioners) who all work together to provide you with the care you need, when you need it.  Your next appointment:   6 months  The format for your next appointment:   Either In Person or Virtual  Provider:   You may see Mertie Moores, MD or one of the following Advanced Practice Providers on your designated Care Team:    Richardson Dopp, PA-C  [place], [place]  Daune Perch, [place]       Signed, Mertie Moores, MD  10/09/2019 10:53 AM    [place]

## 2019-10-09 ENCOUNTER — Ambulatory Visit: Payer: PPO | Admitting: Cardiovascular Disease

## 2019-10-09 ENCOUNTER — Other Ambulatory Visit: Payer: Self-pay

## 2019-10-09 ENCOUNTER — Encounter: Payer: Self-pay | Admitting: Cardiovascular Disease

## 2019-10-09 VITALS — BP 122/80 | HR 95 | Ht 64.0 in | Wt 175.8 lb

## 2019-10-09 DIAGNOSIS — I5032 Chronic diastolic (congestive) heart failure: Secondary | ICD-10-CM

## 2019-10-09 DIAGNOSIS — I10 Essential (primary) hypertension: Secondary | ICD-10-CM

## 2019-10-09 DIAGNOSIS — E782 Mixed hyperlipidemia: Secondary | ICD-10-CM | POA: Diagnosis not present

## 2019-10-09 MED ORDER — METOPROLOL TARTRATE 25 MG PO TABS: 25.0000 mg | ORAL_TABLET | Freq: Two times a day (BID) | ORAL | 11 refills | Status: DC

## 2019-10-09 NOTE — Patient Instructions (Signed)
Medication Instructions:  Your physician has recommended you make the following change in your medication: STOP Diltiazem START Metoprolol 25 mg twice daily  *If you need a refill on your cardiac medications before your next appointment, please call your pharmacy*  Lab Work: None Ordered   Testing/Procedures: None Ordered   Follow-Up: Your physician recommends that you return for a follow-up appointment in: 6 weeks with the Nurse for evaluation of your heart rate and BP    At [facility], you and your health needs are our priority.  As part of our continuing mission to provide you with exceptional heart care, we have created designated Provider Care Teams.  These Care Teams include your primary Cardiologist (physician) and Advanced Practice Providers (APPs -  Physician Assistants and Nurse Practitioners) who all work together to provide you with the care you need, when you need it.  Your next appointment:   6 months  The format for your next appointment:   Either In Person or Virtual  Provider:   You may see Mertie Moores, MD or one of the following Advanced Practice Providers on your designated Care Team:    Richardson Dopp, PA-C  [place], [place]  Daune Perch, [place]

## 2019-10-11 ENCOUNTER — Other Ambulatory Visit: Payer: Self-pay | Admitting: Internal Medicine

## 2019-11-06 DIAGNOSIS — H401132 Primary open-angle glaucoma, bilateral, moderate stage: Secondary | ICD-10-CM | POA: Diagnosis not present

## 2019-11-06 DIAGNOSIS — H2513 Age-related nuclear cataract, bilateral: Secondary | ICD-10-CM | POA: Diagnosis not present

## 2019-11-06 DIAGNOSIS — H524 Presbyopia: Secondary | ICD-10-CM | POA: Diagnosis not present

## 2019-11-06 DIAGNOSIS — H04123 Dry eye syndrome of bilateral lacrimal glands: Secondary | ICD-10-CM | POA: Diagnosis not present

## 2019-11-06 DIAGNOSIS — H35372 Puckering of macula, left eye: Secondary | ICD-10-CM | POA: Diagnosis not present

## 2019-11-06 LAB — HM DIABETES EYE EXAM

## 2019-11-13 ENCOUNTER — Other Ambulatory Visit: Payer: Self-pay

## 2019-11-13 ENCOUNTER — Ambulatory Visit: Payer: PPO | Admitting: Nurse Practitioner

## 2019-11-13 ENCOUNTER — Encounter: Payer: Self-pay | Admitting: Internal Medicine

## 2019-11-13 VITALS — BP 90/64 | HR 64 | Ht 64.0 in | Wt 185.0 lb

## 2019-11-13 DIAGNOSIS — I5032 Chronic diastolic (congestive) heart failure: Secondary | ICD-10-CM

## 2019-11-13 DIAGNOSIS — I10 Essential (primary) hypertension: Secondary | ICD-10-CM

## 2019-11-13 MED ORDER — ROSUVASTATIN CALCIUM 10 MG PO TABS: ORAL_TABLET | ORAL | 1 refills | Status: DC

## 2019-11-13 NOTE — Progress Notes (Signed)
1.) Reason for visit: BP and pulse Check  2.) Name of MD requesting visit: Dr. Acie Fredrickson  3.) H&P: Patient presents for recheck of BP and pulse since medication changes made at office visit on 11/2.   4.) ROS related to problem: Patient reports fatigue and light-headedness. He states on a couple of occasions he had to catch himself from falling. Upon review of medications, patient thinks he may have stopped rosuvastatin instead of stopping diltiazem as advised. He has likely continue to take diltiazem 180 mg along with the metoprolol 25 mg BID. He brings a BP/HR report that shows BP readings consistently 0000000 mmHg systolic.   5.) Assessment and plan per MD: He will go home and check his pills. I reviewed his medication list with Richardson Dopp, PA for advice regarding patient's medications in the event that he has already d/c'ed the diltiazem. I advised him to d/c diltiazem and continue metoprolol 25 mg BID and measure and record HR and BP. I advised that if he gets home and discovers that he has already stopped diltiazem, to cut his olmesartan 40 mg in half to take 20 mg daily. He will send a MyChart message in 1 week to report HR and BP. I advised him to call back sooner with questions or concerns. He ambulated from the office in no acute distress and without complaint.

## 2019-11-13 NOTE — Patient Instructions (Signed)
Medication Instructions:  Check your pill box and discontinue Diltiazem if not already done Continue Rosuvastatin  If you discover that you have stopped diltiazem, break olmesartan (Benicar) in half to take 20 mg daily *If you need a refill on your cardiac medications before your next appointment, please call your pharmacy*  Lab Work: None Ordered    Testing/Procedures: None Ordered   Follow-Up: **Call to report blood pressure and pulse in 1 week [phone] (ask for [place]) or send through [place] If needed, we will schedule an appointment. Otherwise, you can plan to follow up in 6 months  At [facility], you and your health needs are our priority.  As part of our continuing mission to provide you with exceptional heart care, we have created designated Provider Care Teams.  These Care Teams include your primary Cardiologist (physician) and Advanced Practice Providers (APPs -  Physician Assistants and Nurse Practitioners) who all work together to provide you with the care you need, when you need it.  Your next appointment:   6 month(s)  The format for your next appointment:   Either In Person or Virtual  Provider:   You may see Mertie Moores, MD or one of the following Advanced Practice Providers on your designated Care Team:    Richardson Dopp, PA-C  [place], [place]  Daune Perch, [place]

## 2019-11-25 ENCOUNTER — Encounter: Payer: Self-pay | Admitting: Internal Medicine

## 2019-11-29 ENCOUNTER — Encounter: Payer: Self-pay | Admitting: Internal Medicine

## 2019-11-29 ENCOUNTER — Ambulatory Visit (INDEPENDENT_AMBULATORY_CARE_PROVIDER_SITE_OTHER): Payer: PPO | Admitting: Internal Medicine

## 2019-11-29 ENCOUNTER — Other Ambulatory Visit: Payer: Self-pay

## 2019-11-29 VITALS — BP 124/88 | HR 76 | Temp 97.6°F | Ht 64.0 in | Wt 182.4 lb

## 2019-11-29 DIAGNOSIS — I10 Essential (primary) hypertension: Secondary | ICD-10-CM

## 2019-11-29 DIAGNOSIS — E291 Testicular hypofunction: Secondary | ICD-10-CM | POA: Diagnosis not present

## 2019-11-29 DIAGNOSIS — Z0000 Encounter for general adult medical examination without abnormal findings: Secondary | ICD-10-CM

## 2019-11-29 DIAGNOSIS — N281 Cyst of kidney, acquired: Secondary | ICD-10-CM

## 2019-11-29 DIAGNOSIS — R351 Nocturia: Secondary | ICD-10-CM | POA: Diagnosis not present

## 2019-11-29 DIAGNOSIS — Z0001 Encounter for general adult medical examination with abnormal findings: Secondary | ICD-10-CM

## 2019-11-29 DIAGNOSIS — E785 Hyperlipidemia, unspecified: Secondary | ICD-10-CM

## 2019-11-29 DIAGNOSIS — D472 Monoclonal gammopathy: Secondary | ICD-10-CM

## 2019-11-29 DIAGNOSIS — N401 Enlarged prostate with lower urinary tract symptoms: Secondary | ICD-10-CM | POA: Diagnosis not present

## 2019-11-29 DIAGNOSIS — E118 Type 2 diabetes mellitus with unspecified complications: Secondary | ICD-10-CM

## 2019-11-29 DIAGNOSIS — G63 Polyneuropathy in diseases classified elsewhere: Secondary | ICD-10-CM

## 2019-11-29 DIAGNOSIS — E538 Deficiency of other specified B group vitamins: Secondary | ICD-10-CM

## 2019-11-29 DIAGNOSIS — E781 Pure hyperglyceridemia: Secondary | ICD-10-CM

## 2019-11-29 LAB — URINALYSIS, ROUTINE W REFLEX MICROSCOPIC
Bilirubin Urine: NEGATIVE
Hgb urine dipstick: NEGATIVE
Ketones, ur: NEGATIVE
Leukocytes,Ua: NEGATIVE
Nitrite: NEGATIVE
RBC / HPF: NONE SEEN (ref 0–?)
Specific Gravity, Urine: >=1.03 — AB (ref 1.000–1.030)
Total Protein, Urine: NEGATIVE
Urine Glucose: NEGATIVE
Urobilinogen, UA: 0.2 (ref 0.0–1.0)
pH: 5 (ref 5.0–8.0)

## 2019-11-29 LAB — HEMOGLOBIN A1C: Hgb A1c MFr Bld: 5.9 % (ref 4.6–6.5)

## 2019-11-29 LAB — TSH: TSH: 2.15 u[IU]/mL (ref 0.35–4.50)

## 2019-11-29 LAB — HEPATIC FUNCTION PANEL
ALT: 31 U/L (ref 0–53)
AST: 23 U/L (ref 0–37)
Albumin: 4.2 g/dL (ref 3.5–5.2)
Alkaline Phosphatase: 44 U/L (ref 39–117)
Bilirubin, Direct: 0.1 mg/dL (ref 0.0–0.3)
Total Bilirubin: 0.5 mg/dL (ref 0.2–1.2)
Total Protein: 6.7 g/dL (ref 6.0–8.3)

## 2019-11-29 LAB — LIPID PANEL
Cholesterol: 157 mg/dL (ref 0–200)
HDL: 35.7 mg/dL — ABNORMAL LOW (ref >39.00)
NonHDL: 121.7
Total CHOL/HDL Ratio: 4
Triglycerides: 299 mg/dL — ABNORMAL HIGH (ref 0.0–149.0)
VLDL: 59.8 mg/dL — ABNORMAL HIGH (ref 0.0–40.0)

## 2019-11-29 LAB — LDL CHOLESTEROL, DIRECT: Direct LDL: 76 mg/dL

## 2019-11-29 LAB — PSA: PSA: 0.34 ng/mL (ref 0.10–4.00)

## 2019-11-29 LAB — MICROALBUMIN / CREATININE URINE RATIO
Creatinine,U: 274.4 mg/dL
Microalb Creat Ratio: 0.8 mg/g (ref 0.0–30.0)
Microalb, Ur: 2.3 mg/dL — ABNORMAL HIGH (ref 0.0–1.9)

## 2019-11-29 NOTE — Patient Instructions (Signed)

## 2019-11-29 NOTE — Progress Notes (Signed)
Subjective:  Patient ID: Robert Mccall, male    DOB: [birth year]  Age: 68 y.o. MRN: [id number]  CC: Annual Exam and Hyperlipidemia  This visit occurred during the SARS-CoV-2 public health emergency.  Safety protocols were in place, including screening questions prior to the visit, additional usage of staff PPE, and extensive cleaning of exam room while observing appropriate contact time as indicated for disinfecting solutions.    HPI Robert Mccall presents for a CPX.  He complains of a 14-month history of weight gain and fatigue.  He tells me he decided to stop taking amphetamines about 3 months ago.  He is active and denies any recent episodes of chest pain, shortness of breath, palpitations, or edema.  He has chronic, unchanged low back pain.  Outpatient Medications Prior to Visit  Medication Sig Dispense Refill  . Acetaminophen 500 MG coapsule Take 500 mg by mouth Nightly.    Marland Kitchen alfuzosin (UROXATRAL) 10 MG 24 hr tablet TAKE 1 TABLET(10 MG) BY MOUTH DAILY WITH BREAKFAST 90 tablet 1  . aspirin 81 MG tablet Take 1 tablet (81 mg total) by mouth daily. 90 tablet 1  . Cholecalciferol (VITAMIN D) 2000 UNITS CAPS Take 1 capsule (2,000 Units total) by mouth 1 dose over 46 hours. 30 capsule 11  . DULoxetine (CYMBALTA) 30 MG capsule TAKE ONE CAPSULE BY MOUTH EVERY DAY 90 capsule 1  . esomeprazole (NEXIUM) 40 MG capsule Take 1 capsule (40 mg total) by mouth daily. 90 capsule 0  . finasteride (PROSCAR) 5 MG tablet Take 1 tablet (5 mg total) by mouth daily. 90 tablet 1  . hydrochlorothiazide (HYDRODIURIL) 25 MG tablet Take 1 tablet (25 mg total) by mouth daily. 90 tablet 1  . latanoprost (XALATAN) 0.005 % ophthalmic solution Place 1 drop into both eyes at bedtime.    . metoprolol tartrate (LOPRESSOR) 25 MG tablet Take 1 tablet (25 mg total) by mouth 2 (two) times daily. 60 tablet 11  . olmesartan (BENICAR) 40 MG tablet Take 1 tablet (40 mg total) by mouth daily. 90 tablet 1  . rosuvastatin  (CRESTOR) 10 MG tablet TAKE 1 TABLET(10 MG) BY MOUTH DAILY 90 tablet 1  . Testosterone 20.25 MG/ACT (1.62%) GEL APPLY 2 ACTUATIONS ONTO THE SKIN DAILY 75 g 1  . timolol (TIMOPTIC) 0.5 % ophthalmic solution INT 1 GTT IN EACH EYE QAM  0  . vitamin B-12 (CYANOCOBALAMIN) 1000 MCG tablet Take 1,000 mcg by mouth daily.     No facility-administered medications prior to visit.    ROS Review of Systems  Constitutional: Positive for fatigue and unexpected weight change. Negative for activity change, appetite change and diaphoresis.  HENT: Negative.   Eyes: Negative.   Respiratory: Negative for cough, chest tightness, shortness of breath and wheezing.   Cardiovascular: Negative for chest pain, palpitations and leg swelling.  Gastrointestinal: Negative for abdominal pain, constipation, diarrhea, nausea and vomiting.  Endocrine: Negative.   Genitourinary: Negative.  Negative for difficulty urinating, hematuria, scrotal swelling, testicular pain and urgency.  Musculoskeletal: Positive for back pain. Negative for arthralgias, myalgias and neck pain.  Skin: Negative for color change, pallor and rash.  Neurological: Negative.  Negative for dizziness, weakness and numbness.  Hematological: Negative for adenopathy. Does not bruise/bleed easily.  Psychiatric/Behavioral: Negative.     Objective:  BP 124/88 (BP Location: Left Arm, Patient Position: Sitting, Cuff Size: Large)   Pulse 76   Temp 97.6 F (36.4 C) (Oral)   Ht 5\' 4"  (1.626 m)   Wt  182 lb 6 oz (82.7 kg)   SpO2 99%   BMI 31.30 kg/m   BP Readings from Last 3 Encounters:  11/29/19 124/88  11/13/19 90/64  10/09/19 122/80    Wt Readings from Last 3 Encounters:  11/29/19 182 lb 6 oz (82.7 kg)  11/13/19 185 lb (83.9 kg)  10/09/19 175 lb 12.8 oz (79.7 kg)    Physical Exam Vitals reviewed.  Constitutional:      Appearance: Normal appearance. He is obese.  HENT:     Nose: Nose normal.     Mouth/Throat:     Mouth: Mucous membranes are  moist.  Eyes:     General: No scleral icterus.    Conjunctiva/sclera: Conjunctivae normal.  Cardiovascular:     Rate and Rhythm: Normal rate and regular rhythm.     Heart sounds: No murmur.  Pulmonary:     Effort: Pulmonary effort is normal.     Breath sounds: No stridor. No wheezing, rhonchi or rales.  Abdominal:     General: Abdomen is flat. Bowel sounds are normal. There is no distension.     Palpations: Abdomen is soft. There is no hepatomegaly or splenomegaly.     Tenderness: There is no abdominal tenderness.     Hernia: No hernia is present. There is no hernia in the left inguinal area or right inguinal area.  Genitourinary:    Pubic Area: No rash.      Penis: Normal and circumcised. No discharge, swelling or lesions.      Testes: Normal.        Right: Mass, tenderness or swelling not present.        Left: Mass, tenderness or swelling not present.     Epididymis:     Right: Normal. Not inflamed or enlarged. No mass.     Left: Not inflamed or enlarged. No mass.     Prostate: Enlarged (1+ smooth symm BPH). Not tender and no nodules present.     Rectum: Normal. Guaiac result negative. No mass, tenderness, anal fissure, external hemorrhoid or internal hemorrhoid. Normal anal tone.  Musculoskeletal:        General: Normal range of motion.     Cervical back: Neck supple.     Right lower leg: No edema.     Left lower leg: No edema.  Lymphadenopathy:     Cervical: No cervical adenopathy.     Lower Body: No right inguinal adenopathy. No left inguinal adenopathy.  Skin:    General: Skin is warm and dry.  Neurological:     General: No focal deficit present.     Mental Status: He is alert.  Psychiatric:        Mood and Affect: Mood normal.        Behavior: Behavior normal.     Lab Results  Component Value Date   WBC 8.5 09/07/2019   HGB 14.9 09/07/2019   HCT 42.0 09/07/2019   PLT 281 09/07/2019   GLUCOSE 112 (H) 09/07/2019   CHOL 157 11/29/2019   TRIG 299.0 (H)  11/29/2019   HDL 35.70 (L) 11/29/2019   LDLDIRECT 76.0 11/29/2019   LDLCALC 61 09/23/2018   ALT 31 11/29/2019   AST 23 11/29/2019   NA 139 09/07/2019   K 3.8 09/07/2019   CL 103 09/07/2019   CREATININE 1.35 (H) 09/07/2019   BUN 19 09/07/2019   CO2 27 09/07/2019   TSH 2.15 11/29/2019   PSA 0.34 11/29/2019   INR 1.04 01/23/2017   HGBA1C 5.9  11/29/2019   MICROALBUR 2.3 (H) 11/29/2019    US BREAST LTD UNI RIGHT INC AXILLA  Result Date: 09/18/2019 CLINICAL DATA:  68 year old male presenting for evaluation of a palpable area in the upper outer to superior right breast. He has family history of breast cancer in his mother at age 38 and his maternal grandmother. EXAM: DIGITAL DIAGNOSTIC BILATERAL MAMMOGRAM WITH CAD AND TOMO ULTRASOUND RIGHT BREAST COMPARISON:  None. ACR Breast Density Category a: The breast tissue is almost entirely fatty. FINDINGS: In the upper-outer quadrant of the right breast, there are a few smooth coarse calcifications, consistent with dystrophic calcium deposits. No suspicious calcifications, masses or areas of distortion are seen in the bilateral breasts. Mammographic images were processed with CAD. On physical exam, there is thickening in the periareolar upper outer and superior right breast, asymmetric to the contralateral side. Ultrasound of the superior left breast shows a band of echogenic tissue with multiple associated anechoic spaces consistent with fat necrosis. The total area measures at least 4 cm. The largest of the oil cysts measures 1.1 x 0.2 x 0.8 cm. IMPRESSION: 1. There is benign fat necrosis in the superior right breast in the area of thickening. 2.  No mammographic evidence of malignancy in the bilateral breasts. RECOMMENDATION: Clinical follow-up recommended. Any further workup should be based on clinical grounds. I have discussed the findings and recommendations with the patient. If applicable, a reminder letter will be sent to the patient regarding the  next appointment. BI-RADS CATEGORY  2: Benign. Electronically Signed   By: Ammie Ferrier M.D.   On: 09/18/2019 11:03   MM DIAG BREAST TOMO BILATERAL  Result Date: 09/18/2019 CLINICAL DATA:  68 year old male presenting for evaluation of a palpable area in the upper outer to superior right breast. He has family history of breast cancer in his mother at age 59 and his maternal grandmother. EXAM: DIGITAL DIAGNOSTIC BILATERAL MAMMOGRAM WITH CAD AND TOMO ULTRASOUND RIGHT BREAST COMPARISON:  None. ACR Breast Density Category a: The breast tissue is almost entirely fatty. FINDINGS: In the upper-outer quadrant of the right breast, there are a few smooth coarse calcifications, consistent with dystrophic calcium deposits. No suspicious calcifications, masses or areas of distortion are seen in the bilateral breasts. Mammographic images were processed with CAD. On physical exam, there is thickening in the periareolar upper outer and superior right breast, asymmetric to the contralateral side. Ultrasound of the superior left breast shows a band of echogenic tissue with multiple associated anechoic spaces consistent with fat necrosis. The total area measures at least 4 cm. The largest of the oil cysts measures 1.1 x 0.2 x 0.8 cm. IMPRESSION: 1. There is benign fat necrosis in the superior right breast in the area of thickening. 2.  No mammographic evidence of malignancy in the bilateral breasts. RECOMMENDATION: Clinical follow-up recommended. Any further workup should be based on clinical grounds. I have discussed the findings and recommendations with the patient. If applicable, a reminder letter will be sent to the patient regarding the next appointment. BI-RADS CATEGORY  2: Benign. Electronically Signed   By: Ammie Ferrier M.D.   On: 09/18/2019 11:03    Assessment & Plan:   Robert Mccall was seen today for annual exam and hyperlipidemia.  Diagnoses and all orders for this visit:  Essential hypertension- His blood  pressure is adequately well controlled -     TSH -     Urinalysis, Routine w reflex microscopic  Hyperlipidemia with target LDL less than 130- He has achieved  his LDL goal is doing well on the statin -     Lipid panel -     TSH -     Hepatic function panel  Kidney cysts- No complications noted -     Urinalysis, Routine w reflex microscopic  BPH associated with nocturia- His PSA is low which is reassuring that he does not have prostate cancer.  His symptoms are adequately well controlled. -     Urinalysis, Routine w reflex microscopic -     PSA  B12 deficiency  Routine general medical examination at a health care facility- Exam completed, labs reviewed, vaccines reviewed and updated, cancer screenings are all up-to-date.  Type II diabetes mellitus with manifestations ([place])- His blood sugars are very well controlled. -     Hemoglobin A1c -     Urinalysis, Routine w reflex microscopic -     Microalbumin / creatinine urine ratio  Hypogonadism male- His testosterone level is normal now -     Testosterone Total,Free,Bio, Males  Pure hypertriglyceridemia- I have asked him to start taking icosapent ethyl to reduce the risk of complications and for CV risk reduction.  Of also recommended that he improve his lifestyle modifications. -     icosapent Ethyl (VASCEPA) 1 g capsule; Take 2 capsules (2 g total) by mouth 2 (two) times daily.  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 Straughter start on icosapent Ethyl. I am also having him maintain his Vitamin D, vitamin B-12, Acetaminophen, esomeprazole, Testosterone, aspirin, timolol, DULoxetine, latanoprost, hydrochlorothiazide, alfuzosin, finasteride, olmesartan, metoprolol tartrate, and rosuvastatin.  Meds ordered this encounter  Medications  . icosapent Ethyl (VASCEPA) 1 g capsule    Sig: Take 2 capsules (2 g total) by mouth 2 (two)  times daily.    Dispense:  360 capsule    Refill:  1     Follow-up: Return in about 6 months (around 05/29/2020).  Scarlette Calico, MD

## 2019-11-30 ENCOUNTER — Encounter: Payer: Self-pay | Admitting: Internal Medicine

## 2019-11-30 DIAGNOSIS — E781 Pure hyperglyceridemia: Secondary | ICD-10-CM | POA: Insufficient documentation

## 2019-11-30 LAB — TESTOSTERONE TOTAL,FREE,BIO, MALES
Albumin: 4.3 g/dL (ref 3.6–5.1)
Sex Hormone Binding: 35 nmol/L (ref 22–77)
Testosterone, Bioavailable: 121.7 ng/dL (ref >=110.0)
Testosterone, Free: 61.8 pg/mL (ref 46.0–224.0)
Testosterone: 478 ng/dL (ref 250–827)

## 2019-11-30 MED ORDER — ICOSAPENT ETHYL 1 G PO CAPS: 2.0000 g | ORAL_CAPSULE | Freq: Two times a day (BID) | ORAL | 1 refills | Status: DC

## 2019-12-19 ENCOUNTER — Other Ambulatory Visit: Payer: Self-pay | Admitting: Internal Medicine

## 2020-01-09 ENCOUNTER — Encounter: Payer: Self-pay | Admitting: Internal Medicine

## 2020-01-11 ENCOUNTER — Other Ambulatory Visit: Payer: Self-pay

## 2020-01-11 ENCOUNTER — Ambulatory Visit (INDEPENDENT_AMBULATORY_CARE_PROVIDER_SITE_OTHER): Payer: PPO | Admitting: Internal Medicine

## 2020-01-11 ENCOUNTER — Encounter: Payer: Self-pay | Admitting: Internal Medicine

## 2020-01-11 VITALS — BP 118/72 | HR 68 | Temp 97.6°F | Ht 64.0 in | Wt 178.0 lb

## 2020-01-11 DIAGNOSIS — F900 Attention-deficit hyperactivity disorder, predominantly inattentive type: Secondary | ICD-10-CM | POA: Diagnosis not present

## 2020-01-11 DIAGNOSIS — H608X3 Other otitis externa, bilateral: Secondary | ICD-10-CM | POA: Insufficient documentation

## 2020-01-11 MED ORDER — NEOMYCIN-POLYMYXIN-HC 3.5-10000-1 OT SOLN: 3.0000 [drp] | Freq: Three times a day (TID) | OTIC | 1 refills | Status: DC

## 2020-01-11 MED ORDER — METHYLPHENIDATE HCL ER (LA) 20 MG PO CP24: 20.0000 mg | ORAL_CAPSULE | Freq: Every day | ORAL | 0 refills | Status: DC

## 2020-01-11 MED ORDER — METHYLPHENIDATE HCL ER 20 MG PO TBCR: 20.0000 mg | EXTENDED_RELEASE_TABLET | Freq: Every day | ORAL | 0 refills | Status: DC

## 2020-01-11 NOTE — Progress Notes (Signed)
Subjective:  Patient ID: Robert Mccall, male    DOB: 1951/03/16  Age: 69 y.o. MRN: [id number]  CC: Otalgia and ADHD  This visit occurred during the SARS-CoV-2 public health emergency.  Safety protocols were in place, including screening questions prior to the visit, additional usage of staff PPE, and extensive cleaning of exam room while observing appropriate contact time as indicated for disinfecting solutions.    HPI Robert Mccall presents for f/up -  1. He complains of a several week hx of itching and discomfort in both ears. He uses Qtips and thinks he sees blood on the cotton.  2. He complains of fatigue, poor concentration, and lack of motivation and wants to restart ritalin.  Outpatient Medications Prior to Visit  Medication Sig Dispense Refill  . Acetaminophen 500 MG coapsule Take 500 mg by mouth Nightly.    Marland Kitchen alfuzosin (UROXATRAL) 10 MG 24 hr tablet TAKE 1 TABLET(10 MG) BY MOUTH DAILY WITH BREAKFAST 90 tablet 1  . aspirin 81 MG tablet Take 1 tablet (81 mg total) by mouth daily. 90 tablet 1  . Cholecalciferol (VITAMIN D) 2000 UNITS CAPS Take 1 capsule (2,000 Units total) by mouth 1 dose over 46 hours. 30 capsule 11  . DULoxetine (CYMBALTA) 30 MG capsule TAKE 1 CAPSULE BY MOUTH EVERY DAY 90 capsule 1  . esomeprazole (NEXIUM) 40 MG capsule Take 1 capsule (40 mg total) by mouth daily. 90 capsule 0  . finasteride (PROSCAR) 5 MG tablet Take 1 tablet (5 mg total) by mouth daily. 90 tablet 1  . hydrochlorothiazide (HYDRODIURIL) 25 MG tablet Take 1 tablet (25 mg total) by mouth daily. 90 tablet 1  . icosapent Ethyl (VASCEPA) 1 g capsule Take 2 capsules (2 g total) by mouth 2 (two) times daily. 360 capsule 1  . latanoprost (XALATAN) 0.005 % ophthalmic solution Place 1 drop into both eyes at bedtime.    . metoprolol tartrate (LOPRESSOR) 25 MG tablet Take 1 tablet (25 mg total) by mouth 2 (two) times daily. 60 tablet 11  . olmesartan (BENICAR) 40 MG tablet Take 1 tablet (40 mg  total) by mouth daily. 90 tablet 1  . rosuvastatin (CRESTOR) 10 MG tablet TAKE 1 TABLET(10 MG) BY MOUTH DAILY 90 tablet 1  . Testosterone 20.25 MG/ACT (1.62%) GEL APPLY 2 ACTUATIONS ONTO THE SKIN DAILY 75 g 1  . timolol (TIMOPTIC) 0.5 % ophthalmic solution INT 1 GTT IN EACH EYE QAM  0  . vitamin B-12 (CYANOCOBALAMIN) 1000 MCG tablet Take 1,000 mcg by mouth daily.     No facility-administered medications prior to visit.    ROS Review of Systems  Constitutional: Positive for fatigue. Negative for unexpected weight change.  HENT: Positive for ear discharge, ear pain and hearing loss. Negative for sore throat and trouble swallowing.        Wears hearing aids  Respiratory: Negative for cough, chest tightness, shortness of breath and wheezing.   Gastrointestinal: Negative for abdominal pain, constipation, diarrhea, nausea and vomiting.  Genitourinary: Negative.  Negative for difficulty urinating.  Musculoskeletal: Negative.  Negative for arthralgias and myalgias.  Skin: Negative.  Negative for color change.  Neurological: Negative.  Negative for dizziness, weakness, light-headedness and headaches.  Hematological: Negative for adenopathy. Does not bruise/bleed easily.  Psychiatric/Behavioral: Positive for decreased concentration and dysphoric mood. Negative for agitation, behavioral problems, confusion, self-injury, sleep disturbance and suicidal ideas. The patient is not nervous/anxious.     Objective:  BP 118/72 (BP Location: Left Arm, Patient Position: Sitting,  Cuff Size: Normal)   Pulse 68   Temp 97.6 F (36.4 C) (Oral)   Ht 5\' 4"  (1.626 m)   Wt 178 lb (80.7 kg)   SpO2 97%   BMI 30.55 kg/m   BP Readings from Last 3 Encounters:  01/11/20 118/72  11/29/19 124/88  11/13/19 90/64    Wt Readings from Last 3 Encounters:  01/11/20 178 lb (80.7 kg)  11/29/19 182 lb 6 oz (82.7 kg)  11/13/19 185 lb (83.9 kg)    Physical Exam Vitals reviewed.  HENT:     Right Ear: Tympanic  membrane normal. Decreased hearing noted. No drainage or swelling. There is no impacted cerumen.     Left Ear: Tympanic membrane normal. Decreased hearing noted. No drainage or swelling. There is no impacted cerumen.     Ears:     Comments: Scant flaking and exudate in both EAC's    Mouth/Throat:     Mouth: Mucous membranes are moist.  Eyes:     General: No scleral icterus.    Conjunctiva/sclera: Conjunctivae normal.  Cardiovascular:     Rate and Rhythm: Normal rate and regular rhythm.     Heart sounds: No murmur.  Pulmonary:     Effort: Pulmonary effort is normal.     Breath sounds: No stridor. No wheezing, rhonchi or rales.  Abdominal:     General: Abdomen is flat. Bowel sounds are normal. There is no distension.     Palpations: Abdomen is soft. There is no hepatomegaly or splenomegaly.     Tenderness: There is no abdominal tenderness.  Musculoskeletal:        General: Normal range of motion.     Cervical back: Neck supple.  Lymphadenopathy:     Cervical: No cervical adenopathy.  Skin:    General: Skin is dry.  Neurological:     General: No focal deficit present.  Psychiatric:        Mood and Affect: Mood normal.        Thought Content: Thought content normal.        Judgment: Judgment normal.     Lab Results  Component Value Date   WBC 8.5 09/07/2019   HGB 14.9 09/07/2019   HCT 42.0 09/07/2019   PLT 281 09/07/2019   GLUCOSE 112 (H) 09/07/2019   CHOL 157 11/29/2019   TRIG 299.0 (H) 11/29/2019   HDL 35.70 (L) 11/29/2019   LDLDIRECT 76.0 11/29/2019   LDLCALC 61 09/23/2018   ALT 31 11/29/2019   AST 23 11/29/2019   NA 139 09/07/2019   K 3.8 09/07/2019   CL 103 09/07/2019   CREATININE 1.35 (H) 09/07/2019   BUN 19 09/07/2019   CO2 27 09/07/2019   TSH 2.15 11/29/2019   PSA 0.34 11/29/2019   INR 1.04 01/23/2017   HGBA1C 5.9 11/29/2019   MICROALBUR 2.3 (H) 11/29/2019    US BREAST LTD UNI RIGHT INC AXILLA  Result Date: 09/18/2019 CLINICAL DATA:  69 year old  male presenting for evaluation of a palpable area in the upper outer to superior right breast. He has family history of breast cancer in his mother at age 54 and his maternal grandmother. EXAM: DIGITAL DIAGNOSTIC BILATERAL MAMMOGRAM WITH CAD AND TOMO ULTRASOUND RIGHT BREAST COMPARISON:  None. ACR Breast Density Category a: The breast tissue is almost entirely fatty. FINDINGS: In the upper-outer quadrant of the right breast, there are a few smooth coarse calcifications, consistent with dystrophic calcium deposits. No suspicious calcifications, masses or areas of distortion are seen in the  bilateral breasts. Mammographic images were processed with CAD. On physical exam, there is thickening in the periareolar upper outer and superior right breast, asymmetric to the contralateral side. Ultrasound of the superior left breast shows a band of echogenic tissue with multiple associated anechoic spaces consistent with fat necrosis. The total area measures at least 4 cm. The largest of the oil cysts measures 1.1 x 0.2 x 0.8 cm. IMPRESSION: 1. There is benign fat necrosis in the superior right breast in the area of thickening. 2.  No mammographic evidence of malignancy in the bilateral breasts. RECOMMENDATION: Clinical follow-up recommended. Any further workup should be based on clinical grounds. I have discussed the findings and recommendations with the patient. If applicable, a reminder letter will be sent to the patient regarding the next appointment. BI-RADS CATEGORY  2: Benign. Electronically Signed   By: Ammie Ferrier M.D.   On: 09/18/2019 11:03   MM DIAG BREAST TOMO BILATERAL  Result Date: 09/18/2019 CLINICAL DATA:  69 year old male presenting for evaluation of a palpable area in the upper outer to superior right breast. He has family history of breast cancer in his mother at age 76 and his maternal grandmother. EXAM: DIGITAL DIAGNOSTIC BILATERAL MAMMOGRAM WITH CAD AND TOMO ULTRASOUND RIGHT BREAST COMPARISON:   None. ACR Breast Density Category a: The breast tissue is almost entirely fatty. FINDINGS: In the upper-outer quadrant of the right breast, there are a few smooth coarse calcifications, consistent with dystrophic calcium deposits. No suspicious calcifications, masses or areas of distortion are seen in the bilateral breasts. Mammographic images were processed with CAD. On physical exam, there is thickening in the periareolar upper outer and superior right breast, asymmetric to the contralateral side. Ultrasound of the superior left breast shows a band of echogenic tissue with multiple associated anechoic spaces consistent with fat necrosis. The total area measures at least 4 cm. The largest of the oil cysts measures 1.1 x 0.2 x 0.8 cm. IMPRESSION: 1. There is benign fat necrosis in the superior right breast in the area of thickening. 2.  No mammographic evidence of malignancy in the bilateral breasts. RECOMMENDATION: Clinical follow-up recommended. Any further workup should be based on clinical grounds. I have discussed the findings and recommendations with the patient. If applicable, a reminder letter will be sent to the patient regarding the next appointment. BI-RADS CATEGORY  2: Benign. Electronically Signed   By: Ammie Ferrier M.D.   On: 09/18/2019 11:03    Assessment & Plan:   Bartlett was seen today for otalgia and adhd.  Diagnoses and all orders for this visit:  Chronic eczematous otitis externa of both ears -     neomycin-polymyxin-hydrocortisone (CORTISPORIN) OTIC solution; Place 3 drops into both ears 3 (three) times daily.  Attention deficit hyperactivity disorder (ADHD), predominantly inattentive type -     Discontinue: methylphenidate (RITALIN LA) 20 MG 24 hr capsule; Take 1 capsule (20 mg total) by mouth daily. May substitute for generic -     methylphenidate (METADATE ER) 20 MG ER tablet; Take 1 tablet (20 mg total) by mouth daily.   I have discontinued Hardin Negus. Trachtenberg's  methylphenidate. I am also having him start on neomycin-polymyxin-hydrocortisone and methylphenidate. Additionally, I am having him maintain his Vitamin D, vitamin B-12, Acetaminophen, esomeprazole, Testosterone, aspirin, timolol, latanoprost, hydrochlorothiazide, alfuzosin, finasteride, olmesartan, metoprolol tartrate, rosuvastatin, icosapent Ethyl, and DULoxetine.  Meds ordered this encounter  Medications  . neomycin-polymyxin-hydrocortisone (CORTISPORIN) OTIC solution    Sig: Place 3 drops into both ears 3 (three)  times daily.    Dispense:  10 mL    Refill:  1  . DISCONTD: methylphenidate (RITALIN LA) 20 MG 24 hr capsule    Sig: Take 1 capsule (20 mg total) by mouth daily. May substitute for generic    Dispense:  90 capsule    Refill:  0  . methylphenidate (METADATE ER) 20 MG ER tablet    Sig: Take 1 tablet (20 mg total) by mouth daily.    Dispense:  90 tablet    Refill:  0     Follow-up: Return in about 3 months (around 04/09/2020).  Scarlette Calico, MD

## 2020-01-11 NOTE — Patient Instructions (Signed)
Otitis Externa  Otitis externa is an infection of the outer ear canal. The outer ear canal is the area between the outside of the ear and the eardrum. Otitis externa is sometimes called swimmer's ear. What are the causes? Common causes of this condition include:  Swimming in dirty water.  Moisture in the ear.  An injury to the inside of the ear.  An object stuck in the ear.  A cut or scrape on the outside of the ear. What increases the risk? You are more likely to develop this condition if you go swimming often. What are the signs or symptoms? The first symptom of this condition is often itching in the ear. Later symptoms of the condition include:  Swelling of the ear.  Redness in the ear.  Ear pain. The pain may get worse when you pull on your ear.  Pus coming from the ear. How is this diagnosed? This condition may be diagnosed by examining the ear and testing fluid from the ear for bacteria and funguses. How is this treated? This condition may be treated with:  Antibiotic ear drops. These are often given for 10-14 days.  Medicines to reduce itching and swelling. Follow these instructions at home:  If you were prescribed antibiotic ear drops, use them as told by your health care provider. Do not stop using the antibiotic even if your condition improves.  Take over-the-counter and prescription medicines only as told by your health care provider.  Avoid getting water in your ears as told by your health care provider. This may include avoiding swimming or water sports for a few days.  Keep all follow-up visits as told by your health care provider. This is important. How is this prevented?  Keep your ears dry. Use the corner of a towel to dry your ears after you swim or bathe.  Avoid scratching or putting things in your ear. Doing these things can damage the ear canal or remove the protective wax that lines it, which makes it easier for bacteria and funguses to  grow.  Avoid swimming in lakes, polluted water, or pools that may not have enough chlorine. Contact a health care provider if:  You have a fever.  Your ear is still red, swollen, painful, or draining pus after 3 days.  Your redness, swelling, or pain gets worse.  You have a severe headache.  You have redness, swelling, pain, or tenderness in the area behind your ear. Summary  Otitis externa is an infection of the outer ear canal.  Common causes include swimming in dirty water, moisture in the ear, or a cut or scrape in the ear.  Symptoms include pain, redness, and swelling of the ear.  If you were prescribed antibiotic ear drops, use them as told by your health care provider. Do not stop using the antibiotic even if your condition improves. This information is not intended to replace advice given to you by your health care provider. Make sure you discuss any questions you have with your health care provider. Document Revised: 04/29/2018 Document Reviewed: 04/29/2018 Elsevier Patient Education  2020 [employer]  

## 2020-01-15 ENCOUNTER — Ambulatory Visit: Payer: PPO | Admitting: Internal Medicine

## 2020-02-10 ENCOUNTER — Encounter: Payer: Self-pay | Admitting: Internal Medicine

## 2020-02-10 DIAGNOSIS — I10 Essential (primary) hypertension: Secondary | ICD-10-CM

## 2020-02-10 MED ORDER — HYDROCHLOROTHIAZIDE 25 MG PO TABS: 25.0000 mg | ORAL_TABLET | Freq: Every day | ORAL | 1 refills | Status: DC

## 2020-02-24 ENCOUNTER — Encounter: Payer: Self-pay | Admitting: Internal Medicine

## 2020-02-24 DIAGNOSIS — I10 Essential (primary) hypertension: Secondary | ICD-10-CM

## 2020-02-25 MED ORDER — OLMESARTAN MEDOXOMIL 40 MG PO TABS: 40.0000 mg | ORAL_TABLET | Freq: Every day | ORAL | 1 refills | Status: DC

## 2020-03-16 ENCOUNTER — Encounter: Payer: Self-pay | Admitting: Internal Medicine

## 2020-03-18 ENCOUNTER — Other Ambulatory Visit: Payer: Self-pay | Admitting: Internal Medicine

## 2020-03-18 DIAGNOSIS — F900 Attention-deficit hyperactivity disorder, predominantly inattentive type: Secondary | ICD-10-CM

## 2020-03-18 MED ORDER — METHYLPHENIDATE HCL ER 20 MG PO TBCR: 20.0000 mg | EXTENDED_RELEASE_TABLET | Freq: Every day | ORAL | 0 refills | Status: DC

## 2020-03-22 DIAGNOSIS — G8929 Other chronic pain: Secondary | ICD-10-CM | POA: Diagnosis not present

## 2020-03-22 DIAGNOSIS — M5441 Lumbago with sciatica, right side: Secondary | ICD-10-CM | POA: Diagnosis not present

## 2020-03-22 DIAGNOSIS — M4712 Other spondylosis with myelopathy, cervical region: Secondary | ICD-10-CM | POA: Diagnosis not present

## 2020-03-22 DIAGNOSIS — M5442 Lumbago with sciatica, left side: Secondary | ICD-10-CM | POA: Diagnosis not present

## 2020-03-22 DIAGNOSIS — M5136 Other intervertebral disc degeneration, lumbar region: Secondary | ICD-10-CM | POA: Diagnosis not present

## 2020-03-28 ENCOUNTER — Other Ambulatory Visit: Payer: Self-pay

## 2020-03-28 ENCOUNTER — Ambulatory Visit (INDEPENDENT_AMBULATORY_CARE_PROVIDER_SITE_OTHER): Payer: PPO

## 2020-03-28 ENCOUNTER — Encounter (HOSPITAL_COMMUNITY): Payer: Self-pay

## 2020-03-28 ENCOUNTER — Ambulatory Visit (HOSPITAL_COMMUNITY)
Admission: EM | Admit: 2020-03-28 | Discharge: 2020-03-28 | Disposition: A | Discharge status: Home / Self Care | Payer: PPO | Attending: Family Medicine | Admitting: Family Medicine

## 2020-03-28 DIAGNOSIS — S9032XA Contusion of left foot, initial encounter: Secondary | ICD-10-CM | POA: Diagnosis not present

## 2020-03-28 DIAGNOSIS — M79672 Pain in left foot: Secondary | ICD-10-CM

## 2020-03-28 HISTORY — DX: Other gastritis without bleeding: K29.60

## 2020-03-28 NOTE — ED Triage Notes (Signed)
Pt states he slipped and fell and hit the left side of his head and injured left foot. Pt states he's on ASA 81mg . Pt denies LOC. Pt c/o 5/10 constant pressure in left foot. Pt has 1+ edema of left foot. Pt came to triage in [facility]. Pt has 1+ left pedal pulse, cap refill less than 3 sec.

## 2020-03-28 NOTE — Discharge Instructions (Signed)
No fracture I expect gradual healing over the next 1-2 weeks Use anti-inflammatories for pain/swelling. You may take up to 800 mg Ibuprofen every 8 hours with food. You may supplement Ibuprofen with Tylenol [id number] mg every 8 hours.  Ice and elevate Weight bear as tolerated Ace wrap for compression  Follow up if not improving or worsening

## 2020-03-28 NOTE — ED Provider Notes (Signed)
Agenda    CSN: [id number] Arrival date & time: 03/28/20  1833      History   Chief Complaint Chief Complaint  Patient presents with  . Foot Injury    HPI Robert Mccall is a 69 y.o. male history of Mnire's disease, MGUS, hypertension, hyperlipidemia, presenting today for evaluation of left foot injury.  Patient notes that this morning Robert Mccall was leaving an apartment complex and missed a step down causing him to fall to the ground.  Robert Mccall landed on his left side.  Robert Mccall does report hitting his head, denies loss of consciousness.  Robert Mccall denies any significant headache, but has developed a mild area of swelling to his scalp above his left ear.  Denies vision changes.  Denies any increase in dizziness/lightheadedness from baseline.  Denies any nausea or vomiting.  His main concern is his left foot.  Since Robert Mccall has had pain with weightbearing and some swelling develop to the outer aspect of his left foot.  Robert Mccall denies prior injury.  Takes daily 81 mg aspirin and fish oil.  Denies anticoagulants.  HPI  Past Medical History:  Diagnosis Date  . ADHD (attention deficit hyperactivity disorder)   . Anxiety   . Benign prostatic hypertrophy   . Chronic lower back pain    surgery schedule for back 10/21/16  . Cyst of right kidney    incidental [place] 1.3cm on R (MRI abd 09/2014), follows with uro  . Depression   . GERD (gastroesophageal reflux disease)   . Glaucoma   . History of migraine headaches    no meds  . Hyperlipidemia   . Hypertension   . Menetrier's disease     Patient Active Problem List   Diagnosis Date Noted  . Chronic eczematous otitis externa of both ears 01/11/2020  . Pure hypertriglyceridemia 11/30/2019  . Elevated serum creatinine 09/14/2019  . Chronic midline low back pain with bilateral sciatica 03/13/2019  . CHF (congestive heart failure), NYHA class I, chronic, diastolic ([place]) 0000000  . Cervical disc disorder at C5-C6 level with radiculopathy 11/10/2017   . B12 deficiency 08/26/2017  . Obesity (BMI 30.0-34.9) 08/10/2017  . Diastolic dysfunction without heart failure 07/28/2017  . Hypogonadism male 07/13/2017  . Erectile dysfunction due to arterial insufficiency 07/13/2017  . Nonspecific abnormal electrocardiogram (ECG) (EKG) 07/13/2017  . History of colonic polyps 09/16/2016  . MGUS (monoclonal gammopathy of unknown significance) 06/11/2016  . 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 Coarse tremors 04/09/2016  . Routine general medical examination at a health care facility 02/03/2016  . Restless legs syndrome with nocturnal myoclonus 10/02/2015  . Acid reflux 05/07/2015  . Kidney cysts 05/07/2015  . Type II diabetes mellitus with manifestations ([place]) 11/14/2014  . OSTEOPENIA 09/24/2009  . DUPUYTREN'S CONTRACTURE, RIGHT 09/06/2008  . DEGENERATION, LUMBAR/LUMBOSACRAL DISC 07/15/2007  . Depression, controlled 06/14/2007  . Attention deficit disorder 06/14/2007  . Hyperlipidemia with target LDL less than 130 05/13/2007  . Essential hypertension 05/13/2007  . BPH associated with nocturia 05/13/2007    Past Surgical History:  Procedure Laterality Date  . COLONOSCOPY  09/2007   polyps/ [facility]  . HYDROCELE EXCISION Right 1990  . LUMBAR DISC SURGERY  10/21/2016  . ORCHIECTOMY Left 1970  . WISDOM TOOTH EXTRACTION         Home Medications    Prior to Admission medications   Medication Sig Start Date End Date Taking? Authorizing Provider  Acetaminophen 500 MG coapsule Take 500 mg by  mouth Nightly. 10/12/17   [provider]  alfuzosin (UROXATRAL) 10 MG 24 hr tablet TAKE 1 TABLET(10 MG) BY MOUTH DAILY WITH BREAKFAST 07/12/19   [name], MD  aspirin 81 MG tablet Take 1 tablet (81 mg total) by mouth daily. 08/09/18   [name], MD  Cholecalciferol (VITAMIN D) 2000 UNITS CAPS Take 1 capsule (2,000 Units total) by mouth 1 dose over 46 hours. 02/04/11   Ricard Dillon, MD  DULoxetine (CYMBALTA) 30 MG capsule TAKE 1 CAPSULE BY MOUTH EVERY DAY 12/19/19   [name], MD  esomeprazole (NEXIUM) 40 MG capsule Take 1 capsule (40 mg total) by mouth daily. 04/18/18   [name], MD  finasteride (PROSCAR) 5 MG tablet Take 1 tablet (5 mg total) by mouth daily. 07/12/19   [name], MD  hydrochlorothiazide (HYDRODIURIL) 25 MG tablet Take 1 tablet (25 mg total) by mouth daily. 02/10/20   [name], MD  icosapent Ethyl (VASCEPA) 1 g capsule Take 2 capsules (2 g total) by mouth 2 (two) times daily. 11/30/19   [name], MD  latanoprost (XALATAN) 0.005 % ophthalmic solution Place 1 drop into both eyes at bedtime. 05/08/19   [provider]  methylphenidate (METADATE ER) 20 MG ER tablet Take 1 tablet (20 mg total) by mouth daily. 03/18/20   [name], MD  metoprolol tartrate (LOPRESSOR) 25 MG tablet Take 1 tablet (25 mg total) by mouth 2 (two) times daily. 10/09/19 10/03/20  Nahser, Wonda Cheng, MD  neomycin-polymyxin-hydrocortisone (CORTISPORIN) OTIC solution Place 3 drops into both ears 3 (three) times daily. 01/11/20   [name], MD  olmesartan (BENICAR) 40 MG tablet Take 1 tablet (40 mg total) by mouth daily. 02/25/20   [name], MD  rosuvastatin (CRESTOR) 10 MG tablet TAKE 1 TABLET(10 MG) BY MOUTH DAILY 11/13/19   [name], MD  Testosterone 20.25 MG/ACT (1.62%) GEL APPLY 2 ACTUATIONS ONTO THE SKIN DAILY 05/23/18   [name], MD  timolol (TIMOPTIC) 0.5 % ophthalmic solution INT 1 GTT IN [facility] EYE QAM 06/29/18   [provider]  vitamin B-12 (CYANOCOBALAMIN) 1000 MCG tablet Take 1,000 mcg by mouth daily.    [provider]    Family History Family History  Problem Relation Age of Onset  . COPD Mother   . Heart disease Mother   . Breast cancer Mother 76  . Osteoarthritis Mother        s/p B TKR  . Depression Mother   . Macular degeneration Father   . Diabetes Father   . Breast cancer Maternal  Grandmother   . Colon cancer Neg Hx   . Esophageal cancer Neg Hx   . Rectal cancer Neg Hx   . Stomach cancer Neg Hx     Social History Social History   Tobacco Use  . Smoking status: Never Smoker  . Smokeless tobacco: Never Used  Substance Use Topics  . Alcohol use: Yes    Comment: occ  . Drug use: No     Allergies   Patient has no known allergies.   Review of Systems Review of Systems  Constitutional: Negative for fatigue and fever.  HENT: Negative for congestion, sinus pressure and sore throat.   Eyes: Negative for photophobia, pain and visual disturbance.  Respiratory: Negative for cough and shortness of breath.   Cardiovascular: Negative for chest pain.  Gastrointestinal: Negative for abdominal pain, nausea and vomiting.  Genitourinary: Negative  for decreased urine volume and hematuria.  Musculoskeletal: Positive for arthralgias, gait problem and joint swelling. Negative for myalgias, neck pain and neck stiffness.  Neurological: Positive for headaches. Negative for dizziness, syncope, facial asymmetry, speech difficulty, weakness, light-headedness and numbness.     Physical Exam Triage Vital Signs ED Triage Vitals  Enc Vitals Group     BP 03/28/20 1919 118/74     Pulse Rate 03/28/20 1919 95     Resp 03/28/20 1919 16     Temp 03/28/20 1919 98.6 F (37 C)     Temp Source 03/28/20 1919 Oral     SpO2 03/28/20 1919 100 %     Weight 03/28/20 1924 175 lb (79.4 kg)     Height 03/28/20 1924 5\' 4"  (1.626 m)     Head Circumference --      Peak Flow --      Pain Score 03/28/20 1923 5     Pain Loc --      Pain Edu? --      Excl. in [place]? --    No data found.  Updated Vital Signs BP 118/74   Pulse 95   Temp 98.6 F (37 C) (Oral)   Resp 16   Ht 5\' 4"  (1.626 m)   Wt 175 lb (79.4 kg)   SpO2 100%   BMI 30.04 kg/m   Visual Acuity Right Eye Distance:   Left Eye Distance:   Bilateral Distance:    Right Eye Near:   Left Eye Near:    Bilateral Near:      Physical Exam Vitals and nursing note reviewed.  Constitutional:      Appearance: Robert Mccall is well-developed.     Comments: No acute distress  HENT:     Head: Normocephalic and atraumatic.     Comments: Small area of swelling with slight erythematous discoloration noted within scalp just superior to left ear    Nose: Nose normal.  Eyes:     Extraocular Movements: Extraocular movements intact.     Conjunctiva/sclera: Conjunctivae normal.     Pupils: Pupils are equal, round, and reactive to light.     Comments: Wearing glasses  Cardiovascular:     Rate and Rhythm: Normal rate.  Pulmonary:     Effort: Pulmonary effort is normal. No respiratory distress.  Abdominal:     General: There is no distension.  Musculoskeletal:        General: Normal range of motion.     Cervical back: Neck supple.     Comments: Left foot: No obvious deformity or discoloration, mild swelling noted to proximal lateral dorsum of foot, tender to palpation over this area, very small superficial abrasion noted along lateral aspect of mid fifth metatarsal; nontender to lateral and medial malleolus Dorsalis pedis 2+  Skin:    General: Skin is warm and dry.  Neurological:     General: No focal deficit present.     Mental Status: Robert Mccall is alert and oriented to person, place, and time. Mental status is at baseline.     Cranial Nerves: No cranial nerve deficit.     Motor: No weakness.      UC Treatments / Results  Labs (all labs ordered are listed, but only abnormal results are displayed) Labs Reviewed - No data to display  EKG   Radiology DG Foot Complete Left  Result Date: 03/28/2020 CLINICAL DATA:  69 year old male with left foot pain. EXAM: LEFT FOOT - COMPLETE 3+ VIEW COMPARISON:  None. FINDINGS: There  is no acute fracture or dislocation. The bones are well mineralized. No significant arthritic changes. The soft tissues are unremarkable. IMPRESSION: Negative. Electronically Signed   By: Anner Crete M.D.    On: 03/28/2020 19:49    Procedures Procedures (including critical care time)  Medications Ordered in UC Medications - No data to display  Initial Impression / Assessment and Plan / UC Course  I have reviewed the triage vital signs and the nursing notes.  Pertinent labs & imaging results that were available during my care of the patient were reviewed by me and considered in my medical decision making (see chart for details).     X-ray negative for acute bony abnormality.  Most likely contusion/sprain.  Providing Ace wrap to help with compression/swelling, recommending anti-inflammatories ice and elevation, weight-bear as tolerated.  Monitor for gradual improvement.  Follow-up if symptoms not improving or worsening.  Head injury likely superficial hematoma, no neuro deficits, is not on anticoagulants.  Recommending close monitoring of this as well.  Follow-up in ED if developing any increased headache, vision changes, dizziness or lightheadedness, vomiting or difficulty speaking.  Discussed strict return precautions. Patient verbalized understanding and is agreeable with plan.  Final Clinical Impressions(s) / UC Diagnoses   Final diagnoses:  Contusion of left foot, initial encounter     Discharge Instructions     No fracture I expect gradual healing over the next 1-2 weeks Use anti-inflammatories for pain/swelling. You may take up to 800 mg Ibuprofen every 8 hours with food. You may supplement Ibuprofen with Tylenol [phone] mg every 8 hours.  Ice and elevate Weight bear as tolerated Ace wrap for compression  Follow up if not improving or worsening   ED Prescriptions    None     PDMP not reviewed this encounter.   [name], [place] 03/28/20 2043

## 2020-04-03 DIAGNOSIS — M5442 Lumbago with sciatica, left side: Secondary | ICD-10-CM | POA: Diagnosis not present

## 2020-04-09 DIAGNOSIS — M4180 Other forms of scoliosis, site unspecified: Secondary | ICD-10-CM | POA: Diagnosis not present

## 2020-04-09 DIAGNOSIS — M48061 Spinal stenosis, lumbar region without neurogenic claudication: Secondary | ICD-10-CM | POA: Diagnosis not present

## 2020-04-11 ENCOUNTER — Other Ambulatory Visit: Payer: Self-pay | Admitting: Internal Medicine

## 2020-04-11 DIAGNOSIS — N401 Enlarged prostate with lower urinary tract symptoms: Secondary | ICD-10-CM

## 2020-04-11 DIAGNOSIS — R351 Nocturia: Secondary | ICD-10-CM

## 2020-04-12 ENCOUNTER — Encounter: Payer: Self-pay | Admitting: Internal Medicine

## 2020-04-15 ENCOUNTER — Other Ambulatory Visit: Payer: Self-pay

## 2020-04-15 ENCOUNTER — Encounter: Payer: Self-pay | Admitting: Physician Assistant

## 2020-04-15 ENCOUNTER — Ambulatory Visit: Payer: PPO | Admitting: Physician Assistant

## 2020-04-15 VITALS — BP 118/68 | HR 97 | Ht 64.0 in | Wt 175.0 lb

## 2020-04-15 DIAGNOSIS — E781 Pure hyperglyceridemia: Secondary | ICD-10-CM | POA: Diagnosis not present

## 2020-04-15 DIAGNOSIS — R0602 Shortness of breath: Secondary | ICD-10-CM | POA: Diagnosis not present

## 2020-04-15 DIAGNOSIS — I10 Essential (primary) hypertension: Secondary | ICD-10-CM

## 2020-04-15 DIAGNOSIS — I200 Unstable angina: Secondary | ICD-10-CM

## 2020-04-15 DIAGNOSIS — I5032 Chronic diastolic (congestive) heart failure: Secondary | ICD-10-CM | POA: Diagnosis not present

## 2020-04-15 DIAGNOSIS — I208 Other forms of angina pectoris: Secondary | ICD-10-CM

## 2020-04-15 MED ORDER — METOPROLOL TARTRATE 25 MG PO TABS: 37.5000 mg | ORAL_TABLET | Freq: Two times a day (BID) | ORAL | 3 refills | Status: DC

## 2020-04-15 MED ORDER — METOPROLOL TARTRATE 100 MG PO TABS: ORAL_TABLET | ORAL | 0 refills | Status: DC

## 2020-04-15 NOTE — Progress Notes (Signed)
Cardiology Office Note    Date:  04/15/2020   ID:  [name], DOB 1951-03-01, MRN [id number]  PCP:  [name], MD  Cardiologist: Dr. Acie Fredrickson  Chief Complaint: 6 Months follow up  History of Present Illness:   Pilot Engles is a 69 y.o. male chronic diastolic heart failure, hypertension, hyperlipidemia presents for follow-up.  Evaluated by Dr. [name] 09/2019 for R breast lump and MGUS. No sign of gynecomastia. No evidence of malignancy by mammogram.  Recommended follow up in 1 yr.   Last seen by Dr. Acie Fredrickson 10/2019.  diltiazem changed to metoprolol due to elevated blood pressure.  Here today for follow up.  Patient reports intermittent shortness of breath at night waking him up from sleep consistent with orthopnea and PND.  He checked his vital at that time with normal blood pressure and pulse.  He also reports exertional chest tightness and shortness of breath without any radiation.  Resolved with rest.  Denies palpitation, dizziness, lower extremity edema or melena.  Uses low-sodium diet.  Past Medical History:  Diagnosis Date  . ADHD (attention deficit hyperactivity disorder)   . Anxiety   . Benign prostatic hypertrophy   . Chronic lower back pain    surgery schedule for back 10/21/16  . Cyst of right kidney    incidental [place] 1.3cm on R (MRI abd 09/2014), follows with uro  . Depression   . GERD (gastroesophageal reflux disease)   . Glaucoma   . History of migraine headaches    no meds  . Hyperlipidemia   . Hypertension   . Menetrier's disease     Past Surgical History:  Procedure Laterality Date  . COLONOSCOPY  09/2007   polyps/ Deatra Ina  . HYDROCELE EXCISION Right 1990  . LUMBAR DISC SURGERY  10/21/2016  . ORCHIECTOMY Left 1970  . WISDOM TOOTH EXTRACTION      Current Medications:  Prior to Admission medications   Medication Sig Start Date End Date Taking? Authorizing Provider  Acetaminophen 500 MG coapsule Take 500 mg by mouth Nightly.  10/12/17  Yes [provider]  alfuzosin (UROXATRAL) 10 MG 24 hr tablet TAKE 1 TABLET(10 MG) BY MOUTH DAILY WITH BREAKFAST 07/12/19  Yes [name], MD  aspirin 81 MG tablet Take 1 tablet (81 mg total) by mouth daily. 08/09/18  Yes [name], MD  Cholecalciferol (VITAMIN D) 2000 UNITS CAPS Take 1 capsule (2,000 Units total) by mouth 1 dose over 46 hours. 02/04/11  Yes Ricard Dillon, MD  DULoxetine (CYMBALTA) 30 MG capsule TAKE 1 CAPSULE BY MOUTH EVERY DAY 12/19/19  Yes [name], MD  esomeprazole (NEXIUM) 20 MG capsule Take 20 mg by mouth daily at 12 noon.   Yes [provider]  finasteride (PROSCAR) 5 MG tablet TAKE 1 TABLET(5 MG) BY MOUTH DAILY 04/11/20  Yes [name], MD  hydrochlorothiazide (HYDRODIURIL) 25 MG tablet Take 1 tablet (25 mg total) by mouth daily. 02/10/20  Yes [name], MD  latanoprost (XALATAN) 0.005 % ophthalmic solution Place 1 drop into both eyes at bedtime. 05/08/19  Yes [provider]  methylphenidate (METADATE ER) 20 MG ER tablet Take 1 tablet (20 mg total) by mouth daily. 03/18/20  Yes [name], MD  metoprolol tartrate (LOPRESSOR) 25 MG tablet Take 1 tablet (25 mg total) by mouth 2 (two) times daily. 10/09/19 10/03/20 Yes Nahser, Wonda Cheng, MD  neomycin-polymyxin-hydrocortisone (CORTISPORIN) OTIC solution Place 3 drops into both ears 3 (three)  times daily. 01/11/20  Yes [name], MD  olmesartan (BENICAR) 40 MG tablet Take 1 tablet (40 mg total) by mouth daily. 02/25/20  Yes [name], MD  rosuvastatin (CRESTOR) 10 MG tablet TAKE 1 TABLET(10 MG) BY MOUTH DAILY 11/13/19  Yes [name], MD  Testosterone 20.25 MG/ACT (1.62%) GEL APPLY 2 ACTUATIONS ONTO THE SKIN DAILY 05/23/18  Yes [name], MD  timolol (TIMOPTIC) 0.5 % ophthalmic solution INT 1 GTT IN [facility] EYE QAM 06/29/18  Yes [provider]  vitamin B-12 (CYANOCOBALAMIN) 1000 MCG tablet Take 1,000 mcg by mouth daily.   Yes [provider]     Allergies:   Patient has no known allergies.   Social History   Socioeconomic History  . Marital status: Single    Spouse name: Not on file  . Number of children: Not on file  . Years of education: Not on file  . Highest education level: Not on file  Occupational History  . Occupation: Freight forwarder    Comment: [employer]  Tobacco Use  . Smoking status: Never Smoker  . Smokeless tobacco: Never Used  Substance and Sexual Activity  . Alcohol use: Yes    Comment: occ  . Drug use: No  . Sexual activity: Yes    Comment: working with replacement  Other Topics Concern  . Not on file  Social History Narrative  . Not on file   Social Determinants of Health   Financial Resource Strain:   . Difficulty of Paying Living Expenses:   Food Insecurity:   . Worried About Charity fundraiser in the Last Year:   . Arboriculturist in the Last Year:   Transportation Needs:   . Film/video editor (Medical):   Marland Kitchen Lack of Transportation (Non-Medical):   Physical Activity:   . Days of Exercise per Week:   . Minutes of Exercise per Session:   Stress:   . Feeling of Stress :   Social Connections:   . Frequency of Communication with Friends and Family:   . Frequency of Social Gatherings with Friends and Family:   . Attends Religious Services:   . Active Member of Clubs or Organizations:   . Attends Archivist Meetings:   Marland Kitchen Marital Status:      Family History:  The patient's family history includes Breast cancer in his maternal grandmother; Breast cancer (age of onset: 69) in his mother; COPD in his mother; Depression in his mother; Diabetes in his father; Heart disease in his mother; Macular degeneration in his father; Osteoarthritis in his mother.  ROS:   Please see the history of present illness.    ROS All other systems reviewed and are negative.   PHYSICAL EXAM:   VS:  BP 118/68   Pulse 97   Ht 5\' 4"  (1.626 m)   Wt 175 lb (79.4 kg)   SpO2 95%    BMI 30.04 kg/m    GEN: Well nourished, well developed, in no acute distress  HEENT: normal  Neck: no JVD, carotid bruits, or masses Cardiac: RRR; no murmurs, rubs, or gallops,no edema  Respiratory:  clear to auscultation bilaterally, normal work of breathing GI: soft, nontender, nondistended, + BS MS: no deformity or atrophy  Skin: warm and dry, no rash Neuro:  Alert and Oriented x 3, Strength and sensation are intact Psych: euthymic mood, full affect  Wt Readings from Last 3 Encounters:  04/15/20 175 lb (79.4 kg)  03/28/20  175 lb (79.4 kg)  01/11/20 178 lb (80.7 kg)      Studies/Labs Reviewed:   EKG:  EKG is ordered today.  The ekg ordered today demonstrates normal sinus rhythm at rate of 87 bpm  Recent Labs: 09/07/2019: BUN 19; Creatinine, Ser 1.35; Hemoglobin 14.9; Platelets 281; Potassium 3.8; Sodium 139 11/29/2019: ALT 31; TSH 2.15   Lipid Panel    Component Value Date/Time   CHOL 157 11/29/2019 0901   CHOL 114 09/23/2018 0853   TRIG 299.0 (H) 11/29/2019 0901   HDL 35.70 (L) 11/29/2019 0901   HDL 38 (L) 09/23/2018 0853   CHOLHDL 4 11/29/2019 0901   VLDL 59.8 (H) 11/29/2019 0901   LDLCALC 61 09/23/2018 0853   LDLDIRECT 76.0 11/29/2019 0901    Additional studies/ records that were reviewed today include:   Stress test:  07/2017  Nuclear stress EF: 44%.  The left ventricular ejection fraction is moderately decreased (30-44%).  There was no ST segment deviation noted during stress.  This is a low risk study.   1. EF 44% with diffuse hypokinesis.  2. No evidence for ischemia/infarction on perfusion images.   Low risk study, possible nonischemic cardiomyopathy.  Would confirm EF by echo.   Echo 07/2017 Study Conclusions   - Left ventricle: The cavity size was normal. Wall thickness was  increased in a pattern of mild LVH. Systolic function was normal.  The estimated ejection fraction was in the range of 50% to 55%.  Wall motion was normal; there  were no regional wall motion  abnormalities. Doppler parameters are consistent with abnormal  left ventricular relaxation (grade 1 diastolic dysfunction).  - Aortic valve: There was mild regurgitation.  - Right atrium: The atrium was mildly dilated.   Monitor 04/2018  Sinus rhythm  No significant arrhythmias  ASSESSMENT & PLAN:    1. Exertional chest pressure and shortness of breath 2. Orthopnea 3. Chronic diastolic heart failure 4. Possible surgical clearance   Patient reports intermittent history of shortness of breath with laying down.  Exertional chest pressure and shortness of breath resolving with rest.  His symptoms concerning.  Could be anginal equivalent or weakening of heart muscle.  Low normal LV function in 2018 by echocardiogram and no ischemia on stress test.  Patient has lots of back and neck issue.  Had MRI done and possible upcoming major neck/back surgery.  I have reviewed medical management versus evaluation in detail.  After long discussion, will proceed with coronary CT to rule out CAD.  Check BNP.  Increase metoprolol to 27.5 mg twice daily.  He will let us know if worsening symptoms. Euvolemic by exam.   5. Hypertension Blood pressure stable and well controlled on current medication Increase beta-blocker as above  6.  Hyperlipidemia/triglyceridemia -Can't affort Vascepa. On fish oil.  - 11/29/2019: Cholesterol 157; HDL 35.70; Triglycerides 299.0; VLDL 59.8  Medication Adjustments/Labs and Tests Ordered: Current medicines are reviewed at length with the patient today.  Concerns regarding medicines are outlined above.  Medication changes, Labs and Tests ordered today are listed in the Patient Instructions below. Patient Instructions  Medication Instructions:  Your physician has recommended you make the following change in your medication:  1.  INCREASE the Lopressor to 1 1/2 tablets twice a day  *If you need a refill on your cardiac medications before your  next appointment, please call your pharmacy*   Lab Work: TODAY:  PRO BNP When time for CT:  BMET  If you have labs (blood work)  drawn today and your tests are completely normal, you will receive your results only by: Marland Kitchen MyChart Message (if you have MyChart) OR . A paper copy in the mail If you have any lab test that is abnormal or we need to change your treatment, we will call you to review the results.   Testing/Procedures: Your physician has requested that you have cardiac CT. Cardiac computed tomography (CT) is a painless test that uses an x-ray machine to take clear, detailed pictures of your heart. For further information please visit HugeFiesta.[email or web address]. Please follow instruction sheet BELOW:  Your cardiac CT will be scheduled at one of the below locations:   [facility] [address] [place], [place] [address] [phone]  Please arrive at [facility], at the [facility] main entrance of [facility] 30 minutes prior to test start time. Proceed to the [facility] Radiology Department (first floor) to check-in and test prep.  Please follow these instructions carefully (unless otherwise directed):  Hold all erectile dysfunction medications at least 3 days (72 hrs) prior to test.  On the Night Before the Test: . Be sure to Drink plenty of water. . Do not consume any caffeinated/decaffeinated beverages or chocolate 12 hours prior to your test. . Do not take any antihistamines 12 hours prior to your test.  On the Day of the Test: . Drink plenty of water. Do not drink any water within one hour of the test. . Do not eat any food 4 hours prior to the test. . You may take your regular medications prior to the test.  . Take metoprolol (Lopressor) 100 mg tablet  two hours prior to test. This has been sent to your pharmacy . HOLD Hydrochlorothiazide & Methylphenidate on the morning of the test.      After the Test: . Drink plenty of water. . After  receiving IV contrast, you may experience a mild flushed feeling. This is normal. . On occasion, you may experience a mild rash up to 24 hours after the test. This is not dangerous. If this occurs, you can take Benadryl 25 mg and increase your fluid intake. . If you experience trouble breathing, this can be serious. If it is severe call 911 IMMEDIATELY. If it is mild, please call our office.   Once we have confirmed authorization from your insurance company, we will call you to set up a date and time for your test.   For non-scheduling related questions, please contact the cardiac imaging nurse navigator should you have any questions/concerns: Marchia Bond, RN Navigator Cardiac Imaging [facility] [phone] office  For scheduling needs, including cancellations and rescheduling, please call [phone].          Follow-Up: At [facility], you and your health needs are our priority.  As part of our continuing mission to provide you with exceptional heart care, we have created designated Provider Care Teams.  These Care Teams include your primary Cardiologist (physician) and Advanced Practice Providers (APPs -  Physician Assistants and Nurse Practitioners) who all work together to provide you with the care you need, when you need it.  We recommend signing up for the patient portal called "MyChart".  Sign up information is provided on this After Visit Summary.  MyChart is used to connect with patients for Virtual Visits (Telemedicine).  Patients are able to view lab/test results, encounter notes, upcoming appointments, etc.  Non-urgent messages can be sent to your provider as well.  To learn more about what you can do with MyChart, go to [email or web address].    Your next appointment:   8 week(s)  The format for your next appointment:   In Person  Provider:   You may see Mertie Moores, MD or one of the following Advanced Practice Providers on your  designated Care Team:    Richardson Dopp, PA-C  Robbie Lis, PA-C    Other Instructions      Signed, Leanor Kail, [place]  04/15/2020 4:31 PM    [address] [place], [place],   [address] Phone: [phone]; Fax: [phone]

## 2020-04-15 NOTE — Patient Instructions (Addendum)
Medication Instructions:  Your physician has recommended you make the following change in your medication:  1.  INCREASE the Lopressor to 1 1/2 tablets twice a day  *If you need a refill on your cardiac medications before your next appointment, please call your pharmacy*   Lab Work: TODAY:  PRO BNP When time for CT:  BMET  If you have labs (blood work) drawn today and your tests are completely normal, you will receive your results only by: Marland Kitchen MyChart Message (if you have MyChart) OR . A paper copy in the mail If you have any lab test that is abnormal or we need to change your treatment, we will call you to review the results.   Testing/Procedures: Your physician has requested that you have cardiac CT. Cardiac computed tomography (CT) is a painless test that uses an x-ray machine to take clear, detailed pictures of your heart. For further information please visit HugeFiesta.tn. Please follow instruction sheet BELOW:  Your cardiac CT will be scheduled at one of the below locations:   [facility] [address] [place], [place] [address] [phone]  Please arrive at [facility], at the [facility] main entrance of [facility] 30 minutes prior to test start time. Proceed to the [facility] Radiology Department (first floor) to check-in and test prep.  Please follow these instructions carefully (unless otherwise directed):  Hold all erectile dysfunction medications at least 3 days (72 hrs) prior to test.  On the Night Before the Test: . Be sure to Drink plenty of water. . Do not consume any caffeinated/decaffeinated beverages or chocolate 12 hours prior to your test. . Do not take any antihistamines 12 hours prior to your test.  On the Day of the Test: . Drink plenty of water. Do not drink any water within one hour of the test. . Do not eat any food 4 hours prior to the test. . You may take your regular medications prior to the test.  . Take  metoprolol (Lopressor) 100 mg tablet  two hours prior to test. This has been sent to your pharmacy . HOLD Hydrochlorothiazide & Methylphenidate on the morning of the test.      After the Test: . Drink plenty of water. . After receiving IV contrast, you may experience a mild flushed feeling. This is normal. . On occasion, you may experience a mild rash up to 24 hours after the test. This is not dangerous. If this occurs, you can take Benadryl 25 mg and increase your fluid intake. . If you experience trouble breathing, this can be serious. If it is severe call 911 IMMEDIATELY. If it is mild, please call our office.   Once we have confirmed authorization from your insurance company, we will call you to set up a date and time for your test.   For non-scheduling related questions, please contact the cardiac imaging nurse navigator should you have any questions/concerns: Marchia Bond, RN Navigator Cardiac Imaging [facility] [phone] office  For scheduling needs, including cancellations and rescheduling, please call [phone].          Follow-Up: At [facility], you and your health needs are our priority.  As part of our continuing mission to provide you with exceptional heart care, we have created designated Provider Care Teams.  These Care Teams include your primary Cardiologist (physician) and Advanced Practice Providers (APPs -  Physician Assistants and Nurse Practitioners) who all work together to provide you  with the care you need, when you need it.  We recommend signing up for the patient portal called "MyChart".  Sign up information is provided on this After Visit Summary.  MyChart is used to connect with patients for Virtual Visits (Telemedicine).  Patients are able to view lab/test results, encounter notes, upcoming appointments, etc.  Non-urgent messages can be sent to your provider as well.   To learn more about what you can do with MyChart, go  to [email or web address].    Your next appointment:   8 week(s)  The format for your next appointment:   In Person  Provider:   You may see Mertie Moores, MD or one of the following Advanced Practice Providers on your designated Care Team:    Richardson Dopp, PA-C  Robbie Lis, [place]    Other Instructions

## 2020-04-16 ENCOUNTER — Telehealth: Payer: Self-pay | Admitting: Physician Assistant

## 2020-04-16 LAB — PRO B NATRIURETIC PEPTIDE: NT-Pro BNP: 22 pg/mL (ref 0–376)

## 2020-04-16 NOTE — Addendum Note (Signed)
Addended by: Carylon Perches on: 04/16/2020 12:10 PM   Modules accepted: Orders

## 2020-04-16 NOTE — Telephone Encounter (Signed)
Patient returning call for lab results. Transferred call to the nurse.

## 2020-04-16 NOTE — Telephone Encounter (Signed)
Spoke with pt and he has been made aware of his normal lab results and he verbalized understanding.

## 2020-04-29 ENCOUNTER — Ambulatory Visit (HOSPITAL_COMMUNITY)
Admission: RE | Admit: 2020-04-29 | Discharge: 2020-04-29 | Disposition: A | Discharge status: Home / Self Care | Payer: PPO | Source: Ambulatory Visit | Attending: Physician Assistant | Admitting: Physician Assistant

## 2020-04-29 ENCOUNTER — Other Ambulatory Visit: Payer: Self-pay

## 2020-04-29 ENCOUNTER — Encounter (HOSPITAL_COMMUNITY): Payer: Self-pay

## 2020-04-29 DIAGNOSIS — I2510 Atherosclerotic heart disease of native coronary artery without angina pectoris: Secondary | ICD-10-CM | POA: Insufficient documentation

## 2020-04-29 DIAGNOSIS — I712 Thoracic aortic aneurysm, without rupture: Secondary | ICD-10-CM | POA: Diagnosis not present

## 2020-04-29 DIAGNOSIS — I200 Unstable angina: Secondary | ICD-10-CM

## 2020-04-29 LAB — POCT I-STAT CREATININE: Creatinine, Ser: 1.5 mg/dL — ABNORMAL HIGH (ref 0.61–1.24)

## 2020-04-29 MED ORDER — IOHEXOL 350 MG/ML SOLN
80.0000 mL | Freq: Once | INTRAVENOUS | Status: AC | PRN
  Administered 2020-04-29: 80 mL via INTRAVENOUS

## 2020-04-29 MED ORDER — NITROGLYCERIN 0.4 MG SL SUBL
0.8000 mg | SUBLINGUAL_TABLET | Freq: Once | SUBLINGUAL | Status: AC
  Administered 2020-04-29: 0.8 mg via SUBLINGUAL

## 2020-04-29 MED ORDER — NITROGLYCERIN 0.4 MG SL SUBL
SUBLINGUAL_TABLET | SUBLINGUAL | Status: AC
  Filled 2020-04-29: qty 2

## 2020-05-01 ENCOUNTER — Telehealth: Payer: Self-pay | Admitting: *Deleted

## 2020-05-01 MED ORDER — ROSUVASTATIN CALCIUM 20 MG PO TABS
20.0000 mg | ORAL_TABLET | Freq: Every day | ORAL | 3 refills | Status: DC
Start: 2020-05-01 — End: 2021-03-10

## 2020-05-01 NOTE — Telephone Encounter (Signed)
-----   Message from [place], [place] sent at 05/01/2020 12:17 PM EDT ----- Coronary CT showed mild to moderate non obstructive coronary artery disease. These stenosis are not significant to warrant further evaluation by FFR study (special analysis which tell blood flow through blockage). LDL was 76 on 11/2019. Increase Crestor to 20mg  daily. Continue aspirin 81mg  daily and Lopressor 37.5mg  BID. Follow up with Dr. Acie Fredrickson as scheduled next month.

## 2020-05-05 ENCOUNTER — Other Ambulatory Visit: Payer: Self-pay | Admitting: Internal Medicine

## 2020-05-27 ENCOUNTER — Ambulatory Visit: Payer: PPO | Admitting: Cardiovascular Disease

## 2020-05-27 ENCOUNTER — Other Ambulatory Visit: Payer: Self-pay

## 2020-05-27 ENCOUNTER — Encounter: Payer: Self-pay | Admitting: Nurse Practitioner

## 2020-05-27 ENCOUNTER — Encounter: Payer: Self-pay | Admitting: Cardiovascular Disease

## 2020-05-27 VITALS — BP 92/62 | HR 70 | Ht 64.0 in | Wt 171.5 lb

## 2020-05-27 DIAGNOSIS — I5032 Chronic diastolic (congestive) heart failure: Secondary | ICD-10-CM | POA: Diagnosis not present

## 2020-05-27 DIAGNOSIS — I10 Essential (primary) hypertension: Secondary | ICD-10-CM

## 2020-05-27 DIAGNOSIS — I2510 Atherosclerotic heart disease of native coronary artery without angina pectoris: Secondary | ICD-10-CM | POA: Diagnosis not present

## 2020-05-27 DIAGNOSIS — E781 Pure hyperglyceridemia: Secondary | ICD-10-CM | POA: Diagnosis not present

## 2020-05-27 LAB — LIPID PANEL
Chol/HDL Ratio: 3.5 ratio (ref 0.0–5.0)
Cholesterol, Total: 107 mg/dL (ref 100–199)
HDL: 31 mg/dL — ABNORMAL LOW (ref >39)
LDL Chol Calc (NIH): 52 mg/dL (ref 0–99)
Triglycerides: 133 mg/dL (ref 0–149)
VLDL Cholesterol Cal: 24 mg/dL (ref 5–40)

## 2020-05-27 LAB — BASIC METABOLIC PANEL
BUN/Creatinine Ratio: 19 (ref 10–24)
BUN: 41 mg/dL — ABNORMAL HIGH (ref 8–27)
CO2: 23 mmol/L (ref 20–29)
Calcium: 9.3 mg/dL (ref 8.6–10.2)
Chloride: 103 mmol/L (ref 96–106)
Creatinine, Ser: 2.12 mg/dL — ABNORMAL HIGH (ref 0.76–1.27)
GFR calc Af Amer: 36 mL/min/{1.73_m2} — ABNORMAL LOW (ref >59)
GFR calc non Af Amer: 31 mL/min/{1.73_m2} — ABNORMAL LOW (ref >59)
Glucose: 118 mg/dL — ABNORMAL HIGH (ref 65–99)
Potassium: 4.2 mmol/L (ref 3.5–5.2)
Sodium: 141 mmol/L (ref 134–144)

## 2020-05-27 LAB — HEPATIC FUNCTION PANEL
ALT: 20 IU/L (ref 0–44)
AST: 23 IU/L (ref 0–40)
Albumin: 4.4 g/dL (ref 3.8–4.8)
Alkaline Phosphatase: 48 IU/L (ref 48–121)
Bilirubin Total: 0.4 mg/dL (ref 0.0–1.2)
Bilirubin, Direct: 0.13 mg/dL (ref 0.00–0.40)
Total Protein: 6.9 g/dL (ref 6.0–8.5)

## 2020-05-27 NOTE — Patient Instructions (Signed)
Medication Instructions:  Your physician recommends that you continue on your current medications as directed. Please refer to the Current Medication list given to you today.  *If you need a refill on your cardiac medications before your next appointment, please call your pharmacy*   Lab Work: TODAY - cholesterol, liver panel, basic metabolic panel If you have labs (blood work) drawn today and your tests are completely normal, you will receive your results only by: Marland Kitchen MyChart Message (if you have MyChart) OR . A paper copy in the mail If you have any lab test that is abnormal or we need to change your treatment, we will call you to review the results.   Testing/Procedures: None Ordered   Follow-Up: At [facility], you and your health needs are our priority.  As part of our continuing mission to provide you with exceptional heart care, we have created designated Provider Care Teams.  These Care Teams include your primary Cardiologist (physician) and Advanced Practice Providers (APPs -  Physician Assistants and Nurse Practitioners) who all work together to provide you with the care you need, when you need it.    Your next appointment:   6 month(s) on Monday Dec. 13 at 1:20 pm   The format for your next appointment:   In Person  Provider:   You may see Mertie Moores, MD or one of the following Advanced Practice Providers on your designated Care Team:    Richardson Dopp, PA-C  [place], [place]

## 2020-05-27 NOTE — Progress Notes (Signed)
Cardiology Office Note:    Date:  05/27/2020   ID:  Robert Mccall, DOB 1951-05-21, MRN [id number]  PCP:  [name], MD  Cardiologist:  Mertie Moores, MD   Referring MD: [name], MD   Problem List 1. TIA 2. Chronic diastolic CHF 3. HTN 4. Hyperlipidemia    Chief Complaint  Patient presents with  . Congestive Heart Failure    April 04, 2018:     Robert Mccall is a 69 y.o. male with a hx of hypertension, hyperlipidemia,  Chronic diastolic CHF  Resents for further evaluation of chest discomfort.  Tracks his BP regularly ,  BP cuff will alert him if his heart rate is irregular.  He can feel these palpitations.  They occur spontaneously and might last for as long as 15 to 20 minutes.  They typically resolve spontaneously.  These are not associated with any specific activity.  They are not associated with any particular time a day.  Does not do any regular exercise .   Works in [employer].   Oct. 23, 2019:  Has been having trouble with his hearing that she has Mnire's disease.  He denies any episodes of chest pain or shortness of breath.  Blood pressure and heart rate are well controlled today.  Lipids from last week look good on Crestor 10 a day   Nov. 2, 2020 Robert Mccall is seen today for follow up of his HTN and chronic diastolic CHF No cp, no dyspnea.   Not exercising  Much.   Works at [employer].   May 27, 2020: Robert Mccall is seen today for follow-up for his hypertension and chronic diastolic congestive heart failure. No CP or dyspnea.   He is very hard of hearing  -    We ordered a coronary CT angiogram.  Coronary calcium score of 523. This was 76th percentile for age and sex matched control. Coronary angiogram revealed moderate disease in the proximal/mid LAD. There was concern for possible obstruction just distal to the first diagonal at the level of the second diagonal.  There is no disease in the LAD distal to the  second diagonal.  FFR did not show any significant obstructive disease.   We discussed the importance of lipid management.  His most recent labs are from December, 2020.  His LDL is 76.  The triglyceride level is 299.  The total cholesterol is 157.  The HDL is 35.7.  No angina .   Has some CP at night when he is lying down.    Is not getting regular exercise - lots of back issues.    Past Medical History:  Diagnosis Date  . ADHD (attention deficit hyperactivity disorder)   . Anxiety   . Benign prostatic hypertrophy   . Chronic lower back pain    surgery schedule for back 10/21/16  . Cyst of right kidney    incidental Bosiak 1.3cm on R (MRI abd 09/2014), follows with uro  . Depression   . GERD (gastroesophageal reflux disease)   . Glaucoma   . History of migraine headaches    no meds  . Hyperlipidemia   . Hypertension   . Menetrier's disease     Past Surgical History:  Procedure Laterality Date  . COLONOSCOPY  09/2007   polyps/ Deatra Ina  . HYDROCELE EXCISION Right 1990  . LUMBAR DISC SURGERY  10/21/2016  . ORCHIECTOMY Left 1970  . WISDOM TOOTH EXTRACTION      Current Medications:  Current Meds  Medication Sig  . Acetaminophen 500 MG coapsule Take 500 mg by mouth Nightly.  Marland Kitchen alfuzosin (UROXATRAL) 10 MG 24 hr tablet TAKE 1 TABLET(10 MG) BY MOUTH DAILY WITH BREAKFAST  . aspirin 81 MG tablet Take 1 tablet (81 mg total) by mouth daily.  . Cholecalciferol (VITAMIN D) 2000 UNITS CAPS Take 1 capsule (2,000 Units total) by mouth 1 dose over 46 hours.  . DULoxetine (CYMBALTA) 30 MG capsule TAKE 1 CAPSULE BY MOUTH EVERY DAY  . esomeprazole (NEXIUM) 20 MG capsule Take 20 mg by mouth daily at 12 noon.  . finasteride (PROSCAR) 5 MG tablet TAKE 1 TABLET(5 MG) BY MOUTH DAILY  . hydrochlorothiazide (HYDRODIURIL) 25 MG tablet Take 1 tablet (25 mg total) by mouth daily.  Marland Kitchen latanoprost (XALATAN) 0.005 % ophthalmic solution Place 1 drop into both eyes at bedtime.  . methylphenidate  (METADATE ER) 20 MG ER tablet Take 1 tablet (20 mg total) by mouth daily.  . metoprolol tartrate (LOPRESSOR) 25 MG tablet Take 1.5 tablets (37.5 mg total) by mouth 2 (two) times daily.  Marland Kitchen olmesartan (BENICAR) 40 MG tablet Take 1 tablet (40 mg total) by mouth daily.  . rosuvastatin (CRESTOR) 20 MG tablet Take 1 tablet (20 mg total) by mouth daily.  . Testosterone 20.25 MG/ACT (1.62%) GEL APPLY 2 ACTUATIONS ONTO THE SKIN DAILY  . timolol (TIMOPTIC) 0.5 % ophthalmic solution INT 1 GTT IN EACH EYE QAM  . vitamin B-12 (CYANOCOBALAMIN) 1000 MCG tablet Take 1,000 mcg by mouth daily.     Allergies:   Patient has no known allergies.   Social History   Socioeconomic History  . Marital status: Single    Spouse name: Not on file  . Number of children: Not on file  . Years of education: Not on file  . Highest education level: Not on file  Occupational History  . Occupation: Freight forwarder    Comment: [employer]  Tobacco Use  . Smoking status: Never Smoker  . Smokeless tobacco: Never Used  Vaping Use  . Vaping Use: Never used  Substance and Sexual Activity  . Alcohol use: Yes    Comment: occ  . Drug use: No  . Sexual activity: Yes    Comment: working with replacement  Other Topics Concern  . Not on file  Social History Narrative  . Not on file   Social Determinants of Health   Financial Resource Strain:   . Difficulty of Paying Living Expenses:   Food Insecurity:   . Worried About Charity fundraiser in the Last Year:   . Arboriculturist in the Last Year:   Transportation Needs:   . Film/video editor (Medical):   Marland Kitchen Lack of Transportation (Non-Medical):   Physical Activity:   . Days of Exercise per Week:   . Minutes of Exercise per Session:   Stress:   . Feeling of Stress :   Social Connections:   . Frequency of Communication with Friends and Family:   . Frequency of Social Gatherings with Friends and Family:   . Attends Religious Services:   . Active  Member of Clubs or Organizations:   . Attends Archivist Meetings:   Marland Kitchen Marital Status:      Family History: The patient's family history includes Breast cancer in his maternal grandmother; Breast cancer (age of onset: 8) in his mother; COPD in his mother; Depression in his mother; Diabetes in his father; Heart disease in his mother; Macular  degeneration in his father; Osteoarthritis in his mother. There is no history of Colon cancer, Esophageal cancer, Rectal cancer, or Stomach cancer.  ROS:   Please see the history of present illness.     All other systems reviewed and are negative.  EKGs/Labs/Other Studies Reviewed:    The following studies were reviewed today:    Recent Labs: 09/07/2019: Hemoglobin 14.9; Platelets 281 11/29/2019: TSH 2.15 04/15/2020: NT-Pro BNP 22 05/27/2020: ALT 20; BUN 41; Creatinine, Ser 2.12; Potassium 4.2; Sodium 141  Recent Lipid Panel    Component Value Date/Time   CHOL 107 05/27/2020 1239   TRIG 133 05/27/2020 1239   HDL 31 (L) 05/27/2020 1239   CHOLHDL 3.5 05/27/2020 1239   CHOLHDL 4 11/29/2019 0901   VLDL 59.8 (H) 11/29/2019 0901   LDLCALC 52 05/27/2020 1239   LDLDIRECT 76.0 11/29/2019 0901    Physical Exam: Blood pressure 92/62, pulse 70, height 5\' 4"  (1.626 m), weight 171 lb 8 oz (77.8 kg), SpO2 97 %.  GEN:  Well nourished, well developed in no acute distress HEENT: Normal NECK: No JVD; No carotid bruits LYMPHATICS: No lymphadenopathy CARDIAC: RRR , no murmurs, rubs, gallops RESPIRATORY:  Clear to auscultation without rales, wheezing or rhonchi  ABDOMEN: Soft, non-tender, non-distended MUSCULOSKELETAL:  No edema; No deformity  SKIN: Warm and dry NEUROLOGIC:  Alert and oriented x 3,  Very hard of hearing .    EKG:       ASSESSMENT:    1. Chronic diastolic CHF (congestive heart failure) ([place])   2. Pure hypertriglyceridemia   3. Essential hypertension   4. Coronary artery disease involving native coronary artery of native  heart without angina pectoris    PLAN:     1. Hypertension-    BP is typically well controlled.   A bit low this am as he has been NPO since midnight last night  Labs returned and show a creatinine of > 2.0 with elevated BUN.  We will DC the HCTZ and follow .      3.  Hyperlipidemia :     LDL has been well controlled.    Trigs remain elevated.   4.   Chronic diastolic CHF:    Well controlled. But will need to hold HCTZ due to elevated BUN and creatinine        Medication Adjustments/Labs and Tests Ordered: Current medicines are reviewed at length with the patient today.  Concerns regarding medicines are outlined above.  Orders Placed This Encounter  Procedures  . Lipid Profile  . Hepatic function panel  . Basic Metabolic Panel (BMET)   No orders of the defined types were placed in this encounter.   Patient Instructions  Medication Instructions:  Your physician recommends that you continue on your current medications as directed. Please refer to the Current Medication list given to you today.  *If you need a refill on your cardiac medications before your next appointment, please call your pharmacy*   Lab Work: TODAY - cholesterol, liver panel, basic metabolic panel If you have labs (blood work) drawn today and your tests are completely normal, you will receive your results only by: Marland Kitchen MyChart Message (if you have MyChart) OR . A paper copy in the mail If you have any lab test that is abnormal or we need to change your treatment, we will call you to review the results.   Testing/Procedures: None Ordered   Follow-Up: At [facility], you and your health needs are our priority.  As part  of our continuing mission to provide you with exceptional heart care, we have created designated Provider Care Teams.  These Care Teams include your primary Cardiologist (physician) and Advanced Practice Providers (APPs -  Physician Assistants and Nurse Practitioners) who all work  together to provide you with the care you need, when you need it.    Your next appointment:   6 month(s) on Monday Dec. 13 at 1:20 pm   The format for your next appointment:   In Person  Provider:   You may see Mertie Moores, MD or one of the following Advanced Practice Providers on your designated Care Team:    Richardson Dopp, PA-C  Robbie Lis, [place]        Signed, Mertie Moores, MD  05/27/2020 5:58 PM    [place]

## 2020-05-28 ENCOUNTER — Telehealth: Payer: Self-pay | Admitting: Nurse Practitioner

## 2020-05-28 DIAGNOSIS — I5032 Chronic diastolic (congestive) heart failure: Secondary | ICD-10-CM

## 2020-05-28 DIAGNOSIS — I10 Essential (primary) hypertension: Secondary | ICD-10-CM

## 2020-05-28 NOTE — Telephone Encounter (Signed)
-----   Message from Thayer Headings, MD sent at 05/27/2020  5:36 PM EDT ----- Lipids look good  Liver enz look stable BMP shows acute worsening if his BUN and creatinine.   BP was low today in the office .    Lets DC HCTZ .  Recheck BMP in 2-3 weeks . Have him continue to watch his salt intake

## 2020-05-28 NOTE — Telephone Encounter (Signed)
Left detailed message and sent MyChart message regarding Dr. Elmarie Shiley advice. Advised pt to limit sodium intake to 2000 mg daily, increase water consumption to 64 oz daily and stop HCTZ. I scheduled him for repeat bmet on 7/13 and asked him to call the office with questions or concerns.

## 2020-06-05 MED ORDER — METOPROLOL TARTRATE 25 MG PO TABS: 37.5000 mg | ORAL_TABLET | Freq: Two times a day (BID) | ORAL | 3 refills | Status: DC

## 2020-06-18 ENCOUNTER — Other Ambulatory Visit: Payer: PPO

## 2020-06-18 ENCOUNTER — Other Ambulatory Visit: Payer: Self-pay

## 2020-06-18 DIAGNOSIS — I10 Essential (primary) hypertension: Secondary | ICD-10-CM | POA: Diagnosis not present

## 2020-06-18 DIAGNOSIS — I5032 Chronic diastolic (congestive) heart failure: Secondary | ICD-10-CM

## 2020-06-18 LAB — BASIC METABOLIC PANEL
BUN/Creatinine Ratio: 19 (ref 10–24)
BUN: 25 mg/dL (ref 8–27)
CO2: 21 mmol/L (ref 20–29)
Calcium: 9.2 mg/dL (ref 8.6–10.2)
Chloride: 100 mmol/L (ref 96–106)
Creatinine, Ser: 1.33 mg/dL — ABNORMAL HIGH (ref 0.76–1.27)
GFR calc Af Amer: 63 mL/min/{1.73_m2} (ref >59)
GFR calc non Af Amer: 54 mL/min/{1.73_m2} — ABNORMAL LOW (ref >59)
Glucose: 98 mg/dL (ref 65–99)
Potassium: 4.5 mmol/L (ref 3.5–5.2)
Sodium: 132 mmol/L — ABNORMAL LOW (ref 134–144)

## 2020-06-19 ENCOUNTER — Other Ambulatory Visit: Payer: Self-pay | Admitting: Internal Medicine

## 2020-06-28 ENCOUNTER — Encounter: Payer: Self-pay | Admitting: Internal Medicine

## 2020-07-01 ENCOUNTER — Encounter: Payer: Self-pay | Admitting: Internal Medicine

## 2020-07-09 ENCOUNTER — Telehealth: Payer: Self-pay | Admitting: Gastroenterology

## 2020-07-09 NOTE — Telephone Encounter (Signed)
Patient called said his father was dx w cancer by Dr. Hilarie Fredrickson so he is wondering if his recall should be sooner.

## 2020-07-09 NOTE — Telephone Encounter (Signed)
I reviewed his last colonoscopy in 2017. He had no polyps. His father being diagnosed at age 69 does not change recommendations at this point in this life. If his father was diagnosed with colon cancer at age < 17 then we would do it every 5 years, but given his last exam was normal the 10 year recommendation is recommended given his father was diagnosed later in life. Hope this makes sense. Thanks

## 2020-07-09 NOTE — Telephone Encounter (Signed)
Patient reports that his father was recently diagnosed with colon cancer at age 69. He is asking if this will affect his screening recommendations?  He is going to send information via mychart from his insurance company.

## 2020-07-09 NOTE — Telephone Encounter (Signed)
Left message for patient to call back  

## 2020-07-10 NOTE — Telephone Encounter (Signed)
Response sent to the patient via mychart

## 2020-07-16 ENCOUNTER — Encounter: Payer: Self-pay | Admitting: Internal Medicine

## 2020-07-17 NOTE — Telephone Encounter (Signed)
Check [place] registry last filled 04/08/2020.Marland KitchenJohny Chess

## 2020-07-18 ENCOUNTER — Other Ambulatory Visit: Payer: Self-pay | Admitting: Internal Medicine

## 2020-07-18 DIAGNOSIS — F900 Attention-deficit hyperactivity disorder, predominantly inattentive type: Secondary | ICD-10-CM

## 2020-07-18 MED ORDER — METHYLPHENIDATE HCL ER 20 MG PO TBCR: 20.0000 mg | EXTENDED_RELEASE_TABLET | Freq: Every day | ORAL | 0 refills | Status: DC

## 2020-07-19 DIAGNOSIS — M5136 Other intervertebral disc degeneration, lumbar region: Secondary | ICD-10-CM | POA: Diagnosis not present

## 2020-07-19 DIAGNOSIS — M48062 Spinal stenosis, lumbar region with neurogenic claudication: Secondary | ICD-10-CM | POA: Diagnosis not present

## 2020-07-30 DIAGNOSIS — N401 Enlarged prostate with lower urinary tract symptoms: Secondary | ICD-10-CM | POA: Diagnosis not present

## 2020-07-30 DIAGNOSIS — R3915 Urgency of urination: Secondary | ICD-10-CM | POA: Diagnosis not present

## 2020-07-30 DIAGNOSIS — N281 Cyst of kidney, acquired: Secondary | ICD-10-CM | POA: Diagnosis not present

## 2020-08-20 NOTE — Telephone Encounter (Signed)
Erroneous

## 2020-08-21 ENCOUNTER — Encounter: Payer: Self-pay | Admitting: Internal Medicine

## 2020-08-21 ENCOUNTER — Other Ambulatory Visit: Payer: Self-pay | Admitting: Internal Medicine

## 2020-08-21 DIAGNOSIS — F32A Depression, unspecified: Secondary | ICD-10-CM

## 2020-08-21 DIAGNOSIS — F331 Major depressive disorder, recurrent, moderate: Secondary | ICD-10-CM

## 2020-08-21 MED ORDER — LAMOTRIGINE 25 MG PO TABS: 25.0000 mg | ORAL_TABLET | Freq: Two times a day (BID) | ORAL | 0 refills | Status: DC

## 2020-08-22 ENCOUNTER — Encounter: Payer: Self-pay | Admitting: Internal Medicine

## 2020-08-23 ENCOUNTER — Encounter: Payer: Self-pay | Admitting: Internal Medicine

## 2020-08-24 ENCOUNTER — Other Ambulatory Visit: Payer: Self-pay | Admitting: Internal Medicine

## 2020-08-24 DIAGNOSIS — R351 Nocturia: Secondary | ICD-10-CM

## 2020-08-24 MED ORDER — ALFUZOSIN HCL ER 10 MG PO TB24: ORAL_TABLET | ORAL | 1 refills | Status: DC

## 2020-08-26 ENCOUNTER — Encounter (HOSPITAL_COMMUNITY): Payer: Self-pay

## 2020-08-26 ENCOUNTER — Ambulatory Visit (HOSPITAL_COMMUNITY)
Admission: EM | Admit: 2020-08-26 | Discharge: 2020-08-26 | Disposition: A | Discharge status: Home / Self Care | Payer: PPO | Attending: Emergency Medicine | Admitting: Emergency Medicine

## 2020-08-26 ENCOUNTER — Other Ambulatory Visit: Payer: Self-pay

## 2020-08-26 DIAGNOSIS — H66001 Acute suppurative otitis media without spontaneous rupture of ear drum, right ear: Secondary | ICD-10-CM | POA: Diagnosis not present

## 2020-08-26 HISTORY — DX: Unspecified hearing loss, unspecified ear: H91.90

## 2020-08-26 MED ORDER — TRAMADOL HCL 50 MG PO TABS: 50.0000 mg | ORAL_TABLET | Freq: Four times a day (QID) | ORAL | 0 refills | Status: AC | PRN

## 2020-08-26 MED ORDER — CETIRIZINE HCL 10 MG PO TABS: 10.0000 mg | ORAL_TABLET | Freq: Every day | ORAL | 0 refills | Status: DC

## 2020-08-26 MED ORDER — AMOXICILLIN 875 MG PO TABS: 875.0000 mg | ORAL_TABLET | Freq: Two times a day (BID) | ORAL | 0 refills | Status: AC

## 2020-08-26 NOTE — Discharge Instructions (Signed)
Take amoxicillin twice daily for the next 10 days. You have been prescribed tramadol to be used as needed for your pain.   Please do not drive while taking tramadol.

## 2020-08-26 NOTE — ED Provider Notes (Signed)
Emergency Department Provider Note  ____________________________________________  Time seen: Approximately 10:48 AM  I have reviewed the triage vital signs and the nursing notes.   HISTORY  Chief Complaint Otalgia   Historian Patient     HPI Robert Mccall is a 69 y.o. male presents to the emergency department with sharp, 8 out of 10 right-sided ear pain for the past 5 days.  Patient states that he has a history of Mnire's and conductive hearing loss.  He states that pain seems to come and go occasionally experiences radiating pain around the cheek and along the postauricular ear.   Patient denies blurry vision or pain at the temples.  No fever or chills at home.   Past Medical History:  Diagnosis Date  . ADHD (attention deficit hyperactivity disorder)   . Anxiety   . Benign prostatic hypertrophy   . Chronic lower back pain    surgery schedule for back 10/21/16  . Cyst of right kidney    incidental Bosiak 1.3cm on R (MRI abd 09/2014), follows with uro  . Depression   . GERD (gastroesophageal reflux disease)   . Glaucoma   . Hard of hearing   . History of migraine headaches    no meds  . Hyperlipidemia   . Hypertension   . Menetrier's disease      Immunizations up to date:  Yes.     Past Medical History:  Diagnosis Date  . ADHD (attention deficit hyperactivity disorder)   . Anxiety   . Benign prostatic hypertrophy   . Chronic lower back pain    surgery schedule for back 10/21/16  . Cyst of right kidney    incidental Bosiak 1.3cm on R (MRI abd 09/2014), follows with uro  . Depression   . GERD (gastroesophageal reflux disease)   . Glaucoma   . Hard of hearing   . History of migraine headaches    no meds  . Hyperlipidemia   . Hypertension   . Menetrier's disease     Patient Active Problem List   Diagnosis Date Noted  . Chronic eczematous otitis externa of both ears 01/11/2020  . Pure hypertriglyceridemia 11/30/2019  . Elevated serum  creatinine 09/14/2019  . Chronic midline low back pain with bilateral sciatica 03/13/2019  . CHF (congestive heart failure), NYHA class I, chronic, diastolic ([place]) 18/56/3149  . Cervical disc disorder at C5-C6 level with radiculopathy 11/10/2017  . B12 deficiency 08/26/2017  . Obesity (BMI 30.0-34.9) 08/10/2017  . Diastolic dysfunction without heart failure 07/28/2017  . Hypogonadism male 07/13/2017  . Erectile dysfunction due to arterial insufficiency 07/13/2017  . Nonspecific abnormal electrocardiogram (ECG) (EKG) 07/13/2017  . History of colonic polyps 09/16/2016  . MGUS (monoclonal gammopathy of unknown significance) 06/11/2016  . 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 Coarse tremors 04/09/2016  . Routine general medical examination at a health care facility 02/03/2016  . Restless legs syndrome with nocturnal myoclonus 10/02/2015  . Acid reflux 05/07/2015  . Kidney cysts 05/07/2015  . Type II diabetes mellitus with manifestations ([place]) 11/14/2014  . OSTEOPENIA 09/24/2009  . DUPUYTREN'S CONTRACTURE, RIGHT 09/06/2008  . DEGENERATION, LUMBAR/LUMBOSACRAL DISC 07/15/2007  . Depression, controlled 06/14/2007  . Attention deficit disorder 06/14/2007  . Hyperlipidemia with target LDL less than 130 05/13/2007  . Essential hypertension 05/13/2007  . BPH associated with nocturia 05/13/2007    Past Surgical History:  Procedure Laterality Date  . COLONOSCOPY  09/2007   polyps/ Deatra Ina  . HYDROCELE EXCISION Right  1990  . LUMBAR DISC SURGERY  10/21/2016  . ORCHIECTOMY Left 1970  . WISDOM TOOTH EXTRACTION      Prior to Admission medications   Medication Sig Start Date End Date Taking? Authorizing Provider  Acetaminophen 500 MG coapsule Take 500 mg by mouth Nightly. 10/12/17   [provider]  alfuzosin (UROXATRAL) 10 MG 24 hr tablet TAKE 1 TABLET(10 MG) BY MOUTH DAILY WITH BREAKFAST 08/24/20   [name], MD   amoxicillin (AMOXIL) 875 MG tablet Take 1 tablet (875 mg total) by mouth 2 (two) times daily for 10 days. 08/26/20 09/05/20  Lannie Fields, PA-C  aspirin 81 MG tablet Take 1 tablet (81 mg total) by mouth daily. 08/09/18   [name], MD  Cholecalciferol (VITAMIN D) 2000 UNITS CAPS Take 1 capsule (2,000 Units total) by mouth 1 dose over 46 hours. 02/04/11   Ricard Dillon, MD  DULoxetine (CYMBALTA) 30 MG capsule TAKE 1 CAPSULE BY MOUTH EVERY DAY 12/19/19   [name], MD  esomeprazole (NEXIUM) 20 MG capsule Take 20 mg by mouth daily at 12 noon.    [provider]  finasteride (PROSCAR) 5 MG tablet TAKE 1 TABLET(5 MG) BY MOUTH DAILY 04/11/20   [name], MD  lamoTRIgine (LAMICTAL) 25 MG tablet Take 1 tablet (25 mg total) by mouth 2 (two) times daily. 08/21/20   [name], MD  latanoprost (XALATAN) 0.005 % ophthalmic solution Place 1 drop into both eyes at bedtime. 05/08/19   [provider]  methylphenidate (METADATE ER) 20 MG ER tablet Take 1 tablet (20 mg total) by mouth daily. 07/18/20   [name], MD  metoprolol tartrate (LOPRESSOR) 25 MG tablet Take 1.5 tablets (37.5 mg total) by mouth 2 (two) times daily. 06/05/20   Nahser, Wonda Cheng, MD  olmesartan (BENICAR) 40 MG tablet Take 1 tablet (40 mg total) by mouth daily. 02/25/20   [name], MD  rosuvastatin (CRESTOR) 20 MG tablet Take 1 tablet (20 mg total) by mouth daily. 05/01/20 07/30/20  Leanor Kail, PA  Testosterone 20.25 MG/ACT (1.62%) GEL APPLY 2 ACTUATIONS ONTO THE SKIN DAILY 05/23/18   [name], MD  timolol (TIMOPTIC) 0.5 % ophthalmic solution INT 1 GTT IN [facility] EYE QAM 06/29/18   [provider]  traMADol (ULTRAM) 50 MG tablet Take 1 tablet (50 mg total) by mouth every 6 (six) hours as needed for up to 3 days. 08/26/20 08/29/20  Lannie Fields, PA-C  vitamin B-12 (CYANOCOBALAMIN) 1000 MCG tablet Take 1,000 mcg by mouth daily.    [provider]    Allergies Patient has  no known allergies.  Family History  Problem Relation Age of Onset  . COPD Mother   . Heart disease Mother   . Breast cancer Mother 47  . Osteoarthritis Mother        s/p B TKR  . Depression Mother   . Macular degeneration Father   . Diabetes Father   . Cancer Father        Colon Cancer  . Breast cancer Maternal Grandmother   . Colon cancer Neg Hx   . Esophageal cancer Neg Hx   . Rectal cancer Neg Hx   . Stomach cancer Neg Hx     Social History Social History   Tobacco Use  . Smoking status: Never Smoker  . Smokeless tobacco: Never Used  Vaping Use  . Vaping Use: Never used  Substance Use Topics  . Alcohol  use: Yes    Comment: occ  . Drug use: No     Review of Systems  Constitutional: No fever/chills Eyes:  No discharge ENT: Patient has right-sided ear pain. Respiratory: no cough. No SOB/ use of accessory muscles to breath Gastrointestinal:   No nausea, no vomiting.  No diarrhea.  No constipation. Musculoskeletal: Negative for musculoskeletal pain. Skin: Negative for rash, abrasions, lacerations, ecchymosis.    ____________________________________________   PHYSICAL EXAM:  VITAL SIGNS: ED Triage Vitals [08/26/20 0939]  Enc Vitals Group     BP 112/75     Pulse Rate 70     Resp 16     Temp 97.8 F (36.6 C)     Temp Source Oral     SpO2 99 %     Weight 165 lb (74.8 kg)     Height 5\' 4"  (1.626 m)     Head Circumference      Peak Flow      Pain Score 4     Pain Loc      Pain Edu?      Excl. in [place]?      Constitutional: Alert and oriented. Well appearing and in no acute distress. Eyes: Conjunctivae are normal. PERRL. EOMI. Head: Atraumatic. ENT:      Ears: Right TM is bulging with purulent exudate behind TM.  Patient has scarring visualized between 1:00 and 4:00 consistent with ruptured TM in the past.      Nose: No congestion/rhinnorhea.      Mouth/Throat: Mucous membranes are moist.  Neck: No stridor.  No cervical spine tenderness to  palpation. Skin:  Skin is warm, dry and intact. No rash noted. Psychiatric: Mood and affect are normal for age. Speech and behavior are normal.   ____________________________________________   LABS (all labs ordered are listed, but only abnormal results are displayed)  Labs Reviewed - No data to display ____________________________________________  EKG   ____________________________________________  RADIOLOGY  No results found.  ____________________________________________    PROCEDURES  Procedure(s) performed:     Procedures     Medications - No data to display   ____________________________________________   INITIAL IMPRESSION / ASSESSMENT AND PLAN / ED COURSE  Pertinent labs & imaging results that were available during my care of the patient were reviewed by me and considered in my medical decision making (see chart for details).    Assessment and plan Otitis media 70 year old male presents to the emergency department with right-sided ear pain for the past 5 days.  Vital signs were reassuring at triage.  On physical exam, right TM was erythematous and bulging with evidence of purulent exudate behind TM.  Diagnosis is consistent with otitis media at this time.  We will treat with amoxicillin twice daily for the next 10 days.  Patient was prescribed a short course of tramadol for pain.  I also recommended Zyrtec to be taken once daily for the next 10 days to address possible eustachian tube dysfunction.  Return precautions were given to return with new or worsening symptoms.  All patient questions were answered.   ____________________________________________  FINAL CLINICAL IMPRESSION(S) / ED DIAGNOSES  Final diagnoses:  Acute suppurative otitis media of right ear without spontaneous rupture of tympanic membrane, recurrence not specified      NEW MEDICATIONS STARTED DURING THIS VISIT:  ED Discharge Orders         Ordered    amoxicillin (AMOXIL)  875 MG tablet  2 times daily  08/26/20 1042    traMADol (ULTRAM) 50 MG tablet  Every 6 hours PRN        08/26/20 1042              This chart was dictated using voice recognition software/Dragon. Despite best efforts to proofread, errors can occur which can change the meaning. Any change was purely unintentional.     Lannie Fields, PA-C 08/26/20 1052

## 2020-08-26 NOTE — ED Triage Notes (Signed)
Pt c/o 4/10 right sided ear painx5 days. Pt denies any other sx.

## 2020-09-06 ENCOUNTER — Other Ambulatory Visit: Payer: Self-pay | Admitting: Hematology and Oncology

## 2020-09-06 ENCOUNTER — Inpatient Hospital Stay: Payer: PPO | Attending: Hematology and Oncology

## 2020-09-06 ENCOUNTER — Other Ambulatory Visit: Payer: Self-pay

## 2020-09-06 DIAGNOSIS — Z7982 Long term (current) use of aspirin: Secondary | ICD-10-CM | POA: Insufficient documentation

## 2020-09-06 DIAGNOSIS — D472 Monoclonal gammopathy: Secondary | ICD-10-CM | POA: Insufficient documentation

## 2020-09-06 DIAGNOSIS — N183 Chronic kidney disease, stage 3 unspecified: Secondary | ICD-10-CM | POA: Insufficient documentation

## 2020-09-06 DIAGNOSIS — Z79899 Other long term (current) drug therapy: Secondary | ICD-10-CM | POA: Diagnosis not present

## 2020-09-06 DIAGNOSIS — R944 Abnormal results of kidney function studies: Secondary | ICD-10-CM | POA: Diagnosis not present

## 2020-09-06 DIAGNOSIS — D631 Anemia in chronic kidney disease: Secondary | ICD-10-CM | POA: Insufficient documentation

## 2020-09-06 LAB — CBC WITH DIFFERENTIAL/PLATELET
Abs Immature Granulocytes: 0.02 10*3/uL (ref 0.00–0.07)
Basophils Absolute: 0 10*3/uL (ref 0.0–0.1)
Basophils Relative: 0 %
Eosinophils Absolute: 0.2 10*3/uL (ref 0.0–0.5)
Eosinophils Relative: 2 %
HCT: 36.5 % — ABNORMAL LOW (ref 39.0–52.0)
Hemoglobin: 12.7 g/dL — ABNORMAL LOW (ref 13.0–17.0)
Immature Granulocytes: 0 %
Lymphocytes Relative: 33 %
Lymphs Abs: 2.3 10*3/uL (ref 0.7–4.0)
MCH: 31 pg (ref 26.0–34.0)
MCHC: 34.8 g/dL (ref 30.0–36.0)
MCV: 89 fL (ref 80.0–100.0)
Monocytes Absolute: 0.6 10*3/uL (ref 0.1–1.0)
Monocytes Relative: 9 %
Neutro Abs: 4 10*3/uL (ref 1.7–7.7)
Neutrophils Relative %: 56 %
Platelets: 298 10*3/uL (ref 150–400)
RBC: 4.1 MIL/uL — ABNORMAL LOW (ref 4.22–5.81)
RDW: 12.3 % (ref 11.5–15.5)
WBC: 7.1 10*3/uL (ref 4.0–10.5)
nRBC: 0 % (ref 0.0–0.2)

## 2020-09-06 LAB — COMPREHENSIVE METABOLIC PANEL
ALT: 27 U/L (ref 0–44)
AST: 30 U/L (ref 15–41)
Albumin: 3.9 g/dL (ref 3.5–5.0)
Alkaline Phosphatase: 51 U/L (ref 38–126)
Anion gap: 7 (ref 5–15)
BUN: 42 mg/dL — ABNORMAL HIGH (ref 8–23)
CO2: 27 mmol/L (ref 22–32)
Calcium: 9.1 mg/dL (ref 8.9–10.3)
Chloride: 103 mmol/L (ref 98–111)
Creatinine, Ser: 1.79 mg/dL — ABNORMAL HIGH (ref 0.61–1.24)
GFR calc Af Amer: 44 mL/min — ABNORMAL LOW (ref >60)
GFR calc non Af Amer: 38 mL/min — ABNORMAL LOW (ref >60)
Glucose, Bld: 119 mg/dL — ABNORMAL HIGH (ref 70–99)
Potassium: 3.7 mmol/L (ref 3.5–5.1)
Sodium: 137 mmol/L (ref 135–145)
Total Bilirubin: 0.6 mg/dL (ref 0.3–1.2)
Total Protein: 7.3 g/dL (ref 6.5–8.1)

## 2020-09-09 LAB — MULTIPLE MYELOMA PANEL, SERUM
Albumin SerPl Elph-Mcnc: 3.6 g/dL (ref 2.9–4.4)
Albumin/Glob SerPl: 1.2 (ref 0.7–1.7)
Alpha 1: 0.2 g/dL (ref 0.0–0.4)
Alpha2 Glob SerPl Elph-Mcnc: 0.8 g/dL (ref 0.4–1.0)
B-Globulin SerPl Elph-Mcnc: 0.9 g/dL (ref 0.7–1.3)
Gamma Glob SerPl Elph-Mcnc: 1.1 g/dL (ref 0.4–1.8)
Globulin, Total: 3.1 g/dL (ref 2.2–3.9)
IgA: 170 mg/dL (ref 61–437)
IgG (Immunoglobin G), Serum: 1055 mg/dL (ref 603–1613)
IgM (Immunoglobulin M), Srm: 113 mg/dL (ref 20–172)
M Protein SerPl Elph-Mcnc: 0.5 g/dL — ABNORMAL HIGH
Total Protein ELP: 6.7 g/dL (ref 6.0–8.5)

## 2020-09-09 LAB — KAPPA/LAMBDA LIGHT CHAINS
Kappa free light chain: 22 mg/L — ABNORMAL HIGH (ref 3.3–19.4)
Kappa, lambda light chain ratio: 1.4 (ref 0.26–1.65)
Lambda free light chains: 15.7 mg/L (ref 5.7–26.3)

## 2020-09-12 ENCOUNTER — Encounter: Payer: Self-pay | Admitting: Internal Medicine

## 2020-09-13 ENCOUNTER — Encounter: Payer: Self-pay | Admitting: Hematology and Oncology

## 2020-09-13 ENCOUNTER — Other Ambulatory Visit: Payer: Self-pay | Admitting: Internal Medicine

## 2020-09-13 ENCOUNTER — Inpatient Hospital Stay: Payer: PPO | Admitting: Hematology and Oncology

## 2020-09-13 ENCOUNTER — Other Ambulatory Visit: Payer: Self-pay

## 2020-09-13 VITALS — BP 132/79 | HR 67 | Temp 97.4°F | Resp 18 | Ht 64.0 in | Wt 165.2 lb

## 2020-09-13 DIAGNOSIS — N183 Chronic kidney disease, stage 3 unspecified: Secondary | ICD-10-CM

## 2020-09-13 DIAGNOSIS — D631 Anemia in chronic kidney disease: Secondary | ICD-10-CM

## 2020-09-13 DIAGNOSIS — D472 Monoclonal gammopathy: Secondary | ICD-10-CM | POA: Diagnosis not present

## 2020-09-13 DIAGNOSIS — I10 Essential (primary) hypertension: Secondary | ICD-10-CM

## 2020-09-13 MED ORDER — OLMESARTAN MEDOXOMIL 40 MG PO TABS: 40.0000 mg | ORAL_TABLET | Freq: Every day | ORAL | 0 refills | Status: DC

## 2020-09-13 NOTE — Assessment & Plan Note (Addendum)
He has intermittent elevated serum creatinine secondary to diuretic therapy I recommend increase oral fluid intake as tolerated I recommend close follow-up with his primary care doctor and blood pressure monitoring at home I told the patient this is not related to multiple myeloma

## 2020-09-13 NOTE — Progress Notes (Signed)
[place] OFFICE PROGRESS NOTE  Patient Care Team: [name], MD as PCP - General (Internal Medicine) Nahser, Wonda Cheng, MD as PCP - Cardiology (Cardiology) Inda Castle, MD (Inactive) (Gastroenterology) Camillo Flaming, OD (Optometry) Carolan Clines, MD (Inactive) as Consulting Physician (Urology) Rozetta Nunnery, MD (Otolaryngology)  ASSESSMENT & PLAN:  MGUS (monoclonal gammopathy of unknown significance) The patient is noted to have IgG lambda MGUS. According to his recent blood work, and there were nothing to suggest end organ damage. So far, he has no signs of disease progression I spent a lot of time educating the patient the natural history of MGUS I will see him once a year with repeat labs and examination  Chronic kidney disease, stage III (moderate) ([place]) He has intermittent elevated serum creatinine secondary to diuretic therapy I recommend increase oral fluid intake as tolerated I recommend close follow-up with his primary care doctor and blood pressure monitoring at home I told the patient this is not related to multiple myeloma  Anemia in chronic kidney disease This is likely anemia of chronic disease. The patient denies recent history of bleeding such as epistaxis, hematuria or hematochezia. He is asymptomatic from the anemia. We will observe for now.  He does not require transfusion now. I do not recommend any further work-up at this time.  This is not related to multiple myeloma.   Orders Placed This Encounter  Procedures  . Comprehensive metabolic panel    Standing Status:   Standing    Number of Occurrences:   33    Standing Expiration Date:   09/13/2021  . CBC with Differential/Platelet    Standing Status:   Standing    Number of Occurrences:   22    Standing Expiration Date:   09/13/2021  . Kappa/lambda light chains    Standing Status:   Standing    Number of Occurrences:   22    Standing Expiration Date:   09/13/2021  .  Multiple Myeloma Panel (SPEP&IFE w/QIG)    Standing Status:   Standing    Number of Occurrences:   22    Standing Expiration Date:   09/13/2021    All questions were answered. The patient knows to call the clinic with any problems, questions or concerns. The total time spent in the appointment was 20 minutes encounter with patients including review of chart and various tests results, discussions about plan of care and coordination of care plan   Heath Lark, MD 09/13/2020 12:51 PM  INTERVAL HISTORY: Please see below for problem oriented charting. He returns for further follow-up The patient is currently taking care of his elderly father on hospice He admits he might not be drinking enough water He had recent interruption of his diuretic therapy due to elevated serum creatinine He has not checked his blood pressure recently No recent infection, fever or chills No new bone pain  SUMMARY OF ONCOLOGIC HISTORY:  The patient was feeling unwell with sensation of dizziness. He was referred to neurologist for further evaluation. Incidentally, during his physical examination, he also noted history of reduced sensation in his feet and numbness at the end of toes.  The patient has history of chronic back pain and was treated with gabapentin for his back pain. His neurologist perform significant workup including blood work which showed IgG lambda MGUS. He is being referred here for further evaluation He denies history of abnormal bone pain or bone fracture. Patient denies history of recurrent infection or atypical  infections such as shingles of meningitis. Denies chills, night sweats, anorexia or abnormal weight loss. Repeat blood work and skeletal survey in September 2017 show no evidence of end organ damage  He was recommended observation He was also noted to have borderline B12 deficiency and was recommended oral vitamin B12 replacement therapy   REVIEW OF SYSTEMS:   Constitutional: Denies  fevers, chills or abnormal weight loss Eyes: Denies blurriness of vision Ears, nose, mouth, throat, and face: Denies mucositis or sore throat Respiratory: Denies cough, dyspnea or wheezes Cardiovascular: Denies palpitation, chest discomfort or lower extremity swelling Gastrointestinal:  Denies nausea, heartburn or change in bowel habits Skin: Denies abnormal skin rashes Lymphatics: Denies new lymphadenopathy or easy bruising Neurological:Denies numbness, tingling or new weaknesses Behavioral/Psych: Mood is stable, no new changes  All other systems were reviewed with the patient and are negative.  I have reviewed the past medical history, past surgical history, social history and family history with the patient and they are unchanged from previous note.  ALLERGIES:  has No Known Allergies.  MEDICATIONS:  Current Outpatient Medications  Medication Sig Dispense Refill  . Acetaminophen 500 MG coapsule Take 500 mg by mouth Nightly.    Marland Kitchen alfuzosin (UROXATRAL) 10 MG 24 hr tablet TAKE 1 TABLET(10 MG) BY MOUTH DAILY WITH BREAKFAST 90 tablet 1  . aspirin 81 MG tablet Take 1 tablet (81 mg total) by mouth daily. 90 tablet 1  . cetirizine (ZYRTEC ALLERGY) 10 MG tablet Take 1 tablet (10 mg total) by mouth daily for 10 days. 30 tablet 0  . Cholecalciferol (VITAMIN D) 2000 UNITS CAPS Take 1 capsule (2,000 Units total) by mouth 1 dose over 46 hours. 30 capsule 11  . DULoxetine (CYMBALTA) 30 MG capsule TAKE 1 CAPSULE BY MOUTH EVERY DAY 90 capsule 1  . esomeprazole (NEXIUM) 20 MG capsule Take 20 mg by mouth daily at 12 noon.    . finasteride (PROSCAR) 5 MG tablet TAKE 1 TABLET(5 MG) BY MOUTH DAILY 90 tablet 1  . lamoTRIgine (LAMICTAL) 25 MG tablet Take 1 tablet (25 mg total) by mouth 2 (two) times daily. 180 tablet 0  . latanoprost (XALATAN) 0.005 % ophthalmic solution Place 1 drop into both eyes at bedtime.    . methylphenidate (METADATE ER) 20 MG ER tablet Take 1 tablet (20 mg total) by mouth daily. 90  tablet 0  . metoprolol tartrate (LOPRESSOR) 25 MG tablet Take 1.5 tablets (37.5 mg total) by mouth 2 (two) times daily. 270 tablet 3  . olmesartan (BENICAR) 40 MG tablet Take 1 tablet (40 mg total) by mouth daily. 90 tablet 0  . rosuvastatin (CRESTOR) 20 MG tablet Take 1 tablet (20 mg total) by mouth daily. 90 tablet 3  . Testosterone 20.25 MG/ACT (1.62%) GEL APPLY 2 ACTUATIONS ONTO THE SKIN DAILY 75 g 1  . timolol (TIMOPTIC) 0.5 % ophthalmic solution INT 1 GTT IN EACH EYE QAM  0  . vitamin B-12 (CYANOCOBALAMIN) 1000 MCG tablet Take 1,000 mcg by mouth daily.     No current facility-administered medications for this visit.    PHYSICAL EXAMINATION: ECOG PERFORMANCE STATUS: 1 - Symptomatic but completely ambulatory  Vitals:   09/13/20 1111  BP: 132/79  Pulse: 67  Resp: 18  Temp: (!) 97.4 F (36.3 C)  SpO2: 100%   Filed Weights   09/13/20 1111  Weight: 165 lb 3.2 oz (74.9 kg)    GENERAL:alert, no distress and comfortable NEURO: alert & oriented x 3 with fluent speech, no focal motor/sensory  deficits  LABORATORY DATA:  I have reviewed the data as listed    Component Value Date/Time   NA 137 09/06/2020 1050   NA 132 (L) 06/18/2020 0818   NA 140 08/18/2016 0953   K 3.7 09/06/2020 1050   K 3.5 08/18/2016 0953   CL 103 09/06/2020 1050   CO2 27 09/06/2020 1050   CO2 26 08/18/2016 0953   GLUCOSE 119 (H) 09/06/2020 1050   GLUCOSE 116 08/18/2016 0953   BUN 42 (H) 09/06/2020 1050   BUN 25 06/18/2020 0818   BUN 17.2 08/18/2016 0953   CREATININE 1.79 (H) 09/06/2020 1050   CREATININE 1.1 08/18/2016 0953   CALCIUM 9.1 09/06/2020 1050   CALCIUM 9.2 08/18/2016 0953   PROT 7.3 09/06/2020 1050   PROT 6.9 05/27/2020 1239   PROT 7.6 08/18/2016 0953   ALBUMIN 3.9 09/06/2020 1050   ALBUMIN 4.4 05/27/2020 1239   ALBUMIN 3.9 08/18/2016 0953   AST 30 09/06/2020 1050   AST 21 08/18/2016 0953   ALT 27 09/06/2020 1050   ALT 24 08/18/2016 0953   ALKPHOS 51 09/06/2020 1050   ALKPHOS 59  08/18/2016 0953   BILITOT 0.6 09/06/2020 1050   BILITOT 0.4 05/27/2020 1239   BILITOT 0.95 08/18/2016 0953   GFRNONAA 38 (L) 09/06/2020 1050   GFRAA 44 (L) 09/06/2020 1050    No results found for: SPEP, UPEP  Lab Results  Component Value Date   WBC 7.1 09/06/2020   NEUTROABS 4.0 09/06/2020   HGB 12.7 (L) 09/06/2020   HCT 36.5 (L) 09/06/2020   MCV 89.0 09/06/2020   PLT 298 09/06/2020      Chemistry      Component Value Date/Time   NA 137 09/06/2020 1050   NA 132 (L) 06/18/2020 0818   NA 140 08/18/2016 0953   K 3.7 09/06/2020 1050   K 3.5 08/18/2016 0953   CL 103 09/06/2020 1050   CO2 27 09/06/2020 1050   CO2 26 08/18/2016 0953   BUN 42 (H) 09/06/2020 1050   BUN 25 06/18/2020 0818   BUN 17.2 08/18/2016 0953   CREATININE 1.79 (H) 09/06/2020 1050   CREATININE 1.1 08/18/2016 0953   GLU 102 08/02/2015 0000      Component Value Date/Time   CALCIUM 9.1 09/06/2020 1050   CALCIUM 9.2 08/18/2016 0953   ALKPHOS 51 09/06/2020 1050   ALKPHOS 59 08/18/2016 0953   AST 30 09/06/2020 1050   AST 21 08/18/2016 0953   ALT 27 09/06/2020 1050   ALT 24 08/18/2016 0953   BILITOT 0.6 09/06/2020 1050   BILITOT 0.4 05/27/2020 1239   BILITOT 0.95 08/18/2016 4503

## 2020-09-13 NOTE — Assessment & Plan Note (Signed)
The patient is noted to have IgG lambda MGUS. According to his recent blood work, and there were nothing to suggest end organ damage. So far, he has no signs of disease progression I spent a lot of time educating the patient the natural history of MGUS I will see him once a year with repeat labs and examination

## 2020-09-13 NOTE — Assessment & Plan Note (Signed)
This is likely anemia of chronic disease. The patient denies recent history of bleeding such as epistaxis, hematuria or hematochezia. He is asymptomatic from the anemia. We will observe for now.  He does not require transfusion now. I do not recommend any further work-up at this time.  This is not related to multiple myeloma.

## 2020-10-03 ENCOUNTER — Encounter: Payer: Self-pay | Admitting: Internal Medicine

## 2020-10-03 DIAGNOSIS — M25521 Pain in right elbow: Secondary | ICD-10-CM | POA: Diagnosis not present

## 2020-10-03 DIAGNOSIS — S46291A Other injury of muscle, fascia and tendon of other parts of biceps, right arm, initial encounter: Secondary | ICD-10-CM | POA: Diagnosis not present

## 2020-10-05 DIAGNOSIS — M25521 Pain in right elbow: Secondary | ICD-10-CM | POA: Diagnosis not present

## 2020-10-07 ENCOUNTER — Other Ambulatory Visit: Payer: Self-pay | Admitting: Internal Medicine

## 2020-10-07 DIAGNOSIS — N401 Enlarged prostate with lower urinary tract symptoms: Secondary | ICD-10-CM

## 2020-10-07 DIAGNOSIS — R351 Nocturia: Secondary | ICD-10-CM

## 2020-10-08 DIAGNOSIS — S46291A Other injury of muscle, fascia and tendon of other parts of biceps, right arm, initial encounter: Secondary | ICD-10-CM | POA: Diagnosis not present

## 2020-10-10 ENCOUNTER — Telehealth: Payer: Self-pay | Admitting: Cardiovascular Disease

## 2020-10-10 DIAGNOSIS — H66011 Acute suppurative otitis media with spontaneous rupture of ear drum, right ear: Secondary | ICD-10-CM | POA: Diagnosis not present

## 2020-10-10 DIAGNOSIS — H90A31 Mixed conductive and sensorineural hearing loss, unilateral, right ear with restricted hearing on the contralateral side: Secondary | ICD-10-CM | POA: Diagnosis not present

## 2020-10-10 DIAGNOSIS — H6121 Impacted cerumen, right ear: Secondary | ICD-10-CM | POA: Diagnosis not present

## 2020-10-10 DIAGNOSIS — H90A22 Sensorineural hearing loss, unilateral, left ear, with restricted hearing on the contralateral side: Secondary | ICD-10-CM | POA: Diagnosis not present

## 2020-10-10 NOTE — Telephone Encounter (Signed)
   [facility] Pre-operative Risk Assessment    HEARTCARE STAFF: - Please ensure there is not already an duplicate clearance open for this procedure. - Under Visit Info/Reason for Call, type in Other and utilize the format Clearance MM/DD/YY or Clearance TBD. Do not use dashes or single digits. - If request is for dental extraction, please clarify the # of teeth to be extracted.  Request for surgical clearance:  1. What type of surgery is being performed? Right distal biceps tendon repair   2. When is this surgery scheduled?  10/14/20   3. What type of clearance is required (medical clearance vs. Pharmacy clearance to hold med vs. Both)? Medical  4. Are there any medications that need to be held prior to surgery and how long? None mentioned   5. Practice name and name of physician performing surgery?  [facility]- Dr. Iran Planas   6. What is the office phone number? [phone]   7.   What is the office fax number? [place]  8.   Anesthesia type (None, local, MAC, general) ? Regional Block with IV sedation  _________________________________________________________________   (provider comments below)

## 2020-10-11 NOTE — Telephone Encounter (Signed)
Left message to call back and ask to speak with pre-op team. 

## 2020-10-11 NOTE — Telephone Encounter (Signed)
° °  Primary Cardiologist: Mertie Moores, MD  Chart reviewed as part of pre-operative protocol coverage. Patient was last seen by Dr. Acie Fredrickson in 05/2020 at which time he was doing well from a cardiac standpoint. Patient was contacted today for additional pre-op evaluation. He reports doing well since last office visit. No chest pain, shortness of breath, orthopnea, PND, palpitations, lightheadedness, dizziness, syncope. Activity limited by back pain but able to complete >4.0 METS. Given past medical history and time since last visit, based on ACC/AHA guidelines, Robert Mccall would be at acceptable risk for the planned procedure without further cardiovascular testing.   I will route this recommendation to the requesting party via Epic fax function and remove from pre-op pool.  Please call with questions.  Robert Mclean, PA-C 10/11/2020, 9:44 AM

## 2020-10-11 NOTE — Telephone Encounter (Signed)
Pt returning call to Pre-op team

## 2020-10-14 DIAGNOSIS — G8918 Other acute postprocedural pain: Secondary | ICD-10-CM | POA: Diagnosis not present

## 2020-10-14 DIAGNOSIS — X58XXXA Exposure to other specified factors, initial encounter: Secondary | ICD-10-CM | POA: Diagnosis not present

## 2020-10-14 DIAGNOSIS — Y999 Unspecified external cause status: Secondary | ICD-10-CM | POA: Diagnosis not present

## 2020-10-14 DIAGNOSIS — S46291A Other injury of muscle, fascia and tendon of other parts of biceps, right arm, initial encounter: Secondary | ICD-10-CM | POA: Diagnosis not present

## 2020-10-22 ENCOUNTER — Encounter: Payer: Self-pay | Admitting: Internal Medicine

## 2020-10-23 ENCOUNTER — Other Ambulatory Visit: Payer: Self-pay | Admitting: Internal Medicine

## 2020-10-23 DIAGNOSIS — F900 Attention-deficit hyperactivity disorder, predominantly inattentive type: Secondary | ICD-10-CM

## 2020-10-23 MED ORDER — METHYLPHENIDATE HCL ER 20 MG PO TBCR: 20.0000 mg | EXTENDED_RELEASE_TABLET | Freq: Every day | ORAL | 0 refills | Status: DC

## 2020-10-25 DIAGNOSIS — S46291A Other injury of muscle, fascia and tendon of other parts of biceps, right arm, initial encounter: Secondary | ICD-10-CM | POA: Diagnosis not present

## 2020-10-25 DIAGNOSIS — Z4789 Encounter for other orthopedic aftercare: Secondary | ICD-10-CM | POA: Diagnosis not present

## 2020-10-25 DIAGNOSIS — M25621 Stiffness of right elbow, not elsewhere classified: Secondary | ICD-10-CM | POA: Diagnosis not present

## 2020-10-30 ENCOUNTER — Encounter: Payer: Self-pay | Admitting: Internal Medicine

## 2020-10-31 ENCOUNTER — Other Ambulatory Visit: Payer: Self-pay | Admitting: Internal Medicine

## 2020-10-31 DIAGNOSIS — F900 Attention-deficit hyperactivity disorder, predominantly inattentive type: Secondary | ICD-10-CM

## 2020-10-31 MED ORDER — METHYLPHENIDATE HCL ER 20 MG PO TBCR: 20.0000 mg | EXTENDED_RELEASE_TABLET | Freq: Every day | ORAL | 0 refills | Status: DC

## 2020-11-07 DIAGNOSIS — M25621 Stiffness of right elbow, not elsewhere classified: Secondary | ICD-10-CM | POA: Diagnosis not present

## 2020-11-14 DIAGNOSIS — M25521 Pain in right elbow: Secondary | ICD-10-CM | POA: Diagnosis not present

## 2020-11-17 ENCOUNTER — Encounter: Payer: Self-pay | Admitting: Cardiovascular Disease

## 2020-11-17 NOTE — Progress Notes (Signed)
Cardiology Office Note:    Date:  11/18/2020   ID:  Robert Mccall, DOB 1951-01-26, MRN [id number]  PCP:  [name], MD  Cardiologist:  Mertie Moores, MD   Referring MD: [name], MD   Problem List 1. TIA 2. Chronic diastolic CHF 3. HTN 4. Hyperlipidemia    Chief Complaint  Patient presents with  . Congestive Heart Failure    April 04, 2018:     Robert Mccall is a 68 y.o. male with a hx of hypertension, hyperlipidemia,  Chronic diastolic CHF  Resents for further evaluation of chest discomfort.  Tracks his BP regularly ,  BP cuff will alert him if his heart rate is irregular.  He can feel these palpitations.  They occur spontaneously and might last for as long as 15 to 20 minutes.  They typically resolve spontaneously.  These are not associated with any specific activity.  They are not associated with any particular time a day.  Does not do any regular exercise .   Works in [employer].   Oct. 23, 2019:  Has been having trouble with his hearing that she has Mnire's disease.  He denies any episodes of chest pain or shortness of breath.  Blood pressure and heart rate are well controlled today.  Lipids from last week look good on Crestor 10 a day   Nov. 2, 2020 Robert Mccall is seen today for follow up of his HTN and chronic diastolic CHF No cp, no dyspnea.   Not exercising  Much.   Works at [employer].   May 27, 2020: Robert Mccall is seen today for follow-up for his hypertension and chronic diastolic congestive heart failure. No CP or dyspnea.   He is very hard of hearing  -    We ordered a coronary CT angiogram.  Coronary calcium score of 523. This was 76th percentile for age and sex matched control. Coronary angiogram revealed moderate disease in the proximal/mid LAD. There was concern for possible obstruction just distal to the first diagonal at the level of the second diagonal.  There is no disease in the LAD distal to the  second diagonal.  FFR did not show any significant obstructive disease.   We discussed the importance of lipid management.  His most recent labs are from December, 2020.  His LDL is 76.  The triglyceride level is 299.  The total cholesterol is 157.  The HDL is 35.7.  No angina .   Has some CP at night when he is lying down.    Is not getting regular exercise - lots of back issues.    Dec. 13, 2021: Robert Mccall is seen today for follow up of his moderate CAD, chronic diastolic CHF Has coronary calcium score is 523 Had a random CP one night.  Likely was indigestion     Past Medical History:  Diagnosis Date  . ADHD (attention deficit hyperactivity disorder)   . Anxiety   . Benign prostatic hypertrophy   . Chronic lower back pain    surgery schedule for back 10/21/16  . Cyst of right kidney    incidental [place] 1.3cm on R (MRI abd 09/2014), follows with uro  . Depression   . GERD (gastroesophageal reflux disease)   . Glaucoma   . Hard of hearing   . History of migraine headaches    no meds  . Hyperlipidemia   . Hypertension   . Menetrier's disease     Past Surgical History:  Procedure Laterality Date  . COLONOSCOPY  09/2007   polyps/ Deatra Ina  . HYDROCELE EXCISION Right 1990  . LUMBAR DISC SURGERY  10/21/2016  . ORCHIECTOMY Left 1970  . WISDOM TOOTH EXTRACTION      Current Medications: Current Meds  Medication Sig  . Acetaminophen 500 MG coapsule Take 500 mg by mouth Nightly.  Marland Kitchen alfuzosin (UROXATRAL) 10 MG 24 hr tablet TAKE 1 TABLET(10 MG) BY MOUTH DAILY WITH BREAKFAST  . aspirin 81 MG tablet Take 1 tablet (81 mg total) by mouth daily.  . Cholecalciferol (VITAMIN D) 2000 UNITS CAPS Take 1 capsule (2,000 Units total) by mouth 1 dose over 46 hours.  . DULoxetine (CYMBALTA) 30 MG capsule TAKE 1 CAPSULE BY MOUTH EVERY DAY  . esomeprazole (NEXIUM) 20 MG capsule Take 20 mg by mouth daily at 12 noon.  . finasteride (PROSCAR) 5 MG tablet TAKE 1 TABLET(5 MG) BY MOUTH DAILY  .  hydrochlorothiazide (HYDRODIURIL) 25 MG tablet Take 1 tablet by mouth daily in the afternoon.  . lamoTRIgine (LAMICTAL) 25 MG tablet Take 1 tablet (25 mg total) by mouth 2 (two) times daily.  Marland Kitchen latanoprost (XALATAN) 0.005 % ophthalmic solution Place 1 drop into both eyes at bedtime.  . methylphenidate (METADATE ER) 20 MG ER tablet Take 1 tablet (20 mg total) by mouth daily.  . metoprolol tartrate (LOPRESSOR) 25 MG tablet Take 1.5 tablets (37.5 mg total) by mouth 2 (two) times daily.  Marland Kitchen olmesartan (BENICAR) 40 MG tablet Take 1 tablet (40 mg total) by mouth daily.  . rosuvastatin (CRESTOR) 20 MG tablet Take 1 tablet (20 mg total) by mouth daily.  . Testosterone 20.25 MG/ACT (1.62%) GEL APPLY 2 ACTUATIONS ONTO THE SKIN DAILY  . timolol (TIMOPTIC) 0.5 % ophthalmic solution INT 1 GTT IN EACH EYE QAM  . vitamin B-12 (CYANOCOBALAMIN) 1000 MCG tablet Take 1,000 mcg by mouth daily.  . [DISCONTINUED] cetirizine (ZYRTEC ALLERGY) 10 MG tablet Take 1 tablet (10 mg total) by mouth daily for 10 days.     Allergies:   Patient has no known allergies.   Social History   Socioeconomic History  . Marital status: Single    Spouse name: Not on file  . Number of children: Not on file  . Years of education: Not on file  . Highest education level: Not on file  Occupational History  . Occupation: Freight forwarder    Comment: [employer]  Tobacco Use  . Smoking status: Never Smoker  . Smokeless tobacco: Never Used  Vaping Use  . Vaping Use: Never used  Substance and Sexual Activity  . Alcohol use: Yes    Comment: occ  . Drug use: No  . Sexual activity: Yes    Comment: working with replacement  Other Topics Concern  . Not on file  Social History Narrative  . Not on file   Social Determinants of Health   Financial Resource Strain: Not on file  Food Insecurity: Not on file  Transportation Needs: Not on file  Physical Activity: Not on file  Stress: Not on file  Social Connections: Not on  file     Family History: The patient's family history includes Breast cancer in his maternal grandmother; Breast cancer (age of onset: 25) in his mother; COPD in his mother; Cancer in his father; Depression in his mother; Diabetes in his father; Heart disease in his mother; Macular degeneration in his father; Osteoarthritis in his mother. There is no history of Colon cancer, Esophageal cancer, Rectal cancer, or  Stomach cancer.  ROS:   Please see the history of present illness.     All other systems reviewed and are negative.  EKGs/Labs/Other Studies Reviewed:    The following studies were reviewed today:    Recent Labs: 11/29/2019: TSH 2.15 04/15/2020: NT-Pro BNP 22 09/06/2020: ALT 27; BUN 42; Creatinine, Ser 1.79; Hemoglobin 12.7; Platelets 298; Potassium 3.7; Sodium 137  Recent Lipid Panel    Component Value Date/Time   CHOL 107 05/27/2020 1239   TRIG 133 05/27/2020 1239   HDL 31 (L) 05/27/2020 1239   CHOLHDL 3.5 05/27/2020 1239   CHOLHDL 4 11/29/2019 0901   VLDL 59.8 (H) 11/29/2019 0901   LDLCALC 52 05/27/2020 1239   LDLDIRECT 76.0 11/29/2019 0901   Physical Exam: Blood pressure 96/68, pulse 78, height 5\' 4"  (1.626 m), weight 156 lb (70.8 kg), SpO2 96 %.  GEN:  Well nourished, well developed in no acute distress HEENT: Normal NECK: No JVD; No carotid bruits LYMPHATICS: No lymphadenopathy CARDIAC: RRR , no murmurs, rubs, gallops RESPIRATORY:  Clear to auscultation without rales, wheezing or rhonchi  ABDOMEN: Soft, non-tender, non-distended MUSCULOSKELETAL:  No edema; No deformity  SKIN: Warm and dry NEUROLOGIC:  Alert and oriented x 3   EKG:       ASSESSMENT:    1. CHF (congestive heart failure), NYHA class I, chronic, diastolic ([place])   2. Essential hypertension    PLAN:     1. Hypertension-    BP is actually on the low side today . No symptoms of dizziness. He will let us know if he has any symptoms of orthostasis. hes not HCTZ partly for meniers disease.    We may need to lower the dose of the olmasartan if his BP remains low      3.  Hyperlipidemia :    Lipids look great   Cont current meds.    4.   Chronic diastolic CHF:    Well controlled.        Medication Adjustments/Labs and Tests Ordered: Current medicines are reviewed at length with the patient today.  Concerns regarding medicines are outlined above.  No orders of the defined types were placed in this encounter.  No orders of the defined types were placed in this encounter.   Patient Instructions  Medication Instructions:  Your physician recommends that you continue on your current medications as directed. Please refer to the Current Medication list given to you today.  *If you need a refill on your cardiac medications before your next appointment, please call your pharmacy*   Lab Work: None Ordered If you have labs (blood work) drawn today and your tests are completely normal, you will receive your results only by: Marland Kitchen MyChart Message (if you have MyChart) OR . A paper copy in the mail If you have any lab test that is abnormal or we need to change your treatment, we will call you to review the results.   Testing/Procedures: None Ordered    Follow-Up: At [facility], you and your health needs are our priority.  As part of our continuing mission to provide you with exceptional heart care, we have created designated Provider Care Teams.  These Care Teams include your primary Cardiologist (physician) and Advanced Practice Providers (APPs -  Physician Assistants and Nurse Practitioners) who all work together to provide you with the care you need, when you need it.    Your next appointment:   6 month(s)  The format for your next appointment:  In Person  Provider:   You will see one of the following Advanced Practice Providers on your designated Care Team:    Richardson Dopp, PA-C  Robbie Lis, [place]       Signed, Mertie Moores, MD  11/18/2020 5:45 PM     [place]

## 2020-11-18 ENCOUNTER — Ambulatory Visit: Payer: PPO | Admitting: Cardiovascular Disease

## 2020-11-18 ENCOUNTER — Other Ambulatory Visit: Payer: Self-pay

## 2020-11-18 ENCOUNTER — Encounter: Payer: Self-pay | Admitting: Cardiovascular Disease

## 2020-11-18 VITALS — BP 96/68 | HR 78 | Ht 64.0 in | Wt 156.0 lb

## 2020-11-18 DIAGNOSIS — I5032 Chronic diastolic (congestive) heart failure: Secondary | ICD-10-CM

## 2020-11-18 DIAGNOSIS — I10 Essential (primary) hypertension: Secondary | ICD-10-CM

## 2020-11-18 NOTE — Patient Instructions (Signed)
Medication Instructions:  Your physician recommends that you continue on your current medications as directed. Please refer to the Current Medication list given to you today.  *If you need a refill on your cardiac medications before your next appointment, please call your pharmacy*   Lab Work: None Ordered If you have labs (blood work) drawn today and your tests are completely normal, you will receive your results only by: MyChart Message (if you have MyChart) OR A paper copy in the mail If you have any lab test that is abnormal or we need to change your treatment, we will call you to review the results.   Testing/Procedures: None Ordered   Follow-Up: At [facility], you and your health needs are our priority.  As part of our continuing mission to provide you with exceptional heart care, we have created designated Provider Care Teams.  These Care Teams include your primary Cardiologist (physician) and Advanced Practice Providers (APPs -  Physician Assistants and Nurse Practitioners) who all work together to provide you with the care you need, when you need it.   Your next appointment:   6 month(s)  The format for your next appointment:   In Person  Provider:   You will see one of the following Advanced Practice Providers on your designated Care Team:   Scott Weaver, PA-C Vin Bhagat, PA-C  

## 2020-11-19 ENCOUNTER — Telehealth: Payer: Self-pay | Admitting: Internal Medicine

## 2020-11-19 NOTE — Progress Notes (Signed)
°  Chronic Care Management   Note  11/19/2020 Name: Mansour Balboa MRN: [id number] DOB: [birth year]  Andrej Spagnoli is a 69 y.o. year old male who is a primary care patient of [name], MD. I reached out to [name] by phone today in response to a referral sent by Mr. Minoru Chap [place]'s PCP, [name], MD.   Mr. Swor was given information about Chronic Care Management services today including:  1. CCM service includes personalized support from designated clinical staff supervised by his physician, including individualized plan of care and coordination with other care providers 2. 24/7 contact phone numbers for assistance for urgent and routine care needs. 3. Service will only be billed when office clinical staff spend 20 minutes or more in a month to coordinate care. 4. Only one practitioner may furnish and bill the service in a calendar month. 5. The patient may stop CCM services at any time (effective at the end of the month) by phone call to the office staff.   Patient agreed to services and verbal consent obtained.   Follow up plan:   Carley Perdue [employer] Scheduler

## 2020-11-22 DIAGNOSIS — M25521 Pain in right elbow: Secondary | ICD-10-CM | POA: Diagnosis not present

## 2020-11-23 ENCOUNTER — Other Ambulatory Visit: Payer: Self-pay | Admitting: Internal Medicine

## 2020-11-23 DIAGNOSIS — F331 Major depressive disorder, recurrent, moderate: Secondary | ICD-10-CM

## 2020-11-28 DIAGNOSIS — M25521 Pain in right elbow: Secondary | ICD-10-CM | POA: Diagnosis not present

## 2020-11-30 ENCOUNTER — Other Ambulatory Visit: Payer: Self-pay | Admitting: Internal Medicine

## 2020-11-30 DIAGNOSIS — I10 Essential (primary) hypertension: Secondary | ICD-10-CM

## 2020-12-05 DIAGNOSIS — M25621 Stiffness of right elbow, not elsewhere classified: Secondary | ICD-10-CM | POA: Diagnosis not present

## 2020-12-13 DIAGNOSIS — M25521 Pain in right elbow: Secondary | ICD-10-CM | POA: Diagnosis not present

## 2020-12-19 DIAGNOSIS — H90A31 Mixed conductive and sensorineural hearing loss, unilateral, right ear with restricted hearing on the contralateral side: Secondary | ICD-10-CM | POA: Diagnosis not present

## 2020-12-19 DIAGNOSIS — Z974 Presence of external hearing-aid: Secondary | ICD-10-CM | POA: Diagnosis not present

## 2020-12-19 DIAGNOSIS — H90A22 Sensorineural hearing loss, unilateral, left ear, with restricted hearing on the contralateral side: Secondary | ICD-10-CM | POA: Diagnosis not present

## 2020-12-19 DIAGNOSIS — B369 Superficial mycosis, unspecified: Secondary | ICD-10-CM | POA: Diagnosis not present

## 2020-12-19 DIAGNOSIS — H938X2 Other specified disorders of left ear: Secondary | ICD-10-CM | POA: Diagnosis not present

## 2020-12-19 DIAGNOSIS — H6241 Otitis externa in other diseases classified elsewhere, right ear: Secondary | ICD-10-CM | POA: Diagnosis not present

## 2020-12-20 DIAGNOSIS — M25521 Pain in right elbow: Secondary | ICD-10-CM | POA: Diagnosis not present

## 2020-12-26 DIAGNOSIS — H6240 Otitis externa in other diseases classified elsewhere, unspecified ear: Secondary | ICD-10-CM | POA: Diagnosis not present

## 2020-12-26 DIAGNOSIS — H90A31 Mixed conductive and sensorineural hearing loss, unilateral, right ear with restricted hearing on the contralateral side: Secondary | ICD-10-CM | POA: Diagnosis not present

## 2020-12-26 DIAGNOSIS — B369 Superficial mycosis, unspecified: Secondary | ICD-10-CM | POA: Diagnosis not present

## 2020-12-27 DIAGNOSIS — M25621 Stiffness of right elbow, not elsewhere classified: Secondary | ICD-10-CM | POA: Diagnosis not present

## 2021-01-02 DIAGNOSIS — H524 Presbyopia: Secondary | ICD-10-CM | POA: Diagnosis not present

## 2021-01-02 DIAGNOSIS — H35372 Puckering of macula, left eye: Secondary | ICD-10-CM | POA: Diagnosis not present

## 2021-01-02 DIAGNOSIS — H401211 Low-tension glaucoma, right eye, mild stage: Secondary | ICD-10-CM | POA: Diagnosis not present

## 2021-01-02 DIAGNOSIS — H401222 Low-tension glaucoma, left eye, moderate stage: Secondary | ICD-10-CM | POA: Diagnosis not present

## 2021-01-02 DIAGNOSIS — H35362 Drusen (degenerative) of macula, left eye: Secondary | ICD-10-CM | POA: Diagnosis not present

## 2021-01-08 ENCOUNTER — Telehealth: Payer: Self-pay | Admitting: Pharmacist

## 2021-01-08 NOTE — Progress Notes (Signed)
Chronic Care Management Pharmacy Assistant   Name: Robert Mccall  MRN: [id number] DOB: 09-Dec-1950  Reason for Encounter: Initial Questions   PCP : [name], MD  Allergies:  No Known Allergies  Medications: Outpatient Encounter Medications as of 01/08/2021  Medication Sig   Acetaminophen 500 MG coapsule Take 500 mg by mouth Nightly.   alfuzosin (UROXATRAL) 10 MG 24 hr tablet TAKE 1 TABLET(10 MG) BY MOUTH DAILY WITH BREAKFAST   aspirin 81 MG tablet Take 1 tablet (81 mg total) by mouth daily.   Cholecalciferol (VITAMIN D) 2000 UNITS CAPS Take 1 capsule (2,000 Units total) by mouth 1 dose over 46 hours.   DULoxetine (CYMBALTA) 30 MG capsule TAKE 1 CAPSULE BY MOUTH EVERY DAY   esomeprazole (NEXIUM) 20 MG capsule Take 20 mg by mouth daily at 12 noon.   finasteride (PROSCAR) 5 MG tablet TAKE 1 TABLET(5 MG) BY MOUTH DAILY   hydrochlorothiazide (HYDRODIURIL) 25 MG tablet TAKE 1 TABLET(25 MG) BY MOUTH DAILY   lamoTRIgine (LAMICTAL) 25 MG tablet TAKE 1 TABLET(25 MG) BY MOUTH TWICE DAILY   latanoprost (XALATAN) 0.005 % ophthalmic solution Place 1 drop into both eyes at bedtime.   methylphenidate (METADATE ER) 20 MG ER tablet Take 1 tablet (20 mg total) by mouth daily.   metoprolol tartrate (LOPRESSOR) 25 MG tablet Take 1.5 tablets (37.5 mg total) by mouth 2 (two) times daily.   olmesartan (BENICAR) 40 MG tablet TAKE 1 TABLET(40 MG) BY MOUTH DAILY   rosuvastatin (CRESTOR) 20 MG tablet Take 1 tablet (20 mg total) by mouth daily.   Testosterone 20.25 MG/ACT (1.62%) GEL APPLY 2 ACTUATIONS ONTO THE SKIN DAILY   timolol (TIMOPTIC) 0.5 % ophthalmic solution INT 1 GTT IN EACH EYE QAM   vitamin B-12 (CYANOCOBALAMIN) 1000 MCG tablet Take 1,000 mcg by mouth daily.   No facility-administered encounter medications on file as of 01/08/2021.    Current Diagnosis: Patient Active Problem List   Diagnosis Date Noted   Chronic kidney disease, stage III (moderate) ([place])  09/13/2020   Anemia in chronic kidney disease 09/13/2020   Chronic eczematous otitis externa of both ears 01/11/2020   Pure hypertriglyceridemia 11/30/2019   Elevated serum creatinine 09/14/2019   Chronic midline low back pain with bilateral sciatica 03/13/2019   CHF (congestive heart failure), NYHA class I, chronic, diastolic ([place]) 62/13/0865   Cervical disc disorder at C5-C6 level with radiculopathy 11/10/2017   B12 deficiency 08/26/2017   Obesity (BMI 30.0-34.9) 78/46/9629   Diastolic dysfunction without heart failure 07/28/2017   Hypogonadism male 07/13/2017   Erectile dysfunction due to arterial insufficiency 07/13/2017   Nonspecific abnormal electrocardiogram (ECG) (EKG) 07/13/2017   History of colonic polyps 09/16/2016   MGUS (monoclonal gammopathy of unknown significance) 06/11/2016   Neuropathy associated with monoclonal gammopathy of undetermined significance ([place]) 06/11/2016   Coarse tremors 04/09/2016   Routine general medical examination at a health care facility 02/03/2016   Restless legs syndrome with nocturnal myoclonus 10/02/2015   Acid reflux 05/07/2015   Kidney cysts 05/07/2015   Type II diabetes mellitus with manifestations ([place]) 11/14/2014   OSTEOPENIA 09/24/2009   DUPUYTREN'S CONTRACTURE, RIGHT 09/06/2008   DEGENERATION, LUMBAR/LUMBOSACRAL DISC 07/15/2007   Depression, controlled 06/14/2007   Attention deficit disorder 06/14/2007   Hyperlipidemia with target LDL less than 130 05/13/2007   Essential hypertension 05/13/2007   BPH associated with nocturia 05/13/2007    Goals Addressed   None     Follow-Up:  Pharmacist Review   Have you seen  any other providers since your last visit? The patient states that he saw Dr. Iran Planas, he had surgery on his arm  Any changes in your medications or health? The patient states that he has not had any changes to his medication or health  Any side effects from any medications? The  patient states that he has not had any side effects from any of his medications  Do you have an symptoms or problems not managed by your medications? The patient states that he has Meniere's disease but he does not believe anything can be done about that  Any concerns about your health right now? The patient states that his concern is about his kidneys getting his creatinine levels down  Has your provider asked that you check blood pressure, blood sugar, or follow special diet at home? The patient states that he should be checking his blood pressure at home but really has not done it much, and he also states that he is trying to stay under 2000 calories and watch his salt intake  Do you get any type of exercise on a regular basis? The patient states that he is not doing any exercise  Can you think of a goal you would like to reach for your health? The patient states that he does not have a goal for his health right now  Do you have any problems getting your medications? The patient states that he does not have any problems with getting his medications from the pharmacy  Is there anything that you would like to discuss during the appointment? The patient states that he does not have anything specific to discuss at this time.  Please bring medications and supplements to appointment   Wendy Poet, [place] [phone]

## 2021-01-09 ENCOUNTER — Ambulatory Visit (INDEPENDENT_AMBULATORY_CARE_PROVIDER_SITE_OTHER): Payer: PPO | Admitting: Pharmacist

## 2021-01-09 ENCOUNTER — Other Ambulatory Visit: Payer: Self-pay

## 2021-01-09 DIAGNOSIS — F331 Major depressive disorder, recurrent, moderate: Secondary | ICD-10-CM | POA: Diagnosis not present

## 2021-01-09 DIAGNOSIS — I10 Essential (primary) hypertension: Secondary | ICD-10-CM | POA: Diagnosis not present

## 2021-01-09 DIAGNOSIS — E785 Hyperlipidemia, unspecified: Secondary | ICD-10-CM | POA: Diagnosis not present

## 2021-01-09 DIAGNOSIS — E118 Type 2 diabetes mellitus with unspecified complications: Secondary | ICD-10-CM | POA: Diagnosis not present

## 2021-01-09 DIAGNOSIS — R351 Nocturia: Secondary | ICD-10-CM

## 2021-01-09 DIAGNOSIS — D472 Monoclonal gammopathy: Secondary | ICD-10-CM

## 2021-01-09 DIAGNOSIS — N401 Enlarged prostate with lower urinary tract symptoms: Secondary | ICD-10-CM

## 2021-01-09 DIAGNOSIS — I5032 Chronic diastolic (congestive) heart failure: Secondary | ICD-10-CM

## 2021-01-09 DIAGNOSIS — F900 Attention-deficit hyperactivity disorder, predominantly inattentive type: Secondary | ICD-10-CM

## 2021-01-09 DIAGNOSIS — F32A Depression, unspecified: Secondary | ICD-10-CM

## 2021-01-09 DIAGNOSIS — G63 Polyneuropathy in diseases classified elsewhere: Secondary | ICD-10-CM

## 2021-01-09 NOTE — Progress Notes (Signed)
Chronic Care Management Pharmacy Note  01/11/2021 Name:  Robert Mccall MRN:  [id number] DOB:  1951/04/08  Subjective: Robert Mccall is an 70 y.o. year old male who is a primary patient of [name], MD.  The CCM team was consulted for assistance with disease management and care coordination needs.    Engaged with patient face to face for initial visit in response to provider referral for pharmacy case management and/or care coordination services.   Consent to Services:  The patient was given the following information about Chronic Care Management services today, agreed to services, and gave verbal consent: 1. CCM service includes personalized support from designated clinical staff supervised by the primary care provider, including individualized plan of care and coordination with other care providers 2. 24/7 contact phone numbers for assistance for urgent and routine care needs. 3. Service will only be billed when office clinical staff spend 20 minutes or more in a month to coordinate care. 4. Only one practitioner may furnish and bill the service in a calendar month. 5.The patient may stop CCM services at any time (effective at the end of the month) by phone call to the office staff. 6. The patient will be responsible for cost sharing (co-pay) of up to 20% of the service fee (after annual deductible is met). Patient agreed to services and consent obtained.  Patient Care Team: [name], MD as PCP - General (Internal Medicine) Nahser, Wonda Cheng, MD as PCP - Cardiology (Cardiology) Inda Castle, MD (Inactive) (Gastroenterology) Camillo Flaming, [place] (Optometry) Carolan Clines, MD (Inactive) as Consulting Physician (Urology) Rozetta Nunnery, MD (Otolaryngology) Charlton Haws, [facility] as Pharmacist (Pharmacist)  Patient lives at home alone. He retired a few years ago. He has lost most hearing in R ear. He enjoys reading and watching tv.  Recent office  visits: 01/11/20 Dr Ronnald Ramp OV: chronic f/u, c/o itching in ears, fatigue/poor concentration. Rx'd Cortisporin ear drops and restarted methylphenidate.  Recent consult visits: 12/26/20 Dr [facility] acute ear problem, mycotic infection resolved, still some hearing loss.  11/18/20 Dr Acie Fredrickson (cardiology): f/u TIA, CHF. Coronary calcium 523 (76th percentile). BP on low side, may need to lower dose of olmesartan.  10/25/20 Dr Caralyn Guile (hand surgery) f/u visit.  09/13/20 Dr [name] (heme/onc): f/u IgG MGUS. Intermittent elevated Cr d/t diuretic. Rec'd increase water intake  07/30/20 Dr Lovena Neighbours (urology) :f/u BPH  Objective:  Lab Results  Component Value Date   CREATININE 1.79 (H) 09/06/2020   BUN 42 (H) 09/06/2020   GFR 66.54 05/15/2019   GFRNONAA 38 (L) 09/06/2020   GFRAA 44 (L) 09/06/2020   NA 137 09/06/2020   K 3.7 09/06/2020   CALCIUM 9.1 09/06/2020   CO2 27 09/06/2020    Lab Results  Component Value Date/Time   HGBA1C 5.9 11/29/2019 09:01 AM   HGBA1C 6.7 (H) 05/15/2019 11:51 AM   GFR 66.54 05/15/2019 11:51 AM   GFR 71.04 02/07/2019 09:08 AM   MICROALBUR 2.3 (H) 11/29/2019 09:02 AM    Last diabetic Eye exam:  Lab Results  Component Value Date/Time   HMDIABEYEEXA No Retinopathy 11/06/2019 12:00 AM    Last diabetic Foot exam: No results found for: HMDIABFOOTEX   Lab Results  Component Value Date   CHOL 107 05/27/2020   HDL 31 (L) 05/27/2020   LDLCALC 52 05/27/2020   LDLDIRECT 76.0 11/29/2019   TRIG 133 05/27/2020   CHOLHDL 3.5 05/27/2020    Hepatic Function Latest Ref Rng & Units  09/06/2020 05/27/2020 11/29/2019  Total Protein 6.5 - 8.1 g/dL 7.3 6.9 6.7  Albumin 3.5 - 5.0 g/dL 3.9 4.4 4.2  AST 15 - 41 U/L _0 ALT 0 - 44 U/L _1 Alk Phosphatase 38 - 126 U/L 51 48 44  Total Bilirubin 0.3 - 1.2 mg/dL 0.6 0.4 0.5  Bilirubin, Direct 0.00 - 0.40 mg/dL - 0.13 0.1    Lab Results  Component Value Date/Time   TSH 2.15 11/29/2019 09:01 AM   TSH 1.72  08/09/2018 09:14 AM    CBC Latest Ref Rng & Units 09/06/2020 09/07/2019 05/15/2019  WBC 4.0 - 10.5 K/uL 7.1 8.5 8.7  Hemoglobin 13.0 - 17.0 g/dL 12.7(L) 14.9 14.1  Hematocrit 39.0 - 52.0 % 36.5(L) 42.0 40.5  Platelets 150 - 400 K/uL 298 281 262.0    Lab Results  Component Value Date/Time   VD25OH 52.54 08/09/2018 09:14 AM   VD25OH 38.8 08/31/2017 08:47 AM   VD25OH 46 12/10/2009 09:56 PM    Clinical ASCVD: No  The ASCVD Risk score Mikey Bussing DC Jr., et al., 2013) failed to calculate for the following reasons:   The valid total cholesterol range is 130 to 320 mg/dL    Depression screen [facility] 2/9 11/29/2019 02/07/2019 08/09/2018  Decreased Interest _2 Down, Depressed, Hopeless _3 PHQ - 2 Score _4 Altered sleeping _5 Tired, decreased energy _6 Change in appetite _7 Feeling bad or failure about yourself  0 0 1  Trouble concentrating 2 0 0  Moving slowly or fidgety/restless 0 0 0  Suicidal thoughts 0 0 0  PHQ-9 Score _8 Difficult doing work/chores Somewhat difficult Not difficult at all Not difficult at all  Some recent data might be hidden   GAD 7 : Generalized Anxiety Score 11/29/2019  Nervous, Anxious, on Edge 1  Control/stop worrying 0  Worry too much - different things 0  Trouble relaxing 0  Restless 0  Easily annoyed or irritable 0  Afraid - awful might happen 0  Total GAD 7 Score 1  Anxiety Difficulty Not difficult at all   Lab Results  Component Value Date/Time   PSA 0.34 11/29/2019 09:01 AM   PSA 0.21 08/09/2018 09:14 AM   Lab Results  Component Value Date/Time   VITAMINB12 783 05/15/2019 11:51 AM   VITAMINB12 2,858 (H) 08/19/2018 08:03 AM   Lab Results  Component Value Date/Time   TESTOSTERONE 478 11/29/2019 09:01 AM   TESTOSTERONE 397 07/13/2017 09:33 AM   Social History   Tobacco Use  Smoking Status Never Smoker  Smokeless Tobacco Never Used   BP Readings from Last 3 Encounters:  11/18/20 96/68  09/13/20 132/79  08/26/20  112/75   Pulse Readings from Last 3 Encounters:  11/18/20 78  09/13/20 67  08/26/20 70   Wt Readings from Last 3 Encounters:  11/18/20 156 lb (70.8 kg)  09/13/20 165 lb 3.2 oz (74.9 kg)  08/26/20 165 lb (74.8 kg)    Assessment/Interventions: Review of patient past medical history, allergies, medications, health status, including review of consultants reports, laboratory and other test data, was performed as part of comprehensive evaluation and provision of chronic care management services.   SDOH:  (Social Determinants of Health) assessments and interventions performed:  SDOH Interventions   Flowsheet Row Most Recent Value  SDOH Interventions   Financial Strain Interventions Intervention Not Indicated  CCM Care Plan  No Known Allergies  Medications Reviewed Today    Reviewed by Charlton Haws, [facility] (Pharmacist) on 01/11/21 at 58  Med List Status: <None>  Medication Order Taking? Sig Documenting Provider Last Dose Status Informant  Acetaminophen 500 MG coapsule [id number] Yes Take 500 mg by mouth Nightly. [provider] Taking Active   alfuzosin (UROXATRAL) 10 MG 24 hr tablet [id number] Yes TAKE 1 TABLET(10 MG) BY MOUTH DAILY WITH BREAKFAST [name], MD Taking Active   aspirin 81 MG tablet [id number] Yes Take 1 tablet (81 mg total) by mouth daily. [name], MD Taking Active   Cholecalciferol (VITAMIN D) 2000 UNITS CAPS [id number] Yes Take 1 capsule (2,000 Units total) by mouth 1 dose over 46 hours. Ricard Dillon, MD Taking Active Self  DULoxetine (CYMBALTA) 30 MG capsule [id number] Yes TAKE 1 CAPSULE BY MOUTH EVERY DAY [name], MD Taking Active   esomeprazole (NEXIUM) 20 MG capsule [id number] Yes Take 20 mg by mouth daily at 12 noon. [provider] Taking Active   finasteride (PROSCAR) 5 MG tablet [id number] Yes TAKE 1 TABLET(5 MG) BY MOUTH DAILY [name], MD Taking Active   hydrochlorothiazide (HYDRODIURIL) 25 MG tablet  [id number] Yes TAKE 1 TABLET(25 MG) BY MOUTH DAILY [name], MD Taking Active   Krill Oil 500 MG CAPS [id number] Yes Take 1 capsule by mouth daily. [provider] Taking Active   lamoTRIgine (LAMICTAL) 25 MG tablet [id number] Yes TAKE 1 TABLET(25 MG) BY MOUTH TWICE DAILY [name], MD Taking Active   latanoprost (XALATAN) 0.005 % ophthalmic solution [id number] Yes Place 1 drop into both eyes at bedtime. [provider] Taking Active   methylphenidate (METADATE ER) 20 MG ER tablet [id number] Yes Take 1 tablet (20 mg total) by mouth daily. [name], MD Taking Active   metoprolol tartrate (LOPRESSOR) 25 MG tablet [id number] Yes Take 1.5 tablets (37.5 mg total) by mouth 2 (two) times daily.  Patient taking differently: Take 25 mg by mouth 2 (two) times daily.   Nahser, Wonda Cheng, MD Taking Active   olmesartan (BENICAR) 40 MG tablet [id number] Yes TAKE 1 TABLET(40 MG) BY MOUTH DAILY [name], MD Taking Active   Probiotic Product (PROBIOTIC-10 PO) [id number] Yes Take 1 capsule by mouth daily. [provider] Taking Active   rosuvastatin (CRESTOR) 20 MG tablet [id number]  Take 1 tablet (20 mg total) by mouth daily. Leanor Kail, PA  Expired 07/30/20 2359   Testosterone 20.25 MG/ACT (1.62%) GEL [id number] Yes APPLY 2 ACTUATIONS ONTO THE SKIN DAILY [name], MD Taking Active   timolol (TIMOPTIC) 0.5 % ophthalmic solution [id number] Yes INT 1 GTT IN [facility] EYE QAM [provider] Taking Active   vitamin B-12 (CYANOCOBALAMIN) 1000 MCG tablet [id number] Yes Take 1,000 mcg by mouth daily. [provider] Taking Active           Patient Active Problem List   Diagnosis Date Noted  . Chronic kidney disease, stage III (moderate) ([place]) 09/13/2020  . Anemia in chronic kidney disease 09/13/2020  . Chronic eczematous otitis externa of both ears 01/11/2020  . Pure hypertriglyceridemia 11/30/2019  . Elevated serum creatinine 09/14/2019  .  Chronic midline low back pain with bilateral sciatica 03/13/2019  . CHF (congestive heart failure), NYHA class I, chronic, diastolic ([place]) 52/77/8242  . Cervical disc disorder at C5-C6 level with radiculopathy 11/10/2017  . B12 deficiency 08/26/2017  . Obesity (BMI 30.0-34.9) 08/10/2017  .  Diastolic dysfunction without heart failure 07/28/2017  . Hypogonadism male 07/13/2017  . Erectile dysfunction due to arterial insufficiency 07/13/2017  . Nonspecific abnormal electrocardiogram (ECG) (EKG) 07/13/2017  . History of colonic polyps 09/16/2016  . MGUS (monoclonal gammopathy of unknown significance) 06/11/2016  . Neuropathy associated with monoclonal gammopathy of undetermined significance (HCC) 06/11/2016  . Coarse tremors 04/09/2016  . Routine general medical examination at a health care facility 02/03/2016  . Restless legs syndrome with nocturnal myoclonus 10/02/2015  . Acid reflux 05/07/2015  . Kidney cysts 05/07/2015  . Type II diabetes mellitus with manifestations (HCC) 11/14/2014  . OSTEOPENIA 09/24/2009  . DUPUYTREN'S CONTRACTURE, RIGHT 09/06/2008  . DEGENERATION, LUMBAR/LUMBOSACRAL DISC 07/15/2007  . Depression, controlled 06/14/2007  . Attention deficit disorder 06/14/2007  . Hyperlipidemia with target LDL less than 130 05/13/2007  . Essential hypertension 05/13/2007  . BPH associated with nocturia 05/13/2007    Immunization History  Administered Date(s) Administered  . Fluad Quad(high Dose 65+) 09/14/2019  . Influenza Split 08/15/2012  . Influenza Whole 09/06/2009, 10/07/2010  . Influenza, High Dose Seasonal PF 08/10/2017, 08/09/2018  . Influenza,inj,Quad PF,6+ Mos 08/31/2013, 10/02/2015  . Influenza-Unspecified 09/06/2014, 08/22/2016  . Pneumococcal Conjugate-13 04/09/2016  . Pneumococcal Polysaccharide-23 07/13/2017  . Td 03/31/2002  . Tdap 05/06/2012  . Zoster 02/13/2013    Conditions to be addressed/monitored:  CHF, HTN, HLD, DMII and Depression, BPH, GERD,  Osteopenia, Neuropathy  Patient Care Plan: CCM Pharmacy Care Plan    Problem Identified: CHF, HTN, HLD, DMII and Depression, BPH, GERD, Osteopenia, Neuropathy   Priority: High    Goal: Patient-Specific Goal   Start Date: 01/11/2021  Expected End Date: 07/11/2021  This Visit's Progress: On track  Priority: High  Note:   Current Barriers:  . Unable to independently monitor therapeutic efficacy  Pharmacist Clinical Goal(s):  Marland Kitchen Over the next 180 days, patient will achieve adherence to monitoring guidelines and medication adherence to achieve therapeutic efficacy through collaboration with PharmD and provider.   Interventions: . 1:1 collaboration with [name], MD regarding development and update of comprehensive plan of care as evidenced by provider attestation and co-signature . Inter-disciplinary care team collaboration (see longitudinal plan of care) . Comprehensive medication review performed; medication list updated in electronic medical record  Hyperlipidemia: (LDL goal < 100) -controlled -Current treatment: . Rosuvastatin 20 mg daily . Aspirin 81 mg daily -Educated on Cholesterol goals;  Benefits of statin for ASCVD risk reduction; -Recommended to continue current medication  Diabetes (A1c goal <7%) -controlled -Current medications: No medications -Educated onA1c and blood sugar goals; Exercise goal of 150 minutes per week; -Counseled to check feet daily and get yearly eye exams -Counseled on diet and exercise extensively  Heart Failure / HTN (Goal: BP goal < 130/80, control symptoms and prevent exacerbations) controlled Type: Diastolic -NYHA Class: I (no actitivty limitation) -Ejection fraction: 50-55% (Date: 07/27/2017) -Current treatment:  HCTZ 25 mg daily  Metoprolol tartrate 25 mg - 1 tab BID  Olmesartan 40 mg daily -Current home BP/HR readings: 120/70 -Educated on Benefits of medications for managing symptoms Importance of blood pressure  control -Recommended to continue current medication  Depression (Goal: manage symptoms) -controlled -Current treatment: . Duloxetine 30 mg daily . Lamotrigine 25 mg BI  Methylphenidate ER (Metadate) 20 mg daily -Medications previously tried/failed: citalopram, -PHQ9: 11 (11/2019) -GAD: 1 (11/2019) -Connected with PCP for mental health support -Educated on Benefits of medication for symptom control  -Discussed possibility of increasing duloxetine if further mood control is needed in the future;  currently pt is satisfied with current meds -Recommended to continue current medication  Osteopenia (Goal prevent fractures) -controlled -Last DEXA Scan: not on file -Patient is not a candidate for pharmacologic treatment -Current treatment   Vitamin D 2000 IU daily -Recommend [phone] units of vitamin D daily. Recommend 1200 mg of calcium daily from dietary and supplemental sources.  BPH (Goal: manage symptoms, prevent progression) -controlled -Current treatment  . Alfuzosin 10 mg daily . Finasteride 5 mg daily -Patient is satisfied with current regimen and denies issues -Recommended to continue current medication  Chronic pain / neuropathy (Goal: manage pain) -controlled -Neuropathy (monoclonal gammopathy), back pain w/ sciatica -Current treatment  . Tylenol 500 mg HS . Duloxetine 30 mg daily -Medications previously tried: tramadol, Percocet, methocarbamol  -Patient is satisfied with current regimen and denies issues -Recommended to continue current medication  GERD (Goal: prevent and manage symptoms) -controlled -Current treatment  . Esoemprazole 20 mg at noon -Pt buys OTC product for cost savings -Recommended to continue current medication  Health Maintenance -Vaccine gaps: Covid vaccine -Current therapy:  . Latanoprost 0.005% eye drops HS . Timolol 0.5% eye drops AM . Vitamin B12 1000 mcg daily -Patient is satisfied with current therapy and denies  issues -Recommended to continue current medication   Patient Goals/Self-Care Activities . Over the next 180 days, patient will:  - take medications as prescribed focus on medication adherence by pill box check blood pressure weekly, document, and provide at future appointments  Follow Up Plan: Telephone follow up appointment with care management team member scheduled for: 6 months     Medication Assistance: None required.  Patient affirms current coverage meets needs.  Patient's preferred pharmacy is:  [facility] [id number], [place] [address] [place] [address] Phone: [phone] Fax: [phone]  [id number] [place], [facility] [address] AT [address] [address] Phone: [phone] Fax: [phone]  Uses pill box? Yes Pt endorses 100% compliance  We discussed: Benefits of medication synchronization, packaging and delivery as well as enhanced pharmacist oversight with [employer]. Pt prefers to continue with [facility] since they have an App he can use to manage refills.  Plan: Continue current medication management strategy  Follow Up:  Patient agrees to Care Plan and Follow-up.  Plan: Telephone follow up appointment with care management team member scheduled for:  6 months  Charlene Brooke, PharmD, [facility] Clinical Pharmacist [facility] [phone]

## 2021-01-11 NOTE — Patient Instructions (Addendum)
Visit Information  Phone number for Pharmacist: [phone]  Thank you for meeting with me to discuss your medications! I look forward to working with you to achieve your health care goals. Below is a summary of what we talked about during the visit:  Goals Addressed            This Visit's Progress   . Manage My Medicine       Timeframe:  Long-Range Goal Priority:  High Start Date:          01/09/21                   Expected End Date:    07/09/21                   Follow Up Date 07/09/21    - call for medicine refill 2 or 3 days before it runs out - call if I am sick and can't take my medicine - keep a list of all the medicines I take; vitamins and herbals too - use a pillbox to sort medicine  -use Pharmacy smartphone app to order refills   Why is this important?   . These steps will help you keep on track with your medicines.      Patient Care Plan: CCM Pharmacy Care Plan    Problem Identified: CHF, HTN, HLD, DMII and Depression, BPH, GERD, Osteopenia, Neuropathy   Priority: High    Goal: Patient-Specific Goal   Start Date: 01/11/2021  Expected End Date: 07/11/2021  This Visit's Progress: On track  Priority: High  Note:   Current Barriers:  . Unable to independently monitor therapeutic efficacy  Pharmacist Clinical Goal(s):  Marland Kitchen Over the next 180 days, patient will achieve adherence to monitoring guidelines and medication adherence to achieve therapeutic efficacy through collaboration with PharmD and provider.   Interventions: . 1:1 collaboration with Robert Lima, MD regarding development and update of comprehensive plan of care as evidenced by provider attestation and co-signature . Inter-disciplinary care team collaboration (see longitudinal plan of care) . Comprehensive medication review performed; medication list updated in electronic medical record  Hyperlipidemia: (LDL goal < 100) -controlled -Current treatment: . Rosuvastatin 20 mg daily . Aspirin 81 mg  daily -Educated on Cholesterol goals;  Benefits of statin for ASCVD risk reduction; -Recommended to continue current medication  Diabetes (A1c goal <7%) -controlled -Current medications: No medications -Educated onA1c and blood sugar goals; Exercise goal of 150 minutes per week; -Counseled to check feet daily and get yearly eye exams -Counseled on diet and exercise extensively  Heart Failure / HTN (Goal: BP goal < 130/80, control symptoms and prevent exacerbations) controlled Type: Diastolic -NYHA Class: I (no actitivty limitation) -Ejection fraction: 50-55% (Date: 07/27/2017) -Current treatment:  HCTZ 25 mg daily  Metoprolol tartrate 25 mg - 1 tab BID  Olmesartan 40 mg daily -Current home BP/HR readings: 120/70 -Educated on Benefits of medications for managing symptoms Importance of blood pressure control -Recommended to continue current medication  Depression (Goal: manage symptoms) -controlled -Current treatment: . Duloxetine 30 mg daily . Lamotrigine 25 mg BI  Methylphenidate ER (Metadate) 20 mg daily -Medications previously tried/failed: citalopram, -PHQ9: 11 (11/2019) -GAD: 1 (11/2019) -Connected with PCP for mental health support -Educated on Benefits of medication for symptom control  -Discussed possibility of increasing duloxetine if further mood control is needed in the future; currently pt is satisfied with current meds -Recommended to continue current medication  Osteopenia (Goal prevent fractures) -controlled -Last DEXA Scan:  not on file -Patient is not a candidate for pharmacologic treatment -Current treatment   Vitamin D 2000 IU daily -Recommend [phone] units of vitamin D daily. Recommend 1200 mg of calcium daily from dietary and supplemental sources.  BPH (Goal: manage symptoms, prevent progression) -controlled -Current treatment  . Alfuzosin 10 mg daily . Finasteride 5 mg daily -Patient is satisfied with current regimen and denies  issues -Recommended to continue current medication  Chronic pain / neuropathy (Goal: manage pain) -controlled -Neuropathy (monoclonal gammopathy), back pain w/ sciatica -Current treatment  . Tylenol 500 mg HS . Duloxetine 30 mg daily -Medications previously tried: tramadol, Percocet, methocarbamol  -Patient is satisfied with current regimen and denies issues -Recommended to continue current medication  GERD (Goal: prevent and manage symptoms) -controlled -Current treatment  . Esoemprazole 20 mg at noon -Pt buys OTC product for cost savings -Recommended to continue current medication  Health Maintenance -Vaccine gaps: Covid vaccine -Current therapy:  . Latanoprost 0.005% eye drops HS . Timolol 0.5% eye drops AM . Vitamin B12 1000 mcg daily -Patient is satisfied with current therapy and denies issues -Recommended to continue current medication   Patient Goals/Self-Care Activities . Over the next 180 days, patient will:  - take medications as prescribed focus on medication adherence by pill box check blood pressure weekly, document, and provide at future appointments  Follow Up Plan: Telephone follow up appointment with care management team member scheduled for: 6 months     [name] was given information about Chronic Care Management services today including:  1. CCM service includes personalized support from designated clinical staff supervised by his physician, including individualized plan of care and coordination with other care providers 2. 24/7 contact phone numbers for assistance for urgent and routine care needs. 3. Standard insurance, coinsurance, copays and deductibles apply for chronic care management only during months in which we provide at least 20 minutes of these services. Most insurances cover these services at 100%, however patients may be responsible for any copay, coinsurance and/or deductible if applicable. This service may help you avoid the need for  more expensive face-to-face services. 4. Only one practitioner may furnish and bill the service in a calendar month. 5. The patient may stop CCM services at any time (effective at the end of the month) by phone call to the office staff.  Patient agreed to services and verbal consent obtained.   The patient verbalized understanding of instructions, educational materials, and care plan provided today and agreed to receive a mailed copy of patient instructions, educational materials, and care plan.  Telephone follow up appointment with pharmacy team member scheduled for: 6 months  Charlene Brooke, PharmD, BCACP Clinical Pharmacist [facility] [place] [facility] After Age 36 After age 78, you are at a higher risk for certain long-term diseases and infections as well as injuries from falls. Falls are a major cause of broken bones and head injuries in people who are older than age 66. Getting regular preventive care can help to keep you healthy and well. Preventive care includes getting regular testing and making lifestyle changes as recommended by your health care provider. Talk with your health care provider about:  Which screenings and tests you should have. A screening is a test that checks for a disease when you have no symptoms.  A diet and exercise plan that is right for you. What should I know about screenings and tests to prevent falls? Screening and testing are the best  ways to find a health problem early. Early diagnosis and treatment give you the best chance of managing medical conditions that are common after age 22. Certain conditions and lifestyle choices may make you more likely to have a fall. Your health care provider may recommend:  Regular vision checks. Poor vision and conditions such as cataracts can make you more likely to have a fall. If you wear glasses, make sure to get your prescription updated if your vision changes.  Medicine  review. Work with your health care provider to regularly review all of the medicines you are taking, including over-the-counter medicines. Ask your health care provider about any side effects that may make you more likely to have a fall. Tell your health care provider if any medicines that you take make you feel dizzy or sleepy.  Osteoporosis screening. Osteoporosis is a condition that causes the bones to get weaker. This can make the bones weak and cause them to break more easily.  Blood pressure screening. Blood pressure changes and medicines to control blood pressure can make you feel dizzy.  Strength and balance checks. Your health care provider may recommend certain tests to check your strength and balance while standing, walking, or changing positions.  Foot health exam. Foot pain and numbness, as well as not wearing proper footwear, can make you more likely to have a fall.  Depression screening. You may be more likely to have a fall if you have a fear of falling, feel emotionally low, or feel unable to do activities that you used to do.  Alcohol use screening. Using too much alcohol can affect your balance and may make you more likely to have a fall. What actions can I take to lower my risk of falls? General instructions  Talk with your health care provider about your risks for falling. Tell your health care provider if: ? You fall. Be sure to tell your health care provider about all falls, even ones that seem minor. ? You feel dizzy, sleepy, or off-balance.  Take over-the-counter and prescription medicines only as told by your health care provider. These include any supplements.  Eat a healthy diet and maintain a healthy weight. A healthy diet includes low-fat dairy products, low-fat (lean) meats, and fiber from whole grains, beans, and lots of fruits and vegetables. Home safety  Remove any tripping hazards, such as rugs, cords, and clutter.  Install safety equipment such as grab  bars in bathrooms and safety rails on stairs.  Keep rooms and walkways well-lit. Activity  Follow a regular exercise program to stay fit. This will help you maintain your balance. Ask your health care provider what types of exercise are appropriate for you.  If you need a cane or walker, use it as recommended by your health care provider.  Wear supportive shoes that have nonskid soles.   Lifestyle  Do not drink alcohol if your health care provider tells you not to drink.  If you drink alcohol, limit how much you have: ? 0-1 drink a day for women. ? 0-2 drinks a day for men.  Be aware of how much alcohol is in your drink. In the [place] one drink equals one typical bottle of beer (12 oz), one-half glass of wine (5 oz), or one shot of hard liquor (1 oz).  Do not use any products that contain nicotine or tobacco, such as cigarettes and e-cigarettes. If you need help quitting, ask your health care provider. Summary  Having a healthy lifestyle and getting  preventive care can help to protect your health and wellness after age 84.  Screening and testing are the best way to find a health problem early and help you avoid having a fall. Early diagnosis and treatment give you the best chance for managing medical conditions that are more common for people who are older than age 53.  Falls are a major cause of broken bones and head injuries in people who are older than age 16. Take precautions to prevent a fall at home.  Work with your health care provider to learn what changes you can make to improve your health and wellness and to prevent falls. This information is not intended to replace advice given to you by your health care provider. Make sure you discuss any questions you have with your health care provider. Document Revised: 03/16/2019 Document Reviewed: 10/06/2017 Elsevier Patient Education  2021 [employer].

## 2021-01-16 DIAGNOSIS — H903 Sensorineural hearing loss, bilateral: Secondary | ICD-10-CM | POA: Diagnosis not present

## 2021-01-29 LAB — HM DIABETES EYE EXAM

## 2021-02-05 ENCOUNTER — Encounter: Payer: Self-pay | Admitting: Internal Medicine

## 2021-02-08 ENCOUNTER — Encounter: Payer: Self-pay | Admitting: Internal Medicine

## 2021-02-10 ENCOUNTER — Other Ambulatory Visit: Payer: Self-pay | Admitting: Internal Medicine

## 2021-02-10 DIAGNOSIS — F900 Attention-deficit hyperactivity disorder, predominantly inattentive type: Secondary | ICD-10-CM

## 2021-02-10 MED ORDER — METHYLPHENIDATE HCL ER 20 MG PO TBCR: 20.0000 mg | EXTENDED_RELEASE_TABLET | Freq: Every day | ORAL | 0 refills | Status: DC

## 2021-02-25 ENCOUNTER — Ambulatory Visit (INDEPENDENT_AMBULATORY_CARE_PROVIDER_SITE_OTHER): Payer: PPO | Admitting: Internal Medicine

## 2021-02-25 ENCOUNTER — Encounter: Payer: Self-pay | Admitting: Internal Medicine

## 2021-02-25 ENCOUNTER — Other Ambulatory Visit: Payer: Self-pay

## 2021-02-25 VITALS — BP 126/84 | HR 82 | Temp 98.6°F | Ht 64.0 in | Wt 173.0 lb

## 2021-02-25 DIAGNOSIS — N183 Chronic kidney disease, stage 3 unspecified: Secondary | ICD-10-CM

## 2021-02-25 DIAGNOSIS — R351 Nocturia: Secondary | ICD-10-CM

## 2021-02-25 DIAGNOSIS — Z0000 Encounter for general adult medical examination without abnormal findings: Secondary | ICD-10-CM

## 2021-02-25 DIAGNOSIS — E118 Type 2 diabetes mellitus with unspecified complications: Secondary | ICD-10-CM | POA: Diagnosis not present

## 2021-02-25 DIAGNOSIS — N401 Enlarged prostate with lower urinary tract symptoms: Secondary | ICD-10-CM | POA: Diagnosis not present

## 2021-02-25 DIAGNOSIS — E538 Deficiency of other specified B group vitamins: Secondary | ICD-10-CM | POA: Diagnosis not present

## 2021-02-25 DIAGNOSIS — E785 Hyperlipidemia, unspecified: Secondary | ICD-10-CM | POA: Diagnosis not present

## 2021-02-25 DIAGNOSIS — I10 Essential (primary) hypertension: Secondary | ICD-10-CM | POA: Diagnosis not present

## 2021-02-25 DIAGNOSIS — D631 Anemia in chronic kidney disease: Secondary | ICD-10-CM | POA: Diagnosis not present

## 2021-02-25 LAB — IRON: Iron: 69 ug/dL (ref 42–165)

## 2021-02-25 LAB — CBC WITH DIFFERENTIAL/PLATELET
Basophils Absolute: 0 10*3/uL (ref 0.0–0.1)
Basophils Relative: 0.4 % (ref 0.0–3.0)
Eosinophils Absolute: 0.1 10*3/uL (ref 0.0–0.7)
Eosinophils Relative: 1.7 % (ref 0.0–5.0)
HCT: 42.3 % (ref 39.0–52.0)
Hemoglobin: 14.7 g/dL (ref 13.0–17.0)
Lymphocytes Relative: 33.9 % (ref 12.0–46.0)
Lymphs Abs: 2.5 10*3/uL (ref 0.7–4.0)
MCHC: 34.7 g/dL (ref 30.0–36.0)
MCV: 88.3 fl (ref 78.0–100.0)
Monocytes Absolute: 0.8 10*3/uL (ref 0.1–1.0)
Monocytes Relative: 10.3 % (ref 3.0–12.0)
Neutro Abs: 4 10*3/uL (ref 1.4–7.7)
Neutrophils Relative %: 53.7 % (ref 43.0–77.0)
Platelets: 236 10*3/uL (ref 150.0–400.0)
RBC: 4.79 Mil/uL (ref 4.22–5.81)
RDW: 13.6 % (ref 11.5–15.5)
WBC: 7.5 10*3/uL (ref 4.0–10.5)

## 2021-02-25 LAB — MICROALBUMIN / CREATININE URINE RATIO
Creatinine,U: 188.8 mg/dL
Microalb Creat Ratio: 2.8 mg/g (ref 0.0–30.0)
Microalb, Ur: 5.3 mg/dL — ABNORMAL HIGH (ref 0.0–1.9)

## 2021-02-25 LAB — BASIC METABOLIC PANEL
BUN: 17 mg/dL (ref 6–23)
CO2: 29 mEq/L (ref 19–32)
Calcium: 9.7 mg/dL (ref 8.4–10.5)
Chloride: 100 mEq/L (ref 96–112)
Creatinine, Ser: 1.11 mg/dL (ref 0.40–1.50)
GFR: 67.55 mL/min (ref >60.00)
Glucose, Bld: 114 mg/dL — ABNORMAL HIGH (ref 70–99)
Potassium: 4.4 mEq/L (ref 3.5–5.1)
Sodium: 136 mEq/L (ref 135–145)

## 2021-02-25 LAB — LIPID PANEL
Cholesterol: 145 mg/dL (ref 0–200)
HDL: 44 mg/dL (ref >39.00)
LDL Cholesterol: 64 mg/dL (ref 0–99)
NonHDL: 101.22
Total CHOL/HDL Ratio: 3
Triglycerides: 185 mg/dL — ABNORMAL HIGH (ref 0.0–149.0)
VLDL: 37 mg/dL (ref 0.0–40.0)

## 2021-02-25 LAB — HEPATIC FUNCTION PANEL
ALT: 28 U/L (ref 0–53)
AST: 26 U/L (ref 0–37)
Albumin: 4.5 g/dL (ref 3.5–5.2)
Alkaline Phosphatase: 58 U/L (ref 39–117)
Bilirubin, Direct: 0.1 mg/dL (ref 0.0–0.3)
Total Bilirubin: 0.5 mg/dL (ref 0.2–1.2)
Total Protein: 7.1 g/dL (ref 6.0–8.3)

## 2021-02-25 LAB — PSA: PSA: 0.91 ng/mL (ref 0.10–4.00)

## 2021-02-25 LAB — VITAMIN B12: Vitamin B-12: 560 pg/mL (ref 211–911)

## 2021-02-25 LAB — FERRITIN: Ferritin: 54.8 ng/mL (ref 22.0–322.0)

## 2021-02-25 LAB — TSH: TSH: 1.9 u[IU]/mL (ref 0.35–4.50)

## 2021-02-25 LAB — HEMOGLOBIN A1C: Hgb A1c MFr Bld: 6.1 % (ref 4.6–6.5)

## 2021-02-25 LAB — FOLATE: Folate: >23.6 ng/mL (ref >5.9)

## 2021-02-25 NOTE — Patient Instructions (Signed)

## 2021-02-25 NOTE — Progress Notes (Signed)
Subjective:  Patient ID: Robert Mccall, male    DOB: 1951/02/08  Age: 70 y.o. MRN: [id number]  CC: Annual Exam, Anemia, Hypertension, Hyperlipidemia, and Diabetes  This visit occurred during the SARS-CoV-2 public health emergency.  Safety protocols were in place, including screening questions prior to the visit, additional usage of staff PPE, and extensive cleaning of exam room while observing appropriate contact time as indicated for disinfecting solutions.    HPI Robert Mccall presents for a CPX and f/up -  He complains that his BP is dropping down to 115-120 and he is having intermittent dizziness/lightheadedness.  He is active and denies CP, palpitations, edema, or fatigue.  Outpatient Medications Prior to Visit  Medication Sig Dispense Refill  . Acetaminophen 500 MG coapsule Take 500 mg by mouth Nightly.    Marland Kitchen alfuzosin (UROXATRAL) 10 MG 24 hr tablet TAKE 1 TABLET(10 MG) BY MOUTH DAILY WITH BREAKFAST 90 tablet 1  . Cholecalciferol (VITAMIN D) 2000 UNITS CAPS Take 1 capsule (2,000 Units total) by mouth 1 dose over 46 hours. 30 capsule 11  . esomeprazole (NEXIUM) 20 MG capsule Take 20 mg by mouth daily at 12 noon.    Robert Mccall Oil 500 MG CAPS Take 1 capsule by mouth daily.    Marland Kitchen lamoTRIgine (LAMICTAL) 25 MG tablet TAKE 1 TABLET(25 MG) BY MOUTH TWICE DAILY 180 tablet 0  . latanoprost (XALATAN) 0.005 % ophthalmic solution Place 1 drop into both eyes at bedtime.    . methylphenidate (METADATE ER) 20 MG ER tablet Take 1 tablet (20 mg total) by mouth daily. 30 tablet 0  . metoprolol tartrate (LOPRESSOR) 25 MG tablet Take 1.5 tablets (37.5 mg total) by mouth 2 (two) times daily. (Patient taking differently: Take 25 mg by mouth 2 (two) times daily.) 270 tablet 3  . olmesartan (BENICAR) 40 MG tablet TAKE 1 TABLET(40 MG) BY MOUTH DAILY 90 tablet 0  . Probiotic Product (PROBIOTIC-10 PO) Take 1 capsule by mouth daily.    . Testosterone 20.25 MG/ACT (1.62%) GEL APPLY 2 ACTUATIONS ONTO THE  SKIN DAILY 75 g 1  . timolol (TIMOPTIC) 0.5 % ophthalmic solution INT 1 GTT IN EACH EYE QAM  0  . vitamin B-12 (CYANOCOBALAMIN) 1000 MCG tablet Take 1,000 mcg by mouth daily.    . DULoxetine (CYMBALTA) 30 MG capsule TAKE 1 CAPSULE BY MOUTH EVERY DAY 90 capsule 1  . finasteride (PROSCAR) 5 MG tablet TAKE 1 TABLET(5 MG) BY MOUTH DAILY 90 tablet 1  . hydrochlorothiazide (HYDRODIURIL) 25 MG tablet TAKE 1 TABLET(25 MG) BY MOUTH DAILY 90 tablet 0  . rosuvastatin (CRESTOR) 20 MG tablet Take 1 tablet (20 mg total) by mouth daily. 90 tablet 3  . aspirin 81 MG tablet Take 1 tablet (81 mg total) by mouth daily. 90 tablet 1   No facility-administered medications prior to visit.    ROS Review of Systems  Constitutional: Negative for chills, diaphoresis, fatigue and fever.  HENT: Negative.   Eyes: Negative.   Respiratory: Negative.  Negative for cough, chest tightness and wheezing.   Cardiovascular: Negative for chest pain, palpitations and leg swelling.  Gastrointestinal: Negative for abdominal pain, constipation, diarrhea, nausea and vomiting.  Endocrine: Negative.   Genitourinary: Positive for difficulty urinating. Negative for dysuria, penile swelling, scrotal swelling, testicular pain and urgency.       Weak urine stream and incomplete emptying  Musculoskeletal: Negative.  Negative for arthralgias and myalgias.  Skin: Negative.   Neurological: Positive for dizziness and light-headedness. Negative for  speech difficulty, weakness and numbness.  Hematological: Negative for adenopathy. Does not bruise/bleed easily.  Psychiatric/Behavioral: Negative.     Objective:  BP 126/84   Pulse 82   Temp 98.6 F (37 C) (Oral)   Ht 5\' 4"  (1.626 m)   Wt 173 lb (78.5 kg)   SpO2 96%   BMI 29.70 kg/m   BP Readings from Last 3 Encounters:  02/25/21 126/84  11/18/20 96/68  09/13/20 132/79    Wt Readings from Last 3 Encounters:  02/25/21 173 lb (78.5 kg)  11/18/20 156 lb (70.8 kg)  09/13/20 165 lb  3.2 oz (74.9 kg)    Physical Exam Vitals reviewed.  Constitutional:      Appearance: Normal appearance.  HENT:     Nose: Nose normal.     Mouth/Throat:     Mouth: Mucous membranes are moist.  Eyes:     General: No scleral icterus.    Conjunctiva/sclera: Conjunctivae normal.  Cardiovascular:     Rate and Rhythm: Normal rate and regular rhythm.     Pulses: Normal pulses.     Heart sounds: No murmur heard.   Pulmonary:     Effort: Pulmonary effort is normal.     Breath sounds: No stridor. No wheezing, rhonchi or rales.  Abdominal:     General: Abdomen is flat. Bowel sounds are normal. There is no distension.     Palpations: Abdomen is soft. There is no hepatomegaly, splenomegaly or mass.     Tenderness: There is no abdominal tenderness.     Hernia: There is no hernia in the left inguinal area or right inguinal area.  Genitourinary:    Pubic Area: No rash.      Penis: Normal and circumcised.      Testes: Normal.     Epididymis:     Right: Normal.     Left: Normal.     Prostate: Enlarged. Not tender and no nodules present.     Rectum: Normal. Guaiac result negative. No mass, tenderness, anal fissure, external hemorrhoid or internal hemorrhoid. Normal anal tone.  Musculoskeletal:        General: Normal range of motion.     Cervical back: Neck supple.     Right lower leg: No edema.     Left lower leg: No edema.  Lymphadenopathy:     Cervical: No cervical adenopathy.     Lower Body: No right inguinal adenopathy. No left inguinal adenopathy.  Skin:    General: Skin is warm and dry.     Coloration: Skin is not pale.  Neurological:     General: No focal deficit present.     Mental Status: He is alert.  Psychiatric:        Mood and Affect: Mood normal.        Behavior: Behavior normal.     Lab Results  Component Value Date   WBC 7.5 02/25/2021   HGB 14.7 02/25/2021   HCT 42.3 02/25/2021   PLT 236.0 02/25/2021   GLUCOSE 114 (H) 02/25/2021   CHOL 145 02/25/2021    TRIG 185.0 (H) 02/25/2021   HDL 44.00 02/25/2021   LDLDIRECT 76.0 11/29/2019   LDLCALC 64 02/25/2021   ALT 28 02/25/2021   AST 26 02/25/2021   NA 136 02/25/2021   K 4.4 02/25/2021   CL 100 02/25/2021   CREATININE 1.11 02/25/2021   BUN 17 02/25/2021   CO2 29 02/25/2021   TSH 1.90 02/25/2021   PSA 0.91 02/25/2021   INR 1.04  01/23/2017   HGBA1C 6.1 02/25/2021   MICROALBUR 5.3 (H) 02/25/2021    No results found.  Assessment & Plan:   Martino was seen today for annual exam, anemia, hypertension, hyperlipidemia and diabetes.  Diagnoses and all orders for this visit:  Essential hypertension- His BP is somewhat overcontrolled and he is symptomatic.  Will discontinue the thiazide diuretic. -     Basic metabolic panel; Future -     TSH; Future -     Urinalysis, Routine w reflex microscopic; Future -     Urinalysis, Routine w reflex microscopic -     TSH -     Basic metabolic panel  Type II diabetes mellitus with manifestations ([place])- His blood sugar is adequately well controlled. -     Basic metabolic panel; Future -     Microalbumin / creatinine urine ratio; Future -     Hemoglobin A1c; Future -     HM Diabetes Foot Exam -     Hemoglobin A1c -     Microalbumin / creatinine urine ratio -     Basic metabolic panel  Stage 3 chronic kidney disease, unspecified whether stage 3a or 3b CKD ([place])- His GFR is at 59.  Will discontinue the thiazide diuretic.  He will avoid nephrotoxic agents. -     Microalbumin / creatinine urine ratio; Future -     Urinalysis, Routine w reflex microscopic; Future -     Urinalysis, Routine w reflex microscopic -     Microalbumin / creatinine urine ratio  B12 deficiency- His H&H, B12, and folate levels are normal now. -     CBC with Differential/Platelet; Future -     Vitamin B12; Future -     Folate; Future -     Folate -     Vitamin B12 -     CBC with Differential/Platelet  Anemia in stage 3 chronic kidney disease, unspecified whether stage 3a or  3b CKD ([place])- His H&H are normal now. -     CBC with Differential/Platelet; Future -     Vitamin B12; Future -     Iron; Future -     Ferritin; Future -     Folate; Future -     Folate -     Ferritin -     Iron -     Vitamin B12 -     CBC with Differential/Platelet  BPH associated with nocturia- His symptoms are adequately well controlled. -     PSA; Future -     PSA  Routine general medical examination at a health care facility- Exam completed, labs reviewed, vaccines reviewed and updated, cancer screenings are up-to-date, patient education was given.  Hyperlipidemia with target LDL less than 130- He has achieved his LDL goal is doing well on the statin. -     Lipid panel; Future -     TSH; Future -     Hepatic function panel; Future -     Hepatic function panel -     TSH -     Lipid panel   I have discontinued Robert Mccall. Robert Mccall's aspirin and hydrochlorothiazide. I have also changed his finasteride. Additionally, I am having him maintain his Vitamin D, vitamin B-12, Acetaminophen, Testosterone, timolol, latanoprost, esomeprazole, rosuvastatin, metoprolol tartrate, alfuzosin, lamoTRIgine, olmesartan, Krill Oil, Probiotic Product (PROBIOTIC-10 PO), and methylphenidate.  Meds ordered this encounter  Medications  . finasteride (PROSCAR) 5 MG tablet    Sig: Take 1 tablet (5 mg total) by  mouth daily.    Dispense:  90 tablet    Refill:  1     Follow-up: Return in about 6 months (around 08/28/2021).  Scarlette Calico, MD

## 2021-02-26 LAB — URINALYSIS, ROUTINE W REFLEX MICROSCOPIC
Bilirubin Urine: NEGATIVE
Hgb urine dipstick: NEGATIVE
Ketones, ur: NEGATIVE
Leukocytes,Ua: NEGATIVE
Nitrite: NEGATIVE
RBC / HPF: NONE SEEN (ref 0–?)
Specific Gravity, Urine: 1.025 (ref 1.000–1.030)
Total Protein, Urine: NEGATIVE
Urine Glucose: NEGATIVE
Urobilinogen, UA: 0.2 (ref 0.0–1.0)
pH: 6 (ref 5.0–8.0)

## 2021-02-27 ENCOUNTER — Encounter: Payer: Self-pay | Admitting: Internal Medicine

## 2021-03-01 ENCOUNTER — Other Ambulatory Visit: Payer: Self-pay | Admitting: Internal Medicine

## 2021-03-01 MED ORDER — FINASTERIDE 5 MG PO TABS: 5.0000 mg | ORAL_TABLET | Freq: Every day | ORAL | 1 refills | Status: DC

## 2021-03-07 ENCOUNTER — Other Ambulatory Visit: Payer: Self-pay | Admitting: Physician Assistant

## 2021-03-10 ENCOUNTER — Other Ambulatory Visit: Payer: Self-pay | Admitting: Internal Medicine

## 2021-03-10 DIAGNOSIS — I10 Essential (primary) hypertension: Secondary | ICD-10-CM

## 2021-03-10 DIAGNOSIS — F331 Major depressive disorder, recurrent, moderate: Secondary | ICD-10-CM

## 2021-03-10 MED ORDER — LAMOTRIGINE 25 MG PO TABS
25.0000 mg | ORAL_TABLET | Freq: Two times a day (BID) | ORAL | 1 refills | Status: DC
Start: 2021-03-10 — End: 2021-11-13

## 2021-03-14 ENCOUNTER — Other Ambulatory Visit: Payer: Self-pay

## 2021-03-14 ENCOUNTER — Encounter: Payer: Self-pay | Admitting: Internal Medicine

## 2021-03-14 DIAGNOSIS — F900 Attention-deficit hyperactivity disorder, predominantly inattentive type: Secondary | ICD-10-CM

## 2021-03-14 NOTE — Telephone Encounter (Signed)
Requesting: Methylphenidate Contact: Yes Last Visit: 02/25/21 Next Visit: 07/11/21 Last refill: 02/10/21 Please advise

## 2021-03-15 MED ORDER — METHYLPHENIDATE HCL ER 20 MG PO TBCR: 20.0000 mg | EXTENDED_RELEASE_TABLET | Freq: Every day | ORAL | 0 refills | Status: DC

## 2021-04-06 ENCOUNTER — Other Ambulatory Visit: Payer: Self-pay | Admitting: Internal Medicine

## 2021-04-06 DIAGNOSIS — R351 Nocturia: Secondary | ICD-10-CM

## 2021-04-06 DIAGNOSIS — N401 Enlarged prostate with lower urinary tract symptoms: Secondary | ICD-10-CM

## 2021-05-01 ENCOUNTER — Encounter: Payer: Self-pay | Admitting: Internal Medicine

## 2021-05-01 DIAGNOSIS — R351 Nocturia: Secondary | ICD-10-CM

## 2021-05-01 DIAGNOSIS — N401 Enlarged prostate with lower urinary tract symptoms: Secondary | ICD-10-CM

## 2021-05-02 MED ORDER — ALFUZOSIN HCL ER 10 MG PO TB24: ORAL_TABLET | ORAL | 1 refills | Status: DC

## 2021-06-25 ENCOUNTER — Other Ambulatory Visit: Payer: Self-pay | Admitting: Internal Medicine

## 2021-06-25 ENCOUNTER — Encounter: Payer: Self-pay | Admitting: Internal Medicine

## 2021-06-25 DIAGNOSIS — F900 Attention-deficit hyperactivity disorder, predominantly inattentive type: Secondary | ICD-10-CM

## 2021-06-25 MED ORDER — METHYLPHENIDATE HCL ER 20 MG PO TBCR: 20.0000 mg | EXTENDED_RELEASE_TABLET | Freq: Every day | ORAL | 0 refills | Status: DC

## 2021-06-30 ENCOUNTER — Telehealth: Payer: Self-pay

## 2021-06-30 NOTE — Telephone Encounter (Signed)
Key: [id number]

## 2021-07-01 NOTE — Telephone Encounter (Signed)
Approved 06/30/2021 -06/30/2022

## 2021-07-10 ENCOUNTER — Telehealth: Payer: Self-pay | Admitting: Pharmacist

## 2021-07-10 NOTE — Chronic Care Management (AMB) (Signed)
    Chronic Care Management Pharmacy Assistant   Name: Robert Mccall  MRN: [id number] DOB: [birth year]  Reason for Encounter: Reschedule appointment    Medications: Outpatient Encounter Medications as of 07/10/2021  Medication Sig   Acetaminophen 500 MG coapsule Take 500 mg by mouth Nightly.   alfuzosin (UROXATRAL) 10 MG 24 hr tablet TAKE 1 TABLET(10 MG) BY MOUTH DAILY WITH BREAKFAST   Cholecalciferol (VITAMIN D) 2000 UNITS CAPS Take 1 capsule (2,000 Units total) by mouth 1 dose over 46 hours.   DULoxetine (CYMBALTA) 30 MG capsule TAKE 1 CAPSULE BY MOUTH EVERY DAY   esomeprazole (NEXIUM) 20 MG capsule Take 20 mg by mouth daily at 12 noon.   finasteride (PROSCAR) 5 MG tablet TAKE 1 TABLET(5 MG) BY MOUTH DAILY   Krill Oil 500 MG CAPS Take 1 capsule by mouth daily.   lamoTRIgine (LAMICTAL) 25 MG tablet Take 1 tablet (25 mg total) by mouth 2 (two) times daily.   latanoprost (XALATAN) 0.005 % ophthalmic solution Place 1 drop into both eyes at bedtime.   methylphenidate (METADATE ER) 20 MG ER tablet Take 1 tablet (20 mg total) by mouth daily.   metoprolol tartrate (LOPRESSOR) 25 MG tablet Take 1.5 tablets (37.5 mg total) by mouth 2 (two) times daily. (Patient taking differently: Take 25 mg by mouth 2 (two) times daily.)   olmesartan (BENICAR) 40 MG tablet TAKE 1 TABLET(40 MG) BY MOUTH DAILY   Probiotic Product (PROBIOTIC-10 PO) Take 1 capsule by mouth daily.   rosuvastatin (CRESTOR) 20 MG tablet TAKE 1 TABLET(20 MG) BY MOUTH DAILY   Testosterone 20.25 MG/ACT (1.62%) GEL APPLY 2 ACTUATIONS ONTO THE SKIN DAILY   timolol (TIMOPTIC) 0.5 % ophthalmic solution INT 1 GTT IN EACH EYE QAM   vitamin B-12 (CYANOCOBALAMIN) 1000 MCG tablet Take 1,000 mcg by mouth daily.   No facility-administered encounter medications on file as of 07/10/2021.    Contacted patient to reschedule 07/11/21 appointment with Charlene Brooke. Appointment was rescheduled to 08/04/21 at 2:00.   Charlene Brooke, CPP  notified  Margaretmary Dys, [place] Pharmacy Assistant [phone]

## 2021-07-11 ENCOUNTER — Telehealth: Payer: PRIVATE HEALTH INSURANCE

## 2021-08-04 ENCOUNTER — Other Ambulatory Visit: Payer: Self-pay

## 2021-08-04 ENCOUNTER — Ambulatory Visit (INDEPENDENT_AMBULATORY_CARE_PROVIDER_SITE_OTHER): Payer: PPO | Admitting: Pharmacist

## 2021-08-04 DIAGNOSIS — I10 Essential (primary) hypertension: Secondary | ICD-10-CM | POA: Diagnosis not present

## 2021-08-04 DIAGNOSIS — G63 Polyneuropathy in diseases classified elsewhere: Secondary | ICD-10-CM

## 2021-08-04 DIAGNOSIS — E785 Hyperlipidemia, unspecified: Secondary | ICD-10-CM

## 2021-08-04 DIAGNOSIS — I5032 Chronic diastolic (congestive) heart failure: Secondary | ICD-10-CM | POA: Diagnosis not present

## 2021-08-04 DIAGNOSIS — D472 Monoclonal gammopathy: Secondary | ICD-10-CM

## 2021-08-04 NOTE — Patient Instructions (Signed)
Visit Information  Phone number for Pharmacist: [phone]   Goals Addressed   None     Care Plan : [place]  Updates made by Charlton Haws, [place] since 08/04/2021 12:00 AM     Problem: CHF, HTN, HLD, Neuropathy   Priority: High     Long-Range Goal: Disease management   Start Date: 01/11/2021  Expected End Date: 07/11/2021  This Visit's Progress: On track  Recent Progress: On track  Priority: High  Note:   Current Barriers:  Unable to independently monitor therapeutic efficacy  Pharmacist Clinical Goal(s):  Patient will achieve adherence to monitoring guidelines and medication adherence to achieve therapeutic efficacy through collaboration with PharmD and provider.   Interventions: 1:1 collaboration with [name], MD regarding development and update of comprehensive plan of care as evidenced by provider attestation and co-signature Inter-disciplinary care team collaboration (see longitudinal plan of care) Comprehensive medication review performed; medication list updated in electronic medical record  Hyperlipidemia: (LDL goal < 100) -Controlled - LDL is at goal; pt endorses compliance with statin -Current treatment: Rosuvastatin 20 mg daily Aspirin 81 mg daily -Educated on Cholesterol goals; Benefits of statin for ASCVD risk reduction; -Recommended to continue current medication  Heart Failure / HTN (Goal: BP goal < 130/80, control symptoms and prevent exacerbations) -Controlled - BP is at goal in recent office visits; pt endorses compliance with medicaitons and denies issues -HF Type: Diastolic -NYHA Class: I (no actitivty limitation) -Ejection fraction: 50-55% (Date: 07/27/2017) -Current home BP/HR readings: good -Current treatment: Metoprolol tartrate 25 mg - 1 tab BID Olmesartan 40 mg daily -Recommended to continue current medication  Chronic pain / neuropathy (Goal: manage pain) -Controlled - pt reports he sometimes takes breaks on  duloxetine; was was off of this for a few weeks and recently restarted; he reports less nerve issues since restarting -Neuropathy (monoclonal gammopathy), back pain w/ sciatica -Current treatment  Tylenol 500 mg HS Duloxetine 30 mg daily -Medications previously tried: tramadol, Percocet, methocarbamol  -Counseled on benefits of duloxetine for neuropathy -Recommended to continue duloxetine without breaks in therapy  Health Maintenance -Vaccine gaps: Covid vaccine, Shingrix -pt reports he had 4th Covid dose 04/05/21 ([employer]) -Advised to get Shingrix vaccine at local pharmacy  Patient Goals/Self-Care Activities Patient will:  - take medications as prescribed -focus on medication adherence by pill box -check blood pressure weekly, document, and provide at future appointments -Continue duloxetine without breaks in therapy -Get Shingrix vaccine at local pharmacy       Patient verbalizes understanding of instructions provided today and agrees to view in [place].  Telephone follow up appointment with pharmacy team member scheduled for: 1 year  Charlene Brooke, PharmD, [place], CPP Clinical Pharmacist [facility] at [facility] [phone]

## 2021-08-04 NOTE — Progress Notes (Signed)
Chronic Care Management Pharmacy Note  08/04/2021 Name:  Robert Mccall MRN:  [id number] DOB:  1951/11/30  Summary: -Pt reports he takes time off duloxetine occasionally, he did not realize it would be helpful for neuropathic pain -Pt reports he had Covid booster at [facility] 04/05/21  Recommendations/Changes made from today's visit: -Advised to continue duloxetine without breaks -Advised to get Shingrix vaccine at local pharmacy   Subjective: Robert Mccall is an 70 y.o. year old male who is a primary patient of [name], MD.  The CCM team was consulted for assistance with disease management and care coordination needs.    Engaged with patient by telephone for follow up visit in response to provider referral for pharmacy case management and/or care coordination services.   Consent to Services:  The patient was given information about Chronic Care Management services, agreed to services, and gave verbal consent prior to initiation of services.  Please see initial visit note for detailed documentation.   Patient Care Team: [name], MD as PCP - General (Internal Medicine) Nahser, Wonda Cheng, MD as PCP - Cardiology (Cardiology) Inda Castle, MD (Inactive) (Gastroenterology) Camillo Flaming, [place] (Optometry) Carolan Clines, MD (Inactive) as Consulting Physician (Urology) Rozetta Nunnery, MD (Otolaryngology) Charlton Haws, [facility] as Pharmacist (Pharmacist)  Patient lives at home alone. He retired a few years ago. He has lost most hearing in R ear. He enjoys reading and watching tv.  Recent office visits: 02/25/21 Dr Ronnald Ramp OV: c/o intermittent low BP and dizziness. D/c HCTZ  01/11/20 Dr Ronnald Ramp OV: chronic f/u, c/o itching in ears, fatigue/poor concentration. Rx'd Cortisporin ear drops and restarted methylphenidate.  Recent consult visits: 12[facility]20 [facility] acute ear problem, mycotic infection resolved, still some hearing loss.  11/18/20 Dr  Acie Fredrickson (cardiology): f/u TIA, CHF. Coronary calcium 523 (76th percentile). BP on low side, may need to lower dose of olmesartan.  10/25/20 Dr Caralyn Guile (hand surgery) f/u visit.  09/13/20 Dr [name] (heme/onc): f/u IgG MGUS. Intermittent elevated Cr d/t diuretic. Rec'd increase water intake  07/30/20 Dr Lovena Neighbours (urology) :f/u BPH  Objective:  Lab Results  Component Value Date   CREATININE 1.11 02/25/2021   BUN 17 02/25/2021   GFR 67.55 02/25/2021   GFRNONAA 38 (L) 09/06/2020   GFRAA 44 (L) 09/06/2020   NA 136 02/25/2021   K 4.4 02/25/2021   CALCIUM 9.7 02/25/2021   CO2 29 02/25/2021    Lab Results  Component Value Date/Time   HGBA1C 6.1 02/25/2021 02:35 PM   HGBA1C 5.9 11/29/2019 09:01 AM   GFR 67.55 02/25/2021 02:35 PM   GFR 66.54 05/15/2019 11:51 AM   MICROALBUR 5.3 (H) 02/25/2021 02:35 PM   MICROALBUR 2.3 (H) 11/29/2019 09:02 AM    Last diabetic Eye exam:  Lab Results  Component Value Date/Time   HMDIABEYEEXA No Retinopathy 01/29/2021 12:00 AM    Last diabetic Foot exam: No results found for: HMDIABFOOTEX   Lab Results  Component Value Date   CHOL 145 02/25/2021   HDL 44.00 02/25/2021   LDLCALC 64 02/25/2021   LDLDIRECT 76.0 11/29/2019   TRIG 185.0 (H) 02/25/2021   CHOLHDL 3 02/25/2021    Hepatic Function Latest Ref Rng & Units 02/25/2021 09/06/2020 05/27/2020  Total Protein 6.0 - 8.3 g/dL 7.1 7.3 6.9  Albumin 3.5 - 5.2 g/dL 4.5 3.9 4.4  AST 0 - 37 U/L 26 30 23   ALT 0 - 53 U/L 28 27 20   Alk Phosphatase 39 - 117 U/L  58 51 48  Total Bilirubin 0.2 - 1.2 mg/dL 0.5 0.6 0.4  Bilirubin, Direct 0.0 - 0.3 mg/dL 0.1 - 0.13    Lab Results  Component Value Date/Time   TSH 1.90 02/25/2021 02:35 PM   TSH 2.15 11/29/2019 09:01 AM    CBC Latest Ref Rng & Units 02/25/2021 09/06/2020 09/07/2019  WBC 4.0 - 10.5 K/uL 7.5 7.1 8.5  Hemoglobin 13.0 - 17.0 g/dL 14.7 12.7(L) 14.9  Hematocrit 39.0 - 52.0 % 42.3 36.5(L) 42.0  Platelets 150.0 - 400.0 K/uL 236.0 298 281    Lab  Results  Component Value Date/Time   VD25OH 52.54 08/09/2018 09:14 AM   VD25OH 38.8 08/31/2017 08:47 AM   VD25OH 46 12/10/2009 09:56 PM    Clinical ASCVD: No  The 10-year ASCVD risk score Mikey Bussing DC Jr., et al., 2013) is: 32.5%   Values used to calculate the score:     Age: 70 years     Sex: Male     Is Non-Hispanic African American: No     Diabetic: Yes     Tobacco smoker: No     Systolic Blood Pressure: 884 mmHg     Is BP treated: Yes     HDL Cholesterol: 44 mg/dL     Total Cholesterol: 145 mg/dL    Depression screen [facility] 2/9 02/25/2021 11/29/2019 02/07/2019  Decreased Interest 0 1 1  Down, Depressed, Hopeless 1 1 1   PHQ - 2 Score 1 2 2   Altered sleeping 3 2 1   Tired, decreased energy 3 3 1   Change in appetite 3 2 1   Feeling bad or failure about yourself  0 0 0  Trouble concentrating 0 2 0  Moving slowly or fidgety/restless 0 0 0  Suicidal thoughts 0 0 0  PHQ-9 Score 10 11 5   Difficult doing work/chores - Somewhat difficult Not difficult at all  Some recent data might be hidden   GAD 7 : Generalized Anxiety Score 11/29/2019  Nervous, Anxious, on Edge 1  Control/stop worrying 0  Worry too much - different things 0  Trouble relaxing 0  Restless 0  Easily annoyed or irritable 0  Afraid - awful might happen 0  Total GAD 7 Score 1  Anxiety Difficulty Not difficult at all   Lab Results  Component Value Date/Time   PSA 0.91 02/25/2021 02:35 PM   PSA 0.34 11/29/2019 09:01 AM   Lab Results  Component Value Date/Time   VITAMINB12 560 02/25/2021 02:35 PM   ZYSAYTKZ60 109 05/15/2019 11:51 AM   Lab Results  Component Value Date/Time   TESTOSTERONE 478 11/29/2019 09:01 AM   TESTOSTERONE 397 07/13/2017 09:33 AM   Social History   Tobacco Use  Smoking Status Never  Smokeless Tobacco Never   BP Readings from Last 3 Encounters:  02/25/21 126/84  11/18/20 96/68  09/13/20 132/79   Pulse Readings from Last 3 Encounters:  02/25/21 82  11/18/20 78  09/13/20 67   Wt  Readings from Last 3 Encounters:  02/25/21 173 lb (78.5 kg)  11/18/20 156 lb (70.8 kg)  09/13/20 165 lb 3.2 oz (74.9 kg)   BMI Readings from Last 3 Encounters:  02/25/21 29.70 kg/m  11/18/20 26.78 kg/m  09/13/20 28.36 kg/m    Assessment/Interventions: Review of patient past medical history, allergies, medications, health status, including review of consultants reports, laboratory and other test data, was performed as part of comprehensive evaluation and provision of chronic care management services.   SDOH:  (Social Determinants of Health) assessments and  interventions performed:   SDOH Screenings   Alcohol Screen: Not on file  Depression (PHQ2-9): Medium Risk   PHQ-2 Score: 10  Financial Resource Strain: Low Risk    Difficulty of Paying Living Expenses: Not hard at all  Food Insecurity: Not on file  Housing: Not on file  Physical Activity: Not on file  Social Connections: Not on file  Stress: Not on file  Tobacco Use: Low Risk    Smoking Tobacco Use: Never   Smokeless Tobacco Use: Never  Transportation Needs: Not on file     [place]  No Known Allergies  Medications Reviewed Today     Reviewed by Charlton Haws, [facility] (Pharmacist) on 08/04/21 at 1422  Med List Status: <None>   Medication Order Taking? Sig Documenting Provider Last Dose Status Informant  alfuzosin (UROXATRAL) 10 MG 24 hr tablet [id number] Yes TAKE 1 TABLET(10 MG) BY MOUTH DAILY WITH BREAKFAST [name], MD Taking Active   Cholecalciferol (VITAMIN D) 2000 UNITS CAPS [id number] Yes Take 1 capsule (2,000 Units total) by mouth 1 dose over 46 hours. Ricard Dillon, MD Taking Active Self  DULoxetine (CYMBALTA) 30 MG capsule [id number] Yes TAKE 1 CAPSULE BY MOUTH EVERY DAY [name], MD Taking Active   esomeprazole (NEXIUM) 20 MG capsule [id number] Yes Take 20 mg by mouth daily at 12 noon. [provider] Taking Active   finasteride (PROSCAR) 5 MG tablet [id number] Yes TAKE 1  TABLET(5 MG) BY MOUTH DAILY [name], MD Taking Active   Krill Oil 500 MG CAPS [id number] Yes Take 1 capsule by mouth daily. [provider] Taking Active   lamoTRIgine (LAMICTAL) 25 MG tablet [id number] Yes Take 1 tablet (25 mg total) by mouth 2 (two) times daily. [name], MD Taking Active   latanoprost (XALATAN) 0.005 % ophthalmic solution [id number] Yes Place 1 drop into both eyes at bedtime. [provider] Taking Active   methylphenidate (METADATE ER) 20 MG ER tablet [id number] Yes Take 1 tablet (20 mg total) by mouth daily. [name], MD Taking Active   metoprolol tartrate (LOPRESSOR) 25 MG tablet [id number] Yes Take 1.5 tablets (37.5 mg total) by mouth 2 (two) times daily.  Patient taking differently: Take 25 mg by mouth 2 (two) times daily.   Nahser, Wonda Cheng, MD Taking Active   olmesartan (BENICAR) 40 MG tablet [id number] Yes TAKE 1 TABLET(40 MG) BY MOUTH DAILY [name], MD Taking Active   Probiotic Product (PROBIOTIC-10 PO) [id number] Yes Take 1 capsule by mouth daily. [provider] Taking Active   rosuvastatin (CRESTOR) 20 MG tablet [id number] Yes TAKE 1 TABLET(20 MG) BY MOUTH DAILY Nahser, Wonda Cheng, MD Taking Active   Testosterone 20.25 MG/ACT (1.62%) GEL [id number] Yes APPLY 2 ACTUATIONS ONTO THE SKIN DAILY [name], MD Taking Active   timolol (TIMOPTIC) 0.5 % ophthalmic solution [id number] Yes INT 1 GTT IN [facility] EYE QAM [provider] Taking Active   vitamin B-12 (CYANOCOBALAMIN) 1000 MCG tablet [id number] Yes Take 1,000 mcg by mouth daily. [provider] Taking Active             Patient Active Problem List   Diagnosis Date Noted   Chronic kidney disease, stage III (moderate) ([place]) 09/13/2020   Anemia in chronic kidney disease 09/13/2020   Chronic eczematous otitis externa of both ears 01/11/2020   Pure hypertriglyceridemia 11/30/2019   Elevated serum creatinine 09/14/2019   Chronic midline low back  pain with bilateral sciatica  03/13/2019   CHF (congestive heart failure), NYHA class I, chronic, diastolic (HCC) 11/25/7587   Cervical disc disorder at C5-C6 level with radiculopathy 11/10/2017   B12 deficiency 08/26/2017   Obesity (BMI 30.0-34.9) 32/54/9826   Diastolic dysfunction without heart failure 07/28/2017   Hypogonadism male 07/13/2017   Erectile dysfunction due to arterial insufficiency 07/13/2017   Nonspecific abnormal electrocardiogram (ECG) (EKG) 07/13/2017   History of colonic polyps 09/16/2016   MGUS (monoclonal gammopathy of unknown significance) 06/11/2016   Neuropathy associated with monoclonal gammopathy of undetermined significance ([place]) 06/11/2016   Coarse tremors 04/09/2016   Routine general medical examination at a health care facility 02/03/2016   Restless legs syndrome with nocturnal myoclonus 10/02/2015   Acid reflux 05/07/2015   Kidney cysts 05/07/2015   Type II diabetes mellitus with manifestations ([place]) 11/14/2014   OSTEOPENIA 09/24/2009   DUPUYTREN'S CONTRACTURE, RIGHT 09/06/2008   DEGENERATION, LUMBAR/LUMBOSACRAL DISC 07/15/2007   Depression, controlled 06/14/2007   Attention deficit disorder 06/14/2007   Hyperlipidemia with target LDL less than 130 05/13/2007   Essential hypertension 05/13/2007   BPH associated with nocturia 05/13/2007    Immunization History  Administered Date(s) Administered   Fluad Quad(high Dose 65+) 09/14/2019   Influenza Split 08/15/2012   Influenza Whole 09/06/2009, 10/07/2010   Influenza, High Dose Seasonal PF 08/10/2017, 08/09/2018   Influenza,inj,Quad PF,6+ Mos 08/31/2013, 10/02/2015   Influenza-Unspecified 09/06/2014, 08/22/2016   PFIZER(Purple Top)SARS-COV-2 Vaccination 01/19/2020, 02/13/2020, 09/07/2020, 04/05/2021   Pneumococcal Conjugate-13 04/09/2016   Pneumococcal Polysaccharide-23 07/13/2017   Td 03/31/2002   Tdap 05/06/2012   Zoster, Live 02/13/2013    Conditions to be addressed/monitored: Hypertension,  Hyperlipidemia, Heart Failure, and Neuropathy   Care Plan : [place]  Updates made by Charlton Haws, [place] since 08/04/2021 12:00 AM     Problem: CHF, HTN, HLD, Neuropathy   Priority: High     Long-Range Goal: Disease management   Start Date: 01/11/2021  Expected End Date: 07/11/2021  This Visit's Progress: On track  Recent Progress: On track  Priority: High  Note:   Current Barriers:  Unable to independently monitor therapeutic efficacy  Pharmacist Clinical Goal(s):  Patient will achieve adherence to monitoring guidelines and medication adherence to achieve therapeutic efficacy through collaboration with PharmD and provider.   Interventions: 1:1 collaboration with [name], MD regarding development and update of comprehensive plan of care as evidenced by provider attestation and co-signature Inter-disciplinary care team collaboration (see longitudinal plan of care) Comprehensive medication review performed; medication list updated in electronic medical record  Hyperlipidemia: (LDL goal < 100) -Controlled - LDL is at goal; pt endorses compliance with statin -Current treatment: Rosuvastatin 20 mg daily Aspirin 81 mg daily -Educated on Cholesterol goals; Benefits of statin for ASCVD risk reduction; -Recommended to continue current medication  Heart Failure / HTN (Goal: BP goal < 130/80, control symptoms and prevent exacerbations) -Controlled - BP is at goal in recent office visits; pt endorses compliance with medicaitons and denies issues -HF Type: Diastolic -NYHA Class: I (no actitivty limitation) -Ejection fraction: 50-55% (Date: 07/27/2017) -Current home BP/HR readings: good -Current treatment: Metoprolol tartrate 25 mg - 1 tab BID Olmesartan 40 mg daily -Recommended to continue current medication  Chronic pain / neuropathy (Goal: manage pain) -Controlled - pt reports he sometimes takes breaks on duloxetine; was was off of this for a few weeks and  recently restarted; he reports less nerve issues since restarting -Neuropathy (monoclonal gammopathy), back pain w/ sciatica -Current treatment  Tylenol 500 mg HS Duloxetine  30 mg daily -Medications previously tried: tramadol, Percocet, methocarbamol  -Counseled on benefits of duloxetine for neuropathy -Recommended to continue duloxetine without breaks in therapy  Health Maintenance -Vaccine gaps: Covid vaccine, Shingrix -pt reports he had 4th Covid dose 04/05/21 ([employer]) -Advised to get Shingrix vaccine at local pharmacy  Patient Goals/Self-Care Activities Patient will:  - take medications as prescribed -focus on medication adherence by pill box -check blood pressure weekly, document, and provide at future appointments -Continue duloxetine without breaks in therapy -Get Shingrix vaccine at local pharmacy       Medication Assistance: None required.  Patient affirms current coverage meets needs.  Compliance/Adherence/Medication fill history: Care Gaps: Vaccines - Shingrix, covid booster  Star-Rating Drugs: Olmesartan - LF 07/02/21 x 90 ds Rosuvastatin - LF 06/02/21 x 90 ds  Patient's preferred pharmacy is:  [facility] [address] [place] [address] [place] [address] Phone: [phone] Fax: [phone]  [id number] [place], [address] [address] AT [address] [address] Phone: [phone] Fax: [phone]  Uses pill box? Yes Pt endorses 100% compliance  We discussed: Benefits of medication synchronization, packaging and delivery as well as enhanced pharmacist oversight with [employer]. Pt prefers to continue with [facility] since they have an App he can use to manage refills.  Plan: Continue current medication management strategy  Follow Up:  Patient agrees to Care Plan and Follow-up.  Plan: Telephone follow up appointment with care  management team member scheduled for:  6 months  Charlene Brooke, PharmD, [place], CPP Clinical Pharmacist [facility] at [facility] [phone]

## 2021-08-20 ENCOUNTER — Encounter: Payer: Self-pay | Admitting: Internal Medicine

## 2021-08-28 ENCOUNTER — Other Ambulatory Visit: Payer: Self-pay

## 2021-08-28 ENCOUNTER — Ambulatory Visit (INDEPENDENT_AMBULATORY_CARE_PROVIDER_SITE_OTHER): Payer: PPO | Admitting: Internal Medicine

## 2021-08-28 ENCOUNTER — Encounter: Payer: Self-pay | Admitting: Internal Medicine

## 2021-08-28 VITALS — BP 124/86 | HR 74 | Temp 98.1°F | Ht 64.0 in

## 2021-08-28 DIAGNOSIS — F900 Attention-deficit hyperactivity disorder, predominantly inattentive type: Secondary | ICD-10-CM | POA: Diagnosis not present

## 2021-08-28 DIAGNOSIS — E118 Type 2 diabetes mellitus with unspecified complications: Secondary | ICD-10-CM

## 2021-08-28 DIAGNOSIS — Z111 Encounter for screening for respiratory tuberculosis: Secondary | ICD-10-CM

## 2021-08-28 DIAGNOSIS — Z23 Encounter for immunization: Secondary | ICD-10-CM

## 2021-08-28 DIAGNOSIS — R072 Precordial pain: Secondary | ICD-10-CM | POA: Insufficient documentation

## 2021-08-28 DIAGNOSIS — I10 Essential (primary) hypertension: Secondary | ICD-10-CM

## 2021-08-28 LAB — BASIC METABOLIC PANEL
BUN: 22 mg/dL (ref 6–23)
CO2: 27 mEq/L (ref 19–32)
Calcium: 9.4 mg/dL (ref 8.4–10.5)
Chloride: 105 mEq/L (ref 96–112)
Creatinine, Ser: 1.13 mg/dL (ref 0.40–1.50)
GFR: 65.88 mL/min (ref >60.00)
Glucose, Bld: 104 mg/dL — ABNORMAL HIGH (ref 70–99)
Potassium: 4.1 mEq/L (ref 3.5–5.1)
Sodium: 139 mEq/L (ref 135–145)

## 2021-08-28 LAB — BRAIN NATRIURETIC PEPTIDE: Pro B Natriuretic peptide (BNP): 24 pg/mL (ref 0.0–100.0)

## 2021-08-28 LAB — TROPONIN I (HIGH SENSITIVITY): High Sens Troponin I: 5 ng/L (ref 2–17)

## 2021-08-28 MED ORDER — METHYLPHENIDATE HCL ER 20 MG PO TBCR: 20.0000 mg | EXTENDED_RELEASE_TABLET | Freq: Every day | ORAL | 0 refills | Status: DC

## 2021-08-28 NOTE — Patient Instructions (Signed)

## 2021-08-28 NOTE — Progress Notes (Signed)
Subjective:  Patient ID: Robert Mccall, male    DOB: 1951-10-07  Age: 70 y.o. MRN: [id number]  CC: Hypertension  This visit occurred during the SARS-CoV-2 public health emergency.  Safety protocols were in place, including screening questions prior to the visit, additional usage of staff PPE, and extensive cleaning of exam room while observing appropriate contact time as indicated for disinfecting solutions.    HPI [name] presents for f/up -  He complains of a 75-month history of chest pain.  He describes an achy sensation, diffusely in the anterior chest, that only occurs at rest.  He denies DOE, diaphoresis, cough, hemoptysis, wheezing, shortness of breath, fever, chills, or lymphadenopathy.   Pt states ADD status overall stable on current meds with overall good compliance and tolerability, and good effectiveness with respect to ability for concentration and task completion.   Outpatient Medications Prior to Visit  Medication Sig Dispense Refill   alfuzosin (UROXATRAL) 10 MG 24 hr tablet TAKE 1 TABLET(10 MG) BY MOUTH DAILY WITH BREAKFAST 90 tablet 1   Cholecalciferol (VITAMIN D) 2000 UNITS CAPS Take 1 capsule (2,000 Units total) by mouth 1 dose over 46 hours. 30 capsule 11   DULoxetine (CYMBALTA) 30 MG capsule TAKE 1 CAPSULE BY MOUTH EVERY DAY 90 capsule 1   esomeprazole (NEXIUM) 20 MG capsule Take 20 mg by mouth daily at 12 noon.     finasteride (PROSCAR) 5 MG tablet TAKE 1 TABLET(5 MG) BY MOUTH DAILY 90 tablet 1   Krill Oil 500 MG CAPS Take 1 capsule by mouth daily.     lamoTRIgine (LAMICTAL) 25 MG tablet Take 1 tablet (25 mg total) by mouth 2 (two) times daily. 180 tablet 1   latanoprost (XALATAN) 0.005 % ophthalmic solution Place 1 drop into both eyes at bedtime.     metoprolol tartrate (LOPRESSOR) 25 MG tablet Take 1.5 tablets (37.5 mg total) by mouth 2 (two) times daily. (Patient taking differently: Take 25 mg by mouth 2 (two) times daily.) 270 tablet 3   olmesartan  (BENICAR) 40 MG tablet TAKE 1 TABLET(40 MG) BY MOUTH DAILY 90 tablet 1   Probiotic Product (PROBIOTIC-10 PO) Take 1 capsule by mouth daily.     rosuvastatin (CRESTOR) 20 MG tablet TAKE 1 TABLET(20 MG) BY MOUTH DAILY 90 tablet 2   Testosterone 20.25 MG/ACT (1.62%) GEL APPLY 2 ACTUATIONS ONTO THE SKIN DAILY 75 g 1   timolol (TIMOPTIC) 0.5 % ophthalmic solution INT 1 GTT IN EACH EYE QAM  0   vitamin B-12 (CYANOCOBALAMIN) 1000 MCG tablet Take 1,000 mcg by mouth daily.     methylphenidate (METADATE ER) 20 MG ER tablet Take 1 tablet (20 mg total) by mouth daily. 90 tablet 0   No facility-administered medications prior to visit.    ROS Review of Systems  Constitutional:  Negative for appetite change, diaphoresis, fatigue and unexpected weight change.  HENT:  Negative for sore throat and trouble swallowing.   Eyes: Negative.   Respiratory:  Negative for cough, chest tightness, shortness of breath and wheezing.   Cardiovascular:  Positive for chest pain. Negative for palpitations and leg swelling.  Gastrointestinal:  Negative for abdominal pain, constipation, diarrhea, nausea and vomiting.  Endocrine: Negative.   Genitourinary: Negative.  Negative for difficulty urinating, dysuria and hematuria.  Musculoskeletal:  Negative for arthralgias, back pain and myalgias.  Skin: Negative.  Negative for color change and pallor.  Neurological: Negative.  Negative for dizziness, weakness, light-headedness and numbness.  Hematological:  Negative for  adenopathy. Does not bruise/bleed easily.  Psychiatric/Behavioral: Negative.     Objective:  BP 124/86 (BP Location: Right Arm, Patient Position: Sitting, Cuff Size: Large)   Pulse 74   Temp 98.1 F (36.7 C) (Oral)   Ht 5\' 4"  (1.626 m)   SpO2 95%   BMI 29.70 kg/m   BP Readings from Last 3 Encounters:  08/28/21 124/86  02/25/21 126/84  11/18/20 96/68    Wt Readings from Last 3 Encounters:  02/25/21 173 lb (78.5 kg)  11/18/20 156 lb (70.8 kg)   09/13/20 165 lb 3.2 oz (74.9 kg)    Physical Exam Vitals reviewed.  Constitutional:      Appearance: Normal appearance.  HENT:     Nose: Nose normal.     Mouth/Throat:     Mouth: Mucous membranes are moist.  Eyes:     Conjunctiva/sclera: Conjunctivae normal.  Cardiovascular:     Rate and Rhythm: Normal rate and regular rhythm.     Heart sounds: Normal heart sounds, S1 normal and S2 normal.    No friction rub. No gallop.     Comments: EKG- NSR, 72 bpm No ST/T wave changes No LVH or Q waves Normal EKG Pulmonary:     Effort: Pulmonary effort is normal.     Breath sounds: No stridor. No wheezing, rhonchi or rales.  Abdominal:     General: Abdomen is flat.     Palpations: There is no mass.     Tenderness: There is no abdominal tenderness. There is no guarding.     Hernia: No hernia is present.  Musculoskeletal:     Cervical back: Neck supple.     Right lower leg: No edema.     Left lower leg: No edema.  Lymphadenopathy:     Cervical: No cervical adenopathy.  Skin:    General: Skin is warm.  Neurological:     General: No focal deficit present.     Mental Status: He is alert.  Psychiatric:        Mood and Affect: Mood normal.        Behavior: Behavior normal.    Lab Results  Component Value Date   WBC 7.5 02/25/2021   HGB 14.7 02/25/2021   HCT 42.3 02/25/2021   PLT 236.0 02/25/2021   GLUCOSE 104 (H) 08/28/2021   CHOL 145 02/25/2021   TRIG 185.0 (H) 02/25/2021   HDL 44.00 02/25/2021   LDLDIRECT 76.0 11/29/2019   LDLCALC 64 02/25/2021   ALT 28 02/25/2021   AST 26 02/25/2021   NA 139 08/28/2021   K 4.1 08/28/2021   CL 105 08/28/2021   CREATININE 1.13 08/28/2021   BUN 22 08/28/2021   CO2 27 08/28/2021   TSH 1.90 02/25/2021   PSA 0.91 02/25/2021   INR 1.04 01/23/2017   HGBA1C 6.1 08/28/2021   MICROALBUR 5.3 (H) 02/25/2021    No results found.  Assessment & Plan:   Eldo was seen today for hypertension.  Diagnoses and all orders for this  visit:  Type II diabetes mellitus with manifestations ([place])- His blood sugar is adequately well controlled. -     Basic metabolic panel; Future -     Hemoglobin A1c; Future -     Hemoglobin A1c -     Basic metabolic panel  Essential hypertension- His blood pressure is well controlled. -     Basic metabolic panel; Future -     EKG 12-Lead -     Basic metabolic panel  Attention deficit hyperactivity  disorder (ADHD), predominantly inattentive type -     methylphenidate (METADATE ER) 20 MG ER tablet; Take 1 tablet (20 mg total) by mouth daily.  Flu vaccine need -     Flu Vaccine QUAD High Dose(Fluad)  Precordial pain- Based on his symptoms, exam, labs, and EKG I do not think he has cardiopulmonary disease.  I think the pain is either musculoskeletal or related to anxiety. -     Troponin I (High Sensitivity); Future -     Brain natriuretic peptide; Future -     D-dimer, quantitative; Future -     D-dimer, quantitative -     Brain natriuretic peptide -     Troponin I (High Sensitivity)  Screening-pulmonary TB -     QuantiFERON-TB Gold Plus; Future -     QuantiFERON-TB Gold Plus  I am having Hardin Negus. Fritts maintain his Vitamin D, vitamin B-12, Testosterone, timolol, latanoprost, esomeprazole, metoprolol tartrate, Krill Oil, Probiotic Product (PROBIOTIC-10 PO), DULoxetine, rosuvastatin, olmesartan, lamoTRIgine, finasteride, alfuzosin, and methylphenidate.  Meds ordered this encounter  Medications   methylphenidate (METADATE ER) 20 MG ER tablet    Sig: Take 1 tablet (20 mg total) by mouth daily.    Dispense:  90 tablet    Refill:  0     Follow-up: Return in about 6 months (around 02/25/2022).  Scarlette Calico, MD

## 2021-08-29 LAB — HEMOGLOBIN A1C: Hgb A1c MFr Bld: 6.1 % (ref 4.6–6.5)

## 2021-09-02 LAB — D-DIMER, QUANTITATIVE: D-Dimer, Quant: 0.27 mcg/mL FEU (ref <0.50)

## 2021-09-02 LAB — QUANTIFERON-TB GOLD PLUS
Mitogen-NIL: >10 IU/mL
NIL: 0.04 IU/mL
QuantiFERON-TB Gold Plus: NEGATIVE
TB1-NIL: <0 IU/mL
TB2-NIL: <0 IU/mL

## 2021-09-05 ENCOUNTER — Other Ambulatory Visit: Payer: Self-pay

## 2021-09-05 ENCOUNTER — Inpatient Hospital Stay: Payer: PPO | Attending: Hematology and Oncology

## 2021-09-05 DIAGNOSIS — D472 Monoclonal gammopathy: Secondary | ICD-10-CM | POA: Insufficient documentation

## 2021-09-05 LAB — COMPREHENSIVE METABOLIC PANEL
ALT: 41 U/L (ref 0–44)
AST: 31 U/L (ref 15–41)
Albumin: 4.4 g/dL (ref 3.5–5.0)
Alkaline Phosphatase: 77 U/L (ref 38–126)
Anion gap: 11 (ref 5–15)
BUN: 19 mg/dL (ref 8–23)
CO2: 23 mmol/L (ref 22–32)
Calcium: 9.6 mg/dL (ref 8.9–10.3)
Chloride: 107 mmol/L (ref 98–111)
Creatinine, Ser: 1.14 mg/dL (ref 0.61–1.24)
GFR, Estimated: >60 mL/min (ref >60)
Glucose, Bld: 123 mg/dL — ABNORMAL HIGH (ref 70–99)
Potassium: 4 mmol/L (ref 3.5–5.1)
Sodium: 141 mmol/L (ref 135–145)
Total Bilirubin: 0.7 mg/dL (ref 0.3–1.2)
Total Protein: 7.6 g/dL (ref 6.5–8.1)

## 2021-09-05 LAB — CBC WITH DIFFERENTIAL/PLATELET
Abs Immature Granulocytes: 0.01 10*3/uL (ref 0.00–0.07)
Basophils Absolute: 0 10*3/uL (ref 0.0–0.1)
Basophils Relative: 0 %
Eosinophils Absolute: 0.1 10*3/uL (ref 0.0–0.5)
Eosinophils Relative: 2 %
HCT: 42.4 % (ref 39.0–52.0)
Hemoglobin: 14.9 g/dL (ref 13.0–17.0)
Immature Granulocytes: 0 %
Lymphocytes Relative: 37 %
Lymphs Abs: 2.9 10*3/uL (ref 0.7–4.0)
MCH: 30.2 pg (ref 26.0–34.0)
MCHC: 35.1 g/dL (ref 30.0–36.0)
MCV: 85.8 fL (ref 80.0–100.0)
Monocytes Absolute: 0.8 10*3/uL (ref 0.1–1.0)
Monocytes Relative: 11 %
Neutro Abs: 4 10*3/uL (ref 1.7–7.7)
Neutrophils Relative %: 50 %
Platelets: 237 10*3/uL (ref 150–400)
RBC: 4.94 MIL/uL (ref 4.22–5.81)
RDW: 12.9 % (ref 11.5–15.5)
WBC: 7.9 10*3/uL (ref 4.0–10.5)
nRBC: 0 % (ref 0.0–0.2)

## 2021-09-08 LAB — KAPPA/LAMBDA LIGHT CHAINS
Kappa free light chain: 17.8 mg/L (ref 3.3–19.4)
Kappa, lambda light chain ratio: 1.16 (ref 0.26–1.65)
Lambda free light chains: 15.3 mg/L (ref 5.7–26.3)

## 2021-09-09 LAB — MULTIPLE MYELOMA PANEL, SERUM
Albumin SerPl Elph-Mcnc: 4 g/dL (ref 2.9–4.4)
Albumin/Glob SerPl: 1.3 (ref 0.7–1.7)
Alpha 1: 0.2 g/dL (ref 0.0–0.4)
Alpha2 Glob SerPl Elph-Mcnc: 0.8 g/dL (ref 0.4–1.0)
B-Globulin SerPl Elph-Mcnc: 0.9 g/dL (ref 0.7–1.3)
Gamma Glob SerPl Elph-Mcnc: 1.2 g/dL (ref 0.4–1.8)
Globulin, Total: 3.2 g/dL (ref 2.2–3.9)
IgA: 189 mg/dL (ref 61–437)
IgG (Immunoglobin G), Serum: 1157 mg/dL (ref 603–1613)
IgM (Immunoglobulin M), Srm: 112 mg/dL (ref 20–172)
M Protein SerPl Elph-Mcnc: 0.6 g/dL — ABNORMAL HIGH
Total Protein ELP: 7.2 g/dL (ref 6.0–8.5)

## 2021-09-10 ENCOUNTER — Encounter: Payer: Self-pay | Admitting: Internal Medicine

## 2021-09-10 MED ORDER — METOPROLOL TARTRATE 25 MG PO TABS: 37.5000 mg | ORAL_TABLET | Freq: Two times a day (BID) | ORAL | 1 refills | Status: DC

## 2021-09-12 ENCOUNTER — Inpatient Hospital Stay: Payer: PPO | Attending: Hematology and Oncology | Admitting: Hematology and Oncology

## 2021-09-12 ENCOUNTER — Encounter: Payer: Self-pay | Admitting: Hematology and Oncology

## 2021-09-12 ENCOUNTER — Other Ambulatory Visit: Payer: Self-pay

## 2021-09-12 VITALS — BP 138/87 | HR 70 | Temp 97.7°F | Resp 17 | Wt 173.2 lb

## 2021-09-12 DIAGNOSIS — R944 Abnormal results of kidney function studies: Secondary | ICD-10-CM | POA: Insufficient documentation

## 2021-09-12 DIAGNOSIS — R7989 Other specified abnormal findings of blood chemistry: Secondary | ICD-10-CM

## 2021-09-12 DIAGNOSIS — D472 Monoclonal gammopathy: Secondary | ICD-10-CM | POA: Diagnosis not present

## 2021-09-12 DIAGNOSIS — Z79899 Other long term (current) drug therapy: Secondary | ICD-10-CM | POA: Diagnosis not present

## 2021-09-12 MED ORDER — VITAMIN D 50 MCG (2000 UT) PO CAPS: 2000.0000 [IU] | ORAL_CAPSULE | Freq: Every day | ORAL | 11 refills | Status: AC

## 2021-09-12 NOTE — Assessment & Plan Note (Signed)
He has intermittent elevated serum creatinine secondary to diuretic therapy, overall stable This is not related to multiple myeloma

## 2021-09-12 NOTE — Assessment & Plan Note (Signed)
The patient is noted to have IgG lambda MGUS. According to his recent blood work, and there were nothing to suggest end organ damage. So far, he has no signs of disease progression I spent a lot of time educating the patient the natural history of MGUS I will see him once a year with repeat labs and examination

## 2021-09-12 NOTE — Progress Notes (Signed)
[place] OFFICE PROGRESS NOTE  Patient Care Team: [name], MD as PCP - General (Internal Medicine) Nahser, Wonda Cheng, MD as PCP - Cardiology (Cardiology) Inda Castle, MD (Inactive) (Gastroenterology) Camillo Flaming, [place] (Optometry) Carolan Clines, MD (Inactive) as Consulting Physician (Urology) Rozetta Nunnery, MD (Otolaryngology) Charlton Haws, [facility] as Pharmacist (Pharmacist)  ASSESSMENT & PLAN:  MGUS (monoclonal gammopathy of unknown significance) The patient is noted to have IgG lambda MGUS. According to his recent blood work, and there were nothing to suggest end organ damage. So far, he has no signs of disease progression I spent a lot of time educating the patient the natural history of MGUS I will see him once a year with repeat labs and examination  Elevated serum creatinine He has intermittent elevated serum creatinine secondary to diuretic therapy, overall stable This is not related to multiple myeloma  Orders Placed This Encounter  Procedures   CBC with Differential/Platelet    Standing Status:   Standing    Number of Occurrences:   22    Standing Expiration Date:   09/12/2022   Comprehensive metabolic panel    Standing Status:   Standing    Number of Occurrences:   33    Standing Expiration Date:   09/12/2022   Kappa/lambda light chains    Standing Status:   Standing    Number of Occurrences:   22    Standing Expiration Date:   09/12/2022   Multiple Myeloma Panel (SPEP&IFE w/QIG)    Standing Status:   Standing    Number of Occurrences:   22    Standing Expiration Date:   09/12/2022    All questions were answered. The patient knows to call the clinic with any problems, questions or concerns. The total time spent in the appointment was 20 minutes encounter with patients including review of chart and various tests results, discussions about plan of care and coordination of care plan   Heath Lark, MD 09/12/2021 9:55  AM  INTERVAL HISTORY: Please see below for problem oriented charting. he returns for annual follow-up for IgG lambda MGUS He is doing well Denies recent new bone pain No recent infection, fever or chills  REVIEW OF SYSTEMS:   Constitutional: Denies fevers, chills or abnormal weight loss Eyes: Denies blurriness of vision Ears, nose, mouth, throat, and face: Denies mucositis or sore throat Respiratory: Denies cough, dyspnea or wheezes Cardiovascular: Denies palpitation, chest discomfort or lower extremity swelling Gastrointestinal:  Denies nausea, heartburn or change in bowel habits Skin: Denies abnormal skin rashes Lymphatics: Denies new lymphadenopathy or easy bruising Neurological:Denies numbness, tingling or new weaknesses Behavioral/Psych: Mood is stable, no new changes  All other systems were reviewed with the patient and are negative.  I have reviewed the past medical history, past surgical history, social history and family history with the patient and they are unchanged from previous note.  ALLERGIES:  has No Known Allergies.  MEDICATIONS:  Current Outpatient Medications  Medication Sig Dispense Refill   alfuzosin (UROXATRAL) 10 MG 24 hr tablet TAKE 1 TABLET(10 MG) BY MOUTH DAILY WITH BREAKFAST 90 tablet 1   Cholecalciferol (VITAMIN D) 50 MCG (2000 UT) CAPS Take 1 capsule (2,000 Units total) by mouth daily. 30 capsule 11   DULoxetine (CYMBALTA) 30 MG capsule TAKE 1 CAPSULE BY MOUTH EVERY DAY 90 capsule 1   esomeprazole (NEXIUM) 20 MG capsule Take 20 mg by mouth daily at 12 noon.     finasteride (PROSCAR) 5 MG  tablet TAKE 1 TABLET(5 MG) BY MOUTH DAILY 90 tablet 1   Krill Oil 500 MG CAPS Take 1 capsule by mouth daily.     lamoTRIgine (LAMICTAL) 25 MG tablet Take 1 tablet (25 mg total) by mouth 2 (two) times daily. 180 tablet 1   latanoprost (XALATAN) 0.005 % ophthalmic solution Place 1 drop into both eyes at bedtime.     methylphenidate (METADATE ER) 20 MG ER tablet Take 1  tablet (20 mg total) by mouth daily. 90 tablet 0   metoprolol tartrate (LOPRESSOR) 25 MG tablet Take 1.5 tablets (37.5 mg total) by mouth 2 (two) times daily. 270 tablet 1   olmesartan (BENICAR) 40 MG tablet TAKE 1 TABLET(40 MG) BY MOUTH DAILY 90 tablet 1   Probiotic Product (PROBIOTIC-10 PO) Take 1 capsule by mouth daily.     rosuvastatin (CRESTOR) 20 MG tablet TAKE 1 TABLET(20 MG) BY MOUTH DAILY 90 tablet 2   Testosterone 20.25 MG/ACT (1.62%) GEL APPLY 2 ACTUATIONS ONTO THE SKIN DAILY 75 g 1   timolol (TIMOPTIC) 0.5 % ophthalmic solution INT 1 GTT IN EACH EYE QAM  0   vitamin B-12 (CYANOCOBALAMIN) 1000 MCG tablet Take 1,000 mcg by mouth daily.     No current facility-administered medications for this visit.    SUMMARY OF ONCOLOGIC HISTORY: Oncology History   No history exists.    PHYSICAL EXAMINATION: ECOG PERFORMANCE STATUS: 0 - Asymptomatic  Vitals:   09/12/21 0851  BP: 138/87  Pulse: 70  Resp: 17  Temp: 97.7 F (36.5 C)  SpO2: 100%   Filed Weights   09/12/21 0851  Weight: 173 lb 3.2 oz (78.6 kg)    GENERAL:alert, no distress and comfortable  NEURO: alert & oriented x 3 with fluent speech, no focal motor/sensory deficits  LABORATORY DATA:  I have reviewed the data as listed    Component Value Date/Time   NA 141 09/05/2021 0909   NA 132 (L) 06/18/2020 0818   NA 140 08/18/2016 0953   K 4.0 09/05/2021 0909   K 3.5 08/18/2016 0953   CL 107 09/05/2021 0909   CO2 23 09/05/2021 0909   CO2 26 08/18/2016 0953   GLUCOSE 123 (H) 09/05/2021 0909   GLUCOSE 116 08/18/2016 0953   BUN 19 09/05/2021 0909   BUN 25 06/18/2020 0818   BUN 17.2 08/18/2016 0953   CREATININE 1.14 09/05/2021 0909   CREATININE 1.1 08/18/2016 0953   CALCIUM 9.6 09/05/2021 0909   CALCIUM 9.2 08/18/2016 0953   PROT 7.6 09/05/2021 0909   PROT 6.9 05/27/2020 1239   PROT 7.6 08/18/2016 0953   ALBUMIN 4.4 09/05/2021 0909   ALBUMIN 4.4 05/27/2020 1239   ALBUMIN 3.9 08/18/2016 0953   AST 31  09/05/2021 0909   AST 21 08/18/2016 0953   ALT 41 09/05/2021 0909   ALT 24 08/18/2016 0953   ALKPHOS 77 09/05/2021 0909   ALKPHOS 59 08/18/2016 0953   BILITOT 0.7 09/05/2021 0909   BILITOT 0.4 05/27/2020 1239   BILITOT 0.95 08/18/2016 0953   GFRNONAA >60 09/05/2021 0909   GFRAA 44 (L) 09/06/2020 1050    No results found for: SPEP, UPEP  Lab Results  Component Value Date   WBC 7.9 09/05/2021   NEUTROABS 4.0 09/05/2021   HGB 14.9 09/05/2021   HCT 42.4 09/05/2021   MCV 85.8 09/05/2021   PLT 237 09/05/2021      Chemistry      Component Value Date/Time   NA 141 09/05/2021 0909   NA 132 (  L) 06/18/2020 0818   NA 140 08/18/2016 0953   K 4.0 09/05/2021 0909   K 3.5 08/18/2016 0953   CL 107 09/05/2021 0909   CO2 23 09/05/2021 0909   CO2 26 08/18/2016 0953   BUN 19 09/05/2021 0909   BUN 25 06/18/2020 0818   BUN 17.2 08/18/2016 0953   CREATININE 1.14 09/05/2021 0909   CREATININE 1.1 08/18/2016 0953   GLU 102 08/02/2015 0000      Component Value Date/Time   CALCIUM 9.6 09/05/2021 0909   CALCIUM 9.2 08/18/2016 0953   ALKPHOS 77 09/05/2021 0909   ALKPHOS 59 08/18/2016 0953   AST 31 09/05/2021 0909   AST 21 08/18/2016 0953   ALT 41 09/05/2021 0909   ALT 24 08/18/2016 0953   BILITOT 0.7 09/05/2021 0909   BILITOT 0.4 05/27/2020 1239   BILITOT 0.95 08/18/2016 7915

## 2021-09-29 ENCOUNTER — Other Ambulatory Visit: Payer: Self-pay | Admitting: Internal Medicine

## 2021-09-29 DIAGNOSIS — I10 Essential (primary) hypertension: Secondary | ICD-10-CM

## 2021-10-31 ENCOUNTER — Other Ambulatory Visit: Payer: Self-pay | Admitting: Internal Medicine

## 2021-10-31 DIAGNOSIS — N401 Enlarged prostate with lower urinary tract symptoms: Secondary | ICD-10-CM

## 2021-11-13 ENCOUNTER — Other Ambulatory Visit: Payer: Self-pay | Admitting: Internal Medicine

## 2021-11-13 DIAGNOSIS — F331 Major depressive disorder, recurrent, moderate: Secondary | ICD-10-CM

## 2021-11-18 DIAGNOSIS — H52223 Regular astigmatism, bilateral: Secondary | ICD-10-CM | POA: Diagnosis not present

## 2021-11-18 DIAGNOSIS — H401131 Primary open-angle glaucoma, bilateral, mild stage: Secondary | ICD-10-CM | POA: Diagnosis not present

## 2021-11-18 DIAGNOSIS — H43813 Vitreous degeneration, bilateral: Secondary | ICD-10-CM | POA: Diagnosis not present

## 2021-11-18 DIAGNOSIS — H2513 Age-related nuclear cataract, bilateral: Secondary | ICD-10-CM | POA: Diagnosis not present

## 2021-11-25 ENCOUNTER — Other Ambulatory Visit: Payer: Self-pay | Admitting: Internal Medicine

## 2021-11-25 ENCOUNTER — Encounter: Payer: Self-pay | Admitting: Internal Medicine

## 2021-11-25 DIAGNOSIS — F331 Major depressive disorder, recurrent, moderate: Secondary | ICD-10-CM

## 2021-11-26 ENCOUNTER — Other Ambulatory Visit: Payer: Self-pay | Admitting: Internal Medicine

## 2021-11-26 DIAGNOSIS — F900 Attention-deficit hyperactivity disorder, predominantly inattentive type: Secondary | ICD-10-CM

## 2021-11-26 MED ORDER — METHYLPHENIDATE HCL ER 20 MG PO TBCR: 20.0000 mg | EXTENDED_RELEASE_TABLET | Freq: Every day | ORAL | 0 refills | Status: DC

## 2021-11-28 ENCOUNTER — Encounter: Payer: Self-pay | Admitting: Internal Medicine

## 2021-12-03 ENCOUNTER — Other Ambulatory Visit: Payer: Self-pay

## 2021-12-03 ENCOUNTER — Encounter: Payer: Self-pay | Admitting: Internal Medicine

## 2021-12-03 ENCOUNTER — Ambulatory Visit (INDEPENDENT_AMBULATORY_CARE_PROVIDER_SITE_OTHER): Payer: PPO | Admitting: Internal Medicine

## 2021-12-03 VITALS — BP 132/84 | HR 75 | Temp 98.3°F | Ht 64.0 in | Wt 171.0 lb

## 2021-12-03 DIAGNOSIS — K439 Ventral hernia without obstruction or gangrene: Secondary | ICD-10-CM

## 2021-12-03 DIAGNOSIS — I10 Essential (primary) hypertension: Secondary | ICD-10-CM | POA: Diagnosis not present

## 2021-12-03 NOTE — Progress Notes (Signed)
Subjective:  Patient ID: Robert Mccall, male    DOB: 1951-01-02  Age: 70 y.o. MRN: [id number]  CC: Follow-up  This visit occurred during the SARS-CoV-2 public health emergency.  Safety protocols were in place, including screening questions prior to the visit, additional usage of staff PPE, and extensive cleaning of exam room while observing appropriate contact time as indicated for disinfecting solutions.    HPI Robert Mccall presents for f/up -  He needs to have a form completed for him to undergo cool sculpting around his abdomen.  On his preop visit they noted that he has a ventral abdominal hernia.  They are asking that a form be signed but it states that if he has a ventral abdominal hernia the procedure is contraindicated.  He denies abdominal pain, nausea, vomiting, odynophagia, or dysphagia.  He has chronic chest pain that he describes as an achy sensation at rest.  Outpatient Medications Prior to Visit  Medication Sig Dispense Refill   alfuzosin (UROXATRAL) 10 MG 24 hr tablet TAKE ONE TABLET BY MOUTH ONE TIME DAILY WITH BREAKFAST 90 tablet 0   Cholecalciferol (VITAMIN D) 50 MCG (2000 UT) CAPS Take 1 capsule (2,000 Units total) by mouth daily. 30 capsule 11   DULoxetine (CYMBALTA) 30 MG capsule TAKE 1 CAPSULE BY MOUTH EVERY DAY 90 capsule 1   esomeprazole (NEXIUM) 20 MG capsule Take 20 mg by mouth daily at 12 noon.     finasteride (PROSCAR) 5 MG tablet TAKE 1 TABLET(5 MG) BY MOUTH DAILY 90 tablet 1   Krill Oil 500 MG CAPS Take 1 capsule by mouth daily.     lamoTRIgine (LAMICTAL) 25 MG tablet TAKE ONE TABLET BY MOUTH TWICE DAILY 180 tablet 0   latanoprost (XALATAN) 0.005 % ophthalmic solution Place 1 drop into both eyes at bedtime.     methylphenidate (METADATE ER) 20 MG ER tablet Take 1 tablet (20 mg total) by mouth daily. 90 tablet 0   metoprolol tartrate (LOPRESSOR) 25 MG tablet Take 1.5 tablets (37.5 mg total) by mouth 2 (two) times daily. 270 tablet 1   olmesartan  (BENICAR) 40 MG tablet TAKE 1 TABLET(40 MG) BY MOUTH DAILY 90 tablet 1   Probiotic Product (PROBIOTIC-10 PO) Take 1 capsule by mouth daily.     rosuvastatin (CRESTOR) 20 MG tablet TAKE 1 TABLET(20 MG) BY MOUTH DAILY 90 tablet 2   Testosterone 20.25 MG/ACT (1.62%) GEL APPLY 2 ACTUATIONS ONTO THE SKIN DAILY 75 g 1   timolol (TIMOPTIC) 0.5 % ophthalmic solution INT 1 GTT IN EACH EYE QAM  0   vitamin B-12 (CYANOCOBALAMIN) 1000 MCG tablet Take 1,000 mcg by mouth daily.     No facility-administered medications prior to visit.    ROS Review of Systems  Constitutional:  Negative for diaphoresis, fatigue and unexpected weight change.  HENT: Negative.  Negative for trouble swallowing.   Respiratory:  Negative for cough, chest tightness, shortness of breath and wheezing.   Cardiovascular:  Positive for chest pain. Negative for palpitations and leg swelling.  Gastrointestinal:  Negative for abdominal pain, constipation, diarrhea, nausea and vomiting.  Genitourinary:  Negative for difficulty urinating, testicular pain and urgency.  Musculoskeletal: Negative.   Skin: Negative.   Neurological:  Negative for dizziness, weakness and light-headedness.  Hematological:  Negative for adenopathy. Does not bruise/bleed easily.  Psychiatric/Behavioral: Negative.     Objective:  BP 132/84 (BP Location: Left Arm, Patient Position: Sitting, Cuff Size: Large)    Pulse 75    Temp  98.3 F (36.8 C) (Oral)    Ht 5\' 4"  (1.626 m)    Wt 171 lb (77.6 kg)    SpO2 95%    BMI 29.35 kg/m   BP Readings from Last 3 Encounters:  12/03/21 132/84  09/12/21 138/87  08/28/21 124/86    Wt Readings from Last 3 Encounters:  12/03/21 171 lb (77.6 kg)  09/12/21 173 lb 3.2 oz (78.6 kg)  02/25/21 173 lb (78.5 kg)    Physical Exam Vitals reviewed.  HENT:     Nose: Nose normal.     Mouth/Throat:     Mouth: Mucous membranes are moist.  Eyes:     General: No scleral icterus.    Conjunctiva/sclera: Conjunctivae normal.   Cardiovascular:     Rate and Rhythm: Normal rate and regular rhythm.     Heart sounds: No murmur heard. Pulmonary:     Effort: Pulmonary effort is normal.     Breath sounds: No stridor. No wheezing, rhonchi or rales.  Abdominal:     General: Abdomen is flat. Bowel sounds are normal. There is no distension.     Palpations: Abdomen is soft. There is no hepatomegaly, splenomegaly or mass.     Hernia: A hernia is present. Hernia is present in the ventral area. There is no hernia in the umbilical area, left inguinal area, right femoral area, left femoral area or right inguinal area.     Comments: There is a small, uncomplicated ventral abdominal hernia that is only seen with Valsalva maneuver.    Musculoskeletal:        General: Normal range of motion.     Cervical back: Neck supple.     Right lower leg: No edema.  Lymphadenopathy:     Cervical: No cervical adenopathy.  Skin:    General: Skin is warm and dry.  Neurological:     General: No focal deficit present.     Mental Status: He is alert.    Lab Results  Component Value Date   WBC 7.9 09/05/2021   HGB 14.9 09/05/2021   HCT 42.4 09/05/2021   PLT 237 09/05/2021   GLUCOSE 123 (H) 09/05/2021   CHOL 145 02/25/2021   TRIG 185.0 (H) 02/25/2021   HDL 44.00 02/25/2021   LDLDIRECT 76.0 11/29/2019   LDLCALC 64 02/25/2021   ALT 41 09/05/2021   AST 31 09/05/2021   NA 141 09/05/2021   K 4.0 09/05/2021   CL 107 09/05/2021   CREATININE 1.14 09/05/2021   BUN 19 09/05/2021   CO2 23 09/05/2021   TSH 1.90 02/25/2021   PSA 0.91 02/25/2021   INR 1.04 01/23/2017   HGBA1C 6.1 08/28/2021   MICROALBUR 5.3 (H) 02/25/2021    No results found.  Assessment & Plan:   Skyler was seen today for follow-up.  Diagnoses and all orders for this visit:  Ventral hernia without obstruction or gangrene-  He has an uncomplicated ventral abdominal hernia. I recommended that he not consider surgery for this since it is small and uncomplicated.  I have  completed his form indicating that cool sculpting is contraindicated.  Essential hypertension- His BP is well controlled.   I am having Hardin Negus. [place] maintain his vitamin B-12, Testosterone, timolol, latanoprost, esomeprazole, Krill Oil, Probiotic Product (PROBIOTIC-10 PO), DULoxetine, rosuvastatin, finasteride, metoprolol tartrate, Vitamin D, olmesartan, alfuzosin, lamoTRIgine, and methylphenidate.  No orders of the defined types were placed in this encounter.    Follow-up: Return in about 3 months (around 03/03/2022).  Scarlette Calico, MD

## 2021-12-03 NOTE — Patient Instructions (Signed)
Hernia, Adult   A hernia is the bulging of an organ or tissue through a weak spot in the muscles of the abdomen. Hernias develop most often near the belly button (navel) or the area where the leg meets the lower abdomen (groin). Common types of hernias include: Incisional hernia. This type bulges through a scar from an abdominal surgery. Umbilical hernia. This type develops near the navel. Inguinal hernia. This type develops in the groin or scrotum. Femoral hernia. This type develops below the groin, in the upper thigh area. Hiatal hernia. This type occurs when part of the stomach slides above the muscle that separates the abdomen from the chest (diaphragm). What are the causes? This condition may be caused by: Heavy lifting. Coughing over a long period of time. Straining to have a bowel movement. Constipation can lead to straining. An incision made during abdominal surgery. A physical problem that is present at birth (congenital defect). Being overweight or obese. Smoking. Excess fluid in the abdomen. Undescended testicles in males. What are the signs or symptoms? The main symptom is a skin-colored, rounded bulge in the area of the hernia. However, a bulge may not always be present. It may grow bigger or be more visible when you cough or strain (such as when lifting something heavy). A hernia that can be pushed back into the abdomen (is reducible) rarely causes pain. A hernia that cannot be pushed back into the abdomen (is incarcerated) may lose its blood supply (become strangulated). A hernia that is incarcerated may cause: Pain. Fever. Nausea and vomiting. Swelling. Constipation. How is this diagnosed? A hernia may be diagnosed based on: Your symptoms and medical history. A physical exam. Your health care provider may ask you to cough or move in certain ways to see if the hernia becomes visible. Imaging tests, such as: X-rays. Ultrasound. CT scan. How is this treated? A hernia  that is small and painless may not need to be treated. A hernia that is large or painful may be treated with surgery. Inguinal hernias may be treated with surgery to prevent incarceration or strangulation. Strangulated hernias are always treated with surgery because the strangulation causes a lack of blood supply to the trapped organ or tissue. Surgery to treat a hernia involves pushing the bulge back into place and repairing the weak area of the muscle or abdominal wall. Follow these instructions at home: Activity Avoid straining. Do not lift anything that is heavier than 10 lb (4.5 kg), or the limit that you are told, until your health care provider says that it is safe. When lifting heavy objects, lift with your leg muscles, not your back muscles. Preventing constipation Take actions to prevent constipation. Constipation leads to straining with bowel movements, which can make a hernia worse or cause a hernia repair to break down. Your health care provider may recommend that you take these actions to prevent or treat constipation: Drink enough fluid to keep your urine pale yellow. Take over-the-counter or prescription medicines. Eat foods that are high in fiber, such as beans, whole grains, and fresh fruits and vegetables. Limit foods that are high in fat and processed sugars, such as fried or sweet foods. General instructions When coughing, try to cough gently. You may try to push the hernia back in place by very gently pressing on it while lying down. Do not try to force the bulge back in if it will not push in easily. If you are overweight, work with your health care provider to lose  weight safely. Do not use any products that contain nicotine or tobacco. These products include cigarettes, chewing tobacco, and vaping devices, such as e-cigarettes. If you need help quitting, ask your health care provider. If you are scheduled for hernia repair, watch your hernia for any changes in shape, size,  or color. Tell your health care provider about any changes or new symptoms. Take over-the-counter and prescription medicines only as told by your health care provider. Keep all follow-up visits. This is important. Contact a health care provider if: You develop new pain, swelling, or redness around your hernia. You have signs of constipation, such as: Fewer bowel movements in a week than normal. Difficulty having a bowel movement. Stools that are dry, hard, or larger than normal. Get help right away if: You have a fever or chills. You have abdominal pain that gets worse. You feel nauseous or you vomit. You cannot push the hernia back in place by very gently pressing on it while lying down. Do not try to force the bulge back in if it will not go in easily. The hernia: Changes in shape, size, or color. Feels hard or tender. These symptoms may represent a serious problem that is an emergency. Do not wait to see if the symptoms will go away. Get medical help right away. Call your local emergency services (911 in the [place] Do not drive yourself to the hospital. Summary A hernia is the bulging of an organ or tissue through a weak spot in the muscles of the abdomen. The main symptom is a skin-colored bulge in the hernia area. However, a bulge may not always be present. It may grow bigger or more visible when you cough or strain (such as when having a bowel movement). A hernia that is small and painless may not need to be treated. A hernia that is large or painful may be treated with surgery. Surgery to treat a hernia involves pushing the bulge back into place and repairing the weak part of the abdomen. This information is not intended to replace advice given to you by your health care provider. Make sure you discuss any questions you have with your health care provider. Document Revised: 07/01/2020 Document Reviewed: 07/01/2020 Elsevier Patient Education  [place].

## 2021-12-09 ENCOUNTER — Encounter: Payer: Self-pay | Admitting: Internal Medicine

## 2021-12-15 ENCOUNTER — Encounter: Payer: Self-pay | Admitting: Cardiovascular Disease

## 2021-12-15 ENCOUNTER — Telehealth: Payer: Self-pay | Admitting: Cardiovascular Disease

## 2021-12-15 DIAGNOSIS — I209 Angina pectoris, unspecified: Secondary | ICD-10-CM

## 2021-12-15 MED ORDER — NITROGLYCERIN 0.4 MG SL SUBL: 0.4000 mg | SUBLINGUAL_TABLET | SUBLINGUAL | 3 refills | Status: DC | PRN

## 2021-12-15 NOTE — Telephone Encounter (Signed)
Pt is returning call to Triage

## 2021-12-15 NOTE — Telephone Encounter (Signed)
See below message from Dr Acie Fredrickson. Order has been placed for NTG and appointment scheduled with him on 1/16 (first available-no APP appt open at this time). Pt unable to be seen until after 1/13 due to job. Called pt to make him aware. Wrenly Lauritsen, RN  Nahser, Wonda Cheng, MD  P [facility] Triage; Ma Hillock, RN Caller: Unspecified (Today,  3:36 PM) I have called and talked to [facility].  He has been having stress for the past many months.   these episodes can last for days at a time.  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  He thinks that it is likely that this chest pain is related to stress.   He has had chest pains in the past and coronary CT angiogram is revealed a 50% proximal LAD stenosis.  I cannot completely rule out coronary artery disease.  Please send him a prescription for nitroglycerin 0.4 mg to take sublingually as needed for chest discomfort.  Please see if he can be worked in to a visit with an APP in the next several days or I will see him on January 12 at 10 AM as a work in visit.  He will need an EKG.   Thank you   PN

## 2021-12-15 NOTE — Telephone Encounter (Signed)
Returned call to patient following his MyChart message this morning r/t chest pain. Pt states that he has been experiencing achiness in his chest which he describes as central and radiating outwards to the L and R. Pt endorses that the pain has been present for 4-5 months, but is progressively worsening in nature and occurring more frequently. Pt denies pain to shoulder, jaw, arm, etc. Pt condones SOB occasionally with the pain, but denies nausea/vomiting/diaphoresis. Pt states that he has experienced a headache recently, but has not been checking his BP at home. Pt denies weight gain, and states he has most recently, lost weight. Pt states that the pain seems to be worse when up and moving around during daily activities, but unable to verify if anything he does actually improves the pain. Pt states he is still on all medications and hasn't made any other changes to daily routine. This RN will forward information to Dr. Acie Fredrickson for recommendation. Judson Roch, RN

## 2021-12-15 NOTE — Telephone Encounter (Signed)
Attempted to call patient in regards to his My Chart message on experiencing chest pain. Left voicemail to have pt call the office at his convenience and also sent My Chart reply. Judson Roch, RN

## 2021-12-15 NOTE — Telephone Encounter (Signed)
See above phone note.  

## 2021-12-18 ENCOUNTER — Ambulatory Visit: Payer: PPO | Admitting: Cardiovascular Disease

## 2021-12-21 ENCOUNTER — Encounter: Payer: Self-pay | Admitting: Cardiovascular Disease

## 2021-12-21 NOTE — H&P (View-Only) (Signed)
Cardiology Office Note:    Date:  12/22/2021   ID:  Robert Mccall, DOB 09-Jun-1951, MRN [id number]  PCP:  [name], MD  Cardiologist:  Mertie Moores, MD   Referring MD: [name], MD   Problem List 1. TIA 2. Chronic diastolic CHF 3. HTN 4. Hyperlipidemia    Chief Complaint  Patient presents with   Congestive Heart Failure    April 04, 2018:     Robert Mccall is a 71 y.o. male with a hx of hypertension, hyperlipidemia,  Chronic diastolic CHF  Resents for further evaluation of chest discomfort.  Tracks his BP regularly ,  BP cuff will alert him if his heart rate is irregular.  He can feel these palpitations.  They occur spontaneously and might last for as long as 15 to 20 minutes.  They typically resolve spontaneously.  These are not associated with any specific activity.  They are not associated with any particular time a day.  Does not do any regular exercise .   Works in [employer].   Oct. 23, 2019:  Has been having trouble with his hearing that she has Mnire's disease.  He denies any episodes of chest pain or shortness of breath.  Blood pressure and heart rate are well controlled today.  Lipids from last week look good on Crestor 10 a day   Nov. 2, 2020 Robert Mccall is seen today for follow up of his HTN and chronic diastolic CHF No cp, no dyspnea.   Not exercising  Much.   Works at [employer].   May 27, 2020: Robert Mccall is seen today for follow-up for his hypertension and chronic diastolic congestive heart failure. No CP or dyspnea.   He is very hard of hearing  -    We ordered a coronary CT angiogram.  Coronary calcium score of 523. This was 76th percentile for age and sex matched control. Coronary angiogram revealed moderate disease in the proximal/mid LAD. There was concern for possible obstruction just distal to the first diagonal at the level of the second diagonal.  There is no disease in the LAD distal to the second  diagonal.  FFR did not show any significant obstructive disease.   We discussed the importance of lipid management.  His most recent labs are from December, 2020.  His LDL is 76.  The triglyceride level is 299.  The total cholesterol is 157.  The HDL is 35.7.  No angina .   Has some CP at night when he is lying down.    Is not getting regular exercise - lots of back issues.    Dec. 13, 2021: Robert Mccall is seen today for follow up of his moderate CAD, chronic diastolic CHF Has coronary calcium score is 523 Had a random CP one night.  Likely was indigestion    Jan. 16, 2023 Robert Mccall is seen today for follow up of his CAD, chronic diastolic CHF He has been having chest pain  - may be related to stress Coronary CT angio from May, 2021 shows a CAC score of 523. 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  Is not exercising  CP does not worsen with exertion - does not do much, limited by back issues.  Radiates through to his back Tried  NTG 1 day - did not relieve his CP .   I considered myoview study -  he has known moderate disease so im not sure that this would be beneficial     Past Medical History:  Diagnosis Date   ADHD (attention deficit hyperactivity disorder)    Anxiety    Benign prostatic hypertrophy    Chronic lower back pain    surgery schedule for back 10/21/16   Cyst of right kidney    incidental [place] 1.3cm on R (MRI abd 09/2014), follows with uro   Depression    GERD (gastroesophageal reflux disease)    Glaucoma    Hard of hearing    History of migraine headaches    no meds   Hyperlipidemia    Hypertension    Menetrier's disease     Past Surgical History:  Procedure Laterality Date   COLONOSCOPY  09/2007   polyps/ [place]  10/21/2016   ORCHIECTOMY  Left 1970   WISDOM TOOTH EXTRACTION      Current Medications: Current Meds  Medication Sig   alfuzosin (UROXATRAL) 10 MG 24 hr tablet TAKE ONE TABLET BY MOUTH ONE TIME DAILY WITH BREAKFAST   aspirin EC 81 MG tablet Take 1 tablet (81 mg total) by mouth daily. Swallow whole.   Cholecalciferol (VITAMIN D) 50 MCG (2000 UT) CAPS Take 1 capsule (2,000 Units total) by mouth daily.   DULoxetine (CYMBALTA) 30 MG capsule TAKE 1 CAPSULE BY MOUTH EVERY DAY   esomeprazole (NEXIUM) 20 MG capsule Take 20 mg by mouth daily at 12 noon.   finasteride (PROSCAR) 5 MG tablet TAKE 1 TABLET(5 MG) BY MOUTH DAILY   Krill Oil 500 MG CAPS Take 1 capsule by mouth daily.   lamoTRIgine (LAMICTAL) 25 MG tablet TAKE ONE TABLET BY MOUTH TWICE DAILY   latanoprost (XALATAN) 0.005 % ophthalmic solution Place 1 drop into both eyes at bedtime.   methylphenidate (METADATE ER) 20 MG ER tablet Take 1 tablet (20 mg total) by mouth daily.   metoprolol tartrate (LOPRESSOR) 25 MG tablet Take 1.5 tablets (37.5 mg total) by mouth 2 (two) times daily.   nitroGLYCERIN (NITROSTAT) 0.4 MG SL tablet Place 1 tablet (0.4 mg total) under the tongue every 5 (five) minutes as needed for chest pain (Call 911 if 3 doses does not relieve pain.).   olmesartan (BENICAR) 40 MG tablet TAKE 1 TABLET(40 MG) BY MOUTH DAILY   Probiotic Product (PROBIOTIC-10 PO) Take 1 capsule by mouth daily.   rosuvastatin (CRESTOR) 20 MG tablet TAKE 1 TABLET(20 MG) BY MOUTH DAILY   Testosterone 20.25 MG/ACT (1.62%) GEL APPLY 2 ACTUATIONS ONTO THE SKIN DAILY   timolol (TIMOPTIC) 0.5 % ophthalmic solution INT 1 GTT IN EACH EYE QAM   vitamin B-12 (CYANOCOBALAMIN) 1000 MCG tablet Take 1,000 mcg by mouth daily.     Allergies:   Patient has no known allergies.   Social History   Socioeconomic History   Marital status: Single    Spouse name: Not on file   Number of children: Not on file   Years of education: Not on file   Highest education level: Not on file   Occupational History   Occupation: Freight forwarder    Comment: [employer]  Tobacco Use   Smoking status: Never   Smokeless tobacco: Never  Vaping Use   Vaping Use: Never used  Substance and Sexual Activity   Alcohol use: Yes  Comment: occ   Drug use: No   Sexual activity: Yes    Comment: working with replacement  Other Topics Concern   Not on file  Social History Narrative   Not on file   Social Determinants of Health   Financial Resource Strain: Low Risk    Difficulty of Paying Living Expenses: Not hard at all  Food Insecurity: Not on file  Transportation Needs: Not on file  Physical Activity: Not on file  Stress: Not on file  Social Connections: Not on file     Family History: The patient's family history includes Breast cancer in his maternal grandmother; Breast cancer (age of onset: 38) in his mother; COPD in his mother; Cancer in his father; Depression in his mother; Diabetes in his father; Heart disease in his mother; Macular degeneration in his father; Osteoarthritis in his mother. There is no history of Colon cancer, Esophageal cancer, Rectal cancer, or Stomach cancer.  ROS:   Please see the history of present illness.     All other systems reviewed and are negative.  EKGs/Labs/Other Studies Reviewed:    The following studies were reviewed today:    Recent Labs: 02/25/2021: TSH 1.90 08/28/2021: Pro B Natriuretic peptide (BNP) 24.0 09/05/2021: ALT 41; BUN 19; Creatinine, Ser 1.14; Hemoglobin 14.9; Platelets 237; Potassium 4.0; Sodium 141  Recent Lipid Panel    Component Value Date/Time   CHOL 145 02/25/2021 1435   CHOL 107 05/27/2020 1239   TRIG 185.0 (H) 02/25/2021 1435   HDL 44.00 02/25/2021 1435   HDL 31 (L) 05/27/2020 1239   CHOLHDL 3 02/25/2021 1435   VLDL 37.0 02/25/2021 1435   LDLCALC 64 02/25/2021 1435   LDLCALC 52 05/27/2020 1239   LDLDIRECT 76.0 11/29/2019 0901    Physical Exam: Blood pressure 138/72, pulse 65, height 5\' 4"   (1.626 m), weight 170 lb 12.8 oz (77.5 kg), SpO2 98 %.  GEN:  Well nourished, well developed in no acute distress HEENT: Normal NECK: No JVD; No carotid bruits LYMPHATICS: No lymphadenopathy CARDIAC: RRR , no murmurs, rubs, gallops RESPIRATORY:  Clear to auscultation without rales, wheezing or rhonchi  ABDOMEN: Soft, non-tender, non-distended MUSCULOSKELETAL:  No edema; No deformity  SKIN: Warm and dry NEUROLOGIC:  Alert and oriented x 3    EKG:    December 22, 2021: Normal sinus rhythm at 65.  No ST or T wave changes.   ASSESSMENT:    1. Unstable angina (HCC)     PLAN:     1. Hypertension-         BP is well controlled.   3.  Unstable angina :  he has developed CP, radiation to his intrascapular region. Not necessarliy exertional but he does not do any exercise .  Has already had a coronary CT angiogram in May, 2021.  I do not think that a Myoview study would be of any benefit since he has known moderate CAD.  We will schedule him for heart catheterization.  We discussed the risk, benefits, options of heart catheterization.  He understands and agrees to proceed.       4.   Chronic diastolic CHF:     stable        Medication Adjustments/Labs and Tests Ordered: Current medicines are reviewed at length with the patient today.  Concerns regarding medicines are outlined above.  Orders Placed This Encounter  Procedures   Basic Metabolic Panel (BMET)   CBC   EKG 12-Lead   Meds ordered this encounter  Medications  aspirin EC 81 MG tablet    Sig: Take 1 tablet (81 mg total) by mouth daily. Swallow whole.    Dispense:  90 tablet    Refill:  3     Patient Instructions  Medication Instructions:   START Asprin one (1) tablet by mouth ( 81 mg) daily.   *If you need a refill on your cardiac medications before your next appointment, please call your pharmacy*   Lab Work:  Ordered for Jan 27.   If you have labs (blood work) drawn today and your tests are  completely normal, you will receive your results only by: [place] (if you have MyChart) OR A paper copy in the mail If you have any lab test that is abnormal or we need to change your treatment, we will call you to review the results.   Testing/Procedures:   [facility] OFFICE [place], [place] [address] Dept: [phone] Loc: [phone]  Eliya Geiman  12/22/2021  You are scheduled for a Cardiac Catheterization on Wednesday, February 1 with Dr. Peter Martinique.  1. Please arrive at the [facility] (Main Entrance A) at [facility]: [address] [place], [place] [address] at 6:30 AM (This time is two hours before your procedure to ensure your preparation). Free valet parking service is available.   Special note: Every effort is made to have your procedure done on time. Please understand that emergencies sometimes delay scheduled procedures.  2. Diet: Do not eat solid foods after midnight.  The patient may have clear liquids until 5am upon the day of the procedure.  3.  Your physician recommends that you return for lab work one Friday, January 27.  You can come in on the day of your appointment anytime between 7:30-4:30.   4. Medication instructions in preparation for your procedure:   Contrast Allergy: No   On the morning of your procedure, take your Aspirin and any morning medicines NOT listed above.  You may use sips of water.  5. Plan for one night stay--bring personal belongings. 6. Bring a current list of your medications and current insurance cards. 7. You MUST have a responsible person to drive you home. 8. Someone MUST be with you the first 24 hours after you arrive home or your discharge will be delayed. 9. Please wear clothes that are easy to get on and off and wear slip-on shoes.  Thank you for allowing [place] to care for you!   -- [place]  Invasive Cardiovascular services    Follow-Up: At [facility], you and your health needs are our priority.  As part of our continuing mission to provide you with exceptional heart care, we have created designated Provider Care Teams.  These Care Teams include your primary Cardiologist (physician) and Advanced Practice Providers (APPs -  Physician Assistants and Nurse Practitioners) who all work together to provide you with the care you need, when you need it.  We recommend signing up for the patient portal called "MyChart".  Sign up information is provided on this After Visit Summary.  MyChart is used to connect with patients for Virtual Visits (Telemedicine).  Patients are able to view lab/test results, encounter notes, upcoming appointments, etc.  Non-urgent messages can be sent to your provider as well.   To learn more about what you can do with MyChart, go to [email or web address].    Your next appointment:   2 month(s)  The format for your next appointment:   In Person  Provider:   Mertie Moores, MD     Other Instructions     Signed, Mertie Moores, MD  12/22/2021 5:50 PM    [place]

## 2021-12-21 NOTE — Progress Notes (Signed)
Cardiology Office Note:    Date:  12/22/2021   ID:  Robert Mccall, DOB 1951-04-12, MRN [id number]  PCP:  [name], MD  Cardiologist:  Mertie Moores, MD   Referring MD: [name], MD   Problem List 1. TIA 2. Chronic diastolic CHF 3. HTN 4. Hyperlipidemia    Chief Complaint  Patient presents with   Congestive Heart Failure    April 04, 2018:     Robert Mccall is a 71 y.o. male with a hx of hypertension, hyperlipidemia,  Chronic diastolic CHF  Resents for further evaluation of chest discomfort.  Tracks his BP regularly ,  BP cuff will alert him if his heart rate is irregular.  He can feel these palpitations.  They occur spontaneously and might last for as long as 15 to 20 minutes.  They typically resolve spontaneously.  These are not associated with any specific activity.  They are not associated with any particular time a day.  Does not do any regular exercise .   Works in [employer].   Oct. 23, 2019:  Has been having trouble with his hearing that she has Mnire's disease.  He denies any episodes of chest pain or shortness of breath.  Blood pressure and heart rate are well controlled today.  Lipids from last week look good on Crestor 10 a day   Nov. 2, 2020 Dwon is seen today for follow up of his HTN and chronic diastolic CHF No cp, no dyspnea.   Not exercising  Much.   Works at [employer].   May 27, 2020: Robert Mccall is seen today for follow-up for his hypertension and chronic diastolic congestive heart failure. No CP or dyspnea.   He is very hard of hearing  -    We ordered a coronary CT angiogram.  Coronary calcium score of 523. This was 76th percentile for age and sex matched control. Coronary angiogram revealed moderate disease in the proximal/mid LAD. There was concern for possible obstruction just distal to the first diagonal at the level of the second diagonal.  There is no disease in the LAD distal to the second  diagonal.  FFR did not show any significant obstructive disease.   We discussed the importance of lipid management.  His most recent labs are from December, 2020.  His LDL is 76.  The triglyceride level is 299.  The total cholesterol is 157.  The HDL is 35.7.  No angina .   Has some CP at night when he is lying down.    Is not getting regular exercise - lots of back issues.    Dec. 13, 2021: Robert Mccall is seen today for follow up of his moderate CAD, chronic diastolic CHF Has coronary calcium score is 523 Had a random CP one night.  Likely was indigestion    Jan. 16, 2023 Robert Mccall is seen today for follow up of his CAD, chronic diastolic CHF He has been having chest pain  - may be related to stress Coronary CT angio from May, 2021 shows a CAC score of 523. 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  Is not exercising  CP does not worsen with exertion - does not do much, limited by back issues.  Radiates through to his back Tried  NTG 1 day - did not relieve his CP .   I considered myoview study -  he has known moderate disease so im not sure that this would be beneficial     Past Medical History:  Diagnosis Date   ADHD (attention deficit hyperactivity disorder)    Anxiety    Benign prostatic hypertrophy    Chronic lower back pain    surgery schedule for back 10/21/16   Cyst of right kidney    incidental [place] 1.3cm on R (MRI abd 09/2014), follows with uro   Depression    GERD (gastroesophageal reflux disease)    Glaucoma    Hard of hearing    History of migraine headaches    no meds   Hyperlipidemia    Hypertension    Menetrier's disease     Past Surgical History:  Procedure Laterality Date   COLONOSCOPY  09/2007   polyps/ [place]  10/21/2016   ORCHIECTOMY  Left 1970   WISDOM TOOTH EXTRACTION      Current Medications: Current Meds  Medication Sig   alfuzosin (UROXATRAL) 10 MG 24 hr tablet TAKE ONE TABLET BY MOUTH ONE TIME DAILY WITH BREAKFAST   aspirin EC 81 MG tablet Take 1 tablet (81 mg total) by mouth daily. Swallow whole.   Cholecalciferol (VITAMIN D) 50 MCG (2000 UT) CAPS Take 1 capsule (2,000 Units total) by mouth daily.   DULoxetine (CYMBALTA) 30 MG capsule TAKE 1 CAPSULE BY MOUTH EVERY DAY   esomeprazole (NEXIUM) 20 MG capsule Take 20 mg by mouth daily at 12 noon.   finasteride (PROSCAR) 5 MG tablet TAKE 1 TABLET(5 MG) BY MOUTH DAILY   Krill Oil 500 MG CAPS Take 1 capsule by mouth daily.   lamoTRIgine (LAMICTAL) 25 MG tablet TAKE ONE TABLET BY MOUTH TWICE DAILY   latanoprost (XALATAN) 0.005 % ophthalmic solution Place 1 drop into both eyes at bedtime.   methylphenidate (METADATE ER) 20 MG ER tablet Take 1 tablet (20 mg total) by mouth daily.   metoprolol tartrate (LOPRESSOR) 25 MG tablet Take 1.5 tablets (37.5 mg total) by mouth 2 (two) times daily.   nitroGLYCERIN (NITROSTAT) 0.4 MG SL tablet Place 1 tablet (0.4 mg total) under the tongue every 5 (five) minutes as needed for chest pain (Call 911 if 3 doses does not relieve pain.).   olmesartan (BENICAR) 40 MG tablet TAKE 1 TABLET(40 MG) BY MOUTH DAILY   Probiotic Product (PROBIOTIC-10 PO) Take 1 capsule by mouth daily.   rosuvastatin (CRESTOR) 20 MG tablet TAKE 1 TABLET(20 MG) BY MOUTH DAILY   Testosterone 20.25 MG/ACT (1.62%) GEL APPLY 2 ACTUATIONS ONTO THE SKIN DAILY   timolol (TIMOPTIC) 0.5 % ophthalmic solution INT 1 GTT IN EACH EYE QAM   vitamin B-12 (CYANOCOBALAMIN) 1000 MCG tablet Take 1,000 mcg by mouth daily.     Allergies:   Patient has no known allergies.   Social History   Socioeconomic History   Marital status: Single    Spouse name: Not on file   Number of children: Not on file   Years of education: Not on file   Highest education level: Not on file   Occupational History   Occupation: Freight forwarder    Comment: [employer]  Tobacco Use   Smoking status: Never   Smokeless tobacco: Never  Vaping Use   Vaping Use: Never used  Substance and Sexual Activity   Alcohol use: Yes  Comment: occ   Drug use: No   Sexual activity: Yes    Comment: working with replacement  Other Topics Concern   Not on file  Social History Narrative   Not on file   Social Determinants of Health   Financial Resource Strain: Low Risk    Difficulty of Paying Living Expenses: Not hard at all  Food Insecurity: Not on file  Transportation Needs: Not on file  Physical Activity: Not on file  Stress: Not on file  Social Connections: Not on file     Family History: The patient's family history includes Breast cancer in his maternal grandmother; Breast cancer (age of onset: 37) in his mother; COPD in his mother; Cancer in his father; Depression in his mother; Diabetes in his father; Heart disease in his mother; Macular degeneration in his father; Osteoarthritis in his mother. There is no history of Colon cancer, Esophageal cancer, Rectal cancer, or Stomach cancer.  ROS:   Please see the history of present illness.     All other systems reviewed and are negative.  EKGs/Labs/Other Studies Reviewed:    The following studies were reviewed today:    Recent Labs: 02/25/2021: TSH 1.90 08/28/2021: Pro B Natriuretic peptide (BNP) 24.0 09/05/2021: ALT 41; BUN 19; Creatinine, Ser 1.14; Hemoglobin 14.9; Platelets 237; Potassium 4.0; Sodium 141  Recent Lipid Panel    Component Value Date/Time   CHOL 145 02/25/2021 1435   CHOL 107 05/27/2020 1239   TRIG 185.0 (H) 02/25/2021 1435   HDL 44.00 02/25/2021 1435   HDL 31 (L) 05/27/2020 1239   CHOLHDL 3 02/25/2021 1435   VLDL 37.0 02/25/2021 1435   LDLCALC 64 02/25/2021 1435   LDLCALC 52 05/27/2020 1239   LDLDIRECT 76.0 11/29/2019 0901    Physical Exam: Blood pressure 138/72, pulse 65, height 5\' 4"   (1.626 m), weight 170 lb 12.8 oz (77.5 kg), SpO2 98 %.  GEN:  Well nourished, well developed in no acute distress HEENT: Normal NECK: No JVD; No carotid bruits LYMPHATICS: No lymphadenopathy CARDIAC: RRR , no murmurs, rubs, gallops RESPIRATORY:  Clear to auscultation without rales, wheezing or rhonchi  ABDOMEN: Soft, non-tender, non-distended MUSCULOSKELETAL:  No edema; No deformity  SKIN: Warm and dry NEUROLOGIC:  Alert and oriented x 3    EKG:    December 22, 2021: Normal sinus rhythm at 65.  No ST or T wave changes.   ASSESSMENT:    1. Unstable angina (HCC)     PLAN:     1. Hypertension-         BP is well controlled.   3.  Unstable angina :  he has developed CP, radiation to his intrascapular region. Not necessarliy exertional but he does not do any exercise .  Has already had a coronary CT angiogram in May, 2021.  I do not think that a Myoview study would be of any benefit since he has known moderate CAD.  We will schedule him for heart catheterization.  We discussed the risk, benefits, options of heart catheterization.  He understands and agrees to proceed.       4.   Chronic diastolic CHF:     stable        Medication Adjustments/Labs and Tests Ordered: Current medicines are reviewed at length with the patient today.  Concerns regarding medicines are outlined above.  Orders Placed This Encounter  Procedures   Basic Metabolic Panel (BMET)   CBC   EKG 12-Lead   Meds ordered this encounter  Medications  aspirin EC 81 MG tablet    Sig: Take 1 tablet (81 mg total) by mouth daily. Swallow whole.    Dispense:  90 tablet    Refill:  3     Patient Instructions  Medication Instructions:   START Asprin one (1) tablet by mouth ( 81 mg) daily.   *If you need a refill on your cardiac medications before your next appointment, please call your pharmacy*   Lab Work:  Ordered for Jan 27.   If you have labs (blood work) drawn today and your tests are  completely normal, you will receive your results only by: [place] (if you have MyChart) OR A paper copy in the mail If you have any lab test that is abnormal or we need to change your treatment, we will call you to review the results.   Testing/Procedures:   [facility] OFFICE [place], [place] [address] Dept: [phone] Loc: [phone]  Mcadoo Muzquiz  12/22/2021  You are scheduled for a Cardiac Catheterization on Wednesday, February 1 with Dr. Peter Martinique.  1. Please arrive at the [facility] (Main Entrance A) at [facility]: [address] [place], [place] [address] at 6:30 AM (This time is two hours before your procedure to ensure your preparation). Free valet parking service is available.   Special note: Every effort is made to have your procedure done on time. Please understand that emergencies sometimes delay scheduled procedures.  2. Diet: Do not eat solid foods after midnight.  The patient may have clear liquids until 5am upon the day of the procedure.  3.  Your physician recommends that you return for lab work one Friday, January 27.  You can come in on the day of your appointment anytime between 7:30-4:30.   4. Medication instructions in preparation for your procedure:   Contrast Allergy: No   On the morning of your procedure, take your Aspirin and any morning medicines NOT listed above.  You may use sips of water.  5. Plan for one night stay--bring personal belongings. 6. Bring a current list of your medications and current insurance cards. 7. You MUST have a responsible person to drive you home. 8. Someone MUST be with you the first 24 hours after you arrive home or your discharge will be delayed. 9. Please wear clothes that are easy to get on and off and wear slip-on shoes.  Thank you for allowing [place] to care for you!   -- [place]  Invasive Cardiovascular services    Follow-Up: At [facility], you and your health needs are our priority.  As part of our continuing mission to provide you with exceptional heart care, we have created designated Provider Care Teams.  These Care Teams include your primary Cardiologist (physician) and Advanced Practice Providers (APPs -  Physician Assistants and Nurse Practitioners) who all work together to provide you with the care you need, when you need it.  We recommend signing up for the patient portal called "MyChart".  Sign up information is provided on this After Visit Summary.  MyChart is used to connect with patients for Virtual Visits (Telemedicine).  Patients are able to view lab/test results, encounter notes, upcoming appointments, etc.  Non-urgent messages can be sent to your provider as well.   To learn more about what you can do with MyChart, go to [email or web address].    Your next appointment:   2 month(s)  The format for your next appointment:   In Person  Provider:   Mertie Moores, MD     Other Instructions     Signed, Mertie Moores, MD  12/22/2021 5:50 PM    [place]

## 2021-12-22 ENCOUNTER — Other Ambulatory Visit: Payer: Self-pay

## 2021-12-22 ENCOUNTER — Encounter: Payer: Self-pay | Admitting: Cardiovascular Disease

## 2021-12-22 ENCOUNTER — Ambulatory Visit: Payer: PPO | Admitting: Cardiovascular Disease

## 2021-12-22 VITALS — BP 138/72 | HR 65 | Ht 64.0 in | Wt 170.8 lb

## 2021-12-22 DIAGNOSIS — I200 Unstable angina: Secondary | ICD-10-CM | POA: Diagnosis not present

## 2021-12-22 MED ORDER — ASPIRIN EC 81 MG PO TBEC: 81.0000 mg | DELAYED_RELEASE_TABLET | Freq: Every day | ORAL | 3 refills | Status: DC

## 2021-12-22 NOTE — Patient Instructions (Addendum)
Medication Instructions:   START Asprin one (1) tablet by mouth ( 81 mg) daily.   *If you need a refill on your cardiac medications before your next appointment, please call your pharmacy*   Lab Work:  Ordered for Jan 27.   If you have labs (blood work) drawn today and your tests are completely normal, you will receive your results only by: [place] (if you have MyChart) OR A paper copy in the mail If you have any lab test that is abnormal or we need to change your treatment, we will call you to review the results.   Testing/Procedures:   [facility] OFFICE [place], [place] [address] Dept: [phone] Loc: [phone]  Agnes Probert  12/22/2021  You are scheduled for a Cardiac Catheterization on Wednesday, February 1 with Dr. Peter Martinique.  1. Please arrive at the [facility] (Main Entrance A) at [facility]: [address] [place], [place] [address] at 6:30 AM (This time is two hours before your procedure to ensure your preparation). Free valet parking service is available.   Special note: Every effort is made to have your procedure done on time. Please understand that emergencies sometimes delay scheduled procedures.  2. Diet: Do not eat solid foods after midnight.  The patient may have clear liquids until 5am upon the day of the procedure.  3.  Your physician recommends that you return for lab work one Friday, January 27.  You can come in on the day of your appointment anytime between 7:30-4:30.   4. Medication instructions in preparation for your procedure:   Contrast Allergy: No   On the morning of your procedure, take your Aspirin and any morning medicines NOT listed above.  You may use sips of water.  5. Plan for one night stay--bring personal belongings. 6. Bring a current list of your medications and current insurance cards. 7. You MUST have  a responsible person to drive you home. 8. Someone MUST be with you the first 24 hours after you arrive home or your discharge will be delayed. 9. Please wear clothes that are easy to get on and off and wear slip-on shoes.  Thank you for allowing [place] to care for you!   -- [place] [facility] Cardi[facility] services    Follow-Up: At [facility], you and your health needs are our priority.  As part of our continuing mission to provide you with exceptional heart care, we have created designated Provider Care Teams.  These Care Teams include your primary Cardiologist (physician) and Advanced Practice Providers (APPs -  Physician Assistants and Nurse Practitioners) who all work together to provide you with the care you need, when you need it.  We recommend signing up for the patient portal called "MyChart".  Sign up information is provided on this After Visit Summary.  MyChart is used to connect with patients for Virtual Visits (Telemedicine).  Patients are able to view lab/test results, encounter notes, upcoming appointments, etc.  Non-urgent messages can be sent to your provider as well.   To learn more about what you can do with MyChart, go to [email or web address].    Your next appointment:   2 month(s)  The format for your next appointment:   In Person  Provider:   Mertie Moores, MD     Other Instructions

## 2021-12-22 NOTE — Addendum Note (Signed)
Addended by: Thayer Headings on: 12/22/2021 05:55 PM   Modules accepted: Orders

## 2021-12-25 ENCOUNTER — Other Ambulatory Visit: Payer: Self-pay | Admitting: Internal Medicine

## 2022-01-02 ENCOUNTER — Other Ambulatory Visit: Payer: Self-pay

## 2022-01-02 ENCOUNTER — Other Ambulatory Visit: Payer: PPO | Admitting: *Deleted

## 2022-01-02 DIAGNOSIS — I200 Unstable angina: Secondary | ICD-10-CM | POA: Diagnosis not present

## 2022-01-03 LAB — CBC
Hematocrit: 40.8 % (ref 37.5–51.0)
Hemoglobin: 14.1 g/dL (ref 13.0–17.7)
MCH: 30.1 pg (ref 26.6–33.0)
MCHC: 34.6 g/dL (ref 31.5–35.7)
MCV: 87 fL (ref 79–97)
Platelets: 226 10*3/uL (ref 150–450)
RBC: 4.69 x10E6/uL (ref 4.14–5.80)
RDW: 12.7 % (ref 11.6–15.4)
WBC: 5 10*3/uL (ref 3.4–10.8)

## 2022-01-03 LAB — BASIC METABOLIC PANEL
BUN/Creatinine Ratio: 14 (ref 10–24)
BUN: 14 mg/dL (ref 8–27)
CO2: 25 mmol/L (ref 20–29)
Calcium: 8.6 mg/dL (ref 8.6–10.2)
Chloride: 101 mmol/L (ref 96–106)
Creatinine, Ser: 1.01 mg/dL (ref 0.76–1.27)
Glucose: 111 mg/dL — ABNORMAL HIGH (ref 70–99)
Potassium: 4.3 mmol/L (ref 3.5–5.2)
Sodium: 136 mmol/L (ref 134–144)
eGFR: 80 mL/min/{1.73_m2} (ref >59)

## 2022-01-06 ENCOUNTER — Telehealth: Payer: Self-pay | Admitting: *Deleted

## 2022-01-06 NOTE — Telephone Encounter (Signed)
Mailbox full, unable to leave a message. 

## 2022-01-06 NOTE — Telephone Encounter (Signed)
Cardiac catheterization scheduled at [facility] for: Wednesday January 07, 2022 10 AM (this is time change) [facility]) at: 8 AM   Diet-no solid food after midnight prior to cath, clear liquids until 5 AM day of procedure.   Medication instructions for procedure: -Usual morning medications can be taken pre-cath with sips of water including aspirin 81 mg.    Confirmed patient has responsible adult to drive home post procedure and be with patient first 24 hours after arriving home.  [facility] does allow one visitor to accompany you and wait in the hospital waiting room while you are there for your procedure. You and your visitor will be asked to wear a mask once you enter the hospital.   Patient reports does not currently have any new symptoms concerning for COVID-19 and no household members with COVID-19 like illness.    Call placed to patient to review procedure instructions, let him know about procedure time change to arrive 8 AM for 10 AM cath. Unable to reach patient/leave message, voicemail box full- I will sent MyChart message to patient with instructions and time change.

## 2022-01-06 NOTE — Telephone Encounter (Signed)
See MyChart message to patient 01/06/22-pt received time change information.

## 2022-01-07 ENCOUNTER — Other Ambulatory Visit (HOSPITAL_COMMUNITY): Payer: Self-pay

## 2022-01-07 ENCOUNTER — Ambulatory Visit (HOSPITAL_COMMUNITY)
Admission: RE | Admit: 2022-01-07 | Discharge: 2022-01-07 | Disposition: A | Discharge status: Home / Self Care | Payer: PPO | Attending: Cardiology | Admitting: Cardiology

## 2022-01-07 ENCOUNTER — Other Ambulatory Visit: Payer: Self-pay

## 2022-01-07 ENCOUNTER — Encounter (HOSPITAL_COMMUNITY)
Admission: RE | Disposition: A | Discharge status: Home / Self Care | Payer: Self-pay | Source: Home / Self Care | Attending: Cardiology

## 2022-01-07 DIAGNOSIS — G459 Transient cerebral ischemic attack, unspecified: Secondary | ICD-10-CM | POA: Diagnosis not present

## 2022-01-07 DIAGNOSIS — E785 Hyperlipidemia, unspecified: Secondary | ICD-10-CM | POA: Insufficient documentation

## 2022-01-07 DIAGNOSIS — I25110 Atherosclerotic heart disease of native coronary artery with unstable angina pectoris: Secondary | ICD-10-CM | POA: Diagnosis not present

## 2022-01-07 DIAGNOSIS — I200 Unstable angina: Secondary | ICD-10-CM

## 2022-01-07 DIAGNOSIS — I110 Hypertensive heart disease with heart failure: Secondary | ICD-10-CM | POA: Insufficient documentation

## 2022-01-07 DIAGNOSIS — I5032 Chronic diastolic (congestive) heart failure: Secondary | ICD-10-CM | POA: Diagnosis not present

## 2022-01-07 DIAGNOSIS — R079 Chest pain, unspecified: Secondary | ICD-10-CM | POA: Diagnosis present

## 2022-01-07 DIAGNOSIS — Z955 Presence of coronary angioplasty implant and graft: Secondary | ICD-10-CM

## 2022-01-07 DIAGNOSIS — I25119 Atherosclerotic heart disease of native coronary artery with unspecified angina pectoris: Secondary | ICD-10-CM | POA: Diagnosis present

## 2022-01-07 HISTORY — PX: LEFT HEART CATH AND CORONARY ANGIOGRAPHY: CATH118249

## 2022-01-07 LAB — POCT ACTIVATED CLOTTING TIME: Activated Clotting Time: 341 seconds

## 2022-01-07 SURGERY — LEFT HEART CATH AND CORONARY ANGIOGRAPHY
Anesthesia: LOCAL

## 2022-01-07 MED ORDER — HYDRALAZINE HCL 20 MG/ML IJ SOLN: 10.0000 mg | INTRAMUSCULAR | Status: DC | PRN

## 2022-01-07 MED ORDER — MIDAZOLAM HCL 2 MG/2ML IJ SOLN
INTRAMUSCULAR | Status: DC | PRN
  Administered 2022-01-07: 1 mg via INTRAVENOUS

## 2022-01-07 MED ORDER — SODIUM CHLORIDE 0.9% FLUSH: 3.0000 mL | INTRAVENOUS | Status: DC | PRN

## 2022-01-07 MED ORDER — ASPIRIN 81 MG PO CHEW: 81.0000 mg | CHEWABLE_TABLET | ORAL | Status: DC

## 2022-01-07 MED ORDER — LIDOCAINE HCL (PF) 1 % IJ SOLN
INTRAMUSCULAR | Status: AC
  Filled 2022-01-07: qty 30

## 2022-01-07 MED ORDER — HEPARIN (PORCINE) IN NACL 1000-0.9 UT/500ML-% IV SOLN
INTRAVENOUS | Status: AC
  Filled 2022-01-07: qty 1000

## 2022-01-07 MED ORDER — VERAPAMIL HCL 2.5 MG/ML IV SOLN
INTRAVENOUS | Status: AC
  Filled 2022-01-07: qty 2

## 2022-01-07 MED ORDER — ACETAMINOPHEN 325 MG PO TABS: 650.0000 mg | ORAL_TABLET | ORAL | Status: DC | PRN

## 2022-01-07 MED ORDER — HEPARIN (PORCINE) IN NACL 1000-0.9 UT/500ML-% IV SOLN
INTRAVENOUS | Status: DC | PRN
  Administered 2022-01-07 (×2): 500 mL

## 2022-01-07 MED ORDER — LABETALOL HCL 5 MG/ML IV SOLN: 10.0000 mg | INTRAVENOUS | Status: DC | PRN

## 2022-01-07 MED ORDER — MIDAZOLAM HCL 2 MG/2ML IJ SOLN
INTRAMUSCULAR | Status: AC
  Filled 2022-01-07: qty 2

## 2022-01-07 MED ORDER — HEPARIN SODIUM (PORCINE) 1000 UNIT/ML IJ SOLN
INTRAMUSCULAR | Status: DC | PRN
  Administered 2022-01-07 (×2): 4000 [IU] via INTRAVENOUS

## 2022-01-07 MED ORDER — TICAGRELOR 90 MG PO TABS: 90.0000 mg | ORAL_TABLET | Freq: Two times a day (BID) | ORAL | Status: DC

## 2022-01-07 MED ORDER — TICAGRELOR 90 MG PO TABS
ORAL_TABLET | ORAL | Status: DC | PRN
  Administered 2022-01-07: 180 mg via ORAL

## 2022-01-07 MED ORDER — FENTANYL CITRATE (PF) 100 MCG/2ML IJ SOLN
INTRAMUSCULAR | Status: DC | PRN
  Administered 2022-01-07: 25 ug via INTRAVENOUS

## 2022-01-07 MED ORDER — SODIUM CHLORIDE 0.9 % IV SOLN: INTRAVENOUS | Status: DC

## 2022-01-07 MED ORDER — SODIUM CHLORIDE 0.9% FLUSH: 3.0000 mL | Freq: Two times a day (BID) | INTRAVENOUS | Status: DC

## 2022-01-07 MED ORDER — LIDOCAINE HCL (PF) 1 % IJ SOLN
INTRAMUSCULAR | Status: DC | PRN
  Administered 2022-01-07: 2 mL

## 2022-01-07 MED ORDER — ONDANSETRON HCL 4 MG/2ML IJ SOLN: 4.0000 mg | Freq: Four times a day (QID) | INTRAMUSCULAR | Status: DC | PRN

## 2022-01-07 MED ORDER — TICAGRELOR 90 MG PO TABS
ORAL_TABLET | ORAL | Status: AC
  Filled 2022-01-07: qty 2

## 2022-01-07 MED ORDER — FENTANYL CITRATE (PF) 100 MCG/2ML IJ SOLN
INTRAMUSCULAR | Status: AC
  Filled 2022-01-07: qty 2

## 2022-01-07 MED ORDER — SODIUM CHLORIDE 0.9 % WEIGHT BASED INFUSION: 1.0000 mL/kg/h | INTRAVENOUS | Status: DC

## 2022-01-07 MED ORDER — SODIUM CHLORIDE 0.9 % IV SOLN: 250.0000 mL | INTRAVENOUS | Status: DC | PRN

## 2022-01-07 MED ORDER — VERAPAMIL HCL 2.5 MG/ML IV SOLN
INTRAVENOUS | Status: DC | PRN
  Administered 2022-01-07: 10 mL via INTRA_ARTERIAL

## 2022-01-07 MED ORDER — IOHEXOL 350 MG/ML SOLN
INTRAVENOUS | Status: DC | PRN
  Administered 2022-01-07: 140 mL

## 2022-01-07 MED ORDER — TICAGRELOR 90 MG PO TABS
90.0000 mg | ORAL_TABLET | Freq: Two times a day (BID) | ORAL | 5 refills | Status: DC
  Filled 2022-01-07: qty 60, 30d supply, fill #0
  Filled 2022-01-19: qty 60, 30d supply, fill #1
  Filled 2022-02-23: qty 60, 30d supply, fill #2
  Filled 2022-04-06: qty 60, 30d supply, fill #3
  Filled 2022-04-28: qty 60, 30d supply, fill #4
  Filled 2022-05-04: qty 60, 30d supply, fill #0
  Filled 2022-05-25: qty 60, 30d supply, fill #1

## 2022-01-07 MED ORDER — SODIUM CHLORIDE 0.9 % WEIGHT BASED INFUSION
3.0000 mL/kg/h | INTRAVENOUS | Status: AC
  Administered 2022-01-07: 3 mL/kg/h via INTRAVENOUS

## 2022-01-07 MED ORDER — NITROGLYCERIN 1 MG/10 ML FOR IR/CATH LAB
INTRA_ARTERIAL | Status: AC
  Filled 2022-01-07: qty 10

## 2022-01-07 MED ORDER — HEPARIN SODIUM (PORCINE) 1000 UNIT/ML IJ SOLN
INTRAMUSCULAR | Status: AC
  Filled 2022-01-07: qty 10

## 2022-01-07 MED ORDER — NITROGLYCERIN 1 MG/10 ML FOR IR/CATH LAB
INTRA_ARTERIAL | Status: DC | PRN
  Administered 2022-01-07: 200 ug via INTRACORONARY

## 2022-01-07 SURGICAL SUPPLY — 19 items
BALLN EMERGE MR 3.0X8 (BALLOONS) ×2
BALLN SAPPHIRE 2.0X12 (BALLOONS) ×2
BALLN SAPPHIRE ~~LOC~~ 3.0X8 (BALLOONS) ×1 IMPLANT
BALLOON EMERGE MR 3.0X8 (BALLOONS) IMPLANT
BALLOON SAPPHIRE 2.0X12 (BALLOONS) IMPLANT
CATH OPTITORQUE TIG 4.0 5F (CATHETERS) ×1 IMPLANT
CATH VISTA GUIDE 6FR XBLAD3.5 (CATHETERS) ×1 IMPLANT
DEVICE RAD COMP TR BAND LRG (VASCULAR PRODUCTS) ×1 IMPLANT
GLIDESHEATH SLEND SS 6F .021 (SHEATH) ×1 IMPLANT
GUIDEWIRE INQWIRE 1.5J.035X260 (WIRE) IMPLANT
INQWIRE 1.5J .035X260CM (WIRE) ×2
KIT ENCORE 26 ADVANTAGE (KITS) ×1 IMPLANT
KIT HEART LEFT (KITS) ×2 IMPLANT
PACK CARDIAC CATHETERIZATION (CUSTOM PROCEDURE TRAY) ×2 IMPLANT
STENT ONYX FRONTIER 2.75X15 (Permanent Stent) ×1 IMPLANT
TRANSDUCER W/STOPCOCK (MISCELLANEOUS) ×2 IMPLANT
TUBING CIL FLEX 10 FLL-RA (TUBING) ×2 IMPLANT
WIRE ASAHI PROWATER 180CM (WIRE) ×1 IMPLANT
WIRE HI TORQ BMW 190CM (WIRE) ×1 IMPLANT

## 2022-01-07 NOTE — Progress Notes (Signed)
Pt seen by cardiac rehab, pharmacy, and Vin (APP) prior to DC. Post cath EKG done. AVS reviewed with pt and sister. VSS at DC.

## 2022-01-07 NOTE — Discharge Summary (Signed)
Discharge Summary for Same Day PCI   Patient ID: Robert Mccall MRN: [id number]; DOB: September 17, 1951  Admit date: 01/07/2022 Discharge date: 01/07/2022  Primary Care Provider: [name], MD  Primary Cardiologist: Mertie Moores, MD  Primary Electrophysiologist:  None   Discharge Diagnoses    Active Problems:   Chest pain of uncertain etiology   Coronary artery disease involving native coronary artery of native heart with angina pectoris ([place])   Unstable angina (HCC)   HTN   HLD   Chronic diastolic CHF  Diagnostic Studies/Procedures    Cardiac Catheterization 01/07/2022:  LEFT HEART CATH AND CORONARY ANGIOGRAPHY   Conclusion      Mid LAD lesion is 90% stenosed with 90% stenosed side branch in 2nd Diag.   Balloon angioplasty was performed on the ostium of the diagonal branch, using a BALLN SAPPHIRE 2.0X12.   A drug-eluting stent was successfully placed covering the LAD lesion and crossing the diagonal sidebranch, using a STENT ONYX FRONTIER 2.75X15. ->  Deployed a 2.8 mm, with proximal optimization to 3.1 mm in the proximal half of the stent   Post intervention, there is a 0% residual stenosis in the LAD.   Post intervention, the side branch was reduced to 30% residual stenosis.  With mild ulceration noted but no dissection.   ----------------------------   Otherwise normal 1st Diag, remainder of LAD and 2nd Diag along with LCx and RCA.   The left ventricular systolic function is normal.  The left ventricular ejection fraction is 55-65% by visual estimate.   LV end diastolic pressure is normal.   There is no aortic valve stenosis.   SUMMARY Severe bifurcation LAD-D2 90% stenosis ([place] 1, 1, 1) as culprit lesion Successful PTCA of ostial D2 followed by DES PCI of LAD (ONYX FRONTIER 2.75 mm x 15 mm-deployed to 2.8 mm with proximal optimization to 3.1 mm) - Crossing the D2, and maintaining TIMI-3 flow reducing to 0% in the LAD and 30 % in the D2 ostium Otherwise  angiographically normal coronary arteries Relative normal EF with poor imaging. Normal LVEDP.   RECOMMENDATIONS Continue aggressive risk factor modification-consider increasing statin dose Continue beta-blocker and ARB-defer titration to primary cardiologist DAPT as noted  Diagnostic Dominance: Right Intervention     History of Present Illness     Robert Mccall is a 71 y.o. male with hx of CAD, chronic diastolic heart failure, hypertension and  hyperlipidemia presented for outpatient cath.   Evaluated by Dr. [name] 09/2019 for R breast lump and MGUS. No sign of gynecomastia. No evidence of malignancy by mammogram.    Coronary CT 04/2020 with coronary calcium score of 523. This was 76th percentile for age and sex matched control. There was moderate disease in the proximal/mid LAD. There was concern for possible obstruction just distal to the first diagonal at the level of the second diagonal.  There is no disease in the LAD distal to the second diagonal.  FFR did not show any significant obstructive disease. Managed medically.   Most recently seen by Dr. Acie Fredrickson December 22, 2021.  Few months history of chest discomfort radiating to his intrascapular region.  Did not get worse with exertion.  Cardiac catheterization arranged for further evaluation.  Hospital Course     Consultants: None   The patient underwent cardiac cath as noted above with severe bifurcation LAD-D2 90% stenosis ([place] 1, 1, 1) as culprit lesion Successful PTCA of ostial D2 followed by DES PCI of LAD (ONYX FRONTIER 2.75 mm x  15 mm-deployed to 2.8 mm with proximal optimization to 3.1 mm) - Crossing the D2, and maintaining TIMI-3 flow reducing to 0% in the LAD and 30 % in the D2 ostium.  Plan for DAPT with ASA and Brillinta.The patient was seen by cardiac rehab while in short stay. There were no observed complications post cath. Radial cath site was re-evaluated prior to discharge and found to be stable without  any complications. Instructions/precautions regarding cath site care were given prior to discharge.  02/25/2021: Cholesterol 145; HDL 44.00; LDL Cholesterol 64; Triglycerides 185.0; VLDL 37.0 Consider Lipid check and titration of Crestor as outpatient.   Robert Mccall was seen by Dr. Ellyn Hack and determined stable for discharge home. Follow up with our office has been arranged. Medications are listed below. Pertinent changes include Addition of Brilinta.  Cath/PCI Registry Performance & Quality Measures: Aspirin prescribed? - Yes ADP Receptor Inhibitor (Plavix/Clopidogrel, Brilinta/Ticagrelor or Effient/Prasugrel) prescribed (includes medically managed patients)? - Yes High Intensity Statin (Lipitor 40-80mg  or Crestor 20-40mg ) prescribed? - Yes For EF <40%, was ACEI/ARB prescribed? - Yes For EF <40%, Aldosterone Antagonist (Spironolactone or Eplerenone) prescribed? - Not Applicable (EF >/= 63%) Cardiac Rehab Phase II ordered (Included Medically managed Patients)? - Yes  Discharge Vitals Blood pressure 118/70, pulse 69, temperature 98.6 F (37 C), temperature source Oral, resp. rate 17, height 5\' 4"  (1.626 m), weight 74.8 kg, SpO2 96 %.  Filed Weights   01/07/22 0820  Weight: 74.8 kg    Last Labs & Radiologic Studies   _____________  CARDIAC CATHETERIZATION  Result Date: 01/07/2022   Mid LAD lesion is 90% stenosed with 90% stenosed side branch in 2nd Diag.   Balloon angioplasty was performed on the ostium of the diagonal branch, using a BALLN SAPPHIRE 2.0X12.   A drug-eluting stent was successfully placed covering the LAD lesion and crossing the diagonal sidebranch, using a STENT ONYX FRONTIER 2.75X15. ->  Deployed a 2.8 mm, with proximal optimization to 3.1 mm in the proximal half of the stent   Post intervention, there is a 0% residual stenosis in the LAD.   Post intervention, the side branch was reduced to 30% residual stenosis.  With mild ulceration noted but no dissection.    ----------------------------   Otherwise normal 1st Diag, remainder of LAD and 2nd Diag along with LCx and RCA.   The left ventricular systolic function is normal.  The left ventricular ejection fraction is 55-65% by visual estimate.   LV end diastolic pressure is normal.   There is no aortic valve stenosis. SUMMARY Severe bifurcation LAD-D2 90% stenosis ([place] 1, 1, 1) as culprit lesion Successful PTCA of ostial D2 followed by DES PCI of LAD (ONYX FRONTIER 2.75 mm x 15 mm-deployed to 2.8 mm with proximal optimization to 3.1 mm) - Crossing the D2, and maintaining TIMI-3 flow reducing to 0% in the LAD and 30 % in the D2 ostium Otherwise angiographically normal coronary arteries Relative normal EF with poor imaging. Normal LVEDP. RECOMMENDATIONS Continue aggressive risk factor modification-consider increasing statin dose Continue beta-blocker and ARB-defer titration to primary cardiologist DAPT as noted [name], MD   Disposition   Pt is being discharged home today in good condition.  Follow-up Plans & Appointments     Follow-up Information     Liliane Shi, PA-C Follow up on 01/27/2022.   Specialties: Cardiology, Physician Assistant Why: @2 :20pm for cath follow up Contact information: [address] [place] [address] [phone]  Discharge Medications   Allergies as of 01/07/2022   No Known Allergies      Medication List     TAKE these medications    alfuzosin 10 MG 24 hr tablet Commonly known as: UROXATRAL TAKE ONE TABLET BY MOUTH ONE TIME DAILY WITH BREAKFAST   aspirin EC 81 MG tablet Take 1 tablet (81 mg total) by mouth daily. Swallow whole.   Brilinta 90 MG Tabs tablet Generic drug: ticagrelor Take 1 tablet (90 mg total) by mouth 2 (two) times daily.   DULoxetine 30 MG capsule Commonly known as: CYMBALTA TAKE ONE CAPSULE BY MOUTH ONE TIME DAILY   esomeprazole 20 MG capsule Commonly known as: NEXIUM Take 20 mg by  mouth daily at 12 noon.   finasteride 5 MG tablet Commonly known as: PROSCAR TAKE 1 TABLET(5 MG) BY MOUTH DAILY   Krill Oil 500 MG Caps Take 1 capsule by mouth daily.   lamoTRIgine 25 MG tablet Commonly known as: LAMICTAL TAKE ONE TABLET BY MOUTH TWICE DAILY   latanoprost 0.005 % ophthalmic solution Commonly known as: XALATAN Place 1 drop into both eyes at bedtime.   methylphenidate 20 MG ER tablet Commonly known as: METADATE ER Take 1 tablet (20 mg total) by mouth daily.   metoprolol tartrate 25 MG tablet Commonly known as: LOPRESSOR Take 1.5 tablets (37.5 mg total) by mouth 2 (two) times daily.   nitroGLYCERIN 0.4 MG SL tablet Commonly known as: NITROSTAT Place 1 tablet (0.4 mg total) under the tongue every 5 (five) minutes as needed for chest pain (Call 911 if 3 doses does not relieve pain.).   olmesartan 40 MG tablet Commonly known as: BENICAR TAKE 1 TABLET(40 MG) BY MOUTH DAILY   PROBIOTIC-10 PO Take 1 capsule by mouth daily.   rosuvastatin 20 MG tablet Commonly known as: CRESTOR TAKE 1 TABLET(20 MG) BY MOUTH DAILY   Testosterone 20.25 MG/ACT (1.62%) Gel APPLY 2 ACTUATIONS ONTO THE SKIN DAILY   timolol 0.5 % ophthalmic solution Commonly known as: TIMOPTIC INT 1 GTT IN EACH EYE QAM   vitamin B-12 1000 MCG tablet Commonly known as: CYANOCOBALAMIN Take 1,000 mcg by mouth daily.   Vitamin D 50 MCG (2000 UT) Caps Take 1 capsule (2,000 Units total) by mouth daily.           Allergies No Known Allergies  Outstanding Labs/Studies   None  Duration of Discharge Encounter   Greater than 30 minutes including physician time.  Jarrett Soho, PA 01/07/2022, 1:39 PM

## 2022-01-07 NOTE — Interval H&P Note (Signed)
History and Physical Interval Note:  01/07/2022 10:14 AM  Robert Mccall  has presented today for surgery, with the diagnosis of unstable angina.  The various methods of treatment have been discussed with the patient and family. After consideration of risks, benefits and other options for treatment, the patient has consented to  Procedure(s): LEFT HEART CATH AND CORONARY ANGIOGRAPHY (N/A)  PERCUTANEOUS CORONARY INTERVENTION  as a surgical intervention.  The patient's history has been reviewed, patient examined, no change in status, stable for surgery.  I have reviewed the patient's chart and labs.  Questions were answered to the patient's satisfaction.    Cath Lab Visit (complete for each Cath Lab visit)  Clinical Evaluation Leading to the Procedure:   ACS: Yes.    Non-ACS:    Anginal Classification: CCS III  Anti-ischemic medical therapy: No Therapy  Non-Invasive Test Results: Low-risk stress test findings: cardiac mortality <1%/year -negative Coronary CTA with CT FFR 1 year ago; however patient is having ongoing symptoms  Prior CABG: No previous CABG   [name]

## 2022-01-07 NOTE — Progress Notes (Signed)
Discussed with pt stent, restrictions, Brilinta importance, diet, exercise, NTG and CRPII. Pt voiced understanding and requests referral be sent to [facility].  Swartz CES, ACSM 2:12 PM 01/07/2022

## 2022-01-08 ENCOUNTER — Encounter (HOSPITAL_COMMUNITY): Payer: Self-pay | Admitting: Cardiology

## 2022-01-13 ENCOUNTER — Telehealth (HOSPITAL_COMMUNITY): Payer: Self-pay

## 2022-01-13 NOTE — Telephone Encounter (Signed)
Attempted to call patient in regards to Cardiac Rehab - LM on VM 

## 2022-01-13 NOTE — Telephone Encounter (Signed)
Pt insurance is active and benefits verified through [employer]. Co-pay $15.00, DED $0.00/$0.00 met, out of pocket $5,100.00/$25.00 met, co-insurance 0%. No pre-authorization required. Meet/HTA, 01/13/22 @ 2:57PM, XNA#[id number]   Will contact patient to see if he is interested in the Cardiac Rehab Program. If interested, patient will need to complete follow up appt. Once completed, patient will be contacted for scheduling upon review by the RN Navigator.

## 2022-01-19 ENCOUNTER — Other Ambulatory Visit (HOSPITAL_BASED_OUTPATIENT_CLINIC_OR_DEPARTMENT_OTHER): Payer: Self-pay

## 2022-01-20 ENCOUNTER — Other Ambulatory Visit (HOSPITAL_BASED_OUTPATIENT_CLINIC_OR_DEPARTMENT_OTHER): Payer: Self-pay

## 2022-01-20 ENCOUNTER — Other Ambulatory Visit (HOSPITAL_COMMUNITY): Payer: Self-pay

## 2022-01-20 ENCOUNTER — Encounter: Payer: Self-pay | Admitting: Internal Medicine

## 2022-01-21 ENCOUNTER — Other Ambulatory Visit (HOSPITAL_COMMUNITY): Payer: Self-pay

## 2022-01-21 ENCOUNTER — Telehealth (HOSPITAL_COMMUNITY): Payer: Self-pay

## 2022-01-21 NOTE — Telephone Encounter (Signed)
Transitions of Care Pharmacy  ° °Call attempted for a pharmacy transitions of care follow-up. HIPAA appropriate voicemail was left with call back information provided.  ° °Call attempt #1. Will follow-up in 2-3 days.  °  °

## 2022-01-22 ENCOUNTER — Telehealth (HOSPITAL_COMMUNITY): Payer: Self-pay | Admitting: Pharmacist

## 2022-01-22 ENCOUNTER — Other Ambulatory Visit (HOSPITAL_COMMUNITY): Payer: Self-pay

## 2022-01-22 NOTE — Telephone Encounter (Signed)
Pharmacy Transitions of Care Follow-up Telephone Call  Date of discharge: 2/1  Discharge Diagnosis: CAD, angina, DES  How have you been since you were released from the hospital? No complaints, doing well   Medication changes made at discharge:  - START: Brilinta  - STOPPED: n/a  - CHANGED: n/a  Medication changes verified by the patient? Yes    Medication Accessibility:  Home Pharmacy: [facility] and [facility]   Was the patient provided with refills on discharged medications? yes   Have all prescriptions been transferred from [facility] to home pharmacy? Yes, to [facility] rx   Is the patient able to afford medications? Yes, has ins Notable copays: Brilinta($25) Eligible patient assistance: n/a    Medication Review:  TICAGRELOR (BRILINTA) Ticagrelor 90 mg BID initiated on 01/07/22.  - Educated patient on expected duration of therapy of aspirin with ticagrelor.  - Discussed importance of taking medication around the same time every day, - Reviewed potential DDIs with patient - Advised patient of medications to avoid (NSAIDs, aspirin maintenance doses>100 mg daily) - Educated that Tylenol (acetaminophen) will be the preferred analgesic to prevent risk of bleeding  - Emphasized importance of monitoring for signs and symptoms of bleeding (abnormal bruising, prolonged bleeding, nose bleeds, bleeding from gums, discolored urine, black tarry stools)  - Educated patient to notify doctor if shortness of breath or abnormal heartbeat occur - Advised patient to alert all providers of antiplatelet therapy prior to starting a new medication or having a procedure    Follow-up Appointments:  PCP Hospital f/u appt confirmed?  Scheduled to see Dr. Cathlean Cower on 01/23/22.   [facility] f/u appt confirmed?  Scheduled to see cardiology, Richardson Dopp on 01/27/22.   If their condition worsens, is the pt aware to call PCP or go to the Emergency Dept.? yes  Final Patient Assessment: Pt returned my call  stating he was doing well and reports compliance with Brilinta.  Denies any adverse events.  Last filled Brilinta at [facility].  Offered that we can also transfer other rxs to [facility] if he prefers.

## 2022-01-22 NOTE — Telephone Encounter (Signed)
Transitions of Care Pharmacy   Call attempted for a pharmacy transitions of care follow-up. HIPAA appropriate voicemail was left with call back information provided.   Call attempt #2. Will follow-up in 2-3 days.    

## 2022-01-23 ENCOUNTER — Encounter: Payer: Self-pay | Admitting: Internal Medicine

## 2022-01-23 ENCOUNTER — Other Ambulatory Visit: Payer: Self-pay

## 2022-01-23 ENCOUNTER — Ambulatory Visit (INDEPENDENT_AMBULATORY_CARE_PROVIDER_SITE_OTHER): Payer: PPO | Admitting: Internal Medicine

## 2022-01-23 VITALS — BP 116/68 | HR 82 | Temp 98.0°F | Ht 64.0 in | Wt 166.1 lb

## 2022-01-23 DIAGNOSIS — I10 Essential (primary) hypertension: Secondary | ICD-10-CM

## 2022-01-23 DIAGNOSIS — E118 Type 2 diabetes mellitus with unspecified complications: Secondary | ICD-10-CM

## 2022-01-23 DIAGNOSIS — R059 Cough, unspecified: Secondary | ICD-10-CM | POA: Insufficient documentation

## 2022-01-23 DIAGNOSIS — R051 Acute cough: Secondary | ICD-10-CM | POA: Diagnosis not present

## 2022-01-23 MED ORDER — HYDROCODONE BIT-HOMATROP MBR 5-1.5 MG/5ML PO SOLN: 5.0000 mL | Freq: Four times a day (QID) | ORAL | 0 refills | Status: AC | PRN

## 2022-01-23 MED ORDER — AZITHROMYCIN 250 MG PO TABS: ORAL_TABLET | ORAL | 1 refills | Status: AC

## 2022-01-23 NOTE — Progress Notes (Signed)
Patient ID: Robert Mccall, male   DOB: 1951-03-04, 71 y.o.   MRN: [id number]        Chief Complaint: follow up cough       HPI:  Robert Mccall is a 71 y.o. male Here with acute onset mild to mod 2-3 days ST, HA, general weakness and malaise, with prod cough greenish sputum, but Pt denies chest pain, increased sob or doe, wheezing, orthopnea, PND, increased LE swelling, palpitations, dizziness or syncope.   Pt denies polydipsia, polyuria, or low sugar symptoms and no new focal neuro s/s.         Wt Readings from Last 3 Encounters:  01/23/22 166 lb 2 oz (75.4 kg)  01/07/22 165 lb (74.8 kg)  12/22/21 170 lb 12.8 oz (77.5 kg)   BP Readings from Last 3 Encounters:  01/23/22 116/68  01/07/22 133/79  12/22/21 138/72         Past Medical History:  Diagnosis Date   ADHD (attention deficit hyperactivity disorder)    Anxiety    Benign prostatic hypertrophy    Chronic lower back pain    surgery schedule for back 10/21/16   Cyst of right kidney    incidental [place] 1.3cm on R (MRI abd 09/2014), follows with uro   Depression    GERD (gastroesophageal reflux disease)    Glaucoma    Hard of hearing    History of migraine headaches    no meds   Hyperlipidemia    Hypertension    Menetrier's disease    Past Surgical History:  Procedure Laterality Date   COLONOSCOPY  09/2007   polyps/ [facility]   HYDROCELE EXCISION Right 1990   LEFT HEART CATH AND CORONARY ANGIOGRAPHY N/A 01/07/2022   Procedure: LEFT HEART CATH AND CORONARY ANGIOGRAPHY;  Surgeon: Leonie Man, MD;  Location: [facility];  Service: Cardiovascular;  Laterality: N/A;   LUMBAR DISC SURGERY  10/21/2016   ORCHIECTOMY Left 1970   WISDOM TOOTH EXTRACTION      reports that he has never smoked. He has never used smokeless tobacco. He reports current alcohol use. He reports that he does not use drugs. family history includes Breast cancer in his maternal grandmother; Breast cancer (age of onset: 30) in his mother;  COPD in his mother; Cancer in his father; Depression in his mother; Diabetes in his father; Heart disease in his mother; Macular degeneration in his father; Osteoarthritis in his mother. No Known Allergies Current Outpatient Medications on File Prior to Visit  Medication Sig Dispense Refill   alfuzosin (UROXATRAL) 10 MG 24 hr tablet TAKE ONE TABLET BY MOUTH ONE TIME DAILY WITH BREAKFAST 90 tablet 0   aspirin EC 81 MG tablet Take 1 tablet (81 mg total) by mouth daily. Swallow whole. 90 tablet 3   Cholecalciferol (VITAMIN D) 50 MCG (2000 UT) CAPS Take 1 capsule (2,000 Units total) by mouth daily. 30 capsule 11   DULoxetine (CYMBALTA) 30 MG capsule TAKE ONE CAPSULE BY MOUTH ONE TIME DAILY 90 capsule 0   esomeprazole (NEXIUM) 20 MG capsule Take 20 mg by mouth daily at 12 noon.     finasteride (PROSCAR) 5 MG tablet TAKE 1 TABLET(5 MG) BY MOUTH DAILY 90 tablet 1   Krill Oil 500 MG CAPS Take 1 capsule by mouth daily.     lamoTRIgine (LAMICTAL) 25 MG tablet TAKE ONE TABLET BY MOUTH TWICE DAILY 180 tablet 0   latanoprost (XALATAN) 0.005 % ophthalmic solution Place 1 drop into both eyes  at bedtime.     methylphenidate (METADATE ER) 20 MG ER tablet Take 1 tablet (20 mg total) by mouth daily. 90 tablet 0   metoprolol tartrate (LOPRESSOR) 25 MG tablet Take 1.5 tablets (37.5 mg total) by mouth 2 (two) times daily. 270 tablet 1   nitroGLYCERIN (NITROSTAT) 0.4 MG SL tablet Place 1 tablet (0.4 mg total) under the tongue every 5 (five) minutes as needed for chest pain (Call 911 if 3 doses does not relieve pain.). 25 tablet 3   olmesartan (BENICAR) 40 MG tablet TAKE 1 TABLET(40 MG) BY MOUTH DAILY 90 tablet 1   Probiotic Product (PROBIOTIC-10 PO) Take 1 capsule by mouth daily.     rosuvastatin (CRESTOR) 20 MG tablet TAKE 1 TABLET(20 MG) BY MOUTH DAILY 90 tablet 2   Testosterone 20.25 MG/ACT (1.62%) GEL APPLY 2 ACTUATIONS ONTO THE SKIN DAILY 75 g 1   ticagrelor (BRILINTA) 90 MG TABS tablet Take 1 tablet (90 mg total)  by mouth 2 (two) times daily. 60 tablet 5   timolol (TIMOPTIC) 0.5 % ophthalmic solution INT 1 GTT IN EACH EYE QAM  0   vitamin B-12 (CYANOCOBALAMIN) 1000 MCG tablet Take 1,000 mcg by mouth daily.     No current facility-administered medications on file prior to visit.        ROS:  All others reviewed and negative.  Objective        PE:  BP 116/68    Pulse 82    Temp 98 F (36.7 C) (Oral)    Ht 5\' 4"  (1.626 m)    Wt 166 lb 2 oz (75.4 kg)    SpO2 95%    BMI 28.52 kg/m                 Constitutional: Pt appears in NAD, mild ill               HENT: Head: NCAT.                Right Ear: External ear normal.                 Left Ear: External ear normal. Bilat tm's with mild erythema.  Max sinus areas mild tender.  Pharynx with mild erythema, no exudate               Eyes: . Pupils are equal, round, and reactive to light. Conjunctivae and EOM are normal               Nose: without d/c or deformity               Neck: Neck supple. Gross normal ROM               Cardiovascular: Normal rate and regular rhythm.                 Pulmonary/Chest: Effort normal and breath sounds without rales or wheezing.                Abd:  Soft, NT, ND, + BS, no organomegaly               Neurological: Pt is alert. At baseline orientation, motor grossly intact               Skin: Skin is warm. No rashes, no other new lesions, LE edema - none               Psychiatric: Pt behavior is normal without agitation  Micro: none  Cardiac tracings I have personally interpreted today:  none  Pertinent Radiological findings (summarize): none   Lab Results  Component Value Date   WBC 5.0 01/02/2022   HGB 14.1 01/02/2022   HCT 40.8 01/02/2022   PLT 226 01/02/2022   GLUCOSE 111 (H) 01/02/2022   CHOL 145 02/25/2021   TRIG 185.0 (H) 02/25/2021   HDL 44.00 02/25/2021   LDLDIRECT 76.0 11/29/2019   LDLCALC 64 02/25/2021   ALT 41 09/05/2021   AST 31 09/05/2021   NA 136 01/02/2022   K 4.3 01/02/2022   CL 101  01/02/2022   CREATININE 1.01 01/02/2022   BUN 14 01/02/2022   CO2 25 01/02/2022   TSH 1.90 02/25/2021   PSA 0.91 02/25/2021   INR 1.04 01/23/2017   HGBA1C 6.1 08/28/2021   MICROALBUR 5.3 (H) 02/25/2021   Assessment/Plan:  Robert Mccall is a 71 y.o. White or Caucasian [1] male with  has a past medical history of ADHD (attention deficit hyperactivity disorder), Anxiety, Benign prostatic hypertrophy, Chronic lower back pain, Cyst of right kidney, Depression, GERD (gastroesophageal reflux disease), Glaucoma, Hard of hearing, History of migraine headaches, Hyperlipidemia, Hypertension, and Menetrier's disease.  Cough Mild to mod, c/w bronhitis vs pna, declines cxr, for antibx course, cough med prn, to f/u any worsening symptoms or concerns  Essential hypertension BP Readings from Last 3 Encounters:  01/23/22 116/68  01/07/22 133/79  12/22/21 138/72   Stable, pt to continue medical treatment toprol, benicar   Type II diabetes mellitus with manifestations ([place]) Lab Results  Component Value Date   HGBA1C 6.1 08/28/2021   Stable, pt to continue current medical treatment  - diet  Followup: Return if symptoms worsen or fail to improve.  Cathlean Cower, MD 01/23/2022 7:38 PM [facility]

## 2022-01-23 NOTE — Patient Instructions (Signed)
Please take all new medication as prescribed - the antibiotic and cough medicine as needed  Please continue all other medications as before, and refills have been done if requested.  Please have the pharmacy call with any other refills you may need.  Please keep your appointments with your specialists as you may have planned    

## 2022-01-23 NOTE — Assessment & Plan Note (Signed)
Mild to mod, c/w bronhitis vs pna, declines cxr, for antibx course, cough med prn, to f/u any worsening symptoms or concerns

## 2022-01-23 NOTE — Assessment & Plan Note (Signed)
Lab Results  Component Value Date   HGBA1C 6.1 08/28/2021   Stable, pt to continue current medical treatment  - diet

## 2022-01-23 NOTE — Assessment & Plan Note (Signed)
BP Readings from Last 3 Encounters:  01/23/22 116/68  01/07/22 133/79  12/22/21 138/72   Stable, pt to continue medical treatment toprol, benicar

## 2022-01-26 NOTE — Progress Notes (Signed)
Cardiology Office Note:    Date:  01/27/2022   ID:  Robert Mccall, DOB 11-Feb-1951, MRN [id number]  PCP:  [name], MD   [facility] Providers Cardiologist:  Mertie Moores, MD     Referring MD: [name], MD   Post PCI follow-up   History of Present Illness:    Robert Mccall is a 71 y.o. male with a hx of HLD, HTN, diastolic heart failure, TIA, CAD.  Lexiscan Myoview performed 8/18 with findings of nonischemic cardiomyopathy.  Echo 8/18 with EF 50% - 55%, mild left ventricular hypertrophy no RWMA, grade 1 diastolic dysfunction with mild aortic regurg mild dilated right atrium.  He had complaints of chest pain and was seen by Dr. Acie Fredrickson who ordered coronary CT 5/21 with calcium score 523 and moderate disease in proximal mid LAD FFR did not show significant obstruction he was medically managed. Cardiac cath in 2/23 after complaint of unstable angina revealed mid LAD 90% treated with DES to mLAD and angioplasty to 2nd diagonal side branch. LVEF was 55-65% via LV gram.   Mr.Robert Mccall presents today for 53-month follow-up following PCI.  He is doing well since his PCI and reports no chest pain or shortness of breath.  Compliant with his aspirin and Brilinta and denies any melena, hematuria, hemoptysis.  He denies shortness of breath related to Brilinta but was recently treated for bronchitis.  He is following heart healthy diet and exercising on his own.  Due to his work hours he is not able to attend cardiac rehab.Post radial cath site pulse palpable with no swelling or hematoma present.  No thrill or bruit present.  He denies lower extremity edema, fatigue, palpitations,diaphoresis, weakness, presyncope, syncope, orthopnea, and PND.    Past Medical History:  Diagnosis Date   ADHD (attention deficit hyperactivity disorder)    Anxiety    Benign prostatic hypertrophy    Chronic lower back pain    surgery schedule for back 10/21/16   Cyst of right kidney    incidental  [place] 1.3cm on R (MRI abd 09/2014), follows with uro   Depression    GERD (gastroesophageal reflux disease)    Glaucoma    Hard of hearing    History of migraine headaches    no meds   Hyperlipidemia    Hypertension    Menetrier's disease     Past Surgical History:  Procedure Laterality Date   COLONOSCOPY  09/2007   polyps/ [place]   HYDROCELE EXCISION Right 1990   LEFT HEART CATH AND CORONARY ANGIOGRAPHY N/A 01/07/2022   Procedure: LEFT HEART CATH AND CORONARY ANGIOGRAPHY;  Surgeon: Leonie Man, MD;  Location: [place] [facility];  Service: Cardiovascular;  Laterality: N/A;   LUMBAR DISC SURGERY  10/21/2016   ORCHIECTOMY Left 1970   WISDOM TOOTH EXTRACTION      Current Medications: Current Meds  Medication Sig   alfuzosin (UROXATRAL) 10 MG 24 hr tablet TAKE ONE TABLET BY MOUTH DAILY IN THE MORNING WITH BREAKFAST   aspirin EC 81 MG tablet Take 1 tablet (81 mg total) by mouth daily. Swallow whole.   Cholecalciferol (VITAMIN D) 50 MCG (2000 UT) CAPS Take 1 capsule (2,000 Units total) by mouth daily.   DULoxetine (CYMBALTA) 30 MG capsule TAKE ONE CAPSULE BY MOUTH ONE TIME DAILY   esomeprazole (NEXIUM) 20 MG capsule Take 20 mg by mouth daily at 12 noon.   finasteride (PROSCAR) 5 MG tablet TAKE 1 TABLET(5 MG) BY MOUTH DAILY  HYDROcodone bit-homatropine (HYCODAN) 5-1.5 MG/5ML syrup Take 5 mLs by mouth every 6 (six) hours as needed for up to 10 days.   Krill Oil 500 MG CAPS Take 1 capsule by mouth daily.   lamoTRIgine (LAMICTAL) 25 MG tablet TAKE ONE TABLET BY MOUTH TWICE DAILY   latanoprost (XALATAN) 0.005 % ophthalmic solution Place 1 drop into both eyes at bedtime.   methylphenidate (METADATE ER) 20 MG ER tablet Take 1 tablet (20 mg total) by mouth daily.   metoprolol tartrate (LOPRESSOR) 25 MG tablet Take 1.5 tablets (37.5 mg total) by mouth 2 (two) times daily.   nitroGLYCERIN (NITROSTAT) 0.4 MG SL tablet Place 1 tablet (0.4 mg total) under the tongue every 5 (five) minutes as  needed for chest pain (Call 911 if 3 doses does not relieve pain.).   olmesartan (BENICAR) 40 MG tablet TAKE 1 TABLET(40 MG) BY MOUTH DAILY   Probiotic Product (PROBIOTIC-10 PO) Take 1 capsule by mouth daily.   rosuvastatin (CRESTOR) 20 MG tablet TAKE 1 TABLET(20 MG) BY MOUTH DAILY   Testosterone 20.25 MG/ACT (1.62%) GEL APPLY 2 ACTUATIONS ONTO THE SKIN DAILY   ticagrelor (BRILINTA) 90 MG TABS tablet Take 1 tablet (90 mg total) by mouth 2 (two) times daily.   timolol (TIMOPTIC) 0.5 % ophthalmic solution INT 1 GTT IN EACH EYE QAM   vitamin B-12 (CYANOCOBALAMIN) 1000 MCG tablet Take 1,000 mcg by mouth daily.     Allergies:   Patient has no known allergies.   Social History   Socioeconomic History   Marital status: Single    Spouse name: Not on file   Number of children: Not on file   Years of education: Not on file   Highest education level: Not on file  Occupational History   Occupation: Freight forwarder    Comment: [employer]  Tobacco Use   Smoking status: Never   Smokeless tobacco: Never  Vaping Use   Vaping Use: Never used  Substance and Sexual Activity   Alcohol use: Yes    Comment: occ   Drug use: No   Sexual activity: Yes    Comment: working with replacement  Other Topics Concern   Not on file  Social History Narrative   Not on file   Social Determinants of Health   Financial Resource Strain: Not on file  Food Insecurity: Not on file  Transportation Needs: Not on file  Physical Activity: Not on file  Stress: Not on file  Social Connections: Not on file     Family History: The patient's family history includes Breast cancer in his maternal grandmother; Breast cancer (age of onset: 60) in his mother; COPD in his mother; Cancer in his father; Depression in his mother; Diabetes in his father; Heart disease in his mother; Macular degeneration in his father; Osteoarthritis in his mother. There is no history of Colon cancer, Esophageal cancer, Rectal cancer,  or Stomach cancer.  ROS:   Please see the history of present illness.    Review of Systems  Respiratory:  Negative for cough and hemoptysis.   Cardiovascular:  Negative for chest pain, palpitations and leg swelling.  Genitourinary:  Negative for hematuria.  Neurological:  Negative for dizziness and headaches.   All other systems reviewed and are negative.  EKGs/Labs/Other Studies Reviewed:    The following studies were reviewed today:   EKG:  EKG  ordered today.  The ekg ordered today demonstrates sinus rhythm rate of 75  Recent Labs: 02/25/2021: TSH 1.90 08/28/2021: Pro B  Natriuretic peptide (BNP) 24.0 09/05/2021: ALT 41 01/02/2022: BUN 14; Creatinine, Ser 1.01; Hemoglobin 14.1; Platelets 226; Potassium 4.3; Sodium 136  Recent Lipid Panel    Component Value Date/Time   CHOL 145 02/25/2021 1435   CHOL 107 05/27/2020 1239   TRIG 185.0 (H) 02/25/2021 1435   HDL 44.00 02/25/2021 1435   HDL 31 (L) 05/27/2020 1239   CHOLHDL 3 02/25/2021 1435   VLDL 37.0 02/25/2021 1435   LDLCALC 64 02/25/2021 1435   LDLCALC 52 05/27/2020 1239   LDLDIRECT 76.0 11/29/2019 0901     Risk Assessment/Calculations:           Physical Exam:    VS:  BP 108/78    Pulse (!) 113    Resp 16    Ht 5\' 4"  (1.626 m)    Wt 166 lb 9.6 oz (75.6 kg)    SpO2 99%    BMI 28.60 kg/m     Wt Readings from Last 3 Encounters:  01/27/22 166 lb 9.6 oz (75.6 kg)  01/23/22 166 lb 2 oz (75.4 kg)  01/07/22 165 lb (74.8 kg)     GEN:  Well nourished, well developed in no acute distress HEENT: Normal NECK: No JVD; No carotid bruits LYMPHATICS: No lymphadenopathy CARDIAC: RRR, no murmurs, rubs, gallops, S1 and S2 present RESPIRATORY:  Clear to auscultation without rales, wheezing or rhonchi  ABDOMEN: Soft, non-tender, non-distended MUSCULOSKELETAL:  No edema; No deformity  SKIN: Warm and dry NEUROLOGIC:  Alert and oriented x 3 PSYCHIATRIC:  Normal affect   ASSESSMENT:    1. Coronary artery disease involving  native coronary artery of native heart without angina pectoris   2. Chronic diastolic CHF (congestive heart failure) ([place])   3. Essential hypertension   4. Hyperlipidemia with target LDL less than 130    PLAN:    In order of problems listed above:  1.Coronary artery disease: s/p cardiac cath on 2/1 with DES to 90% mid LAD lesion.  He is compliant with aspirin and Brilinta and denies chest pain and shortness of breath. -Continue 12 month DAPT with ASA, Brilinta.   -Continue Crestor 20 mg  2.  Chronic diastolic heart failure: Last echo 8/18 with EF of 50-55%.  No chest pain or dyspnea noted.  He appears euvolemic on exam. -Continue metoprolol 25 mg  -Cr:1.01 BUN: 14  3.  Hypertension BP today: 108/78 well-controlled -Continue olmesartan 40 mg Cr:1.01 and BUN:14 1/23 -Continue metoprolol 25 mg -Heart healthy diet -Exercise 150 minutes/week  4.  Hyperlipidemia: Last LDL: 64, LFT WNL -Continue Crestor 20 mg -Heart healthy diet -Exercise 150 minutes/week  5. Disposition: -Follow-up with Dr. Acie Fredrickson or APP in 3 months     Cardiac Rehabilitation Eligibility Assessment  The patient has declined or is not appropriate for cardiac rehabilitation. (Patient stated he works fulltime and is unable to attend.)          Medication Adjustments/Labs and Tests Ordered: Current medicines are reviewed at length with the patient today.  Concerns regarding medicines are outlined above.  No orders of the defined types were placed in this encounter.  No orders of the defined types were placed in this encounter.   Patient Instructions  Medication Instructions:   Your physician recommends that you continue on your current medications as directed. Please refer to the Current Medication list given to you today.   *If you need a refill on your cardiac medications before your next appointment, please call your pharmacy*  Follow-Up: At [facility], you  and your health needs are our priority.   As part of our continuing mission to provide you with exceptional heart care, we have created designated Provider Care Teams.  These Care Teams include your primary Cardiologist (physician) and Advanced Practice Providers (APPs -  Physician Assistants and Nurse Practitioners) who all work together to provide you with the care you need, when you need it.  We recommend signing up for the patient portal called "MyChart".  Sign up information is provided on this After Visit Summary.  MyChart is used to connect with patients for Virtual Visits (Telemedicine).  Patients are able to view lab/test results, encounter notes, upcoming appointments, etc.  Non-urgent messages can be sent to your provider as well.   To learn more about what you can do with MyChart, go to [email or web address].    Your next appointment:   3 month(s)  The format for your next appointment:   In Person  Provider:   Mertie Moores, MD       Signed, Mable Fill, Marissa Nestle, NP  01/27/2022 3:34 PM    [place]

## 2022-01-27 ENCOUNTER — Ambulatory Visit: Payer: PPO | Admitting: Nurse Practitioner

## 2022-01-27 ENCOUNTER — Other Ambulatory Visit: Payer: Self-pay | Admitting: Internal Medicine

## 2022-01-27 ENCOUNTER — Encounter: Payer: Self-pay | Admitting: Nurse Practitioner

## 2022-01-27 ENCOUNTER — Other Ambulatory Visit: Payer: Self-pay

## 2022-01-27 VITALS — BP 108/78 | HR 113 | Resp 16 | Ht 64.0 in | Wt 166.6 lb

## 2022-01-27 DIAGNOSIS — R351 Nocturia: Secondary | ICD-10-CM

## 2022-01-27 DIAGNOSIS — I5032 Chronic diastolic (congestive) heart failure: Secondary | ICD-10-CM | POA: Diagnosis not present

## 2022-01-27 DIAGNOSIS — I2510 Atherosclerotic heart disease of native coronary artery without angina pectoris: Secondary | ICD-10-CM | POA: Diagnosis not present

## 2022-01-27 DIAGNOSIS — E785 Hyperlipidemia, unspecified: Secondary | ICD-10-CM

## 2022-01-27 DIAGNOSIS — I10 Essential (primary) hypertension: Secondary | ICD-10-CM | POA: Diagnosis not present

## 2022-01-27 DIAGNOSIS — N401 Enlarged prostate with lower urinary tract symptoms: Secondary | ICD-10-CM

## 2022-01-27 NOTE — Patient Instructions (Signed)
Medication Instructions:   Your physician recommends that you continue on your current medications as directed. Please refer to the Current Medication list given to you today.   *If you need a refill on your cardiac medications before your next appointment, please call your pharmacy*  Follow-Up: At [facility], you and your health needs are our priority.  As part of our continuing mission to provide you with exceptional heart care, we have created designated Provider Care Teams.  These Care Teams include your primary Cardiologist (physician) and Advanced Practice Providers (APPs -  Physician Assistants and Nurse Practitioners) who all work together to provide you with the care you need, when you need it.  We recommend signing up for the patient portal called "MyChart".  Sign up information is provided on this After Visit Summary.  MyChart is used to connect with patients for Virtual Visits (Telemedicine).  Patients are able to view lab/test results, encounter notes, upcoming appointments, etc.  Non-urgent messages can be sent to your provider as well.   To learn more about what you can do with MyChart, go to [email or web address].    Your next appointment:   3 month(s)  The format for your next appointment:   In Person  Provider:   Mertie Moores, MD

## 2022-01-28 NOTE — Addendum Note (Signed)
Addended by: Gaetano Net on: 01/28/2022 10:40 AM   Modules accepted: Orders

## 2022-02-09 ENCOUNTER — Ambulatory Visit: Payer: PPO | Admitting: Cardiovascular Disease

## 2022-02-11 ENCOUNTER — Encounter: Payer: Self-pay | Admitting: Internal Medicine

## 2022-02-19 ENCOUNTER — Other Ambulatory Visit: Payer: Self-pay | Admitting: Internal Medicine

## 2022-02-19 DIAGNOSIS — F331 Major depressive disorder, recurrent, moderate: Secondary | ICD-10-CM

## 2022-02-23 ENCOUNTER — Other Ambulatory Visit: Payer: Self-pay | Admitting: Internal Medicine

## 2022-02-23 ENCOUNTER — Other Ambulatory Visit (HOSPITAL_COMMUNITY): Payer: Self-pay

## 2022-02-23 ENCOUNTER — Encounter: Payer: Self-pay | Admitting: Internal Medicine

## 2022-02-23 DIAGNOSIS — E785 Hyperlipidemia, unspecified: Secondary | ICD-10-CM

## 2022-02-23 MED ORDER — ROSUVASTATIN CALCIUM 20 MG PO TABS: ORAL_TABLET | ORAL | 1 refills | Status: DC

## 2022-02-25 ENCOUNTER — Ambulatory Visit: Payer: PPO | Admitting: Internal Medicine

## 2022-03-01 ENCOUNTER — Other Ambulatory Visit: Payer: Self-pay | Admitting: Internal Medicine

## 2022-03-03 ENCOUNTER — Ambulatory Visit: Payer: PPO | Admitting: Internal Medicine

## 2022-03-06 ENCOUNTER — Other Ambulatory Visit: Payer: Self-pay | Admitting: Internal Medicine

## 2022-03-06 DIAGNOSIS — N401 Enlarged prostate with lower urinary tract symptoms: Secondary | ICD-10-CM

## 2022-03-10 ENCOUNTER — Other Ambulatory Visit: Payer: Self-pay | Admitting: Internal Medicine

## 2022-03-10 ENCOUNTER — Encounter: Payer: Self-pay | Admitting: Internal Medicine

## 2022-03-10 DIAGNOSIS — F900 Attention-deficit hyperactivity disorder, predominantly inattentive type: Secondary | ICD-10-CM

## 2022-03-10 MED ORDER — METHYLPHENIDATE HCL ER 20 MG PO TBCR: 20.0000 mg | EXTENDED_RELEASE_TABLET | Freq: Every day | ORAL | 0 refills | Status: DC

## 2022-03-16 ENCOUNTER — Encounter: Payer: Self-pay | Admitting: Internal Medicine

## 2022-03-16 ENCOUNTER — Ambulatory Visit (INDEPENDENT_AMBULATORY_CARE_PROVIDER_SITE_OTHER): Payer: PPO | Admitting: Internal Medicine

## 2022-03-16 VITALS — BP 122/68 | HR 60 | Temp 98.2°F | Ht 64.0 in | Wt 165.0 lb

## 2022-03-16 DIAGNOSIS — R351 Nocturia: Secondary | ICD-10-CM | POA: Diagnosis not present

## 2022-03-16 DIAGNOSIS — Z0001 Encounter for general adult medical examination with abnormal findings: Secondary | ICD-10-CM | POA: Diagnosis not present

## 2022-03-16 DIAGNOSIS — E785 Hyperlipidemia, unspecified: Secondary | ICD-10-CM

## 2022-03-16 DIAGNOSIS — E118 Type 2 diabetes mellitus with unspecified complications: Secondary | ICD-10-CM | POA: Diagnosis not present

## 2022-03-16 DIAGNOSIS — N401 Enlarged prostate with lower urinary tract symptoms: Secondary | ICD-10-CM

## 2022-03-16 DIAGNOSIS — Z23 Encounter for immunization: Secondary | ICD-10-CM | POA: Insufficient documentation

## 2022-03-16 DIAGNOSIS — I10 Essential (primary) hypertension: Secondary | ICD-10-CM | POA: Diagnosis not present

## 2022-03-16 DIAGNOSIS — G4762 Sleep related leg cramps: Secondary | ICD-10-CM

## 2022-03-16 LAB — LIPID PANEL
Cholesterol: 119 mg/dL (ref 0–200)
HDL: 47.3 mg/dL (ref >39.00)
LDL Cholesterol: 37 mg/dL (ref 0–99)
NonHDL: 71.65
Total CHOL/HDL Ratio: 3
Triglycerides: 173 mg/dL — ABNORMAL HIGH (ref 0.0–149.0)
VLDL: 34.6 mg/dL (ref 0.0–40.0)

## 2022-03-16 LAB — URINALYSIS, ROUTINE W REFLEX MICROSCOPIC
Bilirubin Urine: NEGATIVE
Hgb urine dipstick: NEGATIVE
Ketones, ur: NEGATIVE
Leukocytes,Ua: NEGATIVE
Nitrite: NEGATIVE
RBC / HPF: NONE SEEN (ref 0–?)
Specific Gravity, Urine: >=1.03 — AB (ref 1.000–1.030)
Total Protein, Urine: 30 — AB
Urine Glucose: NEGATIVE
Urobilinogen, UA: 0.2 (ref 0.0–1.0)
pH: 5.5 (ref 5.0–8.0)

## 2022-03-16 LAB — MICROALBUMIN / CREATININE URINE RATIO
Creatinine,U: 247.8 mg/dL
Microalb Creat Ratio: 4.3 mg/g (ref 0.0–30.0)
Microalb, Ur: 10.6 mg/dL — ABNORMAL HIGH (ref 0.0–1.9)

## 2022-03-16 LAB — HEMOGLOBIN A1C: Hgb A1c MFr Bld: 6.3 % (ref 4.6–6.5)

## 2022-03-16 LAB — PSA: PSA: 0.41 ng/mL (ref 0.10–4.00)

## 2022-03-16 MED ORDER — BOOSTRIX 5-2.5-18.5 LF-MCG/0.5 IM SUSP: 0.5000 mL | Freq: Once | INTRAMUSCULAR | 0 refills | Status: AC

## 2022-03-16 MED ORDER — SHINGRIX 50 MCG/0.5ML IM SUSR: 0.5000 mL | Freq: Once | INTRAMUSCULAR | 1 refills | Status: AC

## 2022-03-16 NOTE — Progress Notes (Signed)
? ?Subjective:  ?Patient ID: Robert Mccall, male    DOB: December 16, 1950  Age: 71 y.o. MRN: [id number] ? ?CC: Annual Exam, Coronary Artery Disease, Congestive Heart Failure, and Diabetes ? ? ?HPI ?Robert Mccall presents for a CPX. ? ?He has had some shortness of breath status post recent stents but he was told this is due to Robert Mccall.  He denies chest pain, diaphoresis, edema, or fatigue.  He complains of nocturnal foot cramps. ? ?Outpatient Medications Prior to Visit  ?Medication Sig Dispense Refill  ? alfuzosin (UROXATRAL) 10 MG 24 hr tablet TAKE ONE TABLET BY MOUTH DAILY IN THE MORNING WITH BREAKFAST 90 tablet 0  ? aspirin EC 81 MG tablet Take 1 tablet (81 mg total) by mouth daily. Swallow whole. 90 tablet 3  ? Cholecalciferol (VITAMIN D) 50 MCG (2000 UT) CAPS Take 1 capsule (2,000 Units total) by mouth daily. 30 capsule 11  ? DULoxetine (CYMBALTA) 30 MG capsule TAKE ONE CAPSULE BY MOUTH ONE TIME DAILY 90 capsule 0  ? esomeprazole (NEXIUM) 20 MG capsule Take 20 mg by mouth daily at 12 noon.    ? finasteride (PROSCAR) 5 MG tablet TAKE ONE TABLET BY MOUTH ONE TIME DAILY 90 tablet 0  ? Krill Oil 500 MG CAPS Take 1 capsule by mouth daily.    ? lamoTRIgine (LAMICTAL) 25 MG tablet TAKE ONE TABLET BY MOUTH TWICE DAILY 180 tablet 0  ? latanoprost (XALATAN) 0.005 % ophthalmic solution Place 1 drop into both eyes at bedtime.    ? methylphenidate (METADATE ER) 20 MG ER tablet Take 1 tablet (20 mg total) by mouth daily. 90 tablet 0  ? metoprolol tartrate (LOPRESSOR) 25 MG tablet TAKE ONE AND ONE-HALF TABLETS BY MOUTH TWICE DAILY 270 tablet 0  ? nitroGLYCERIN (NITROSTAT) 0.4 MG SL tablet Place 1 tablet (0.4 mg total) under the tongue every 5 (five) minutes as needed for chest pain (Call 911 if 3 doses does not relieve pain.). 25 tablet 3  ? olmesartan (BENICAR) 40 MG tablet TAKE 1 TABLET(40 MG) BY MOUTH DAILY 90 tablet 1  ? Probiotic Product (PROBIOTIC-10 PO) Take 1 capsule by mouth daily.    ? rosuvastatin (CRESTOR) 20  MG tablet TAKE 1 TABLET(20 MG) BY MOUTH DAILY 90 tablet 1  ? Testosterone 20.25 MG/ACT (1.62%) GEL APPLY 2 ACTUATIONS ONTO THE SKIN DAILY 75 g 1  ? ticagrelor (BRILINTA) 90 MG TABS tablet Take 1 tablet (90 mg total) by mouth 2 (two) times daily. 60 tablet 5  ? timolol (TIMOPTIC) 0.5 % ophthalmic solution INT 1 GTT IN EACH EYE QAM  0  ? vitamin B-12 (CYANOCOBALAMIN) 1000 MCG tablet Take 1,000 mcg by mouth daily.    ? ?No facility-administered medications prior to visit.  ? ? ?ROS ?Review of Systems  ?Constitutional: Negative.   ?HENT: Negative.    ?Eyes: Negative.   ?Respiratory:  Positive for shortness of breath. Negative for cough, chest tightness and wheezing.   ?Cardiovascular:  Negative for chest pain, palpitations and leg swelling.  ?Gastrointestinal:  Negative for abdominal pain, constipation, diarrhea, nausea and vomiting.  ?Endocrine: Negative.   ?Genitourinary:  Negative for difficulty urinating and dysuria.  ?Musculoskeletal:  Negative for arthralgias and myalgias.  ?Skin: Negative.  Negative for color change.  ?Neurological: Negative.  Negative for dizziness, weakness and light-headedness.  ?Hematological:  Negative for adenopathy. Does not bruise/bleed easily.  ?Psychiatric/Behavioral: Negative.    ? ?Objective:  ?BP 122/68 (BP Location: Left Arm, Patient Position: Sitting, Cuff Size: Large)  Pulse 60   Temp 98.2 ?F (36.8 ?C) (Oral)   Ht '5\' 4"'$  (1.626 m)   Wt 165 lb (74.8 kg)   SpO2 96%   BMI 28.32 kg/m?  ? ?BP Readings from Last 3 Encounters:  ?03/16/22 122/68  ?01/27/22 108/78  ?01/23/22 116/68  ? ? ?Wt Readings from Last 3 Encounters:  ?03/16/22 165 lb (74.8 kg)  ?01/27/22 166 lb 9.6 oz (75.6 kg)  ?01/23/22 166 lb 2 oz (75.4 kg)  ? ? ?Physical Exam ?Vitals reviewed.  ?HENT:  ?   Nose: Nose normal.  ?   Mouth/Throat:  ?   Mouth: Mucous membranes are moist.  ?Eyes:  ?   General: No scleral icterus. ?   Conjunctiva/sclera: Conjunctivae normal.  ?Cardiovascular:  ?   Rate and Rhythm: Normal rate and  regular rhythm.  ?   Heart sounds: No murmur heard. ?Pulmonary:  ?   Effort: Pulmonary effort is normal.  ?   Breath sounds: No stridor. No wheezing, rhonchi or rales.  ?Abdominal:  ?   General: Abdomen is flat.  ?   Palpations: There is no mass.  ?   Tenderness: There is no abdominal tenderness. There is no guarding or rebound.  ?   Hernia: No hernia is present. There is no hernia in the left inguinal area or right inguinal area.  ?Genitourinary: ?   Pubic Area: No rash.   ?   Penis: Normal and circumcised.   ?   Testes: Normal.  ?   Epididymis:  ?   Right: Normal.  ?   Left: Normal.  ?   Prostate: Enlarged. Not tender and no nodules present.  ?   Rectum: Normal. Guaiac result negative. No mass, tenderness, anal fissure, external hemorrhoid or internal hemorrhoid. Normal anal tone.  ?Musculoskeletal:     ?   General: Normal range of motion.  ?   Cervical back: Neck supple.  ?   Right lower leg: No edema.  ?   Left lower leg: No edema.  ?Lymphadenopathy:  ?   Cervical: No cervical adenopathy.  ?   Lower Body: No right inguinal adenopathy. No left inguinal adenopathy.  ?Skin: ?   General: Skin is warm and dry.  ?Neurological:  ?   General: No focal deficit present.  ?   Mental Status: He is alert.  ? ? ?Lab Results  ?Component Value Date  ? WBC 5.0 01/02/2022  ? HGB 14.1 01/02/2022  ? HCT 40.8 01/02/2022  ? PLT 226 01/02/2022  ? GLUCOSE 111 (H) 01/02/2022  ? CHOL 119 03/16/2022  ? TRIG 173.0 (H) 03/16/2022  ? HDL 47.30 03/16/2022  ? LDLDIRECT 76.0 11/29/2019  ? LDLCALC 37 03/16/2022  ? ALT 41 09/05/2021  ? AST 31 09/05/2021  ? NA 136 01/02/2022  ? K 4.3 01/02/2022  ? CL 101 01/02/2022  ? CREATININE 1.01 01/02/2022  ? BUN 14 01/02/2022  ? CO2 25 01/02/2022  ? TSH 1.90 02/25/2021  ? PSA 0.41 03/16/2022  ? INR 1.04 01/23/2017  ? HGBA1C 6.3 03/16/2022  ? MICROALBUR 10.6 (H) 03/16/2022  ? ? ?CARDIAC CATHETERIZATION ? ?Result Date: 01/07/2022 ?  Mid LAD lesion is 90% stenosed with 90% stenosed side branch in 2nd Diag.    Balloon angioplasty was performed on the ostium of the diagonal branch, using a BALLN SAPPHIRE 2.0X12.   A drug-eluting stent was successfully placed covering the LAD lesion and crossing the diagonal sidebranch, using a STENT ONYX FRONTIER 2.75X15. ->  Deployed a  2.8 mm, with proximal optimization to 3.1 mm in the proximal half of the stent   Post intervention, there is a 0% residual stenosis in the LAD.   Post intervention, the side branch was reduced to 30% residual stenosis.  With mild ulceration noted but no dissection.   ----------------------------   Otherwise normal 1st Diag, remainder of LAD and 2nd Diag along with LCx and RCA.   The left ventricular systolic function is normal.  The left ventricular ejection fraction is 55-65% by visual estimate.   LV end diastolic pressure is normal.   There is no aortic valve stenosis. SUMMARY Severe bifurcation LAD-D2 90% stenosis ([place] 1, 1, 1) as culprit lesion Successful PTCA of ostial D2 followed by DES PCI of LAD (ONYX FRONTIER 2.75 mm x 15 mm-deployed to 2.8 mm with proximal optimization to 3.1 mm) - Crossing the D2, and maintaining TIMI-3 flow reducing to 0% in the LAD and 30 % in the D2 ostium Otherwise angiographically normal coronary arteries Relative normal EF with poor imaging. Normal LVEDP. RECOMMENDATIONS Continue aggressive risk factor modification-consider increasing statin dose Continue beta-blocker and ARB-defer titration to primary cardiologist DAPT as noted [name], MD ? ? ?Assessment & Plan:  ? ?Robert Mccall was seen today for annual exam, coronary artery disease, congestive heart failure and diabetes. ? ?Diagnoses and all orders for this visit: ? ?Essential hypertension- His blood pressure is adequately well controlled. ?-     Urinalysis, Routine w reflex microscopic; Future ?-     Urinalysis, Routine w reflex microscopic ? ?Type II diabetes mellitus with manifestations ([place])- His blood sugar is well controlled. ?-     Microalbumin / creatinine  urine ratio; Future ?-     Hemoglobin A1c; Future ?-     Hemoglobin A1c ?-     Microalbumin / creatinine urine ratio ? ?Dyslipidemia, goal LDL below 70- LDL goal achieved. Doing well on the statin  ?-     Lipid pan

## 2022-03-16 NOTE — Patient Instructions (Signed)

## 2022-03-20 DIAGNOSIS — Z0001 Encounter for general adult medical examination with abnormal findings: Secondary | ICD-10-CM | POA: Insufficient documentation

## 2022-03-20 DIAGNOSIS — G4762 Sleep related leg cramps: Secondary | ICD-10-CM | POA: Insufficient documentation

## 2022-03-20 MED ORDER — MAGNESIUM OXIDE 250 MG PO TABS: 1.0000 | ORAL_TABLET | Freq: Every day | ORAL | 1 refills | Status: DC

## 2022-03-22 ENCOUNTER — Other Ambulatory Visit: Payer: Self-pay | Admitting: Internal Medicine

## 2022-04-06 ENCOUNTER — Other Ambulatory Visit (HOSPITAL_COMMUNITY): Payer: Self-pay

## 2022-04-10 ENCOUNTER — Other Ambulatory Visit: Payer: Self-pay | Admitting: Internal Medicine

## 2022-04-10 DIAGNOSIS — I10 Essential (primary) hypertension: Secondary | ICD-10-CM

## 2022-04-13 ENCOUNTER — Encounter: Payer: Self-pay | Admitting: Cardiovascular Disease

## 2022-04-24 ENCOUNTER — Other Ambulatory Visit: Payer: Self-pay | Admitting: Internal Medicine

## 2022-04-24 DIAGNOSIS — N401 Enlarged prostate with lower urinary tract symptoms: Secondary | ICD-10-CM

## 2022-04-27 ENCOUNTER — Other Ambulatory Visit: Payer: Self-pay | Admitting: Internal Medicine

## 2022-04-27 ENCOUNTER — Encounter: Payer: Self-pay | Admitting: Internal Medicine

## 2022-04-27 DIAGNOSIS — I209 Angina pectoris, unspecified: Secondary | ICD-10-CM

## 2022-04-27 DIAGNOSIS — N401 Enlarged prostate with lower urinary tract symptoms: Secondary | ICD-10-CM

## 2022-04-27 DIAGNOSIS — F900 Attention-deficit hyperactivity disorder, predominantly inattentive type: Secondary | ICD-10-CM

## 2022-04-27 DIAGNOSIS — I2510 Atherosclerotic heart disease of native coronary artery without angina pectoris: Secondary | ICD-10-CM

## 2022-04-27 DIAGNOSIS — F331 Major depressive disorder, recurrent, moderate: Secondary | ICD-10-CM

## 2022-04-27 MED ORDER — ALFUZOSIN HCL ER 10 MG PO TB24: 10.0000 mg | ORAL_TABLET | Freq: Every day | ORAL | 1 refills | Status: DC

## 2022-04-27 MED ORDER — METHYLPHENIDATE HCL ER 20 MG PO TBCR: 20.0000 mg | EXTENDED_RELEASE_TABLET | Freq: Every day | ORAL | 0 refills | Status: DC

## 2022-04-27 MED ORDER — FINASTERIDE 5 MG PO TABS: 5.0000 mg | ORAL_TABLET | Freq: Every day | ORAL | 0 refills | Status: DC

## 2022-04-27 MED ORDER — DULOXETINE HCL 30 MG PO CPEP: 30.0000 mg | ORAL_CAPSULE | Freq: Every day | ORAL | 0 refills | Status: DC

## 2022-04-27 MED ORDER — NITROGLYCERIN 0.4 MG SL SUBL: 0.4000 mg | SUBLINGUAL_TABLET | SUBLINGUAL | 3 refills | Status: DC | PRN

## 2022-04-27 MED ORDER — METOPROLOL TARTRATE 25 MG PO TABS: 37.5000 mg | ORAL_TABLET | Freq: Two times a day (BID) | ORAL | 0 refills | Status: DC

## 2022-04-27 MED ORDER — LAMOTRIGINE 25 MG PO TABS: 25.0000 mg | ORAL_TABLET | Freq: Two times a day (BID) | ORAL | 0 refills | Status: DC

## 2022-04-29 ENCOUNTER — Other Ambulatory Visit (HOSPITAL_COMMUNITY): Payer: Self-pay

## 2022-05-05 ENCOUNTER — Ambulatory Visit: Payer: PPO | Admitting: Cardiovascular Disease

## 2022-05-05 ENCOUNTER — Other Ambulatory Visit (HOSPITAL_COMMUNITY): Payer: Self-pay

## 2022-05-26 ENCOUNTER — Other Ambulatory Visit (HOSPITAL_COMMUNITY): Payer: Self-pay

## 2022-05-28 ENCOUNTER — Other Ambulatory Visit (HOSPITAL_COMMUNITY): Payer: Self-pay

## 2022-06-15 ENCOUNTER — Encounter: Payer: Self-pay | Admitting: Cardiovascular Disease

## 2022-06-15 NOTE — Progress Notes (Unsigned)
Cardiology Office Note:    Date:  06/15/2022   ID:  Robert Mccall, DOB 1951-08-03, MRN [id number]  PCP:  [name], MD  Cardiologist:  Mertie Moores, MD   Referring MD: [name], MD   Problem List 1. TIA 2. Chronic diastolic CHF 3. HTN 4. Hyperlipidemia    Chief Complaint  Patient presents with   Congestive Heart Failure         April 04, 2018:     Robert Mccall is a 71 y.o. male with a hx of hypertension, hyperlipidemia,  Chronic diastolic CHF  Resents for further evaluation of chest discomfort.  Tracks his BP regularly ,  BP cuff will alert him if his heart rate is irregular.  He can feel these palpitations.  They occur spontaneously and might last for as long as 15 to 20 minutes.  They typically resolve spontaneously.  These are not associated with any specific activity.  They are not associated with any particular time a day.  Does not do any regular exercise .   Works in [employer].   Oct. 23, 2019:  Has been having trouble with his hearing that she has Mnire's disease.  He denies any episodes of chest pain or shortness of breath.  Blood pressure and heart rate are well controlled today.  Lipids from last week look good on Crestor 10 a day   Nov. 2, 2020 Ashur is seen today for follow up of his HTN and chronic diastolic CHF No cp, no dyspnea.   Not exercising  Much.   Works at [employer].   May 27, 2020: Robert Mccall is seen today for follow-up for his hypertension and chronic diastolic congestive heart failure. No CP or dyspnea.   He is very hard of hearing  -    We ordered a coronary CT angiogram.  Coronary calcium score of 523. This was 76th percentile for age and sex matched control. Coronary angiogram revealed moderate disease in the proximal/mid LAD. There was concern for possible obstruction just distal to the first diagonal at the level of the second diagonal.  There is no disease in the LAD distal to the  second diagonal.  FFR did not show any significant obstructive disease.   We discussed the importance of lipid management.  His most recent labs are from December, 2020.  His LDL is 76.  The triglyceride level is 299.  The total cholesterol is 157.  The HDL is 35.7.  No angina .   Has some CP at night when he is lying down.    Is not getting regular exercise - lots of back issues.    Dec. 13, 2021: Robert Mccall is seen today for follow up of his moderate CAD, chronic diastolic CHF Has coronary calcium score is 523 Had a random CP one night.  Likely was indigestion    Jan. 16, 2023 Robert Mccall is seen today for follow up of his CAD, chronic diastolic CHF He has been having chest pain  - may be related to stress Coronary CT angio from May, 2021 shows a CAC score of 523. 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  Is not exercising  CP does not worsen with exertion - does not do much, limited by back issues.  Radiates  through to his back Tried NTG 1 day - did not relieve his CP .   I considered myoview study -  he has known moderate disease so im not sure that this would be beneficial   June 16, 2022 Robert Mccall is seen today for follow up of his CAD, chronic diastolic CHF. He had a cath Feb. 1, 2023 Successful stenting of the prox LAD,  PTCA of the diag    Past Medical History:  Diagnosis Date   ADHD (attention deficit hyperactivity disorder)    Anxiety    Benign prostatic hypertrophy    Chronic lower back pain    surgery schedule for back 10/21/16   Cyst of right kidney    incidental Bos[place] 1.3cm on R (MRI abd 09/2014), follows with uro   Depression    GERD (gastroesophageal reflux disease)    Glaucoma    Hard of hearing    History of migraine headaches    no meds   Hyperlipidemia    Hypertension    Menetrier's disease      Past Surgical History:  Procedure Laterality Date   COLONOSCOPY  09/2007   polyps/ Deatra Ina   HYDROCELE EXCISION Right 1990   LEFT HEART CATH AND CORONARY ANGIOGRAPHY N/A 01/07/2022   Procedure: LEFT HEART CATH AND CORONARY ANGIOGRAPHY;  Surgeon: Leonie Man, MD;  Location: [facility];  Service: Cardiovascular;  Laterality: N/A;   LUMBAR DISC SURGERY  10/21/2016   ORCHIECTOMY Left 1970   WISDOM TOOTH EXTRACTION      Current Medications: No outpatient medications have been marked as taking for the 06/16/22 encounter (Office Visit) with Taren Toops, Wonda Cheng, MD.     Allergies:   Patient has no known allergies.   Social History   Socioeconomic History   Marital status: Single    Spouse name: Not on file   Number of children: Not on file   Years of education: Not on file   Highest education level: Not on file  Occupational History   Occupation: Freight forwarder    Comment: [employer]  Tobacco Use   Smoking status: Never   Smokeless tobacco: Never  Vaping Use   Vaping Use: Never used  Substance and Sexual Activity   Alcohol use: Yes    Comment: occ   Drug use: No   Sexual activity: Yes    Comment: working with replacement  Other Topics Concern   Not on file  Social History Narrative   Not on file   Social Determinants of Health   Financial Resource Strain: Low Risk  (01/11/2021)   Overall Financial Resource Strain (CARDIA)    Difficulty of Paying Living Expenses: Not hard at all  Food Insecurity: Not on file  Transportation Needs: Not on file  Physical Activity: Not on file  Stress: Not on file  Social Connections: Not on file     Family History: The patient's family history includes Breast cancer in his maternal grandmother; Breast cancer (age of onset: 67) in his mother; COPD in his mother; Cancer in his father; Depression in his mother; Diabetes in his father; Heart disease in his mother; Macular degeneration in his father; Osteoarthritis in  his mother. There is no history of Colon cancer, Esophageal cancer, Rectal cancer, or Stomach cancer.  ROS:   Please see the history of present illness.     All other systems reviewed and are negative.  EKGs/Labs/Other Studies Reviewed:    The following studies were reviewed today:  Recent Labs: 08/28/2021: Pro B Natriuretic peptide (BNP) 24.0 09/05/2021: ALT 41 01/02/2022: BUN 14; Creatinine, Ser 1.01; Hemoglobin 14.1; Platelets 226; Potassium 4.3; Sodium 136  Recent Lipid Panel    Component Value Date/Time   CHOL 119 03/16/2022 1006   CHOL 107 05/27/2020 1239   TRIG 173.0 (H) 03/16/2022 1006   HDL 47.30 03/16/2022 1006   HDL 31 (L) 05/27/2020 1239   CHOLHDL 3 03/16/2022 1006   VLDL 34.6 03/16/2022 1006   LDLCALC 37 03/16/2022 1006   LDLCALC 52 05/27/2020 1239   LDLDIRECT 76.0 11/29/2019 0901    Physical Exam: There were no vitals taken for this visit.  GEN:  Well nourished, well developed in no acute distress HEENT: Normal NECK: No JVD; No carotid bruits LYMPHATICS: No lymphadenopathy CARDIAC: RRR ***, no murmurs, rubs, gallops RESPIRATORY:  Clear to auscultation without rales, wheezing or rhonchi  ABDOMEN: Soft, non-tender, non-distended MUSCULOSKELETAL:  No edema; No deformity  SKIN: Warm and dry NEUROLOGIC:  Alert and oriented x 3     EKG:        ASSESSMENT:    No diagnosis found.   PLAN:     1. Hypertension-            3.  Unstable angina :          4.   Chronic diastolic CHF:             Medication Adjustments/Labs and Tests Ordered: Current medicines are reviewed at length with the patient today.  Concerns regarding medicines are outlined above.  No orders of the defined types were placed in this encounter.  No orders of the defined types were placed in this encounter.    There are no Patient Instructions on file for this visit.   Signed, Mertie Moores, MD  06/15/2022 8:05 PM    [place]

## 2022-06-16 ENCOUNTER — Encounter: Payer: Self-pay | Admitting: Cardiovascular Disease

## 2022-06-16 ENCOUNTER — Ambulatory Visit: Payer: PPO | Admitting: Cardiovascular Disease

## 2022-06-16 VITALS — BP 126/82 | HR 68 | Ht 64.0 in | Wt 165.2 lb

## 2022-06-16 DIAGNOSIS — I2510 Atherosclerotic heart disease of native coronary artery without angina pectoris: Secondary | ICD-10-CM | POA: Diagnosis not present

## 2022-06-16 DIAGNOSIS — E785 Hyperlipidemia, unspecified: Secondary | ICD-10-CM

## 2022-06-16 NOTE — Patient Instructions (Signed)
Medication Instructions:  Your physician recommends that you continue on your current medications as directed. Please refer to the Current Medication list given to you today.  *If you need a refill on your cardiac medications before your next appointment, please call your pharmacy*   Lab Work: NONE If you have labs (blood work) drawn today and your tests are completely normal, you will receive your results only by: [place] (if you have MyChart) OR A paper copy in the mail If you have any lab test that is abnormal or we need to change your treatment, we will call you to review the results.   Testing/Procedures: NONE   Follow-Up: At [facility], you and your health needs are our priority.  As part of our continuing mission to provide you with exceptional heart care, we have created designated Provider Care Teams.  These Care Teams include your primary Cardiologist (physician) and Advanced Practice Providers (APPs -  Physician Assistants and Nurse Practitioners) who all work together to provide you with the care you need, when you need it.  Your next appointment:   February 2024  The format for your next appointment:   In Person  Provider:   Ronn Melena, or Nahser {    Important Information About Sugar

## 2022-06-17 DIAGNOSIS — H43813 Vitreous degeneration, bilateral: Secondary | ICD-10-CM | POA: Diagnosis not present

## 2022-06-17 DIAGNOSIS — H2513 Age-related nuclear cataract, bilateral: Secondary | ICD-10-CM | POA: Diagnosis not present

## 2022-06-17 DIAGNOSIS — H401131 Primary open-angle glaucoma, bilateral, mild stage: Secondary | ICD-10-CM | POA: Diagnosis not present

## 2022-06-17 LAB — HM DIABETES EYE EXAM

## 2022-06-24 ENCOUNTER — Other Ambulatory Visit: Payer: Self-pay | Admitting: Cardiology

## 2022-06-24 ENCOUNTER — Other Ambulatory Visit (HOSPITAL_COMMUNITY): Payer: Self-pay

## 2022-06-24 MED ORDER — TICAGRELOR 90 MG PO TABS
90.0000 mg | ORAL_TABLET | Freq: Two times a day (BID) | ORAL | 5 refills | Status: DC
  Filled 2022-06-24 – 2022-07-09 (×2): qty 60, 30d supply, fill #0
  Filled 2022-08-05: qty 60, 30d supply, fill #1
  Filled 2022-09-03: qty 60, 30d supply, fill #2
  Filled 2022-10-03: qty 60, 30d supply, fill #3
  Filled 2022-11-02: qty 60, 30d supply, fill #4
  Filled 2022-12-01: qty 60, 30d supply, fill #5

## 2022-07-09 ENCOUNTER — Other Ambulatory Visit (HOSPITAL_COMMUNITY): Payer: Self-pay

## 2022-07-12 ENCOUNTER — Encounter: Payer: Self-pay | Admitting: Internal Medicine

## 2022-07-13 ENCOUNTER — Other Ambulatory Visit: Payer: Self-pay | Admitting: Internal Medicine

## 2022-07-13 DIAGNOSIS — N401 Enlarged prostate with lower urinary tract symptoms: Secondary | ICD-10-CM

## 2022-07-13 DIAGNOSIS — I10 Essential (primary) hypertension: Secondary | ICD-10-CM

## 2022-07-13 DIAGNOSIS — K219 Gastro-esophageal reflux disease without esophagitis: Secondary | ICD-10-CM

## 2022-07-13 DIAGNOSIS — I25119 Atherosclerotic heart disease of native coronary artery with unspecified angina pectoris: Secondary | ICD-10-CM

## 2022-07-13 DIAGNOSIS — E785 Hyperlipidemia, unspecified: Secondary | ICD-10-CM

## 2022-07-13 DIAGNOSIS — F331 Major depressive disorder, recurrent, moderate: Secondary | ICD-10-CM

## 2022-07-13 DIAGNOSIS — F3342 Major depressive disorder, recurrent, in full remission: Secondary | ICD-10-CM

## 2022-07-13 DIAGNOSIS — I2510 Atherosclerotic heart disease of native coronary artery without angina pectoris: Secondary | ICD-10-CM

## 2022-07-13 MED ORDER — LAMOTRIGINE 25 MG PO TABS: 25.0000 mg | ORAL_TABLET | Freq: Two times a day (BID) | ORAL | 0 refills | Status: DC

## 2022-07-13 MED ORDER — FINASTERIDE 5 MG PO TABS: 5.0000 mg | ORAL_TABLET | Freq: Every day | ORAL | 0 refills | Status: DC

## 2022-07-13 MED ORDER — METOPROLOL TARTRATE 25 MG PO TABS: 37.5000 mg | ORAL_TABLET | Freq: Two times a day (BID) | ORAL | 1 refills | Status: DC

## 2022-07-13 MED ORDER — ESOMEPRAZOLE MAGNESIUM 20 MG PO CPDR: 20.0000 mg | DELAYED_RELEASE_CAPSULE | Freq: Every day | ORAL | 0 refills | Status: DC

## 2022-07-13 MED ORDER — OLMESARTAN MEDOXOMIL 40 MG PO TABS: ORAL_TABLET | ORAL | 1 refills | Status: DC

## 2022-07-13 MED ORDER — ROSUVASTATIN CALCIUM 20 MG PO TABS: 20.0000 mg | ORAL_TABLET | Freq: Every day | ORAL | 1 refills | Status: DC

## 2022-07-13 MED ORDER — DULOXETINE HCL 30 MG PO CPEP: 30.0000 mg | ORAL_CAPSULE | Freq: Every day | ORAL | 1 refills | Status: DC

## 2022-07-13 MED ORDER — ALFUZOSIN HCL ER 10 MG PO TB24: 10.0000 mg | ORAL_TABLET | Freq: Every day | ORAL | 1 refills | Status: DC

## 2022-07-20 ENCOUNTER — Telehealth: Payer: PPO

## 2022-07-22 DIAGNOSIS — H52223 Regular astigmatism, bilateral: Secondary | ICD-10-CM | POA: Diagnosis not present

## 2022-07-22 DIAGNOSIS — H524 Presbyopia: Secondary | ICD-10-CM | POA: Diagnosis not present

## 2022-07-22 DIAGNOSIS — H5203 Hypermetropia, bilateral: Secondary | ICD-10-CM | POA: Diagnosis not present

## 2022-07-22 DIAGNOSIS — H43813 Vitreous degeneration, bilateral: Secondary | ICD-10-CM | POA: Diagnosis not present

## 2022-07-22 DIAGNOSIS — H401131 Primary open-angle glaucoma, bilateral, mild stage: Secondary | ICD-10-CM | POA: Diagnosis not present

## 2022-07-22 DIAGNOSIS — H2513 Age-related nuclear cataract, bilateral: Secondary | ICD-10-CM | POA: Diagnosis not present

## 2022-08-05 ENCOUNTER — Other Ambulatory Visit (HOSPITAL_COMMUNITY): Payer: Self-pay

## 2022-09-02 ENCOUNTER — Other Ambulatory Visit: Payer: Self-pay | Admitting: Internal Medicine

## 2022-09-02 DIAGNOSIS — E785 Hyperlipidemia, unspecified: Secondary | ICD-10-CM

## 2022-09-03 ENCOUNTER — Other Ambulatory Visit (HOSPITAL_COMMUNITY): Payer: Self-pay

## 2022-09-07 ENCOUNTER — Telehealth: Payer: Self-pay | Admitting: Internal Medicine

## 2022-09-07 NOTE — Telephone Encounter (Signed)
LVM for pt to rtn my call to schedule AWV-I with [facility] call back # [phone] 

## 2022-09-11 ENCOUNTER — Inpatient Hospital Stay: Payer: PPO | Attending: Hematology and Oncology

## 2022-09-11 DIAGNOSIS — R944 Abnormal results of kidney function studies: Secondary | ICD-10-CM | POA: Diagnosis not present

## 2022-09-11 DIAGNOSIS — Z7902 Long term (current) use of antithrombotics/antiplatelets: Secondary | ICD-10-CM | POA: Insufficient documentation

## 2022-09-11 DIAGNOSIS — D649 Anemia, unspecified: Secondary | ICD-10-CM | POA: Diagnosis not present

## 2022-09-11 DIAGNOSIS — Z79899 Other long term (current) drug therapy: Secondary | ICD-10-CM | POA: Diagnosis not present

## 2022-09-11 DIAGNOSIS — Z7982 Long term (current) use of aspirin: Secondary | ICD-10-CM | POA: Diagnosis not present

## 2022-09-11 DIAGNOSIS — D472 Monoclonal gammopathy: Secondary | ICD-10-CM | POA: Insufficient documentation

## 2022-09-11 LAB — COMPREHENSIVE METABOLIC PANEL
ALT: 40 U/L (ref 0–44)
AST: 31 U/L (ref 15–41)
Albumin: 4.3 g/dL (ref 3.5–5.0)
Alkaline Phosphatase: 69 U/L (ref 38–126)
Anion gap: 6 (ref 5–15)
BUN: 25 mg/dL — ABNORMAL HIGH (ref 8–23)
CO2: 25 mmol/L (ref 22–32)
Calcium: 8.7 mg/dL — ABNORMAL LOW (ref 8.9–10.3)
Chloride: 106 mmol/L (ref 98–111)
Creatinine, Ser: 1.2 mg/dL (ref 0.61–1.24)
GFR, Estimated: >60 mL/min (ref >60)
Glucose, Bld: 132 mg/dL — ABNORMAL HIGH (ref 70–99)
Potassium: 4.3 mmol/L (ref 3.5–5.1)
Sodium: 137 mmol/L (ref 135–145)
Total Bilirubin: 0.5 mg/dL (ref 0.3–1.2)
Total Protein: 7.4 g/dL (ref 6.5–8.1)

## 2022-09-11 LAB — CBC WITH DIFFERENTIAL/PLATELET
Abs Immature Granulocytes: 0.03 10*3/uL (ref 0.00–0.07)
Basophils Absolute: 0 10*3/uL (ref 0.0–0.1)
Basophils Relative: 1 %
Eosinophils Absolute: 0.2 10*3/uL (ref 0.0–0.5)
Eosinophils Relative: 3 %
HCT: 35.9 % — ABNORMAL LOW (ref 39.0–52.0)
Hemoglobin: 12.7 g/dL — ABNORMAL LOW (ref 13.0–17.0)
Immature Granulocytes: 0 %
Lymphocytes Relative: 31 %
Lymphs Abs: 2.6 10*3/uL (ref 0.7–4.0)
MCH: 30.5 pg (ref 26.0–34.0)
MCHC: 35.4 g/dL (ref 30.0–36.0)
MCV: 86.1 fL (ref 80.0–100.0)
Monocytes Absolute: 1 10*3/uL (ref 0.1–1.0)
Monocytes Relative: 12 %
Neutro Abs: 4.4 10*3/uL (ref 1.7–7.7)
Neutrophils Relative %: 53 %
Platelets: 256 10*3/uL (ref 150–400)
RBC: 4.17 MIL/uL — ABNORMAL LOW (ref 4.22–5.81)
RDW: 13 % (ref 11.5–15.5)
WBC: 8.4 10*3/uL (ref 4.0–10.5)
nRBC: 0 % (ref 0.0–0.2)

## 2022-09-14 LAB — KAPPA/LAMBDA LIGHT CHAINS
Kappa free light chain: 20.7 mg/L — ABNORMAL HIGH (ref 3.3–19.4)
Kappa, lambda light chain ratio: 1.15 (ref 0.26–1.65)
Lambda free light chains: 18 mg/L (ref 5.7–26.3)

## 2022-09-16 ENCOUNTER — Ambulatory Visit (INDEPENDENT_AMBULATORY_CARE_PROVIDER_SITE_OTHER): Payer: PPO | Admitting: Internal Medicine

## 2022-09-16 ENCOUNTER — Encounter: Payer: Self-pay | Admitting: Internal Medicine

## 2022-09-16 VITALS — BP 122/76 | HR 74 | Temp 97.9°F | Ht 64.0 in | Wt 162.0 lb

## 2022-09-16 DIAGNOSIS — E118 Type 2 diabetes mellitus with unspecified complications: Secondary | ICD-10-CM

## 2022-09-16 DIAGNOSIS — D539 Nutritional anemia, unspecified: Secondary | ICD-10-CM | POA: Diagnosis not present

## 2022-09-16 DIAGNOSIS — E538 Deficiency of other specified B group vitamins: Secondary | ICD-10-CM | POA: Diagnosis not present

## 2022-09-16 DIAGNOSIS — I10 Essential (primary) hypertension: Secondary | ICD-10-CM | POA: Diagnosis not present

## 2022-09-16 DIAGNOSIS — Z23 Encounter for immunization: Secondary | ICD-10-CM

## 2022-09-16 LAB — FOLATE: Folate: >23.9 ng/mL (ref >5.9)

## 2022-09-16 LAB — HEMOGLOBIN A1C: Hgb A1c MFr Bld: 6.7 % — ABNORMAL HIGH (ref 4.6–6.5)

## 2022-09-16 LAB — VITAMIN B12: Vitamin B-12: 900 pg/mL (ref 211–911)

## 2022-09-16 NOTE — Progress Notes (Unsigned)
Subjective:  Patient ID: Robert Mccall, male    DOB: October 27, 1951  Age: 71 y.o. MRN: [id number]  CC: Anemia and Diabetes   HPI Sanjeev Main presents for f/up -  His recent labs revealed a normochromic normocytic anemia.  He complains of fatigue and poor appetite but denies chest pain, shortness of breath, paresthesias, or sources of blood loss.  Outpatient Medications Prior to Visit  Medication Sig Dispense Refill   alfuzosin (UROXATRAL) 10 MG 24 hr tablet Take 1 tablet (10 mg total) by mouth daily with breakfast. 90 tablet 1   aspirin EC 81 MG tablet Take 1 tablet (81 mg total) by mouth daily. Swallow whole. 90 tablet 3   Cholecalciferol (VITAMIN D) 50 MCG (2000 UT) CAPS Take 1 capsule (2,000 Units total) by mouth daily. 30 capsule 11   Docusate Sodium 100 MG capsule Take 100 mg by mouth 2 (two) times daily.     DULoxetine (CYMBALTA) 30 MG capsule Take 1 capsule (30 mg total) by mouth daily. 90 capsule 1   esomeprazole (NEXIUM) 20 MG capsule Take 1 capsule (20 mg total) by mouth daily at 12 noon. 90 capsule 0   finasteride (PROSCAR) 5 MG tablet Take 1 tablet (5 mg total) by mouth daily. 90 tablet 0   Krill Oil 500 MG CAPS Take 1 capsule by mouth daily.     lamoTRIgine (LAMICTAL) 25 MG tablet Take 1 tablet (25 mg total) by mouth 2 (two) times daily. 180 tablet 0   latanoprost (XALATAN) 0.005 % ophthalmic solution Place 1 drop into both eyes at bedtime.     methylphenidate (METADATE ER) 20 MG ER tablet Take 1 tablet (20 mg total) by mouth daily. 90 tablet 0   metoprolol tartrate (LOPRESSOR) 25 MG tablet Take 1.5 tablets (37.5 mg total) by mouth 2 (two) times daily. 270 tablet 1   nitroGLYCERIN (NITROSTAT) 0.4 MG SL tablet Place 1 tablet (0.4 mg total) under the tongue every 5 (five) minutes as needed for chest pain (Call 911 if 3 doses does not relieve pain.). 25 tablet 3   olmesartan (BENICAR) 40 MG tablet TAKE 1 TABLET(40 MG) BY MOUTH DAILY 90 tablet 1   Probiotic Product  (PROBIOTIC-10 PO) Take 1 capsule by mouth daily.     rosuvastatin (CRESTOR) 20 MG tablet TAKE 1 TABLET(20 MG) BY MOUTH DAILY 90 tablet 1   Testosterone 20.25 MG/ACT (1.62%) GEL APPLY 2 ACTUATIONS ONTO THE SKIN DAILY 75 g 1   ticagrelor (BRILINTA) 90 MG TABS tablet Take 1 tablet by mouth 2 times daily. 60 tablet 5   timolol (TIMOPTIC) 0.5 % ophthalmic solution INT 1 GTT IN EACH EYE QAM  0   vitamin B-12 (CYANOCOBALAMIN) 1000 MCG tablet Take 1,000 mcg by mouth daily.     No facility-administered medications prior to visit.    ROS Review of Systems  Constitutional:  Positive for fatigue. Negative for appetite change, chills, diaphoresis, fever and unexpected weight change.  HENT: Negative.    Respiratory: Negative.  Negative for cough, shortness of breath and wheezing.   Cardiovascular:  Negative for chest pain, palpitations and leg swelling.  Gastrointestinal:  Negative for abdominal pain, blood in stool, constipation, diarrhea, nausea and vomiting.  Endocrine: Negative.   Genitourinary: Negative.  Negative for difficulty urinating.  Musculoskeletal:  Negative for arthralgias, back pain and myalgias.  Skin: Negative.  Negative for color change.  Neurological: Negative.  Negative for dizziness, weakness, light-headedness and numbness.  Hematological:  Negative for adenopathy. Does not  bruise/bleed easily.  Psychiatric/Behavioral: Negative.      Objective:  BP 122/76 (BP Location: Right Arm, Patient Position: Sitting, Cuff Size: Large)   Pulse 74   Temp 97.9 F (36.6 C) (Oral)   Ht '5\' 4"'$  (1.626 m)   Wt 162 lb (73.5 kg)   SpO2 97%   BMI 27.81 kg/m   BP Readings from Last 3 Encounters:  09/16/22 122/76  06/16/22 126/82  03/16/22 122/68    Wt Readings from Last 3 Encounters:  09/16/22 162 lb (73.5 kg)  06/16/22 165 lb 3.2 oz (74.9 kg)  03/16/22 165 lb (74.8 kg)    Physical Exam Vitals reviewed.  HENT:     Mouth/Throat:     Mouth: Mucous membranes are moist.  Eyes:      General: No scleral icterus.    Conjunctiva/sclera: Conjunctivae normal.  Cardiovascular:     Rate and Rhythm: Normal rate and regular rhythm.     Heart sounds: No murmur heard. Pulmonary:     Effort: Pulmonary effort is normal.     Breath sounds: No stridor. No wheezing, rhonchi or rales.  Abdominal:     General: Abdomen is flat.     Palpations: There is no mass.     Tenderness: There is no abdominal tenderness. There is no guarding.     Hernia: No hernia is present.  Musculoskeletal:     Cervical back: Neck supple.     Right lower leg: No edema.     Left lower leg: No edema.  Lymphadenopathy:     Cervical: No cervical adenopathy.  Skin:    General: Skin is warm and dry.  Neurological:     General: No focal deficit present.     Mental Status: He is alert.  Psychiatric:        Mood and Affect: Mood normal.        Behavior: Behavior normal.     Lab Results  Component Value Date   WBC 8.4 09/11/2022   HGB 12.7 (L) 09/11/2022   HCT 35.9 (L) 09/11/2022   PLT 256 09/11/2022   GLUCOSE 132 (H) 09/11/2022   CHOL 119 03/16/2022   TRIG 173.0 (H) 03/16/2022   HDL 47.30 03/16/2022   LDLDIRECT 76.0 11/29/2019   LDLCALC 37 03/16/2022   ALT 40 09/11/2022   AST 31 09/11/2022   NA 137 09/11/2022   K 4.3 09/11/2022   CL 106 09/11/2022   CREATININE 1.20 09/11/2022   BUN 25 (H) 09/11/2022   CO2 25 09/11/2022   TSH 1.90 02/25/2021   PSA 0.41 03/16/2022   INR 1.04 01/23/2017   HGBA1C 6.7 (H) 09/16/2022   MICROALBUR 10.6 (H) 03/16/2022    CARDIAC CATHETERIZATION  Result Date: 01/07/2022   Mid LAD lesion is 90% stenosed with 90% stenosed side branch in 2nd Diag.   Balloon angioplasty was performed on the ostium of the diagonal branch, using a BALLN SAPPHIRE 2.0X12.   A drug-eluting stent was successfully placed covering the LAD lesion and crossing the diagonal sidebranch, using a STENT ONYX FRONTIER 2.75X15. ->  Deployed a 2.8 mm, with proximal optimization to 3.1 mm in the proximal  half of the stent   Post intervention, there is a 0% residual stenosis in the LAD.   Post intervention, the side branch was reduced to 30% residual stenosis.  With mild ulceration noted but no dissection.   ----------------------------   Otherwise normal 1st Diag, remainder of LAD and 2nd Diag along with LCx and RCA.  The left ventricular systolic function is normal.  The left ventricular ejection fraction is 55-65% by visual estimate.   LV end diastolic pressure is normal.   There is no aortic valve stenosis. SUMMARY Severe bifurcation LAD-D2 90% stenosis ([place] 1, 1, 1) as culprit lesion Successful PTCA of ostial D2 followed by DES PCI of LAD (ONYX FRONTIER 2.75 mm x 15 mm-deployed to 2.8 mm with proximal optimization to 3.1 mm) - Crossing the D2, and maintaining TIMI-3 flow reducing to 0% in the LAD and 30 % in the D2 ostium Otherwise angiographically normal coronary arteries Relative normal EF with poor imaging. Normal LVEDP. RECOMMENDATIONS Continue aggressive risk factor modification-consider increasing statin dose Continue beta-blocker and ARB-defer titration to primary cardiologist DAPT as noted [name], MD   Assessment & Plan:   Lyon was seen today for anemia and diabetes.  Diagnoses and all orders for this visit:  Flu vaccine need -     Flu Vaccine QUAD High Dose(Fluad)  Essential hypertension- His blood pressure is adequately well controlled.  Type II diabetes mellitus with manifestations ([place])- His blood sugar is well controlled. -     Hemoglobin A1c; Future -     Hemoglobin A1c -     HM Diabetes Foot Exam  B12 deficiency- B12 and folate are normal. -     Folate; Future -     Vitamin B12; Future -     Vitamin B12 -     Folate  Deficiency anemia- There are no vitamin deficiencies.  This is likely the anemia of chronic disease.  Will follow. -     Vitamin B1; Future -     Zinc; Future -     IBC + Ferritin; Future -     Reticulocytes; Future -     Reticulocytes -      IBC + Ferritin -     Zinc -     Vitamin B1  Other orders -     Pneumococcal polysaccharide vaccine 23-valent greater than or equal to 2yo subcutaneous/IM   I am having Hardin Negus. Shadle maintain his cyanocobalamin, Testosterone, timolol, latanoprost, Krill Oil, Probiotic Product (PROBIOTIC-10 PO), Vitamin D, aspirin EC, methylphenidate, nitroGLYCERIN, Docusate Sodium, ticagrelor, alfuzosin, DULoxetine, esomeprazole, finasteride, lamoTRIgine, metoprolol tartrate, olmesartan, and rosuvastatin.  No orders of the defined types were placed in this encounter.    Follow-up: Return in about 3 months (around 12/17/2022).  Scarlette Calico, MD

## 2022-09-16 NOTE — Patient Instructions (Signed)

## 2022-09-18 ENCOUNTER — Encounter: Payer: Self-pay | Admitting: Hematology and Oncology

## 2022-09-18 ENCOUNTER — Inpatient Hospital Stay: Payer: PPO | Admitting: Hematology and Oncology

## 2022-09-18 DIAGNOSIS — R7989 Other specified abnormal findings of blood chemistry: Secondary | ICD-10-CM

## 2022-09-18 DIAGNOSIS — D472 Monoclonal gammopathy: Secondary | ICD-10-CM

## 2022-09-18 DIAGNOSIS — D539 Nutritional anemia, unspecified: Secondary | ICD-10-CM | POA: Diagnosis not present

## 2022-09-18 LAB — MULTIPLE MYELOMA PANEL, SERUM
Albumin SerPl Elph-Mcnc: 3.7 g/dL (ref 2.9–4.4)
Albumin/Glob SerPl: 1.3 (ref 0.7–1.7)
Alpha 1: 0.2 g/dL (ref 0.0–0.4)
Alpha2 Glob SerPl Elph-Mcnc: 0.8 g/dL (ref 0.4–1.0)
B-Globulin SerPl Elph-Mcnc: 0.9 g/dL (ref 0.7–1.3)
Gamma Glob SerPl Elph-Mcnc: 1.1 g/dL (ref 0.4–1.8)
Globulin, Total: 3 g/dL (ref 2.2–3.9)
IgA: 160 mg/dL (ref 61–437)
IgG (Immunoglobin G), Serum: 1059 mg/dL (ref 603–1613)
IgM (Immunoglobulin M), Srm: 104 mg/dL (ref 15–143)
M Protein SerPl Elph-Mcnc: 0.5 g/dL — ABNORMAL HIGH
Total Protein ELP: 6.7 g/dL (ref 6.0–8.5)

## 2022-09-18 LAB — IBC + FERRITIN
Ferritin: 145.5 ng/mL (ref 22.0–322.0)
Iron: 57 ug/dL (ref 42–165)
Saturation Ratios: 19.6 % — ABNORMAL LOW (ref 20.0–50.0)
TIBC: 291.2 ug/dL (ref 250.0–450.0)
Transferrin: 208 mg/dL — ABNORMAL LOW (ref 212.0–360.0)

## 2022-09-18 LAB — RETICULOCYTES
ABS Retic: 60150 cells/uL (ref 25000–90000)
Retic Ct Pct: 1.5 %

## 2022-09-18 NOTE — Assessment & Plan Note (Signed)
He has borderline elevated serum creatinine and BUN likely due to dehydration This is likely the cause of his mild anemia I recommend increase fluid hydration prior to future appointment

## 2022-09-18 NOTE — Progress Notes (Signed)
HEMATOLOGY-ONCOLOGY ELECTRONIC VISIT PROGRESS NOTE  Patient Care Team: [name], MD as PCP - General (Internal Medicine) Nahser, Wonda Cheng, MD as PCP - Cardiology (Cardiology) Inda Castle, MD (Inactive) (Gastroenterology) Camillo Flaming, [place] (Optometry) Carolan Clines, MD (Inactive) as Consulting Physician (Urology) Rozetta Nunnery, MD (Inactive) (Otolaryngology) Charlton Haws, [facility] as Pharmacist (Pharmacist)  I connected with the patient via telephone conference and verified that I am speaking with the correct person using two identifiers. The patient's location is at home and I am providing care from the [facility] I discussed the limitations, risks, security and privacy concerns of performing an evaluation and management service by e-visits and the availability of in person appointments.  I also discussed with the patient that there may be a patient responsible charge related to this service. The patient expressed understanding and agreed to proceed.   ASSESSMENT & PLAN:  MGUS (monoclonal gammopathy of unknown significance) The patient is noted to have IgG lambda MGUS. According to his recent blood work, and there were nothing to suggest end organ damage. So far, he has no signs of disease progression SPEP is still pending, I will send him a MyChart message with results I spent a lot of time educating the patient the natural history of MGUS I will see him once a year with repeat labs and examination  Elevated serum creatinine He has borderline elevated serum creatinine and BUN likely due to dehydration This is likely the cause of his mild anemia I recommend increase fluid hydration prior to future appointment  No orders of the defined types were placed in this encounter.   INTERVAL HISTORY: Please see below for problem oriented charting. The purpose of today's discussion is to review test results The patient is being followed for MGUS He denies  bone pain or recent infection  SUMMARY OF ONCOLOGIC HISTORY:  The patient was feeling unwell with sensation of dizziness. He was referred to neurologist for further evaluation. Incidentally, during his physical examination, he also noted history of reduced sensation in his feet and numbness at the end of toes.  The patient has history of chronic back pain and was treated with gabapentin for his back pain. His neurologist perform significant workup including blood work which showed IgG lambda MGUS. He is being referred here for further evaluation He denies history of abnormal bone pain or bone fracture. Patient denies history of recurrent infection or atypical infections such as shingles of meningitis. Denies chills, night sweats, anorexia or abnormal weight loss. Repeat blood work and skeletal survey in September 2017 show no evidence of end organ damage  He was recommended observation He was also noted to have borderline B12 deficiency and was recommended oral vitamin B12 replacement therapy   REVIEW OF SYSTEMS:   Constitutional: Denies fevers, chills or abnormal weight loss Eyes: Denies blurriness of vision Ears, nose, mouth, throat, and face: Denies mucositis or sore throat Respiratory: Denies cough, dyspnea or wheezes Cardiovascular: Denies palpitation, chest discomfort Gastrointestinal:  Denies nausea, heartburn or change in bowel habits Skin: Denies abnormal skin rashes Lymphatics: Denies new lymphadenopathy or easy bruising Neurological:Denies numbness, tingling or new weaknesses Behavioral/Psych: Mood is stable, no new changes  Extremities: No lower extremity edema All other systems were reviewed with the patient and are negative.  I have reviewed the past medical history, past surgical history, social history and family history with the patient and they are unchanged from previous note.  ALLERGIES:  has No Known Allergies.  MEDICATIONS:  Current Outpatient Medications   Medication Sig Dispense Refill   alfuzosin (UROXATRAL) 10 MG 24 hr tablet Take 1 tablet (10 mg total) by mouth daily with breakfast. 90 tablet 1   aspirin EC 81 MG tablet Take 1 tablet (81 mg total) by mouth daily. Swallow whole. 90 tablet 3   Cholecalciferol (VITAMIN D) 50 MCG (2000 UT) CAPS Take 1 capsule (2,000 Units total) by mouth daily. 30 capsule 11   Docusate Sodium 100 MG capsule Take 100 mg by mouth 2 (two) times daily.     DULoxetine (CYMBALTA) 30 MG capsule Take 1 capsule (30 mg total) by mouth daily. 90 capsule 1   esomeprazole (NEXIUM) 20 MG capsule Take 1 capsule (20 mg total) by mouth daily at 12 noon. 90 capsule 0   finasteride (PROSCAR) 5 MG tablet Take 1 tablet (5 mg total) by mouth daily. 90 tablet 0   Krill Oil 500 MG CAPS Take 1 capsule by mouth daily.     lamoTRIgine (LAMICTAL) 25 MG tablet Take 1 tablet (25 mg total) by mouth 2 (two) times daily. 180 tablet 0   latanoprost (XALATAN) 0.005 % ophthalmic solution Place 1 drop into both eyes at bedtime.     methylphenidate (METADATE ER) 20 MG ER tablet Take 1 tablet (20 mg total) by mouth daily. 90 tablet 0   metoprolol tartrate (LOPRESSOR) 25 MG tablet Take 1.5 tablets (37.5 mg total) by mouth 2 (two) times daily. 270 tablet 1   nitroGLYCERIN (NITROSTAT) 0.4 MG SL tablet Place 1 tablet (0.4 mg total) under the tongue every 5 (five) minutes as needed for chest pain (Call 911 if 3 doses does not relieve pain.). 25 tablet 3   olmesartan (BENICAR) 40 MG tablet TAKE 1 TABLET(40 MG) BY MOUTH DAILY 90 tablet 1   Probiotic Product (PROBIOTIC-10 PO) Take 1 capsule by mouth daily.     rosuvastatin (CRESTOR) 20 MG tablet TAKE 1 TABLET(20 MG) BY MOUTH DAILY 90 tablet 1   Testosterone 20.25 MG/ACT (1.62%) GEL APPLY 2 ACTUATIONS ONTO THE SKIN DAILY 75 g 1   ticagrelor (BRILINTA) 90 MG TABS tablet Take 1 tablet by mouth 2 times daily. 60 tablet 5   timolol (TIMOPTIC) 0.5 % ophthalmic solution INT 1 GTT IN EACH EYE QAM  0   vitamin B-12  (CYANOCOBALAMIN) 1000 MCG tablet Take 1,000 mcg by mouth daily.     No current facility-administered medications for this visit.    PHYSICAL EXAMINATION: ECOG PERFORMANCE STATUS: 0 - Asymptomatic  LABORATORY DATA:  I have reviewed the data as listed    Latest Ref Rng & Units 09/11/2022    9:45 AM 01/02/2022    1:50 PM 09/05/2021    9:09 AM  CMP  Glucose 70 - 99 mg/dL 132  111  123   BUN 8 - 23 mg/dL '25  14  19   '$ Creatinine 0.61 - 1.24 mg/dL 1.20  1.01  1.14   Sodium 135 - 145 mmol/L 137  136  141   Potassium 3.5 - 5.1 mmol/L 4.3  4.3  4.0   Chloride 98 - 111 mmol/L 106  101  107   CO2 22 - 32 mmol/L '25  25  23   '$ Calcium 8.9 - 10.3 mg/dL 8.7  8.6  9.6   Total Protein 6.5 - 8.1 g/dL 7.4   7.6   Total Bilirubin 0.3 - 1.2 mg/dL 0.5   0.7   Alkaline Phos 38 - 126 U/L 69   77  AST 15 - 41 U/L 31   31   ALT 0 - 44 U/L 40   41     Lab Results  Component Value Date   WBC 8.4 09/11/2022   HGB 12.7 (L) 09/11/2022   HCT 35.9 (L) 09/11/2022   MCV 86.1 09/11/2022   PLT 256 09/11/2022   NEUTROABS 4.4 09/11/2022    I discussed the assessment and treatment plan with the patient. The patient was provided an opportunity to ask questions and all were answered. The patient agreed with the plan and demonstrated an understanding of the instructions. The patient was advised to call back or seek an in-person evaluation if the symptoms worsen or if the condition fails to improve as anticipated.    I spent 20 minutes for the appointment reviewing test results, discuss management and coordination of care.  Heath Lark, MD 09/18/2022 9:51 AM

## 2022-09-18 NOTE — Assessment & Plan Note (Signed)
The patient is noted to have IgG lambda MGUS. According to his recent blood work, and there were nothing to suggest end organ damage. So far, he has no signs of disease progression SPEP is still pending, I will send him a MyChart message with results I spent a lot of time educating the patient the natural history of MGUS I will see him once a year with repeat labs and examination

## 2022-09-19 ENCOUNTER — Encounter: Payer: Self-pay | Admitting: Internal Medicine

## 2022-09-20 LAB — ZINC: Zinc: 163 ug/dL — ABNORMAL HIGH (ref 60–130)

## 2022-09-22 ENCOUNTER — Encounter: Payer: Self-pay | Admitting: Internal Medicine

## 2022-09-23 LAB — VITAMIN B1: Vitamin B1 (Thiamine): 79 nmol/L — ABNORMAL HIGH (ref 8–30)

## 2022-10-05 ENCOUNTER — Encounter: Payer: Self-pay | Admitting: Internal Medicine

## 2022-10-05 ENCOUNTER — Other Ambulatory Visit: Payer: Self-pay | Admitting: Internal Medicine

## 2022-10-06 ENCOUNTER — Other Ambulatory Visit: Payer: Self-pay | Admitting: Internal Medicine

## 2022-10-06 ENCOUNTER — Other Ambulatory Visit (HOSPITAL_COMMUNITY): Payer: Self-pay

## 2022-10-06 DIAGNOSIS — R053 Chronic cough: Secondary | ICD-10-CM | POA: Insufficient documentation

## 2022-10-07 ENCOUNTER — Encounter: Payer: Self-pay | Admitting: Internal Medicine

## 2022-10-08 ENCOUNTER — Other Ambulatory Visit: Payer: Self-pay | Admitting: Internal Medicine

## 2022-10-08 DIAGNOSIS — F900 Attention-deficit hyperactivity disorder, predominantly inattentive type: Secondary | ICD-10-CM

## 2022-10-08 MED ORDER — METHYLPHENIDATE HCL ER 20 MG PO TBCR: 20.0000 mg | EXTENDED_RELEASE_TABLET | Freq: Every day | ORAL | 0 refills | Status: DC

## 2022-10-09 ENCOUNTER — Encounter: Payer: Self-pay | Admitting: Internal Medicine

## 2022-10-09 ENCOUNTER — Ambulatory Visit (INDEPENDENT_AMBULATORY_CARE_PROVIDER_SITE_OTHER): Payer: PPO

## 2022-10-09 ENCOUNTER — Ambulatory Visit: Payer: PPO | Admitting: Internal Medicine

## 2022-10-09 VITALS — BP 120/68 | HR 72 | Temp 97.8°F | Ht 64.0 in | Wt 166.4 lb

## 2022-10-09 DIAGNOSIS — R053 Chronic cough: Secondary | ICD-10-CM

## 2022-10-09 DIAGNOSIS — R059 Cough, unspecified: Secondary | ICD-10-CM | POA: Diagnosis not present

## 2022-10-09 MED ORDER — FAMOTIDINE 20 MG PO TABS: ORAL_TABLET | ORAL | 11 refills | Status: DC

## 2022-10-09 MED ORDER — PANTOPRAZOLE SODIUM 40 MG PO TBEC: 40.0000 mg | DELAYED_RELEASE_TABLET | Freq: Every day | ORAL | 2 refills | Status: DC

## 2022-10-09 NOTE — Patient Instructions (Addendum)
Please remember to go to the  x-ray department  for your tests - we will call you with the results when they are available     For cough > Mucinex DM 1200 mg every 12 hours as needed    And  >>>  change nexium to Pantoprazole (protonix) 40 mg   Take  30-60 min before first meal of the day and Pepcid (famotidine)  20 mg after supper until cough is gone for a full week without the need for cough medicine   GERD (REFLUX)  is an extremely common cause of respiratory symptoms just like yours , many times with no obvious heartburn at all.    It can be treated with medication, but also with lifestyle changes including elevation of the head of your bed (ideally with 6 -8inch blocks under the headboard of your bed),  Smoking cessation, avoidance of late meals, excessive alcohol, and avoid fatty foods, chocolate, peppermint, colas, red wine, and acidic juices such as orange juice.  NO MINT OR MENTHOL PRODUCTS SO NO COUGH DROPS  USE SUGARLESS CANDY INSTEAD (Jolley ranchers or Stover's or Life Savers) or even ice chips will also do - the key is to swallow to prevent all throat clearing. NO OIL BASED VITAMINS - use powdered substitutes.  Avoid fish oil when coughing.    If not 100% better on this plan for 6 weeks, please call for follow up

## 2022-10-09 NOTE — Progress Notes (Signed)
Robert Mccall, male    DOB: [birth year]   MRN: [id number]   Brief patient profile:  24 yowm  never smoker with MGUS/ IHD  referred to pulmonary clinic 10/09/2022 by Robert Mccall  for variably purulent intermittent cough x Jan 2023  in setting of bad gerd x  1990s with erosive esophagitis by  EGD 1995     History of Present Illness  10/09/2022  Pulmonary/ 1st office eval/Robert Mccall cough intermittent ? better abx x 2 episodes 3rd started Aug 2023 Chief Complaint  Patient presents with   Consult    Pt states he has had a repeated  productive cough for most of the year. Pt states he keeps taking meds and it gets better and then comes back.  Dyspnea:  walking slow but does steps ok, more fatigue than sob  Cough: min sporadic up to twice daily  Sleep: no longer waking him up / bed is flat  SABA use: none   No obvious day to day or daytime pattern/variability or assoc ongoing excess/ purulent sputum or mucus plugs or hemoptysis or cp or chest tightness, subjective wheeze or overt sinus or hb symptoms.   Sleeping  without nocturnal  or early am exacerbation  of respiratory  c/o's or need for noct saba. Also denies any obvious fluctuation of symptoms with weather or environmental changes or other aggravating or alleviating factors except as outlined above   No unusual exposure hx or h/o childhood pna/ asthma or knowledge of premature birth.  Current Allergies, Complete Past Medical History, Past Surgical History, Family History, and Social History were reviewed in [employer] record.  ROS  The following are not active complaints unless bolded Hoarseness, sore throat, dysphagia, dental problems, itching, sneezing,  nasal congestion or discharge of excess mucus or purulent secretions, ear ache,   fever, chills, sweats, unintended wt loss or wt gain, classically pleuritic or exertional cp,  orthopnea pnd or arm/hand swelling  or leg swelling, presyncope, palpitations,  abdominal pain, anorexia, nausea, vomiting, diarrhea  or change in bowel habits or change in bladder habits, change in stools or change in urine, dysuria, hematuria,  rash, arthralgias, visual complaints, headache, numbness, weakness or ataxia or problems with walking or coordination,  change in mood or  memory.           Past Medical History:  Diagnosis Date   ADHD (attention deficit hyperactivity disorder)    Anxiety    Benign prostatic hypertrophy    Chronic lower back pain    surgery schedule for back 10/21/16   Cyst of right kidney    incidental [place] 1.3cm on R (MRI abd 09/2014), follows with uro   Depression    GERD (gastroesophageal reflux disease)    Glaucoma    Hard of hearing    History of migraine headaches    no meds   Hyperlipidemia    Hypertension    Menetrier's disease     Outpatient Medications Prior to Visit  Medication Sig Dispense Refill   alfuzosin (UROXATRAL) 10 MG 24 hr tablet Take 1 tablet (10 mg total) by mouth daily with breakfast. 90 tablet 1   aspirin EC 81 MG tablet Take 1 tablet (81 mg total) by mouth daily. Swallow whole. 90 tablet 3   Cholecalciferol (VITAMIN D) 50 MCG (2000 UT) CAPS Take 1 capsule (2,000 Units total) by mouth daily. 30 capsule 11   Docusate Sodium 100 MG capsule Take 100 mg by mouth 2 (two) times  daily.     DULoxetine (CYMBALTA) 30 MG capsule Take 1 capsule (30 mg total) by mouth daily. 90 capsule 1   esomeprazole (NEXIUM) 20 MG capsule Take 1 capsule (20 mg total) by mouth daily at 12 noon. 90 capsule 0   finasteride (PROSCAR) 5 MG tablet Take 1 tablet (5 mg total) by mouth daily. 90 tablet 0   Krill Oil 500 MG CAPS Take 1 capsule by mouth daily.     lamoTRIgine (LAMICTAL) 25 MG tablet Take 1 tablet (25 mg total) by mouth 2 (two) times daily. 180 tablet 0   latanoprost (XALATAN) 0.005 % ophthalmic solution Place 1 drop into both eyes at bedtime.     methylphenidate (METADATE ER) 20 MG ER tablet Take 1 tablet (20 mg total) by  mouth daily. 90 tablet 0   metoprolol tartrate (LOPRESSOR) 25 MG tablet Take 1.5 tablets (37.5 mg total) by mouth 2 (two) times daily. 270 tablet 1   nitroGLYCERIN (NITROSTAT) 0.4 MG SL tablet Place 1 tablet (0.4 mg total) under the tongue every 5 (five) minutes as needed for chest pain (Call 911 if 3 doses does not relieve pain.). 25 tablet 3   olmesartan (BENICAR) 40 MG tablet TAKE 1 TABLET(40 MG) BY MOUTH DAILY 90 tablet 1   Probiotic Product (PROBIOTIC-10 PO) Take 1 capsule by mouth daily.     rosuvastatin (CRESTOR) 20 MG tablet TAKE 1 TABLET(20 MG) BY MOUTH DAILY 90 tablet 1   Testosterone 20.25 MG/ACT (1.62%) GEL APPLY 2 ACTUATIONS ONTO THE SKIN DAILY 75 g 1   ticagrelor (BRILINTA) 90 MG TABS tablet Take 1 tablet by mouth 2 times daily. 60 tablet 5   timolol (TIMOPTIC) 0.5 % ophthalmic solution INT 1 GTT IN EACH EYE QAM  0   vitamin B-12 (CYANOCOBALAMIN) 1000 MCG tablet Take 1,000 mcg by mouth daily.     Zinc 50 MG TABS Take by mouth.     No facility-administered medications prior to visit.     Objective:     BP 120/68 (BP Location: Left Arm, Patient Position: Sitting, Cuff Size: Normal)   Pulse 72   Temp 97.8 F (36.6 C) (Oral)   Ht '5\' 4"'$  (1.626 m)   Wt 166 lb 6.4 oz (75.5 kg)   SpO2 98% Comment: on RA  BMI 28.56 kg/m   SpO2: 98 % (on RA)  Pleasant amb elderly wm nad    HEENT : Oropharynx  clear      Nasal turbinates mild TE, no purulent secrtions    NECK :  without  apparent JVD/ palpable Nodes/TM    LUNGS: no acc muscle use,  Nl contour chest which is clear to A and P bilaterally without cough on insp or exp maneuvers   CV:  RRR  no s3 or murmur or increase in P2, and no edema   ABD:  soft and nontender with nl inspiratory excursion in the supine position. No bruits or organomegaly appreciated   MS:  Nl gait/ ext warm without deformities Or obvious joint restrictions  calf tenderness, cyanosis or clubbing    SKIN: warm and dry without lesions    NEURO:   alert, approp, nl sensorium with  no motor or cerebellar deficits apparent.     CT chest 04/29/20 (part of CT coronary study) :  wnl / no bronchiectasis   CXR PA and Lateral:   10/09/2022 :    I personally reviewed images and impression is as follows:     No acute changes  Assessment   Chronic cough Onset Jan 2023 in setting of MUGA and severe GERD with esophagitis by egd  1995   Above assoc worrisome for bronchiectasis evolving but no specific rx to offer even if this is the case other than aggressive rx for GERD when starts coughing (nexium 20 mg daily may not be enough)  Rec Mucinex dm up to 1200 mg every 12 h prn Max ppi and rx for non acid gerd eg diet/ bed blocks F/u in 6 weeks then HRCT chest and sinus CT to be complete if not responding to above rx           Each maintenance medication was reviewed in detail including emphasizing most importantly the difference between maintenance and prns and under what circumstances the prns are to be triggered using an action plan format where appropriate.  Total time for H and P, chart review, counseling,  and generating customized AVS unique to this office visit / same day charting = 40 min new pt eval.          Christinia Gully, MD 10/09/2022

## 2022-10-09 NOTE — Assessment & Plan Note (Addendum)
Onset Jan 2023 in setting of MUGA and severe GERD with esophagitis by egd  1995   Above assoc worrisome for bronchiectasis evolving but no specific rx to offer even if this is the case other than aggressive rx for GERD when starts coughing (nexium 20 mg daily may not be enough)  Rec Mucinex dm up to 1200 mg every 12 h prn Max ppi and rx for non acid gerd eg diet/ bed blocks F/u in 6 weeks then HRCT chest and sinus CT to be complete if not responding to above rx           Each maintenance medication was reviewed in detail including emphasizing most importantly the difference between maintenance and prns and under what circumstances the prns are to be triggered using an action plan format where appropriate.  Total time for H and P, chart review, counseling,  and generating customized AVS unique to this office visit / same day charting = 40 min new pt eval.

## 2022-11-03 ENCOUNTER — Encounter: Payer: Self-pay | Admitting: Internal Medicine

## 2022-11-03 ENCOUNTER — Other Ambulatory Visit (HOSPITAL_COMMUNITY): Payer: Self-pay

## 2022-11-16 ENCOUNTER — Ambulatory Visit: Payer: PPO | Admitting: Internal Medicine

## 2022-12-01 ENCOUNTER — Other Ambulatory Visit (HOSPITAL_COMMUNITY): Payer: Self-pay

## 2022-12-01 ENCOUNTER — Other Ambulatory Visit: Payer: Self-pay

## 2022-12-02 ENCOUNTER — Other Ambulatory Visit: Payer: Self-pay

## 2022-12-09 ENCOUNTER — Encounter: Payer: Self-pay | Admitting: Cardiovascular Disease

## 2022-12-10 NOTE — Telephone Encounter (Signed)
ahser, Wonda Cheng, MD  Cv Div [facility] [phone] hours ago (8:22 PM)    At the completion of 1 year ( Feb. 2024)  , we can swtich to plavix 75 mg a day He should have an appt to see me in Feb. 2024

## 2022-12-16 ENCOUNTER — Ambulatory Visit: Payer: PPO | Admitting: Cardiovascular Disease

## 2022-12-17 ENCOUNTER — Encounter: Payer: Self-pay | Admitting: Internal Medicine

## 2022-12-17 ENCOUNTER — Ambulatory Visit (INDEPENDENT_AMBULATORY_CARE_PROVIDER_SITE_OTHER): Payer: PPO

## 2022-12-17 ENCOUNTER — Ambulatory Visit (INDEPENDENT_AMBULATORY_CARE_PROVIDER_SITE_OTHER): Payer: PPO | Admitting: Internal Medicine

## 2022-12-17 VITALS — BP 122/76 | HR 64 | Temp 98.1°F | Ht 64.0 in | Wt 169.0 lb

## 2022-12-17 DIAGNOSIS — R109 Unspecified abdominal pain: Secondary | ICD-10-CM | POA: Diagnosis not present

## 2022-12-17 DIAGNOSIS — K7581 Nonalcoholic steatohepatitis (NASH): Secondary | ICD-10-CM | POA: Diagnosis not present

## 2022-12-17 DIAGNOSIS — N401 Enlarged prostate with lower urinary tract symptoms: Secondary | ICD-10-CM

## 2022-12-17 DIAGNOSIS — R748 Abnormal levels of other serum enzymes: Secondary | ICD-10-CM | POA: Diagnosis not present

## 2022-12-17 DIAGNOSIS — R10811 Right upper quadrant abdominal tenderness: Secondary | ICD-10-CM | POA: Insufficient documentation

## 2022-12-17 DIAGNOSIS — E538 Deficiency of other specified B group vitamins: Secondary | ICD-10-CM | POA: Diagnosis not present

## 2022-12-17 DIAGNOSIS — E785 Hyperlipidemia, unspecified: Secondary | ICD-10-CM

## 2022-12-17 DIAGNOSIS — I10 Essential (primary) hypertension: Secondary | ICD-10-CM | POA: Diagnosis not present

## 2022-12-17 DIAGNOSIS — E118 Type 2 diabetes mellitus with unspecified complications: Secondary | ICD-10-CM

## 2022-12-17 DIAGNOSIS — R351 Nocturia: Secondary | ICD-10-CM | POA: Diagnosis not present

## 2022-12-17 DIAGNOSIS — E781 Pure hyperglyceridemia: Secondary | ICD-10-CM

## 2022-12-17 LAB — CBC WITH DIFFERENTIAL/PLATELET
Basophils Absolute: 0 10*3/uL (ref 0.0–0.1)
Basophils Relative: 0.5 % (ref 0.0–3.0)
Eosinophils Absolute: 0.2 10*3/uL (ref 0.0–0.7)
Eosinophils Relative: 2.9 % (ref 0.0–5.0)
HCT: 43.7 % (ref 39.0–52.0)
Hemoglobin: 14.9 g/dL (ref 13.0–17.0)
Lymphocytes Relative: 28.1 % (ref 12.0–46.0)
Lymphs Abs: 2.4 10*3/uL (ref 0.7–4.0)
MCHC: 34.2 g/dL (ref 30.0–36.0)
MCV: 90.2 fl (ref 78.0–100.0)
Monocytes Absolute: 0.9 10*3/uL (ref 0.1–1.0)
Monocytes Relative: 10.8 % (ref 3.0–12.0)
Neutro Abs: 5 10*3/uL (ref 1.4–7.7)
Neutrophils Relative %: 57.7 % (ref 43.0–77.0)
Platelets: 240 10*3/uL (ref 150.0–400.0)
RBC: 4.85 Mil/uL (ref 4.22–5.81)
RDW: 12.3 % (ref 11.5–15.5)
WBC: 8.6 10*3/uL (ref 4.0–10.5)

## 2022-12-17 LAB — LIPID PANEL
Cholesterol: 122 mg/dL (ref 0–200)
HDL: 38.4 mg/dL — ABNORMAL LOW (ref >39.00)
LDL Cholesterol: 45 mg/dL (ref 0–99)
NonHDL: 83.35
Total CHOL/HDL Ratio: 3
Triglycerides: 191 mg/dL — ABNORMAL HIGH (ref 0.0–149.0)
VLDL: 38.2 mg/dL (ref 0.0–40.0)

## 2022-12-17 LAB — URINALYSIS, ROUTINE W REFLEX MICROSCOPIC
Bilirubin Urine: NEGATIVE
Hgb urine dipstick: NEGATIVE
Ketones, ur: NEGATIVE
Leukocytes,Ua: NEGATIVE
Nitrite: NEGATIVE
Specific Gravity, Urine: 1.025 (ref 1.000–1.030)
Urine Glucose: NEGATIVE
Urobilinogen, UA: 0.2 (ref 0.0–1.0)
pH: 6 (ref 5.0–8.0)

## 2022-12-17 LAB — BASIC METABOLIC PANEL
BUN: 17 mg/dL (ref 6–23)
CO2: 28 mEq/L (ref 19–32)
Calcium: 9.4 mg/dL (ref 8.4–10.5)
Chloride: 102 mEq/L (ref 96–112)
Creatinine, Ser: 1.24 mg/dL (ref 0.40–1.50)
GFR: 58.4 mL/min — ABNORMAL LOW (ref >60.00)
Glucose, Bld: 116 mg/dL — ABNORMAL HIGH (ref 70–99)
Potassium: 4.1 mEq/L (ref 3.5–5.1)
Sodium: 138 mEq/L (ref 135–145)

## 2022-12-17 LAB — LIPASE: Lipase: 97 U/L — ABNORMAL HIGH (ref 11.0–59.0)

## 2022-12-17 LAB — MICROALBUMIN / CREATININE URINE RATIO
Creatinine,U: 249.1 mg/dL
Microalb Creat Ratio: 1.8 mg/g (ref 0.0–30.0)
Microalb, Ur: 4.4 mg/dL — ABNORMAL HIGH (ref 0.0–1.9)

## 2022-12-17 LAB — TSH: TSH: 2.37 u[IU]/mL (ref 0.35–5.50)

## 2022-12-17 NOTE — Progress Notes (Signed)
Subjective:  Patient ID: Robert Mccall, male    DOB: [birth year]  Age: 72 y.o. MRN: [id number]  CC: Abdominal Pain, Hypertension, and Anemia   HPI Robert Mccall presents for f/up - He complains of a 5-week history of pain in his right upper quadrant and right lower quadrant.  He complains of fatigue but denies nausea, vomiting, loss of appetite, or weight loss.  Outpatient Medications Prior to Visit  Medication Sig Dispense Refill   alfuzosin (UROXATRAL) 10 MG 24 hr tablet Take 1 tablet (10 mg total) by mouth daily with breakfast. 90 tablet 1   aspirin EC 81 MG tablet Take 1 tablet (81 mg total) by mouth daily. Swallow whole. 90 tablet 3   Cholecalciferol (VITAMIN D) 50 MCG (2000 UT) CAPS Take 1 capsule (2,000 Units total) by mouth daily. 30 capsule 11   Docusate Sodium 100 MG capsule Take 100 mg by mouth 2 (two) times daily.     famotidine (PEPCID) 20 MG tablet One after supper 30 tablet 11   finasteride (PROSCAR) 5 MG tablet Take 1 tablet (5 mg total) by mouth daily. 90 tablet 0   lamoTRIgine (LAMICTAL) 25 MG tablet Take 1 tablet (25 mg total) by mouth 2 (two) times daily. 180 tablet 0   latanoprost (XALATAN) 0.005 % ophthalmic solution Place 1 drop into both eyes at bedtime.     methylphenidate (METADATE ER) 20 MG ER tablet Take 1 tablet (20 mg total) by mouth daily. 90 tablet 0   metoprolol tartrate (LOPRESSOR) 25 MG tablet Take 1.5 tablets (37.5 mg total) by mouth 2 (two) times daily. 270 tablet 1   nitroGLYCERIN (NITROSTAT) 0.4 MG SL tablet Place 1 tablet (0.4 mg total) under the tongue every 5 (five) minutes as needed for chest pain (Call 911 if 3 doses does not relieve pain.). 25 tablet 3   olmesartan (BENICAR) 40 MG tablet TAKE 1 TABLET(40 MG) BY MOUTH DAILY 90 tablet 1   pantoprazole (PROTONIX) 40 MG tablet Take 1 tablet (40 mg total) by mouth daily. Take 30-60 min before first meal of the day 30 tablet 2   Probiotic Product (PROBIOTIC-10 PO) Take 1 capsule by mouth  daily.     rosuvastatin (CRESTOR) 20 MG tablet TAKE 1 TABLET(20 MG) BY MOUTH DAILY 90 tablet 1   Testosterone 20.25 MG/ACT (1.62%) GEL APPLY 2 ACTUATIONS ONTO THE SKIN DAILY 75 g 1   ticagrelor (BRILINTA) 90 MG TABS tablet Take 1 tablet by mouth 2 times daily. 60 tablet 5   timolol (TIMOPTIC) 0.5 % ophthalmic solution INT 1 GTT IN EACH EYE QAM  0   vitamin B-12 (CYANOCOBALAMIN) 1000 MCG tablet Take 1,000 mcg by mouth daily.     Zinc 50 MG TABS Take by mouth.     DULoxetine (CYMBALTA) 30 MG capsule Take 1 capsule (30 mg total) by mouth daily. 90 capsule 1   esomeprazole (NEXIUM) 20 MG capsule Take 1 capsule (20 mg total) by mouth daily at 12 noon. 90 capsule 0   Krill Oil 500 MG CAPS Take 1 capsule by mouth daily.     No facility-administered medications prior to visit.    ROS Review of Systems  Constitutional:  Positive for fatigue. Negative for appetite change, chills, diaphoresis, fever and unexpected weight change.  HENT: Negative.    Eyes: Negative.   Respiratory:  Positive for cough. Negative for chest tightness, shortness of breath and wheezing.   Cardiovascular:  Negative for chest pain, palpitations and leg swelling.  Gastrointestinal:  Positive for abdominal pain. Negative for anal bleeding, blood in stool, constipation, diarrhea, nausea and vomiting.       "Aching"  Endocrine: Negative.   Genitourinary: Negative.  Negative for difficulty urinating, dysuria, flank pain, hematuria, scrotal swelling and testicular pain.  Musculoskeletal: Negative.   Skin: Negative.  Negative for color change.  Neurological: Negative.  Negative for dizziness and weakness.  Hematological:  Negative for adenopathy. Does not bruise/bleed easily.  Psychiatric/Behavioral: Negative.      Objective:  BP 122/76 (BP Location: Left Arm, Patient Position: Sitting, Cuff Size: Large)   Pulse 64   Temp 98.1 F (36.7 C) (Oral)   Ht '5\' 4"'$  (1.626 m)   Wt 169 lb (76.7 kg)   SpO2 95%   BMI 29.01 kg/m    BP Readings from Last 3 Encounters:  12/17/22 122/76  10/09/22 120/68  09/16/22 122/76    Wt Readings from Last 3 Encounters:  12/17/22 169 lb (76.7 kg)  10/09/22 166 lb 6.4 oz (75.5 kg)  09/16/22 162 lb (73.5 kg)    Physical Exam Vitals reviewed.  HENT:     Mouth/Throat:     Mouth: Mucous membranes are moist.  Eyes:     General: No scleral icterus.    Conjunctiva/sclera: Conjunctivae normal.  Cardiovascular:     Rate and Rhythm: Normal rate and regular rhythm.     Pulses: Normal pulses.     Heart sounds: No murmur heard.    No friction rub. No gallop.  Pulmonary:     Effort: Pulmonary effort is normal.     Breath sounds: No stridor. No wheezing, rhonchi or rales.  Abdominal:     General: Abdomen is flat. There is no distension.     Palpations: Abdomen is soft. There is no hepatomegaly, splenomegaly or mass.     Tenderness: There is abdominal tenderness in the right upper quadrant and right lower quadrant. There is no guarding or rebound.     Hernia: No hernia is present. There is no hernia in the left inguinal area or right inguinal area.  Genitourinary:    Pubic Area: No rash.      Penis: Normal and circumcised.      Testes: Normal.     Epididymis:     Right: Normal.     Left: Normal.     Prostate: Not enlarged, not tender and no nodules present.     Rectum: Normal. Guaiac result negative. No mass, tenderness, anal fissure, external hemorrhoid or internal hemorrhoid. Normal anal tone.  Musculoskeletal:        General: Normal range of motion.     Cervical back: Neck supple.     Right lower leg: No edema.     Left lower leg: No edema.  Lymphadenopathy:     Cervical: No cervical adenopathy.     Lower Body: No right inguinal adenopathy. No left inguinal adenopathy.  Skin:    General: Skin is warm and dry.  Neurological:     General: No focal deficit present.     Mental Status: He is alert.  Psychiatric:        Mood and Affect: Mood normal.        Behavior:  Behavior normal.     Lab Results  Component Value Date   WBC 8.6 12/17/2022   HGB 14.9 12/17/2022   HCT 43.7 12/17/2022   PLT 240.0 12/17/2022   GLUCOSE 116 (H) 12/17/2022   CHOL 122 12/17/2022   TRIG 191.0 (H)  12/17/2022   HDL 38.40 (L) 12/17/2022   LDLDIRECT 76.0 11/29/2019   LDLCALC 45 12/17/2022   ALT 40 09/11/2022   AST 31 09/11/2022   NA 138 12/17/2022   K 4.1 12/17/2022   CL 102 12/17/2022   CREATININE 1.24 12/17/2022   BUN 17 12/17/2022   CO2 28 12/17/2022   TSH 2.37 12/17/2022   PSA 0.41 03/16/2022   INR 1.04 01/23/2017   HGBA1C 6.7 (H) 09/16/2022   MICROALBUR 4.4 (H) 12/17/2022    CARDIAC CATHETERIZATION  Result Date: 01/07/2022   Mid LAD lesion is 90% stenosed with 90% stenosed side branch in 2nd Diag.   Balloon angioplasty was performed on the ostium of the diagonal branch, using a BALLN SAPPHIRE 2.0X12.   A drug-eluting stent was successfully placed covering the LAD lesion and crossing the diagonal sidebranch, using a STENT ONYX FRONTIER 2.75X15. ->  Deployed a 2.8 mm, with proximal optimization to 3.1 mm in the proximal half of the stent   Post intervention, there is a 0% residual stenosis in the LAD.   Post intervention, the side branch was reduced to 30% residual stenosis.  With mild ulceration noted but no dissection.   ----------------------------   Otherwise normal 1st Diag, remainder of LAD and 2nd Diag along with LCx and RCA.   The left ventricular systolic function is normal.  The left ventricular ejection fraction is 55-65% by visual estimate.   LV end diastolic pressure is normal.   There is no aortic valve stenosis. SUMMARY Severe bifurcation LAD-D2 90% stenosis ([place] 1, 1, 1) as culprit lesion Successful PTCA of ostial D2 followed by DES PCI of LAD (ONYX FRONTIER 2.75 mm x 15 mm-deployed to 2.8 mm with proximal optimization to 3.1 mm) - Crossing the D2, and maintaining TIMI-3 flow reducing to 0% in the LAD and 30 % in the D2 ostium Otherwise  angiographically normal coronary arteries Relative normal EF with poor imaging. Normal LVEDP. RECOMMENDATIONS Continue aggressive risk factor modification-consider increasing statin dose Continue beta-blocker and ARB-defer titration to primary cardiologist DAPT as noted [name], MD  CT Abdomen Pelvis W Contrast  Result Date: 12/18/2022 CLINICAL DATA:  Right lower quadrant abdominal pain 4 3 months. Elevated lipase EXAM: CT ABDOMEN AND PELVIS WITH CONTRAST TECHNIQUE: Multidetector CT imaging of the abdomen and pelvis was performed using the standard protocol following bolus administration of intravenous contrast. RADIATION DOSE REDUCTION: This exam was performed according to the departmental dose-optimization program which includes automated exposure control, adjustment of the mA and/or kV according to patient size and/or use of iterative reconstruction technique. CONTRAST:  171m ISOVUE-300 IOPAMIDOL (ISOVUE-300) INJECTION 61% COMPARISON:  Acute abdominal series 12/17/2022. CT of the abdomen and pelvis without and with contrast 04/02/2014 FINDINGS: Lower chest: 3 mm nodule in the right lower lobe on image 8 of series 4 is stable overall most 9 years. Recommend no follow-up imaging. Lung bases are otherwise clear. The heart size is normal. No significant pleural or pericardial effusion is present. Hepatobiliary: Diffuse fatty infiltration liver is present. A stable 12 mm cyst is present in the left lobe. No other focal lesions are present. The common bile duct and gallbladder are within normal limits. Pancreas: Unremarkable. No pancreatic ductal dilatation or surrounding inflammatory changes. Spleen: Normal in size without focal abnormality. Adrenals/Urinary Tract: Adrenal glands are normal bilaterally. An 18 mm simple cyst projecting posteriorly from the right kidney demonstrates slight interval growth. Recommend no follow-up imaging. Scattered parenchymal thinning likely represents scarring from remote  infection, stable.  No stone or obstruction is present. Ureters are within normal limits. The urinary bladder is mostly collapsed. Stomach/Bowel: The stomach and duodenum are within normal limits. Small bowel is unremarkable. Terminal ileum is within normal limits. The appendix is visualized and normal. The ascending and transverse colon are within normal limits. Diverticular changes are present within the descending and sigmoid colon. No significant focal inflammatory changes are present to suggest acute diverticulitis. Some wall thickening in the sigmoid colon is mostly due to lack of distension without associated inflammatory change. Vascular/Lymphatic: Atherosclerotic calcifications are present in the aorta and branch vessels without aneurysm. No significant retroperitoneal adenopathy is present. Reproductive: Prostate gland and seminal vesicles are within normal limits. Other: No abdominal wall hernia or abnormality. No abdominopelvic ascites. Musculoskeletal: Degenerative changes are present lower lumbar spine with leftward curvature centered at L3-4. Asymmetric right-sided disease is present at L2-3, L3-4 and L4-5. Asymmetric left-sided disease is present at L5-S1. Bone island in the left S2 segment is stable over time. No other focal osseous lesions are present. The hips are located and within normal limits bilaterally. IMPRESSION: 1. No acute or focal lesion to explain the patient's symptoms. 2. Normal pancreas. 3. Normal appendix. 4. Descending and sigmoid diverticulosis without diverticulitis. 5. Hepatic steatosis. 6. Multilevel degenerative changes in the lower lumbar spine. 7.  Aortic Atherosclerosis (ICD10-I70.0). Electronically Signed   By: San Morelle M.D.   On: 12/18/2022 13:26   DG ABD ACUTE 2+V W 1V CHEST  Result Date: 12/17/2022 CLINICAL DATA:  Abdominal pain EXAM: DG ABDOMEN ACUTE WITH 1 VIEW CHEST COMPARISON:  None Available. FINDINGS: There is no evidence of dilated bowel loops or  free intraperitoneal air. No radiopaque calculi or other significant radiographic abnormality is seen. Heart size and mediastinal contours are within normal limits. Both lungs are clear. Multilevel degenerative disc disease. Mild transitional anatomy with partial sacralization of L5 on the right. Mild levoconvex lumbar scoliosis. IMPRESSION: Negative abdominal radiographs.  No acute cardiopulmonary disease. Transitional anatomy with partial sacralization of L5 on the right. Mild levoconvex scoliosis and multilevel degenerative disc disease. Electronically Signed   By: Jacqulynn Cadet M.D.   On: 12/17/2022 12:10      CT Abdomen Pelvis W Contrast  Result Date: 12/18/2022 CLINICAL DATA:  Right lower quadrant abdominal pain 4 3 months. Elevated lipase EXAM: CT ABDOMEN AND PELVIS WITH CONTRAST TECHNIQUE: Multidetector CT imaging of the abdomen and pelvis was performed using the standard protocol following bolus administration of intravenous contrast. RADIATION DOSE REDUCTION: This exam was performed according to the departmental dose-optimization program which includes automated exposure control, adjustment of the mA and/or kV according to patient size and/or use of iterative reconstruction technique. CONTRAST:  168m ISOVUE-300 IOPAMIDOL (ISOVUE-300) INJECTION 61% COMPARISON:  Acute abdominal series 12/17/2022. CT of the abdomen and pelvis without and with contrast 04/02/2014 FINDINGS: Lower chest: 3 mm nodule in the right lower lobe on image 8 of series 4 is stable overall most 9 years. Recommend no follow-up imaging. Lung bases are otherwise clear. The heart size is normal. No significant pleural or pericardial effusion is present. Hepatobiliary: Diffuse fatty infiltration liver is present. A stable 12 mm cyst is present in the left lobe. No other focal lesions are present. The common bile duct and gallbladder are within normal limits. Pancreas: Unremarkable. No pancreatic ductal dilatation or surrounding  inflammatory changes. Spleen: Normal in size without focal abnormality. Adrenals/Urinary Tract: Adrenal glands are normal bilaterally. An 18 mm simple cyst projecting posteriorly from the right kidney demonstrates  slight interval growth. Recommend no follow-up imaging. Scattered parenchymal thinning likely represents scarring from remote infection, stable. No stone or obstruction is present. Ureters are within normal limits. The urinary bladder is mostly collapsed. Stomach/Bowel: The stomach and duodenum are within normal limits. Small bowel is unremarkable. Terminal ileum is within normal limits. The appendix is visualized and normal. The ascending and transverse colon are within normal limits. Diverticular changes are present within the descending and sigmoid colon. No significant focal inflammatory changes are present to suggest acute diverticulitis. Some wall thickening in the sigmoid colon is mostly due to lack of distension without associated inflammatory change. Vascular/Lymphatic: Atherosclerotic calcifications are present in the aorta and branch vessels without aneurysm. No significant retroperitoneal adenopathy is present. Reproductive: Prostate gland and seminal vesicles are within normal limits. Other: No abdominal wall hernia or abnormality. No abdominopelvic ascites. Musculoskeletal: Degenerative changes are present lower lumbar spine with leftward curvature centered at L3-4. Asymmetric right-sided disease is present at L2-3, L3-4 and L4-5. Asymmetric left-sided disease is present at L5-S1. Bone island in the left S2 segment is stable over time. No other focal osseous lesions are present. The hips are located and within normal limits bilaterally. IMPRESSION: 1. No acute or focal lesion to explain the patient's symptoms. 2. Normal pancreas. 3. Normal appendix. 4. Descending and sigmoid diverticulosis without diverticulitis. 5. Hepatic steatosis. 6. Multilevel degenerative changes in the lower lumbar  spine. 7.  Aortic Atherosclerosis (ICD10-I70.0). Electronically Signed   By: San Morelle M.D.   On: 12/18/2022 13:26     Assessment & Plan:   Jaques was seen today for abdominal pain, hypertension and anemia.  Diagnoses and all orders for this visit:  Essential hypertension- BP is well controlled. -     Basic metabolic panel; Future -     Urinalysis, Routine w reflex microscopic; Future -     TSH; Future -     TSH -     Urinalysis, Routine w reflex microscopic -     Basic metabolic panel  Type II diabetes mellitus with manifestations (HCC) -     Basic metabolic panel; Future -     Microalbumin / creatinine urine ratio; Future -     Microalbumin / creatinine urine ratio -     Basic metabolic panel  BPH associated with nocturia -     Cancel: PSA; Future -     Urinalysis, Routine w reflex microscopic; Future -     Urinalysis, Routine w reflex microscopic  B12 deficiency- H/H are normal now. -     CBC with Differential/Platelet; Future -     CBC with Differential/Platelet  Pure hypertriglyceridemia -     Lipid panel; Future -     Lipid panel  Dyslipidemia, goal LDL below 70- LDL goal achieved. Doing well on the statin  -     Lipid panel; Future -     TSH; Future -     TSH -     Lipid panel  Right upper quadrant abdominal tenderness without rebound tenderness- The examination is not consistent with an acute abdominal process.  Plain films are normal.  The only abnormality on labs is an elevated lipase.  His CT scan is only remarkable for fatty infiltration of the liver.  If his symptoms persist then I will consider doing an ultrasound to evaluate for gallbladder disease but his symptoms do not sound like gallbladder colic. -     DG ABD ACUTE 2+V W 1V CHEST; Future -  Lipase; Future -     Lipase -     CT Abdomen Pelvis W Contrast; Future  Elevated lipase -     CT Abdomen Pelvis W Contrast; Future  NASH (nonalcoholic steatohepatitis)- This could be causing his  discomfort.   I have discontinued Hardin Negus. Osoria's Krill Oil, DULoxetine, and esomeprazole. I am also having him maintain his cyanocobalamin, Testosterone, timolol, latanoprost, Probiotic Product (PROBIOTIC-10 PO), Vitamin D, aspirin EC, nitroGLYCERIN, Docusate Sodium, ticagrelor, alfuzosin, finasteride, lamoTRIgine, metoprolol tartrate, olmesartan, rosuvastatin, Zinc, methylphenidate, pantoprazole, and famotidine.  No orders of the defined types were placed in this encounter.  I spent 50 minutes in preparing to see the patient by review of recent labs, imaging, obtaining and reviewing separately obtained history, communicating with the patient, ordering labs and xrays, and documenting clinical information in the EHR including the differential Dx, treatment, and any further evaluation and management of multiple complex medical issues.     Follow-up: Return in about 3 months (around 03/18/2023).  Scarlette Calico, MD

## 2022-12-17 NOTE — Patient Instructions (Signed)

## 2022-12-18 ENCOUNTER — Ambulatory Visit
Admission: RE | Admit: 2022-12-18 | Discharge: 2022-12-18 | Disposition: A | Discharge status: Home / Self Care | Payer: PPO | Source: Ambulatory Visit | Attending: Internal Medicine | Admitting: Internal Medicine

## 2022-12-18 ENCOUNTER — Encounter: Payer: Self-pay | Admitting: Internal Medicine

## 2022-12-18 DIAGNOSIS — R10811 Right upper quadrant abdominal tenderness: Secondary | ICD-10-CM

## 2022-12-18 DIAGNOSIS — N281 Cyst of kidney, acquired: Secondary | ICD-10-CM | POA: Diagnosis not present

## 2022-12-18 DIAGNOSIS — M47816 Spondylosis without myelopathy or radiculopathy, lumbar region: Secondary | ICD-10-CM | POA: Diagnosis not present

## 2022-12-18 DIAGNOSIS — K5730 Diverticulosis of large intestine without perforation or abscess without bleeding: Secondary | ICD-10-CM | POA: Diagnosis not present

## 2022-12-18 DIAGNOSIS — K7581 Nonalcoholic steatohepatitis (NASH): Secondary | ICD-10-CM | POA: Insufficient documentation

## 2022-12-18 DIAGNOSIS — K760 Fatty (change of) liver, not elsewhere classified: Secondary | ICD-10-CM | POA: Diagnosis not present

## 2022-12-18 DIAGNOSIS — R748 Abnormal levels of other serum enzymes: Secondary | ICD-10-CM

## 2022-12-18 MED ORDER — IOPAMIDOL (ISOVUE-300) INJECTION 61%
100.0000 mL | Freq: Once | INTRAVENOUS | Status: AC | PRN
  Administered 2022-12-18: 100 mL via INTRAVENOUS

## 2022-12-22 DIAGNOSIS — H2513 Age-related nuclear cataract, bilateral: Secondary | ICD-10-CM | POA: Diagnosis not present

## 2022-12-22 DIAGNOSIS — H43813 Vitreous degeneration, bilateral: Secondary | ICD-10-CM | POA: Diagnosis not present

## 2022-12-22 DIAGNOSIS — H401131 Primary open-angle glaucoma, bilateral, mild stage: Secondary | ICD-10-CM | POA: Diagnosis not present

## 2022-12-28 DIAGNOSIS — H401131 Primary open-angle glaucoma, bilateral, mild stage: Secondary | ICD-10-CM | POA: Diagnosis not present

## 2022-12-29 ENCOUNTER — Telehealth: Payer: Self-pay | Admitting: *Deleted

## 2022-12-29 ENCOUNTER — Encounter: Payer: Self-pay | Admitting: Cardiovascular Disease

## 2022-12-29 NOTE — Telephone Encounter (Signed)
   Pre-operative Risk Assessment    Patient Name: Robert Mccall  DOB: July 07, 1951 MRN: [id number]      Request for Surgical Clearance    Procedure:   CATARACT WITH STENT HYDRUS  Date of Surgery:  Clearance 01/15/23                                 Surgeon:  DR. Tama High Surgeon's Group or Practice Name:  [facility] Phone number:  [phone] EXT [phone] Fax number:  [phone]   Type of Clearance Requested:   - Medical  - Pharmacy:  Hold Ticagrelor (Brilinta) 5-7 DAYS PRIOR TO PROCEDURE   Type of Anesthesia:   IV SEDATION    Additional requests/questions:    Jiles Prows   12/29/2022, 4:59 PM

## 2022-12-30 NOTE — Telephone Encounter (Signed)
   Name: Robert Mccall  DOB: [birth year]  MRN: [id number]  Primary Cardiologist: Mertie Moores, MD  Chart reviewed as part of pre-operative protocol coverage. The patient has an upcoming visit scheduled with Dr. Acie Fredrickson on 01/13/2023 at which time clearance can be addressed in case there are any issues that would impact surgical recommendations.  Cataract with stent hydrus is not scheduled until 01/15/2023 as below. I added preop FYI to appointment note so that provider is aware to address at time of outpatient visit.  Per office protocol the cardiology provider should forward their finalized clearance decision and recommendations regarding antiplatelet therapy to the requesting party below.    Pt post DES on 01/07/2022. He may hold brilinta x 5 days prior to procedure beginning 01/09/2023 as he will have completed 1 year of DAPT at that time. He should continue ASA throughout the perioperative period. Please resume brilinta as soon as possible postprocedure, at the discretion of the surgeon.   I will route this message as FYI to requesting party and remove this message from the preop box as separate preop APP input not needed at this time.   Please call with any questions.  Lenna Sciara, NP  12/30/2022, 7:15 AM

## 2023-01-01 ENCOUNTER — Encounter: Payer: Self-pay | Admitting: Internal Medicine

## 2023-01-10 ENCOUNTER — Other Ambulatory Visit: Payer: Self-pay | Admitting: Internal Medicine

## 2023-01-10 ENCOUNTER — Encounter: Payer: Self-pay | Admitting: Internal Medicine

## 2023-01-10 DIAGNOSIS — R053 Chronic cough: Secondary | ICD-10-CM

## 2023-01-11 ENCOUNTER — Other Ambulatory Visit: Payer: Self-pay

## 2023-01-11 ENCOUNTER — Encounter: Payer: Self-pay | Admitting: Internal Medicine

## 2023-01-11 ENCOUNTER — Other Ambulatory Visit: Payer: Self-pay | Admitting: Internal Medicine

## 2023-01-11 DIAGNOSIS — F900 Attention-deficit hyperactivity disorder, predominantly inattentive type: Secondary | ICD-10-CM

## 2023-01-11 DIAGNOSIS — R053 Chronic cough: Secondary | ICD-10-CM

## 2023-01-11 MED ORDER — PANTOPRAZOLE SODIUM 40 MG PO TBEC: 40.0000 mg | DELAYED_RELEASE_TABLET | Freq: Every day | ORAL | 2 refills | Status: DC

## 2023-01-11 MED ORDER — METHYLPHENIDATE HCL ER 20 MG PO TBCR: 20.0000 mg | EXTENDED_RELEASE_TABLET | Freq: Every day | ORAL | 0 refills | Status: DC

## 2023-01-13 ENCOUNTER — Ambulatory Visit: Payer: PPO | Attending: Cardiovascular Disease | Admitting: Cardiovascular Disease

## 2023-01-13 ENCOUNTER — Encounter: Payer: Self-pay | Admitting: Cardiovascular Disease

## 2023-01-13 VITALS — BP 122/76 | HR 64 | Ht 64.0 in | Wt 168.2 lb

## 2023-01-13 DIAGNOSIS — I2510 Atherosclerotic heart disease of native coronary artery without angina pectoris: Secondary | ICD-10-CM | POA: Diagnosis not present

## 2023-01-13 DIAGNOSIS — I5032 Chronic diastolic (congestive) heart failure: Secondary | ICD-10-CM | POA: Diagnosis not present

## 2023-01-13 MED ORDER — CLOPIDOGREL BISULFATE 75 MG PO TABS: 75.0000 mg | ORAL_TABLET | Freq: Every day | ORAL | 3 refills | Status: DC

## 2023-01-13 NOTE — Patient Instructions (Addendum)
Medication Instructions:  STOP Brilinta START Clopidogrel '75mg'$  daily (starting Saturday 01/16/23)**Should take 2 tablets the first day for '150mg'$  loading dose** *If you need a refill on your cardiac medications before your next appointment, please call your pharmacy*  Lab Work: NONE If you have labs (blood work) drawn today and your tests are completely normal, you will receive your results only by: [place] (if you have MyChart) OR A paper copy in the mail If you have any lab test that is abnormal or we need to change your treatment, we will call you to review the results.  Testing/Procedures: NONE/Cleared for upcoming surgery   Follow-Up: At [facility] you and your health needs are our priority.  As part of our continuing mission to provide you with exceptional heart care, we have created designated Provider Care Teams.  These Care Teams include your primary Cardiologist (physician) and Advanced Practice Providers (APPs -  Physician Assistants and Nurse Practitioners) who all work together to provide you with the care you need, when you need it.  Your next appointment:   6 month(s)  Provider:   Christen Bame, NP

## 2023-01-13 NOTE — Telephone Encounter (Signed)
   Patient Name: Robert Mccall  DOB: 03/19/1951 MRN: [id number]  Primary Cardiologist: Mertie Moores, MD  Chart reviewed as part of pre-operative protocol coverage.   Pt was seen by Dr. Acie Fredrickson on 01/13/2023 and the following recommendation were made:   "Pt needs to have 2 separate cataract operations.He was on Brilinta and the Kary Kos has been held since since this past Monday.   Following his first cataract surgery, he will restart Plavix 75 mg a day.  He will take that for a week and then should hold his Plavix for 5 to 7 days prior to his second cataract surgery.   He is at overall low risk for his upcoming 2 cataract surgeries"  Therefore, as above, [name] is at acceptable risk for the planned procedure without further cardiovascular testing.   I will route this recommendation to the requesting party via Epic fax function and remove from pre-op pool.  Please call with questions.  Lenna Sciara, NP 01/13/2023, 11:37 AM

## 2023-01-13 NOTE — Progress Notes (Signed)
Cardiology Office Note:    Date:  01/13/2023   ID:  Robert Mccall, DOB [birth year], MRN [id number]  PCP:  [name], MD  Cardiologist:  Mertie Moores, MD   Referring MD: [name], MD   Problem List 1. TIA 2. Chronic diastolic CHF 3. HTN 4. Hyperlipidemia    Chief Complaint  Patient presents with   Coronary Artery Disease         April 04, 2018:     Robert Mccall is a 72 y.o. male with a hx of hypertension, hyperlipidemia,  Chronic diastolic CHF  Resents for further evaluation of chest discomfort.  Tracks his BP regularly ,  BP cuff will alert him if his heart rate is irregular.  He can feel these palpitations.  They occur spontaneously and might last for as long as 15 to 20 minutes.  They typically resolve spontaneously.  These are not associated with any specific activity.  They are not associated with any particular time a day.  Does not do any regular exercise .   Works in [employer].   Oct. 23, 2019:  Has been having trouble with his hearing that she has Mnire's disease.  He denies any episodes of chest pain or shortness of breath.  Blood pressure and heart rate are well controlled today.  Lipids from last week look good on Crestor 10 a day   Nov. 2, 2020 Robert Mccall is seen today for follow up of his HTN and chronic diastolic CHF No cp, no dyspnea.   Not exercising  Much.   Works at [employer].   May 27, 2020: Robert Mccall is seen today for follow-up for his hypertension and chronic diastolic congestive heart failure. No CP or dyspnea.   He is very hard of hearing  -    We ordered a coronary CT angiogram.  Coronary calcium score of 523. This was 76th percentile for age and sex matched control. Coronary angiogram revealed moderate disease in the proximal/mid LAD. There was concern for possible obstruction just distal to the first diagonal at the level of the second diagonal.  There is no disease in the LAD distal to the  second diagonal.  FFR did not show any significant obstructive disease.   We discussed the importance of lipid management.  His most recent labs are from December, 2020.  His LDL is 76.  The triglyceride level is 299.  The total cholesterol is 157.  The HDL is 35.7.  No angina .   Has some CP at night when he is lying down.    Is not getting regular exercise - lots of back issues.    Dec. 13, 2021: Robert Mccall is seen today for follow up of his moderate CAD, chronic diastolic CHF Has coronary calcium score is 523 Had a random CP one night.  Likely was indigestion    Jan. 16, 2023 Robert Mccall is seen today for follow up of his CAD, chronic diastolic CHF He has been having chest pain  - may be related to stress Coronary CT angio from May, 2021 shows a CAC score of 523. 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  Is not exercising  CP does not worsen with exertion - does not do much, limited by back issues.  Radiates through to his back Tried NTG 1 day - did not relieve his CP .   I considered myoview study -  he has known moderate disease so im not sure that this would be beneficial   June 16, 2022 Robert Mccall is seen today for follow up of his CAD, chronic diastolic CHF. He had a cath Feb. 1, 2023 Successful stenting of the prox LAD,  PTCA of the diag  No CP Has atypical cp - is not his angina  Has a ventral hernia   Feb. 7, 2024  Robert Mccall is here to discuss changing from Brilinta to Plavix ( to save money)  Having cataract surgery later this week  Brilinta is currently on hold  Will restart Plavix for 1 week and then is planning on having the other eye cataract removed.  He will hold the Plavix 1 week prior to the neck surgery as well.  This should reduce the total amount of time that he is off the Plavix.  He is at low risk for his  upcoming cataract surgeries.  Past Medical History:  Diagnosis Date   ADHD (attention deficit hyperactivity disorder)    Anxiety    Benign prostatic hypertrophy    Chronic lower back pain    surgery schedule for back 10/21/16   Cyst of right kidney    incidental Bosiak 1.3cm on R (MRI abd 09/2014), follows with uro   Depression    GERD (gastroesophageal reflux disease)    Glaucoma    Hard of hearing    History of migraine headaches    no meds   Hyperlipidemia    Hypertension    Menetrier's disease     Past Surgical History:  Procedure Laterality Date   COLONOSCOPY  09/2007   polyps/ Deatra Ina   HYDROCELE EXCISION Right 1990   LEFT HEART CATH AND CORONARY ANGIOGRAPHY N/A 01/07/2022   Procedure: LEFT HEART CATH AND CORONARY ANGIOGRAPHY;  Surgeon: Leonie Man, MD;  Location: [facility];  Service: Cardiovascular;  Laterality: N/A;   LUMBAR DISC SURGERY  10/21/2016   ORCHIECTOMY Left 1970   WISDOM TOOTH EXTRACTION      Current Medications: Current Meds  Medication Sig   alfuzosin (UROXATRAL) 10 MG 24 hr tablet Take 1 tablet (10 mg total) by mouth daily with breakfast.   aspirin EC 81 MG tablet Take 1 tablet (81 mg total) by mouth daily. Swallow whole.   Cholecalciferol (VITAMIN D) 50 MCG (2000 UT) CAPS Take 1 capsule (2,000 Units total) by mouth daily.   Docusate Sodium 100 MG capsule Take 100 mg by mouth 2 (two) times daily.   famotidine (PEPCID) 20 MG tablet One after supper   finasteride (PROSCAR) 5 MG tablet Take 1 tablet (5 mg total) by mouth daily.   lamoTRIgine (LAMICTAL) 25 MG tablet Take 1 tablet (25 mg total) by mouth 2 (two) times daily.   latanoprost (XALATAN) 0.005 % ophthalmic solution Place 1 drop into both eyes at bedtime.   methylphenidate (METADATE ER) 20 MG ER tablet Take 1 tablet (20 mg total) by mouth daily.   metoprolol tartrate (LOPRESSOR) 25 MG tablet Take 1.5 tablets (37.5 mg total) by mouth 2 (two) times daily.   Multiple Vitamins-Minerals  (ICAPS AREDS 2 PO) Take by mouth.   nitroGLYCERIN (NITROSTAT) 0.4 MG SL tablet Place 1 tablet (0.4 mg total) under the tongue every 5 (five) minutes as needed for chest pain (Call 911 if 3 doses does not relieve pain.).   olmesartan (  BENICAR) 40 MG tablet TAKE 1 TABLET(40 MG) BY MOUTH DAILY   pantoprazole (PROTONIX) 40 MG tablet Take 1 tablet (40 mg total) by mouth daily. Take 30-60 min before first meal of the day   Probiotic Product (PROBIOTIC-10 PO) Take 1 capsule by mouth daily.   rosuvastatin (CRESTOR) 20 MG tablet TAKE 1 TABLET(20 MG) BY MOUTH DAILY   Taurine 1000 MG CAPS Take 1 capsule by mouth daily in the afternoon.   Testosterone 20.25 MG/ACT (1.62%) GEL APPLY 2 ACTUATIONS ONTO THE SKIN DAILY   ticagrelor (BRILINTA) 90 MG TABS tablet Take 1 tablet by mouth 2 times daily.   timolol (TIMOPTIC) 0.5 % ophthalmic solution INT 1 GTT IN EACH EYE QAM   vitamin B-12 (CYANOCOBALAMIN) 1000 MCG tablet Take 1,000 mcg by mouth daily.   Zinc 50 MG TABS Take by mouth.     Allergies:   Patient has no known allergies.   Social History   Socioeconomic History   Marital status: Single    Spouse name: Not on file   Number of children: Not on file   Years of education: Not on file   Highest education level: Not on file  Occupational History   Occupation: Freight forwarder    Comment: [employer]  Tobacco Use   Smoking status: Never    Passive exposure: Never   Smokeless tobacco: Never  Vaping Use   Vaping Use: Never used  Substance and Sexual Activity   Alcohol use: Yes    Comment: occ   Drug use: No   Sexual activity: Yes    Comment: working with replacement  Other Topics Concern   Not on file  Social History Narrative   Not on file   Social Determinants of Health   Financial Resource Strain: Low Risk  (01/11/2021)   Overall Financial Resource Strain (CARDIA)    Difficulty of Paying Living Expenses: Not hard at all  Food Insecurity: Not on file  Transportation Needs: Not  on file  Physical Activity: Not on file  Stress: Not on file  Social Connections: Not on file     Family History: The patient's family history includes Breast cancer in his maternal grandmother; Breast cancer (age of onset: 9) in his mother; COPD in his mother; Cancer in his father; Depression in his mother; Diabetes in his father; Heart disease in his mother; Macular degeneration in his father; Osteoarthritis in his mother. There is no history of Colon cancer, Esophageal cancer, Rectal cancer, or Stomach cancer.  ROS:   Please see the history of present illness.     All other systems reviewed and are negative.  EKGs/Labs/Other Studies Reviewed:    The following studies were reviewed today:    Recent Labs: 09/11/2022: ALT 40 12/17/2022: BUN 17; Creatinine, Ser 1.24; Hemoglobin 14.9; Platelets 240.0; Potassium 4.1; Sodium 138; TSH 2.37  Recent Lipid Panel    Component Value Date/Time   CHOL 122 12/17/2022 1115   CHOL 107 05/27/2020 1239   TRIG 191.0 (H) 12/17/2022 1115   HDL 38.40 (L) 12/17/2022 1115   HDL 31 (L) 05/27/2020 1239   CHOLHDL 3 12/17/2022 1115   VLDL 38.2 12/17/2022 1115   LDLCALC 45 12/17/2022 1115   LDLCALC 52 05/27/2020 1239   LDLDIRECT 76.0 11/29/2019 0901    Physical Exam: Blood pressure 122/76, pulse 64, height '5\' 4"'$  (1.626 m), weight 168 lb 3.2 oz (76.3 kg), SpO2 95 %.       GEN:  Well nourished, well developed in no acute  distress HEENT: Normal NECK: No JVD; No carotid bruits LYMPHATICS: No lymphadenopathy CARDIAC: RRR , no murmurs, rubs, gallops RESPIRATORY:  Clear to auscultation without rales, wheezing or rhonchi  ABDOMEN: Soft, non-tender, non-distended MUSCULOSKELETAL:  No edema; No deformity  SKIN: Warm and dry NEUROLOGIC:  Alert and oriented x 3   EKG:     January 13, 2023: Normal sinus rhythm at 64.  Occasional premature atrial contraction.  Otherwise normal EKG.   ASSESSMENT:    No diagnosis found.    PLAN:     1.  Hypertension-           3.  Unstable angina :       no further episodes of CP he would like to change from Brilinta to Plavix in order to save cost.  He is scheduled for 2 separate cataract surgeries over the next several weeks.  Brilinta is already on hold.  Will have him restart Plavix 75 mg a day day or so after his first cataract surgery.  He will then be okay to hold his Plavix for 5 to 7 days prior to his next cataract surgery.  He is at low risk for his upcoming cataract surgeries.   4.   Chronic diastolic CHF:       Stable.        Medication Adjustments/Labs and Tests Ordered: Current medicines are reviewed at length with the patient today.  Concerns regarding medicines are outlined above.  No orders of the defined types were placed in this encounter.  No orders of the defined types were placed in this encounter.    There are no Patient Instructions on file for this visit.   Signed, Mertie Moores, MD  01/13/2023 10:32 AM    [place]

## 2023-01-13 NOTE — Telephone Encounter (Signed)
Pt needs to have 2 separate cataract operations. He was on Brilinta and the Kary Kos has been held since since this past Monday.  Following his first cataract surgery, he will restart Plavix 75 mg a day.  He will take that for a week and then should hold his Plavix for 5 to 7 days prior to his second cataract surgery.  He is at overall low risk for his upcoming 2 cataract surgeries.

## 2023-01-14 NOTE — Telephone Encounter (Signed)
CORRECTION: FAX # ENTERED INCORRECTLY ON CLEARANCE REQUEST, MY APOLOGIES.   CORRECT FAX# [phone]. I WILL REFAX NOTES.

## 2023-01-14 NOTE — Telephone Encounter (Signed)
[employer] is calling to request another fax of clearance be sent. Requesting be sent to fax below and not the one on pre-op form.   Fax:  [phone]

## 2023-01-15 DIAGNOSIS — H401111 Primary open-angle glaucoma, right eye, mild stage: Secondary | ICD-10-CM | POA: Diagnosis not present

## 2023-01-15 DIAGNOSIS — H52221 Regular astigmatism, right eye: Secondary | ICD-10-CM | POA: Diagnosis not present

## 2023-01-15 DIAGNOSIS — H409 Unspecified glaucoma: Secondary | ICD-10-CM | POA: Diagnosis not present

## 2023-01-15 DIAGNOSIS — H2511 Age-related nuclear cataract, right eye: Secondary | ICD-10-CM | POA: Diagnosis not present

## 2023-01-15 DIAGNOSIS — H269 Unspecified cataract: Secondary | ICD-10-CM | POA: Diagnosis not present

## 2023-01-24 ENCOUNTER — Encounter: Payer: Self-pay | Admitting: Internal Medicine

## 2023-01-24 DIAGNOSIS — I10 Essential (primary) hypertension: Secondary | ICD-10-CM

## 2023-01-25 MED ORDER — OLMESARTAN MEDOXOMIL 40 MG PO TABS: ORAL_TABLET | ORAL | 0 refills | Status: DC

## 2023-01-29 DIAGNOSIS — H409 Unspecified glaucoma: Secondary | ICD-10-CM | POA: Diagnosis not present

## 2023-01-29 DIAGNOSIS — H2511 Age-related nuclear cataract, right eye: Secondary | ICD-10-CM | POA: Diagnosis not present

## 2023-01-29 DIAGNOSIS — H524 Presbyopia: Secondary | ICD-10-CM | POA: Diagnosis not present

## 2023-01-29 DIAGNOSIS — H52223 Regular astigmatism, bilateral: Secondary | ICD-10-CM | POA: Diagnosis not present

## 2023-01-29 DIAGNOSIS — H2512 Age-related nuclear cataract, left eye: Secondary | ICD-10-CM | POA: Diagnosis not present

## 2023-01-29 DIAGNOSIS — H52222 Regular astigmatism, left eye: Secondary | ICD-10-CM | POA: Diagnosis not present

## 2023-01-29 DIAGNOSIS — H5201 Hypermetropia, right eye: Secondary | ICD-10-CM | POA: Diagnosis not present

## 2023-01-29 DIAGNOSIS — H269 Unspecified cataract: Secondary | ICD-10-CM | POA: Diagnosis not present

## 2023-01-29 DIAGNOSIS — H43813 Vitreous degeneration, bilateral: Secondary | ICD-10-CM | POA: Diagnosis not present

## 2023-01-29 DIAGNOSIS — H401131 Primary open-angle glaucoma, bilateral, mild stage: Secondary | ICD-10-CM | POA: Diagnosis not present

## 2023-02-07 ENCOUNTER — Encounter: Payer: Self-pay | Admitting: Internal Medicine

## 2023-02-08 ENCOUNTER — Other Ambulatory Visit: Payer: Self-pay | Admitting: Internal Medicine

## 2023-02-08 DIAGNOSIS — F331 Major depressive disorder, recurrent, moderate: Secondary | ICD-10-CM

## 2023-02-08 DIAGNOSIS — N401 Enlarged prostate with lower urinary tract symptoms: Secondary | ICD-10-CM

## 2023-02-08 MED ORDER — FINASTERIDE 5 MG PO TABS: 5.0000 mg | ORAL_TABLET | Freq: Every day | ORAL | 0 refills | Status: DC

## 2023-02-08 MED ORDER — LAMOTRIGINE 25 MG PO TABS: 25.0000 mg | ORAL_TABLET | Freq: Two times a day (BID) | ORAL | 0 refills | Status: DC

## 2023-03-11 ENCOUNTER — Other Ambulatory Visit: Payer: Self-pay | Admitting: Internal Medicine

## 2023-03-11 DIAGNOSIS — K219 Gastro-esophageal reflux disease without esophagitis: Secondary | ICD-10-CM

## 2023-03-11 DIAGNOSIS — R053 Chronic cough: Secondary | ICD-10-CM

## 2023-03-11 MED ORDER — PANTOPRAZOLE SODIUM 40 MG PO TBEC: 40.0000 mg | DELAYED_RELEASE_TABLET | Freq: Every day | ORAL | 0 refills | Status: DC

## 2023-03-14 ENCOUNTER — Encounter: Payer: Self-pay | Admitting: Internal Medicine

## 2023-03-16 ENCOUNTER — Encounter: Payer: Self-pay | Admitting: Internal Medicine

## 2023-03-16 ENCOUNTER — Other Ambulatory Visit: Payer: Self-pay | Admitting: Internal Medicine

## 2023-03-16 DIAGNOSIS — R053 Chronic cough: Secondary | ICD-10-CM

## 2023-03-16 DIAGNOSIS — E785 Hyperlipidemia, unspecified: Secondary | ICD-10-CM

## 2023-03-16 DIAGNOSIS — K219 Gastro-esophageal reflux disease without esophagitis: Secondary | ICD-10-CM

## 2023-03-16 DIAGNOSIS — F900 Attention-deficit hyperactivity disorder, predominantly inattentive type: Secondary | ICD-10-CM

## 2023-03-16 DIAGNOSIS — I25119 Atherosclerotic heart disease of native coronary artery with unspecified angina pectoris: Secondary | ICD-10-CM

## 2023-03-16 MED ORDER — FAMOTIDINE 20 MG PO TABS: 20.0000 mg | ORAL_TABLET | Freq: Every day | ORAL | 1 refills | Status: DC

## 2023-03-16 MED ORDER — ROSUVASTATIN CALCIUM 20 MG PO TABS: 20.0000 mg | ORAL_TABLET | Freq: Every day | ORAL | 1 refills | Status: DC

## 2023-03-16 MED ORDER — METHYLPHENIDATE HCL ER 20 MG PO TBCR: 20.0000 mg | EXTENDED_RELEASE_TABLET | Freq: Every day | ORAL | 0 refills | Status: DC

## 2023-03-18 ENCOUNTER — Ambulatory Visit: Payer: PPO | Admitting: Internal Medicine

## 2023-04-01 ENCOUNTER — Ambulatory Visit (INDEPENDENT_AMBULATORY_CARE_PROVIDER_SITE_OTHER): Payer: PPO | Admitting: Internal Medicine

## 2023-04-01 ENCOUNTER — Encounter: Payer: Self-pay | Admitting: Internal Medicine

## 2023-04-01 VITALS — BP 130/76 | HR 66 | Temp 98.2°F | Resp 16 | Ht 64.0 in | Wt 164.0 lb

## 2023-04-01 DIAGNOSIS — N401 Enlarged prostate with lower urinary tract symptoms: Secondary | ICD-10-CM

## 2023-04-01 DIAGNOSIS — I10 Essential (primary) hypertension: Secondary | ICD-10-CM | POA: Diagnosis not present

## 2023-04-01 DIAGNOSIS — E118 Type 2 diabetes mellitus with unspecified complications: Secondary | ICD-10-CM

## 2023-04-01 DIAGNOSIS — G478 Other sleep disorders: Secondary | ICD-10-CM

## 2023-04-01 DIAGNOSIS — R351 Nocturia: Secondary | ICD-10-CM

## 2023-04-01 DIAGNOSIS — Z0000 Encounter for general adult medical examination without abnormal findings: Secondary | ICD-10-CM

## 2023-04-01 DIAGNOSIS — Z0001 Encounter for general adult medical examination with abnormal findings: Secondary | ICD-10-CM

## 2023-04-01 LAB — BASIC METABOLIC PANEL
BUN: 19 mg/dL (ref 6–23)
CO2: 29 mEq/L (ref 19–32)
Calcium: 9.3 mg/dL (ref 8.4–10.5)
Chloride: 102 mEq/L (ref 96–112)
Creatinine, Ser: 1.12 mg/dL (ref 0.40–1.50)
GFR: 65.85 mL/min (ref >60.00)
Glucose, Bld: 98 mg/dL (ref 70–99)
Potassium: 4.4 mEq/L (ref 3.5–5.1)
Sodium: 138 mEq/L (ref 135–145)

## 2023-04-01 LAB — HEMOGLOBIN A1C: Hgb A1c MFr Bld: 5.9 % (ref 4.6–6.5)

## 2023-04-01 LAB — PSA: PSA: 0.35 ng/mL (ref 0.10–4.00)

## 2023-04-01 NOTE — Patient Instructions (Signed)
Health Maintenance, Male Adopting a healthy lifestyle and getting preventive care are important in promoting health and wellness. Ask your health care provider about: The right schedule for you to have regular tests and exams. Things you can do on your own to prevent diseases and keep yourself healthy. What should I know about diet, weight, and exercise? Eat a healthy diet  Eat a diet that includes plenty of vegetables, fruits, low-fat dairy products, and lean protein. Do not eat a lot of foods that are high in solid fats, added sugars, or sodium. Maintain a healthy weight Body mass index (BMI) is a measurement that can be used to identify possible weight problems. It estimates body fat based on height and weight. Your health care provider can help determine your BMI and help you achieve or maintain a healthy weight. Get regular exercise Get regular exercise. This is one of the most important things you can do for your health. Most adults should: Exercise for at least 150 minutes each week. The exercise should increase your heart rate and make you sweat (moderate-intensity exercise). Do strengthening exercises at least twice a week. This is in addition to the moderate-intensity exercise. Spend less time sitting. Even light physical activity can be beneficial. Watch cholesterol and blood lipids Have your blood tested for lipids and cholesterol at 72 years of age, then have this test every 5 years. You may need to have your cholesterol levels checked more often if: Your lipid or cholesterol levels are high. You are older than 72 years of age. You are at high risk for heart disease. What should I know about cancer screening? Many types of cancers can be detected early and may often be prevented. Depending on your health history and family history, you may need to have cancer screening at various ages. This may include screening for: Colorectal cancer. Prostate cancer. Skin cancer. Lung  cancer. What should I know about heart disease, diabetes, and high blood pressure? Blood pressure and heart disease High blood pressure causes heart disease and increases the risk of stroke. This is more likely to develop in people who have high blood pressure readings or are overweight. Talk with your health care provider about your target blood pressure readings. Have your blood pressure checked: Every 3-5 years if you are 18-39 years of age. Every year if you are 40 years old or older. If you are between the ages of 65 and 75 and are a current or former smoker, ask your health care provider if you should have a one-time screening for abdominal aortic aneurysm (AAA). Diabetes Have regular diabetes screenings. This checks your fasting blood sugar level. Have the screening done: Once every three years after age 45 if you are at a normal weight and have a low risk for diabetes. More often and at a younger age if you are overweight or have a high risk for diabetes. What should I know about preventing infection? Hepatitis B If you have a higher risk for hepatitis B, you should be screened for this virus. Talk with your health care provider to find out if you are at risk for hepatitis B infection. Hepatitis C Blood testing is recommended for: Everyone born from 1945 through 1965. Anyone with known risk factors for hepatitis C. Sexually transmitted infections (STIs) You should be screened each year for STIs, including gonorrhea and chlamydia, if: You are sexually active and are younger than 72 years of age. You are older than 72 years of age and your   health care provider tells you that you are at risk for this type of infection. Your sexual activity has changed since you were last screened, and you are at increased risk for chlamydia or gonorrhea. Ask your health care provider if you are at risk. Ask your health care provider about whether you are at high risk for HIV. Your health care provider  may recommend a prescription medicine to help prevent HIV infection. If you choose to take medicine to prevent HIV, you should first get tested for HIV. You should then be tested every 3 months for as long as you are taking the medicine. Follow these instructions at home: Alcohol use Do not drink alcohol if your health care provider tells you not to drink. If you drink alcohol: Limit how much you have to 0-2 drinks a day. Know how much alcohol is in your drink. In the [place] one drink equals one 12 oz bottle of beer (355 mL), one 5 oz glass of wine (148 mL), or one 1 oz glass of hard liquor (44 mL). Lifestyle Do not use any products that contain nicotine or tobacco. These products include cigarettes, chewing tobacco, and vaping devices, such as e-cigarettes. If you need help quitting, ask your health care provider. Do not use street drugs. Do not share needles. Ask your health care provider for help if you need support or information about quitting drugs. General instructions Schedule regular health, dental, and eye exams. Stay current with your vaccines. Tell your health care provider if: You often feel depressed. You have ever been abused or do not feel safe at home. Summary Adopting a healthy lifestyle and getting preventive care are important in promoting health and wellness. Follow your health care provider's instructions about healthy diet, exercising, and getting tested or screened for diseases. Follow your health care provider's instructions on monitoring your cholesterol and blood pressure. This information is not intended to replace advice given to you by your health care provider. Make sure you discuss any questions you have with your health care provider. Document Revised: 04/14/2021 Document Reviewed: 04/14/2021 Elsevier Patient Education  2023 [employer]  

## 2023-04-01 NOTE — Progress Notes (Signed)
Subjective:  Patient ID: Robert Mccall, male    DOB: [birth year]  Age: 72 y.o. MRN: [id number]  CC: Annual Exam, Hypertension, Diabetes, Hyperlipidemia, and Coronary Artery Disease   HPI Robert Mccall presents for a CPX and f/up ---  He complains of nonrestorative sleep and fatigue.  He tells me he does not think he has trouble sleeping but also tells me he takes Tylenol PM.  I asked him why he takes Tylenol PM and he says his father did that and he does not think he can sleep without it.  He is active and denies chest pain, shortness of breath, diaphoresis, or edema.  Outpatient Medications Prior to Visit  Medication Sig Dispense Refill   alfuzosin (UROXATRAL) 10 MG 24 hr tablet Take 1 tablet (10 mg total) by mouth daily with breakfast. 90 tablet 1   aspirin EC 81 MG tablet Take 1 tablet (81 mg total) by mouth daily. Swallow whole. 90 tablet 3   Cholecalciferol (VITAMIN D) 50 MCG (2000 UT) CAPS Take 1 capsule (2,000 Units total) by mouth daily. 30 capsule 11   clopidogrel (PLAVIX) 75 MG tablet Take 1 tablet (75 mg total) by mouth daily. 90 tablet 3   Docusate Sodium 100 MG capsule Take 100 mg by mouth 2 (two) times daily.     famotidine (PEPCID) 20 MG tablet Take 1 tablet (20 mg total) by mouth daily. One after supper 90 tablet 1   finasteride (PROSCAR) 5 MG tablet Take 1 tablet (5 mg total) by mouth daily. 90 tablet 0   lamoTRIgine (LAMICTAL) 25 MG tablet Take 1 tablet (25 mg total) by mouth 2 (two) times daily. 180 tablet 0   latanoprost (XALATAN) 0.005 % ophthalmic solution Place 1 drop into both eyes at bedtime.     methylphenidate (METADATE ER) 20 MG ER tablet Take 1 tablet (20 mg total) by mouth daily. 90 tablet 0   metoprolol tartrate (LOPRESSOR) 25 MG tablet Take 1.5 tablets (37.5 mg total) by mouth 2 (two) times daily. 270 tablet 1   Multiple Vitamins-Minerals (ICAPS AREDS 2 PO) Take by mouth.     nitroGLYCERIN (NITROSTAT) 0.4 MG SL tablet Place 1 tablet (0.4 mg total)  under the tongue every 5 (five) minutes as needed for chest pain (Call 911 if 3 doses does not relieve pain.). 25 tablet 3   olmesartan (BENICAR) 40 MG tablet TAKE 1 TABLET(40 MG) BY MOUTH DAILY 90 tablet 0   pantoprazole (PROTONIX) 40 MG tablet Take 1 tablet (40 mg total) by mouth daily. Take 30-60 min before first meal of the day 90 tablet 0   Probiotic Product (PROBIOTIC-10 PO) Take 1 capsule by mouth daily.     rosuvastatin (CRESTOR) 20 MG tablet Take 1 tablet (20 mg total) by mouth daily. 90 tablet 1   Taurine 1000 MG CAPS Take 1 capsule by mouth daily in the afternoon.     timolol (TIMOPTIC) 0.5 % ophthalmic solution INT 1 GTT IN EACH EYE QAM  0   vitamin B-12 (CYANOCOBALAMIN) 1000 MCG tablet Take 1,000 mcg by mouth daily.     Zinc 50 MG TABS Take by mouth.     No facility-administered medications prior to visit.    ROS Review of Systems  Constitutional:  Positive for fatigue. Negative for unexpected weight change.  HENT:  Positive for hearing loss.   Eyes: Negative.   Respiratory:  Negative for apnea, cough, chest tightness, shortness of breath and wheezing.   Cardiovascular:  Negative for  chest pain, palpitations and leg swelling.  Gastrointestinal:  Negative for abdominal pain, constipation, nausea and vomiting.  Endocrine: Negative.   Genitourinary:  Negative for difficulty urinating, dysuria and hematuria.  Musculoskeletal: Negative.  Negative for myalgias.  Skin: Negative.  Negative for color change and rash.  Hematological:  Negative for adenopathy. Does not bruise/bleed easily.  Psychiatric/Behavioral:  Positive for decreased concentration, dysphoric mood and sleep disturbance. Negative for confusion and suicidal ideas. The patient is not nervous/anxious.     Objective:  BP 130/76 (BP Location: Right Arm, Patient Position: Sitting, Cuff Size: Large)   Pulse 66   Temp 98.2 F (36.8 C) (Oral)   Resp 16   Ht 5\' 4"  (1.626 m)   Wt 164 lb (74.4 kg)   SpO2 93%   BMI 28.15  kg/m   BP Readings from Last 3 Encounters:  04/01/23 130/76  01/13/23 122/76  12/17/22 122/76    Wt Readings from Last 3 Encounters:  04/01/23 164 lb (74.4 kg)  01/13/23 168 lb 3.2 oz (76.3 kg)  12/17/22 169 lb (76.7 kg)    Physical Exam Vitals reviewed.  Constitutional:      Appearance: Normal appearance.  HENT:     Nose: Nose normal.     Mouth/Throat:     Mouth: Mucous membranes are moist.  Eyes:     General: No scleral icterus.    Conjunctiva/sclera: Conjunctivae normal.  Cardiovascular:     Rate and Rhythm: Normal rate and regular rhythm.     Heart sounds: No murmur heard. Pulmonary:     Effort: Pulmonary effort is normal.     Breath sounds: No stridor. No wheezing, rhonchi or rales.  Abdominal:     General: Abdomen is flat.     Palpations: There is no mass.     Tenderness: There is no abdominal tenderness. There is no guarding.     Hernia: No hernia is present. There is no hernia in the left inguinal area or right inguinal area.  Genitourinary:    Pubic Area: No rash.      Penis: Normal and circumcised.      Testes: Normal.     Epididymis:     Left: Normal.     Prostate: Not enlarged, not tender and no nodules present.     Rectum: Normal. Guaiac result negative. No mass, tenderness, anal fissure, external hemorrhoid or internal hemorrhoid. Normal anal tone.  Musculoskeletal:        General: Normal range of motion.     Cervical back: Neck supple.     Right lower leg: No edema.     Left lower leg: No edema.  Lymphadenopathy:     Cervical: No cervical adenopathy.     Lower Body: No right inguinal adenopathy. No left inguinal adenopathy.  Skin:    General: Skin is warm and dry.  Neurological:     General: No focal deficit present.     Mental Status: He is alert. Mental status is at baseline.  Psychiatric:        Mood and Affect: Mood normal.        Behavior: Behavior normal.     Lab Results  Component Value Date   WBC 8.6 12/17/2022   HGB 14.9  12/17/2022   HCT 43.7 12/17/2022   PLT 240.0 12/17/2022   GLUCOSE 98 04/01/2023   CHOL 122 12/17/2022   TRIG 191.0 (H) 12/17/2022   HDL 38.40 (L) 12/17/2022   LDLDIRECT 76.0 11/29/2019   LDLCALC 45 12/17/2022  ALT 40 09/11/2022   AST 31 09/11/2022   NA 138 04/01/2023   K 4.4 04/01/2023   CL 102 04/01/2023   CREATININE 1.12 04/01/2023   BUN 19 04/01/2023   CO2 29 04/01/2023   TSH 2.37 12/17/2022   PSA 0.35 04/01/2023   INR 1.04 01/23/2017   HGBA1C 5.9 04/01/2023   MICROALBUR 4.4 (H) 12/17/2022    CT Abdomen Pelvis W Contrast  Result Date: 12/18/2022 CLINICAL DATA:  Right lower quadrant abdominal pain 4 3 months. Elevated lipase EXAM: CT ABDOMEN AND PELVIS WITH CONTRAST TECHNIQUE: Multidetector CT imaging of the abdomen and pelvis was performed using the standard protocol following bolus administration of intravenous contrast. RADIATION DOSE REDUCTION: This exam was performed according to the departmental dose-optimization program which includes automated exposure control, adjustment of the mA and/or kV according to patient size and/or use of iterative reconstruction technique. CONTRAST:  ISOVUE-300 IOPAMIDOL (ISOVUE-300) INJECTION 61% COMPARISON:  Acute abdominal series 12/17/2022. CT of the abdomen and pelvis without and with contrast 04/02/2014 FINDINGS: Lower chest: 3 mm nodule in the right lower lobe on image 8 of series 4 is stable overall most 9 years. Recommend no follow-up imaging. Lung bases are otherwise clear. The heart size is normal. No significant pleural or pericardial effusion is present. Hepatobiliary: Diffuse fatty infiltration liver is present. A stable 12 mm cyst is present in the left lobe. No other focal lesions are present. The common bile duct and gallbladder are within normal limits. Pancreas: Unremarkable. No pancreatic ductal dilatation or surrounding inflammatory changes. Spleen: Normal in size without focal abnormality. Adrenals/Urinary Tract: Adrenal  glands are normal bilaterally. An 18 mm simple cyst projecting posteriorly from the right kidney demonstrates slight interval growth. Recommend no follow-up imaging. Scattered parenchymal thinning likely represents scarring from remote infection, stable. No stone or obstruction is present. Ureters are within normal limits. The urinary bladder is mostly collapsed. Stomach/Bowel: The stomach and duodenum are within normal limits. Small bowel is unremarkable. Terminal ileum is within normal limits. The appendix is visualized and normal. The ascending and transverse colon are within normal limits. Diverticular changes are present within the descending and sigmoid colon. No significant focal inflammatory changes are present to suggest acute diverticulitis. Some wall thickening in the sigmoid colon is mostly due to lack of distension without associated inflammatory change. Vascular/Lymphatic: Atherosclerotic calcifications are present in the aorta and branch vessels without aneurysm. No significant retroperitoneal adenopathy is present. Reproductive: Prostate gland and seminal vesicles are within normal limits. Other: No abdominal wall hernia or abnormality. No abdominopelvic ascites. Musculoskeletal: Degenerative changes are present lower lumbar spine with leftward curvature centered at L3-4. Asymmetric right-sided disease is present at L2-3, L3-4 and L4-5. Asymmetric left-sided disease is present at L5-S1. Bone island in the left S2 segment is stable over time. No other focal osseous lesions are present. The hips are located and within normal limits bilaterally. IMPRESSION: 1. No acute or focal lesion to explain the patient's symptoms. 2. Normal pancreas. 3. Normal appendix. 4. Descending and sigmoid diverticulosis without diverticulitis. 5. Hepatic steatosis. 6. Multilevel degenerative changes in the lower lumbar spine. 7.  Aortic Atherosclerosis (ICD10-I70.0). Electronically Signed   By: Marin Roberts M.D.    On: 12/18/2022 13:26    Assessment & Plan:   Essential hypertension- His blood pressure is well-controlled. -     Basic metabolic panel; Future  Type II diabetes mellitus with manifestations (HCC)- His blood sugar is adequately well-controlled. -     Hemoglobin A1c; Future -  Basic metabolic panel; Future  Non-restorative sleep- I think this is caused by Benadryl and have asked him to stop taking Tylenol PM.  He tells me he does not want to try another sleep aid.  BPH associated with nocturia- His PSA is normal. -     PSA; Future  Encounter for general adult medical examination with abnormal findings- Exam completed, labs reviewed, vaccines are up-to-date, cancer screenings are up-to-date, patient education was given.     Follow-up: Return in about 6 months (around 10/01/2023).  Sanda Linger, MD

## 2023-04-08 ENCOUNTER — Other Ambulatory Visit: Payer: Self-pay | Admitting: Internal Medicine

## 2023-04-08 ENCOUNTER — Encounter: Payer: Self-pay | Admitting: Internal Medicine

## 2023-04-08 DIAGNOSIS — F5104 Psychophysiologic insomnia: Secondary | ICD-10-CM | POA: Insufficient documentation

## 2023-04-08 DIAGNOSIS — I10 Essential (primary) hypertension: Secondary | ICD-10-CM

## 2023-04-08 MED ORDER — ESZOPICLONE 2 MG PO TABS: 2.0000 mg | ORAL_TABLET | Freq: Every evening | ORAL | 0 refills | Status: DC | PRN

## 2023-04-08 MED ORDER — OLMESARTAN MEDOXOMIL 40 MG PO TABS: ORAL_TABLET | ORAL | 0 refills | Status: DC

## 2023-05-10 ENCOUNTER — Encounter: Payer: Self-pay | Admitting: Internal Medicine

## 2023-05-10 DIAGNOSIS — I2510 Atherosclerotic heart disease of native coronary artery without angina pectoris: Secondary | ICD-10-CM

## 2023-05-11 ENCOUNTER — Encounter: Payer: Self-pay | Admitting: Internal Medicine

## 2023-05-11 MED ORDER — METOPROLOL TARTRATE 25 MG PO TABS: 37.5000 mg | ORAL_TABLET | Freq: Two times a day (BID) | ORAL | 1 refills | Status: DC

## 2023-05-25 ENCOUNTER — Other Ambulatory Visit: Payer: Self-pay | Admitting: Internal Medicine

## 2023-05-25 ENCOUNTER — Encounter: Payer: Self-pay | Admitting: Internal Medicine

## 2023-05-25 DIAGNOSIS — N401 Enlarged prostate with lower urinary tract symptoms: Secondary | ICD-10-CM

## 2023-05-25 DIAGNOSIS — I10 Essential (primary) hypertension: Secondary | ICD-10-CM

## 2023-05-25 DIAGNOSIS — I2510 Atherosclerotic heart disease of native coronary artery without angina pectoris: Secondary | ICD-10-CM

## 2023-05-25 DIAGNOSIS — F331 Major depressive disorder, recurrent, moderate: Secondary | ICD-10-CM

## 2023-05-25 MED ORDER — LAMOTRIGINE 25 MG PO TABS: 25.0000 mg | ORAL_TABLET | Freq: Two times a day (BID) | ORAL | 0 refills | Status: DC

## 2023-05-25 MED ORDER — ALFUZOSIN HCL ER 10 MG PO TB24: 10.0000 mg | ORAL_TABLET | Freq: Every day | ORAL | 0 refills | Status: DC

## 2023-05-25 MED ORDER — OLMESARTAN MEDOXOMIL 40 MG PO TABS: ORAL_TABLET | ORAL | 0 refills | Status: DC

## 2023-05-25 MED ORDER — NITROGLYCERIN 0.4 MG SL SUBL: 0.4000 mg | SUBLINGUAL_TABLET | SUBLINGUAL | 3 refills | Status: DC | PRN

## 2023-06-05 ENCOUNTER — Encounter: Payer: Self-pay | Admitting: Internal Medicine

## 2023-06-09 ENCOUNTER — Encounter: Payer: Self-pay | Admitting: Internal Medicine

## 2023-06-09 ENCOUNTER — Ambulatory Visit: Payer: PPO | Admitting: Internal Medicine

## 2023-06-09 VITALS — BP 118/72 | HR 74 | Temp 98.1°F | Resp 16 | Ht 64.0 in | Wt 162.0 lb

## 2023-06-09 DIAGNOSIS — B351 Tinea unguium: Secondary | ICD-10-CM

## 2023-06-09 DIAGNOSIS — I10 Essential (primary) hypertension: Secondary | ICD-10-CM

## 2023-06-09 DIAGNOSIS — K7581 Nonalcoholic steatohepatitis (NASH): Secondary | ICD-10-CM | POA: Diagnosis not present

## 2023-06-09 DIAGNOSIS — E538 Deficiency of other specified B group vitamins: Secondary | ICD-10-CM | POA: Diagnosis not present

## 2023-06-09 DIAGNOSIS — L989 Disorder of the skin and subcutaneous tissue, unspecified: Secondary | ICD-10-CM | POA: Insufficient documentation

## 2023-06-09 DIAGNOSIS — I5189 Other ill-defined heart diseases: Secondary | ICD-10-CM

## 2023-06-09 LAB — CBC WITH DIFFERENTIAL/PLATELET
Basophils Absolute: 0 10*3/uL (ref 0.0–0.1)
Basophils Relative: 0.4 % (ref 0.0–3.0)
Eosinophils Absolute: 0.1 10*3/uL (ref 0.0–0.7)
Eosinophils Relative: 1.2 % (ref 0.0–5.0)
HCT: 44 % (ref 39.0–52.0)
Hemoglobin: 14.6 g/dL (ref 13.0–17.0)
Lymphocytes Relative: 34 % (ref 12.0–46.0)
Lymphs Abs: 2.7 10*3/uL (ref 0.7–4.0)
MCHC: 33.3 g/dL (ref 30.0–36.0)
MCV: 88.3 fl (ref 78.0–100.0)
Monocytes Absolute: 0.9 10*3/uL (ref 0.1–1.0)
Monocytes Relative: 11 % (ref 3.0–12.0)
Neutro Abs: 4.3 10*3/uL (ref 1.4–7.7)
Neutrophils Relative %: 53.4 % (ref 43.0–77.0)
Platelets: 231 10*3/uL (ref 150.0–400.0)
RBC: 4.98 Mil/uL (ref 4.22–5.81)
RDW: 13.8 % (ref 11.5–15.5)
WBC: 8 10*3/uL (ref 4.0–10.5)

## 2023-06-09 LAB — HEPATIC FUNCTION PANEL
ALT: 38 U/L (ref 0–53)
AST: 27 U/L (ref 0–37)
Albumin: 4.4 g/dL (ref 3.5–5.2)
Alkaline Phosphatase: 71 U/L (ref 39–117)
Bilirubin, Direct: 0.1 mg/dL (ref 0.0–0.3)
Total Bilirubin: 0.5 mg/dL (ref 0.2–1.2)
Total Protein: 7.6 g/dL (ref 6.0–8.3)

## 2023-06-09 MED ORDER — TERBINAFINE HCL 250 MG PO TABS: 250.0000 mg | ORAL_TABLET | Freq: Every day | ORAL | 0 refills | Status: DC

## 2023-06-09 NOTE — Patient Instructions (Signed)

## 2023-06-09 NOTE — Progress Notes (Unsigned)
Subjective:  Patient ID: Robert Mccall, male    DOB: 09-Dec-1950  Age: 72 y.o. MRN: [id number]  CC: Hypertension, Coronary Artery Disease, and Rash   HPI Robert Mccall presents for f/up ----  Discussed the use of AI scribe software for clinical note transcription with the patient, who gave verbal consent to proceed.  History of Present Illness   The patient presents with three primary concerns. Firstly, they report a persistent facial rash, particularly one spot that is frequently itchy and irritable. This rash has been present for an extended period. The patient expresses concern about potential skin cancer due to a family history of the condition. They have not applied any treatments to the area.  Secondly, the patient describes an issue with their toe, which began over a year ago after dropping a small desk drawer on it.   Lastly, the patient experiences frequent nocturnal cramping in their left foot and calf. This cramping does not occur during the day or during physical activity, but is severe enough to disrupt sleep. The patient has found some relief by wearing a heavy wool sock on the affected foot at night.       Outpatient Medications Prior to Visit  Medication Sig Dispense Refill   alfuzosin (UROXATRAL) 10 MG 24 hr tablet Take 1 tablet (10 mg total) by mouth daily with breakfast. 90 tablet 0   aspirin EC 81 MG tablet Take 1 tablet (81 mg total) by mouth daily. Swallow whole. 90 tablet 3   Cholecalciferol (VITAMIN D) 50 MCG (2000 UT) CAPS Take 1 capsule (2,000 Units total) by mouth daily. 30 capsule 11   clopidogrel (PLAVIX) 75 MG tablet Take 1 tablet (75 mg total) by mouth daily. 90 tablet 3   Docusate Sodium 100 MG capsule Take 100 mg by mouth 2 (two) times daily.     eszopiclone (LUNESTA) 2 MG TABS tablet Take 1 tablet (2 mg total) by mouth at bedtime as needed for sleep. Take immediately before bedtime 90 tablet 0   famotidine (PEPCID) 20 MG tablet  Take 1 tablet (20 mg total) by mouth daily. One after supper 90 tablet 1   finasteride (PROSCAR) 5 MG tablet Take 1 tablet (5 mg total) by mouth daily. 90 tablet 0   lamoTRIgine (LAMICTAL) 25 MG tablet Take 1 tablet (25 mg total) by mouth 2 (two) times daily. 180 tablet 0   latanoprost (XALATAN) 0.005 % ophthalmic solution Place 1 drop into both eyes at bedtime.     methylphenidate (METADATE ER) 20 MG ER tablet Take 1 tablet (20 mg total) by mouth daily. 90 tablet 0   metoprolol tartrate (LOPRESSOR) 25 MG tablet Take 1.5 tablets (37.5 mg total) by mouth 2 (two) times daily. 270 tablet 1   Multiple Vitamins-Minerals (ICAPS AREDS 2 PO) Take by mouth.     nitroGLYCERIN (NITROSTAT) 0.4 MG SL tablet Place 1 tablet (0.4 mg total) under the tongue every 5 (five) minutes as needed for chest pain (Call 911 if 3 doses does not relieve pain.). 25 tablet 3   olmesartan (BENICAR) 40 MG tablet TAKE 1 TABLET(40 MG) BY MOUTH DAILY 90 tablet 0   pantoprazole (PROTONIX) 40 MG tablet Take 1 tablet (40 mg total) by mouth daily. Take 30-60 min before first meal of the day 90 tablet 0   Probiotic Product (PROBIOTIC-10 PO) Take 1 capsule by mouth daily.     rosuvastatin (CRESTOR) 20 MG tablet Take 1 tablet (  20 mg total) by mouth daily. 90 tablet 1   Taurine 1000 MG CAPS Take 1 capsule by mouth daily in the afternoon.     timolol (TIMOPTIC) 0.5 % ophthalmic solution INT 1 GTT IN EACH EYE QAM  0   vitamin B-12 (CYANOCOBALAMIN) 1000 MCG tablet Take 1,000 mcg by mouth daily.     Zinc 50 MG TABS Take by mouth.     No facility-administered medications prior to visit.    ROS Review of Systems  Objective:  BP 118/72 (BP Location: Right Arm, Patient Position: Sitting, Cuff Size: Large)   Pulse 74   Temp 98.1 F (36.7 C) (Oral)   Resp 16   Ht 5\' 4"  (1.626 m)   Wt 162 lb (73.5 kg)   SpO2 95%   BMI 27.81 kg/m   BP Readings from Last 3 Encounters:  06/09/23 118/72  04/01/23 130/76  01/13/23 122/76    Wt  Readings from Last 3 Encounters:  06/09/23 162 lb (73.5 kg)  04/01/23 164 lb (74.4 kg)  01/13/23 168 lb 3.2 oz (76.3 kg)    Physical Exam  Lab Results  Component Value Date   WBC 8.0 06/09/2023   HGB 14.6 06/09/2023   HCT 44.0 06/09/2023   PLT 231.0 06/09/2023   GLUCOSE 98 04/01/2023   CHOL 122 12/17/2022   TRIG 191.0 (H) 12/17/2022   HDL 38.40 (L) 12/17/2022   LDLDIRECT 76.0 11/29/2019   LDLCALC 45 12/17/2022   ALT 38 06/09/2023   AST 27 06/09/2023   NA 138 04/01/2023   K 4.4 04/01/2023   CL 102 04/01/2023   CREATININE 1.12 04/01/2023   BUN 19 04/01/2023   CO2 29 04/01/2023   TSH 2.37 12/17/2022   PSA 0.35 04/01/2023   INR 1.04 01/23/2017   HGBA1C 5.9 04/01/2023   MICROALBUR 4.4 (H) 12/17/2022    CT Abdomen Pelvis W Contrast  Result Date: 12/18/2022 CLINICAL DATA:  Right lower quadrant abdominal pain 4 3 months. Elevated lipase EXAM: CT ABDOMEN AND PELVIS WITH CONTRAST TECHNIQUE: Multidetector CT imaging of the abdomen and pelvis was performed using the standard protocol following bolus administration of intravenous contrast. RADIATION DOSE REDUCTION: This exam was performed according to the departmental dose-optimization program which includes automated exposure control, adjustment of the mA and/or kV according to patient size and/or use of iterative reconstruction technique. CONTRAST:  ISOVUE-300 IOPAMIDOL (ISOVUE-300) INJECTION 61% COMPARISON:  Acute abdominal series 12/17/2022. CT of the abdomen and pelvis without and with contrast 04/02/2014 FINDINGS: Lower chest: 3 mm nodule in the right lower lobe on image 8 of series 4 is stable overall most 9 years. Recommend no follow-up imaging. Lung bases are otherwise clear. The heart size is normal. No significant pleural or pericardial effusion is present. Hepatobiliary: Diffuse fatty infiltration liver is present. A stable 12 mm cyst is present in the left lobe. No other focal lesions are present. The common bile duct and  gallbladder are within normal limits. Pancreas: Unremarkable. No pancreatic ductal dilatation or surrounding inflammatory changes. Spleen: Normal in size without focal abnormality. Adrenals/Urinary Tract: Adrenal glands are normal bilaterally. An 18 mm simple cyst projecting posteriorly from the right kidney demonstrates slight interval growth. Recommend no follow-up imaging. Scattered parenchymal thinning likely represents scarring from remote infection, stable. No stone or obstruction is present. Ureters are within normal limits. The urinary bladder is mostly collapsed. Stomach/Bowel: The stomach and duodenum are within normal limits. Small bowel is unremarkable. Terminal ileum is within normal limits. The appendix is  visualized and normal. The ascending and transverse colon are within normal limits. Diverticular changes are present within the descending and sigmoid colon. No significant focal inflammatory changes are present to suggest acute diverticulitis. Some wall thickening in the sigmoid colon is mostly due to lack of distension without associated inflammatory change. Vascular/Lymphatic: Atherosclerotic calcifications are present in the aorta and branch vessels without aneurysm. No significant retroperitoneal adenopathy is present. Reproductive: Prostate gland and seminal vesicles are within normal limits. Other: No abdominal wall hernia or abnormality. No abdominopelvic ascites. Musculoskeletal: Degenerative changes are present lower lumbar spine with leftward curvature centered at L3-4. Asymmetric right-sided disease is present at L2-3, L3-4 and L4-5. Asymmetric left-sided disease is present at L5-S1. Bone island in the left S2 segment is stable over time. No other focal osseous lesions are present. The hips are located and within normal limits bilaterally. IMPRESSION: 1. No acute or focal lesion to explain the patient's symptoms. 2. Normal pancreas. 3. Normal appendix. 4. Descending and sigmoid  diverticulosis without diverticulitis. 5. Hepatic steatosis. 6. Multilevel degenerative changes in the lower lumbar spine. 7.  Aortic Atherosclerosis (ICD10-I70.0). Electronically Signed   By: Marin Roberts M.D.   On: 12/18/2022 13:26    Assessment & Plan:   Onychomycosis of great toe -     Hepatic function panel; Future -     Terbinafine HCl; Take 1 tablet (250 mg total) by mouth daily.  Dispense: 90 tablet; Refill: 0  B12 deficiency -     CBC with Differential/Platelet; Future  NASH (nonalcoholic steatohepatitis) -     Hepatic function panel; Future  Lesion of skin of face -     Ambulatory referral to Dermatology     Follow-up: Return in about 3 months (around 09/09/2023).  Sanda Linger, MD

## 2023-06-22 ENCOUNTER — Encounter: Payer: Self-pay | Admitting: Internal Medicine

## 2023-06-23 ENCOUNTER — Other Ambulatory Visit: Payer: Self-pay | Admitting: Internal Medicine

## 2023-06-23 DIAGNOSIS — N401 Enlarged prostate with lower urinary tract symptoms: Secondary | ICD-10-CM

## 2023-06-23 MED ORDER — FINASTERIDE 5 MG PO TABS: 5.0000 mg | ORAL_TABLET | Freq: Every day | ORAL | 0 refills | Status: DC

## 2023-06-29 DIAGNOSIS — H5201 Hypermetropia, right eye: Secondary | ICD-10-CM | POA: Diagnosis not present

## 2023-06-29 DIAGNOSIS — H2511 Age-related nuclear cataract, right eye: Secondary | ICD-10-CM | POA: Diagnosis not present

## 2023-06-29 DIAGNOSIS — H401131 Primary open-angle glaucoma, bilateral, mild stage: Secondary | ICD-10-CM | POA: Diagnosis not present

## 2023-06-29 DIAGNOSIS — H43813 Vitreous degeneration, bilateral: Secondary | ICD-10-CM | POA: Diagnosis not present

## 2023-06-29 DIAGNOSIS — H2512 Age-related nuclear cataract, left eye: Secondary | ICD-10-CM | POA: Diagnosis not present

## 2023-07-02 ENCOUNTER — Encounter: Payer: Self-pay | Admitting: Cardiovascular Disease

## 2023-07-02 ENCOUNTER — Encounter: Payer: Self-pay | Admitting: Internal Medicine

## 2023-07-02 ENCOUNTER — Other Ambulatory Visit (HOSPITAL_COMMUNITY): Payer: Self-pay

## 2023-07-02 MED ORDER — CLOPIDOGREL BISULFATE 75 MG PO TABS
75.0000 mg | ORAL_TABLET | Freq: Every day | ORAL | 2 refills | Status: DC
  Filled 2023-07-02: qty 90, 90d supply, fill #0
  Filled 2023-09-25: qty 90, 90d supply, fill #1
  Filled 2023-12-22: qty 90, 90d supply, fill #2

## 2023-07-08 ENCOUNTER — Other Ambulatory Visit: Payer: Self-pay | Admitting: Internal Medicine

## 2023-07-08 ENCOUNTER — Other Ambulatory Visit (HOSPITAL_COMMUNITY): Payer: Self-pay

## 2023-07-08 ENCOUNTER — Encounter: Payer: Self-pay | Admitting: Internal Medicine

## 2023-07-08 DIAGNOSIS — I2510 Atherosclerotic heart disease of native coronary artery without angina pectoris: Secondary | ICD-10-CM

## 2023-07-08 DIAGNOSIS — I10 Essential (primary) hypertension: Secondary | ICD-10-CM

## 2023-07-08 DIAGNOSIS — K219 Gastro-esophageal reflux disease without esophagitis: Secondary | ICD-10-CM

## 2023-07-08 DIAGNOSIS — N401 Enlarged prostate with lower urinary tract symptoms: Secondary | ICD-10-CM

## 2023-07-08 DIAGNOSIS — E785 Hyperlipidemia, unspecified: Secondary | ICD-10-CM

## 2023-07-08 DIAGNOSIS — F331 Major depressive disorder, recurrent, moderate: Secondary | ICD-10-CM

## 2023-07-08 DIAGNOSIS — I25119 Atherosclerotic heart disease of native coronary artery with unspecified angina pectoris: Secondary | ICD-10-CM

## 2023-07-08 MED ORDER — PANTOPRAZOLE SODIUM 40 MG PO TBEC
40.0000 mg | DELAYED_RELEASE_TABLET | Freq: Every day | ORAL | 0 refills | Status: DC
  Filled 2023-07-08: qty 90, 90d supply, fill #0

## 2023-07-08 MED ORDER — FINASTERIDE 5 MG PO TABS
5.0000 mg | ORAL_TABLET | Freq: Every day | ORAL | 0 refills | Status: DC
  Filled 2023-07-08 – 2023-09-25 (×5): qty 90, 90d supply, fill #0

## 2023-07-08 MED ORDER — ALFUZOSIN HCL ER 10 MG PO TB24
10.0000 mg | ORAL_TABLET | Freq: Every day | ORAL | 0 refills | Status: DC
  Filled 2023-07-08 – 2023-08-05 (×4): qty 90, 90d supply, fill #0

## 2023-07-08 MED ORDER — FAMOTIDINE 20 MG PO TABS
20.0000 mg | ORAL_TABLET | Freq: Every day | ORAL | 0 refills | Status: DC
  Filled 2023-07-08 – 2023-09-25 (×5): qty 90, 90d supply, fill #0

## 2023-07-08 MED ORDER — OLMESARTAN MEDOXOMIL 40 MG PO TABS
ORAL_TABLET | ORAL | 0 refills | Status: DC
  Filled 2023-07-08: qty 90, 90d supply, fill #0

## 2023-07-08 MED ORDER — LAMOTRIGINE 25 MG PO TABS
25.0000 mg | ORAL_TABLET | Freq: Two times a day (BID) | ORAL | 0 refills | Status: DC
  Filled 2023-07-08 – 2023-09-25 (×4): qty 180, 90d supply, fill #0

## 2023-07-08 MED ORDER — NITROGLYCERIN 0.4 MG SL SUBL
0.4000 mg | SUBLINGUAL_TABLET | SUBLINGUAL | 3 refills | Status: DC | PRN
  Filled 2023-07-08: qty 25, 1d supply, fill #0
  Filled 2023-08-16: qty 25, 1d supply, fill #1

## 2023-07-08 MED ORDER — ROSUVASTATIN CALCIUM 20 MG PO TABS
20.0000 mg | ORAL_TABLET | Freq: Every day | ORAL | 0 refills | Status: DC
  Filled 2023-07-08: qty 90, 90d supply, fill #0

## 2023-07-08 MED ORDER — METOPROLOL TARTRATE 25 MG PO TABS
37.5000 mg | ORAL_TABLET | Freq: Two times a day (BID) | ORAL | 0 refills | Status: DC
  Filled 2023-07-08 – 2023-08-05 (×4): qty 270, 90d supply, fill #0

## 2023-07-09 ENCOUNTER — Other Ambulatory Visit (HOSPITAL_COMMUNITY): Payer: Self-pay

## 2023-07-09 ENCOUNTER — Other Ambulatory Visit: Payer: Self-pay | Admitting: Internal Medicine

## 2023-07-09 DIAGNOSIS — F900 Attention-deficit hyperactivity disorder, predominantly inattentive type: Secondary | ICD-10-CM

## 2023-07-09 MED ORDER — METHYLPHENIDATE HCL ER 20 MG PO TBCR: 20.0000 mg | EXTENDED_RELEASE_TABLET | Freq: Every day | ORAL | 0 refills | Status: DC

## 2023-07-16 ENCOUNTER — Encounter: Payer: Self-pay | Admitting: Internal Medicine

## 2023-07-16 ENCOUNTER — Other Ambulatory Visit: Payer: Self-pay | Admitting: Internal Medicine

## 2023-07-16 DIAGNOSIS — F5104 Psychophysiologic insomnia: Secondary | ICD-10-CM

## 2023-07-16 MED ORDER — ESZOPICLONE 2 MG PO TABS: 2.0000 mg | ORAL_TABLET | Freq: Every evening | ORAL | 0 refills | Status: DC | PRN

## 2023-07-22 ENCOUNTER — Ambulatory Visit: Payer: PPO | Admitting: Dermatology

## 2023-07-22 ENCOUNTER — Encounter: Payer: Self-pay | Admitting: Dermatology

## 2023-07-22 VITALS — BP 133/89 | HR 63

## 2023-07-22 DIAGNOSIS — Z808 Family history of malignant neoplasm of other organs or systems: Secondary | ICD-10-CM | POA: Diagnosis not present

## 2023-07-22 DIAGNOSIS — L219 Seborrheic dermatitis, unspecified: Secondary | ICD-10-CM | POA: Diagnosis not present

## 2023-07-22 DIAGNOSIS — L570 Actinic keratosis: Secondary | ICD-10-CM

## 2023-07-22 MED ORDER — KETOCONAZOLE 2 % EX SHAM
1.0000 | MEDICATED_SHAMPOO | CUTANEOUS | 6 refills | Status: AC
Start: 2023-07-22 — End: ?

## 2023-07-22 MED ORDER — KETOCONAZOLE 2 % EX CREA
1.0000 | TOPICAL_CREAM | Freq: Two times a day (BID) | CUTANEOUS | 5 refills | Status: AC
Start: 2023-07-22 — End: 2024-01-18

## 2023-07-22 NOTE — Patient Instructions (Addendum)
Hello Robert Mccall,  Thank you for visiting my office today. Your proactive approach towards your skin health is greatly appreciated. Below is a summary of our treatment plan and the instructions provided during your visit:  - Actinic Keratosis Treatment:   - Procedure: Treated the actinic keratosis spots with liquid nitrogen to prevent potential progression to skin cancer.   - Aftercare: Apply Vaseline to the treated areas every morning and night to aid in healing.   - Healing: The crusts should fall off in about two to three weeks, revealing normal skin underneath.   - Follow-Up: We will reassess these spots in six months, as a second treatment may be necessary.  - Seborrheic Dermatitis Management:   - Shampoo: Use prescription Nizoral shampoo twice a week. Apply, let it sit, then rinse.   - Cream: Apply ketoconazole cream to affected skin areas twice a day during active flares until symptoms resolve.   - Prescriptions: These treatments are provided with several refills to manage cyclical flare-ups.  - Follow-Up Appointment:   - Schedule: A follow-up visit in six months is scheduled to monitor the effectiveness of the treatments and reassess the actinic keratosis.  Your prescriptions have been arranged to be sent to your pharmacy. Please ensure to follow the treatment regimen as discussed. Do not hesitate to contact my office if you have any questions or concerns in the meantime.  Warm regards,  Dr. Langston Reusing,  Dermatologist     Important Information  Due to recent changes in healthcare laws, you may see results of your pathology and/or laboratory studies on MyChart before the doctors have had a chance to review them. We understand that in some cases there may be results that are confusing or concerning to you. Please understand that not all results are received at the same time and often the doctors may need to interpret multiple results in order to provide you with the best plan of  care or course of treatment. Therefore, we ask that you please give [place] 2 business days to thoroughly review all your results before contacting the office for clarification. Should we see a critical lab result, you will be contacted sooner.   If You Need Anything After Your Visit  If you have any questions or concerns for your doctor, please call our main line at [phone] If no one answers, please leave a voicemail as directed and we will return your call as soon as possible. Messages left after 4 pm will be answered the following business day.   You may also send [place] a message via MyChart. We typically respond to MyChart messages within 1-2 business days.  For prescription refills, please ask your pharmacy to contact our office. Our fax number is [phone].  If you have an urgent issue when the clinic is closed that cannot wait until the next business day, you can page your doctor at the number below.    Please note that while we do our best to be available for urgent issues outside of office hours, we are not available 24/7.   If you have an urgent issue and are unable to reach [place], you may choose to seek medical care at your doctor's office, retail clinic, urgent care center, or emergency room.  If you have a medical emergency, please immediately call 911 or go to the emergency department. In the event of inclement weather, please call our main line at [phone] for an update on the status of any delays or closures.  Dermatology Medication Tips: Please keep the boxes that topical medications come in in order to help keep track of the instructions about where and how to use these. Pharmacies typically print the medication instructions only on the boxes and not directly on the medication tubes.   If your medication is too expensive, please contact our office at [phone] or send [place] a message through MyChart.   We are unable to tell what your co-pay for medications will be in  advance as this is different depending on your insurance coverage. However, we may be able to find a substitute medication at lower cost or fill out paperwork to get insurance to cover a needed medication.   If a prior authorization is required to get your medication covered by your insurance company, please allow [place] 1-2 business days to complete this process.  Drug prices often vary depending on where the prescription is filled and some pharmacies may offer cheaper prices.  The website www.goodrx.com contains coupons for medications through different pharmacies. The prices here do not account for what the cost may be with help from insurance (it may be cheaper with your insurance), but the website can give you the price if you did not use any insurance.  - You can print the associated coupon and take it with your prescription to the pharmacy.  - You may also stop by our office during regular business hours and pick up a GoodRx coupon card.  - If you need your prescription sent electronically to a different pharmacy, notify our office through [facility] or by phone at [phone]

## 2023-07-22 NOTE — Progress Notes (Signed)
New Patient Visit   Subjective  Robert Mccall is a 72 y.o. male who presents for the following: Skin Lesion  Patient states he  has spot located at the face that he  would like to have examined. Patient reports the areas have been there for  Several  year(s). He reports the areas are bothersome. He states that the areas have not spread. Patient reports has not previously been treated for these areas. Patient denies Hx of bx. Patient reports family history of skin cancer(s)(DAD). He reports he has moderate sun exposure throughout his life time. Currently if he is exposed to excessive sun he doesn't wear sunscreen or protective coverings.  The following portions of the chart were reviewed this encounter and updated as appropriate: medications, allergies, medical history  Review of Systems:  No other skin or systemic complaints except as noted in HPI or Assessment and Plan.  Objective  Well appearing patient in no apparent distress; mood and affect are within normal limits.  A focused examination was performed of the following areas: Face  Relevant exam findings are noted in the Assessment and Plan.  Left Malar Cheek, Right Malar Cheek Erythematous thin papules/macules with gritty scale          Assessment & Plan   Actinic keratosis (2) Left Malar Cheek; Right Malar Cheek  Destruction of lesion - Left Malar Cheek, Right Malar Cheek Complexity: simple   Destruction method: cryotherapy   Informed consent: discussed and consent obtained   Timeout:  patient name, date of birth, surgical site, and procedure verified Lesion destroyed using liquid nitrogen: Yes   Cryotherapy cycles:  2 Post-procedure details: wound care instructions given    Seborrheic dermatitis  Related Medications ketoconazole (NIZORAL) 2 % shampoo Apply 1 Application topically 2 (two) times a week. Allow to sit on Scalp for about 30 seconds to 1 minute  ketoconazole (NIZORAL) 2 % cream Apply 1  Application topically 2 (two) times daily.  SEBORRHEIC DERMATITIS Exam: Pink patches with greasy scale at forehead  Flared  Seborrheic Dermatitis is a chronic persistent rash characterized by pinkness and scaling most commonly of the mid face but also can occur on the scalp (dandruff), ears; mid chest, mid back and groin.  It tends to be exacerbated by stress and cooler weather.  People who have neurologic disease may experience new onset or exacerbation of existing seborrheic dermatitis.  The condition is not curable but treatable and can be controlled.  Treatment Plan: - We will prescribe Ketoconazole Shampoo, Apply to wet scalp, allow to sit for 30 seconds to 1 minute.  - We will also prescribe Ketoconazole Cream to apply to face when flared - We will reassess in 6 months   Actinic keratosis (2) Left Malar Cheek; Right Malar Cheek  Destruction of lesion - Left Malar Cheek, Right Malar Cheek Complexity: simple   Destruction method: cryotherapy   Informed consent: discussed and consent obtained   Timeout:  patient name, date of birth, surgical site, and procedure verified Lesion destroyed using liquid nitrogen: Yes   Cryotherapy cycles:  2 Post-procedure details: wound care instructions given    Seborrheic dermatitis  Related Medications ketoconazole (NIZORAL) 2 % shampoo Apply 1 Application topically 2 (two) times a week. Allow to sit on Scalp for about 30 seconds to 1 minute  ketoconazole (NIZORAL) 2 % cream Apply 1 Application topically 2 (two) times daily.   Return in about 6 months (around 01/22/2024) for AK F/U.  Documentation: I have reviewed  the above documentation for accuracy and completeness, and I agree with the above.  Stasia Cavalier, am acting as scribe for Langston Reusing, DO.  Langston Reusing, DO

## 2023-08-06 ENCOUNTER — Other Ambulatory Visit (HOSPITAL_COMMUNITY): Payer: Self-pay

## 2023-08-06 ENCOUNTER — Other Ambulatory Visit: Payer: Self-pay

## 2023-08-13 ENCOUNTER — Other Ambulatory Visit: Payer: Self-pay | Admitting: Medical Genetics

## 2023-08-13 DIAGNOSIS — Z006 Encounter for examination for normal comparison and control in clinical research program: Secondary | ICD-10-CM

## 2023-08-19 ENCOUNTER — Encounter: Payer: Self-pay | Admitting: Internal Medicine

## 2023-08-30 ENCOUNTER — Encounter: Payer: Self-pay | Admitting: Internal Medicine

## 2023-09-10 ENCOUNTER — Inpatient Hospital Stay: Payer: PPO | Attending: Hematology and Oncology

## 2023-09-10 DIAGNOSIS — Z7902 Long term (current) use of antithrombotics/antiplatelets: Secondary | ICD-10-CM | POA: Insufficient documentation

## 2023-09-10 DIAGNOSIS — Z7982 Long term (current) use of aspirin: Secondary | ICD-10-CM | POA: Diagnosis not present

## 2023-09-10 DIAGNOSIS — D472 Monoclonal gammopathy: Secondary | ICD-10-CM | POA: Insufficient documentation

## 2023-09-10 DIAGNOSIS — Z79899 Other long term (current) drug therapy: Secondary | ICD-10-CM | POA: Insufficient documentation

## 2023-09-10 LAB — CBC WITH DIFFERENTIAL/PLATELET
Abs Immature Granulocytes: 0.01 10*3/uL (ref 0.00–0.07)
Basophils Absolute: 0 10*3/uL (ref 0.0–0.1)
Basophils Relative: 1 %
Eosinophils Absolute: 0.2 10*3/uL (ref 0.0–0.5)
Eosinophils Relative: 2 %
HCT: 44.1 % (ref 39.0–52.0)
Hemoglobin: 14.8 g/dL (ref 13.0–17.0)
Immature Granulocytes: 0 %
Lymphocytes Relative: 34 %
Lymphs Abs: 2.2 10*3/uL (ref 0.7–4.0)
MCH: 29.6 pg (ref 26.0–34.0)
MCHC: 33.6 g/dL (ref 30.0–36.0)
MCV: 88.2 fL (ref 80.0–100.0)
Monocytes Absolute: 0.8 10*3/uL (ref 0.1–1.0)
Monocytes Relative: 12 %
Neutro Abs: 3.4 10*3/uL (ref 1.7–7.7)
Neutrophils Relative %: 51 %
Platelets: 241 10*3/uL (ref 150–400)
RBC: 5 MIL/uL (ref 4.22–5.81)
RDW: 12.9 % (ref 11.5–15.5)
WBC: 6.6 10*3/uL (ref 4.0–10.5)
nRBC: 0 % (ref 0.0–0.2)

## 2023-09-10 LAB — COMPREHENSIVE METABOLIC PANEL
ALT: 24 U/L (ref 0–44)
AST: 27 U/L (ref 15–41)
Albumin: 4.4 g/dL (ref 3.5–5.0)
Alkaline Phosphatase: 64 U/L (ref 38–126)
Anion gap: 5 (ref 5–15)
BUN: 14 mg/dL (ref 8–23)
CO2: 27 mmol/L (ref 22–32)
Calcium: 9.5 mg/dL (ref 8.9–10.3)
Chloride: 104 mmol/L (ref 98–111)
Creatinine, Ser: 1.17 mg/dL (ref 0.61–1.24)
GFR, Estimated: >60 mL/min (ref >60)
Glucose, Bld: 111 mg/dL — ABNORMAL HIGH (ref 70–99)
Potassium: 4.4 mmol/L (ref 3.5–5.1)
Sodium: 136 mmol/L (ref 135–145)
Total Bilirubin: 0.6 mg/dL (ref 0.3–1.2)
Total Protein: 7.5 g/dL (ref 6.5–8.1)

## 2023-09-13 LAB — KAPPA/LAMBDA LIGHT CHAINS
Kappa free light chain: 18.1 mg/L (ref 3.3–19.4)
Kappa, lambda light chain ratio: 1.07 (ref 0.26–1.65)
Lambda free light chains: 16.9 mg/L (ref 5.7–26.3)

## 2023-09-14 LAB — MULTIPLE MYELOMA PANEL, SERUM
Albumin SerPl Elph-Mcnc: 3.8 g/dL (ref 2.9–4.4)
Albumin/Glob SerPl: 1.2 (ref 0.7–1.7)
Alpha 1: 0.2 g/dL (ref 0.0–0.4)
Alpha2 Glob SerPl Elph-Mcnc: 0.9 g/dL (ref 0.4–1.0)
B-Globulin SerPl Elph-Mcnc: 0.9 g/dL (ref 0.7–1.3)
Gamma Glob SerPl Elph-Mcnc: 1.2 g/dL (ref 0.4–1.8)
Globulin, Total: 3.2 g/dL (ref 2.2–3.9)
IgA: 154 mg/dL (ref 61–437)
IgG (Immunoglobin G), Serum: 1239 mg/dL (ref 603–1613)
IgM (Immunoglobulin M), Srm: 98 mg/dL (ref 15–143)
M Protein SerPl Elph-Mcnc: 0.6 g/dL — ABNORMAL HIGH
Total Protein ELP: 7 g/dL (ref 6.0–8.5)

## 2023-09-15 DIAGNOSIS — H43813 Vitreous degeneration, bilateral: Secondary | ICD-10-CM | POA: Diagnosis not present

## 2023-09-15 DIAGNOSIS — H401131 Primary open-angle glaucoma, bilateral, mild stage: Secondary | ICD-10-CM | POA: Diagnosis not present

## 2023-09-15 DIAGNOSIS — H43392 Other vitreous opacities, left eye: Secondary | ICD-10-CM | POA: Diagnosis not present

## 2023-09-15 DIAGNOSIS — H35372 Puckering of macula, left eye: Secondary | ICD-10-CM | POA: Diagnosis not present

## 2023-09-15 DIAGNOSIS — H43393 Other vitreous opacities, bilateral: Secondary | ICD-10-CM | POA: Diagnosis not present

## 2023-09-21 ENCOUNTER — Ambulatory Visit: Payer: PPO | Attending: Cardiology | Admitting: Cardiology

## 2023-09-21 ENCOUNTER — Encounter: Payer: Self-pay | Admitting: Hematology and Oncology

## 2023-09-21 ENCOUNTER — Inpatient Hospital Stay: Payer: PPO | Admitting: Hematology and Oncology

## 2023-09-21 ENCOUNTER — Encounter: Payer: Self-pay | Admitting: Cardiology

## 2023-09-21 VITALS — BP 130/70 | HR 55 | Ht 64.0 in | Wt 168.0 lb

## 2023-09-21 DIAGNOSIS — I5032 Chronic diastolic (congestive) heart failure: Secondary | ICD-10-CM | POA: Diagnosis not present

## 2023-09-21 DIAGNOSIS — D472 Monoclonal gammopathy: Secondary | ICD-10-CM

## 2023-09-21 DIAGNOSIS — I10 Essential (primary) hypertension: Secondary | ICD-10-CM

## 2023-09-21 DIAGNOSIS — I25119 Atherosclerotic heart disease of native coronary artery with unspecified angina pectoris: Secondary | ICD-10-CM | POA: Diagnosis not present

## 2023-09-21 NOTE — Patient Instructions (Signed)
Medication Instructions:   Your physician recommends that you continue on your current medications as directed. Please refer to the Current Medication list given to you today.   *If you need a refill on your cardiac medications before your next appointment, please call your pharmacy*   Lab Work:  None ordered.  If you have labs (blood work) drawn today and your tests are completely normal, you will receive your results only by: MyChart Message (if you have MyChart) OR A paper copy in the mail If you have any lab test that is abnormal or we need to change your treatment, we will call you to review the results.   Testing/Procedures:  None ordered.   Follow-Up: At [facility], you and your health needs are our priority.  As part of our continuing mission to provide you with exceptional heart care, we have created designated Provider Care Teams.  These Care Teams include your primary Cardiologist (physician) and Advanced Practice Providers (APPs -  Physician Assistants and Nurse Practitioners) who all work together to provide you with the care you need, when you need it.  We recommend signing up for the patient portal called "MyChart".  Sign up information is provided on this After Visit Summary.  MyChart is used to connect with patients for Virtual Visits (Telemedicine).  Patients are able to view lab/test results, encounter notes, upcoming appointments, etc.  Non-urgent messages can be sent to your provider as well.   To learn more about what you can do with MyChart, go to [email or web address].    Your next appointment:   1 year(s)  Provider:   Kristeen Miss, MD     Other Instructions  Your physician wants you to follow-up in: 1 year.  You will receive a reminder letter in the mail two months in advance. If you don't receive a letter, please call our office to schedule the follow-up appointment.

## 2023-09-21 NOTE — Progress Notes (Signed)
HEMATOLOGY-ONCOLOGY ELECTRONIC VISIT PROGRESS NOTE  Patient Care Team: [name], MD as PCP - General (Internal Medicine) Nahser, Deloris Ping, MD as PCP - Cardiology (Cardiology) Louis Meckel, MD (Inactive) (Gastroenterology) Gelene Mink, OD (Optometry) Jethro Bolus, MD (Inactive) as Consulting Physician (Urology) Drema Halon, MD (Inactive) (Otolaryngology) Kathyrn Sheriff, [facility] (Inactive) as Pharmacist (Pharmacist)  I connected with the patient via telephone conference and verified that I am speaking with the correct person using two identifiers. The patient's location is at home and I am providing care from the [facility] I discussed the limitations, risks, security and privacy concerns of performing an evaluation and management service by e-visits and the availability of in person appointments.  I also discussed with the patient that there may be a patient responsible charge related to this service. The patient expressed understanding and agreed to proceed.   ASSESSMENT & PLAN:  No problem-specific Assessment & Plan notes found for this encounter.   No orders of the defined types were placed in this encounter.   INTERVAL HISTORY: Please see below for problem oriented charting. The purpose of today's discussion is to review test results The patient is being followed for MGUS He is doing well He underwent recent cataract surgery No recent infection no new health issues  SUMMARY OF ONCOLOGIC HISTORY:  The patient was feeling unwell with sensation of dizziness. He was referred to neurologist for further evaluation. Incidentally, during his physical examination, he also noted history of reduced sensation in his feet and numbness at the end of toes.  The patient has history of chronic back pain and was treated with gabapentin for his back pain. His neurologist perform significant workup including blood work which showed IgG lambda MGUS. He is being  referred here for further evaluation He denies history of abnormal bone pain or bone fracture. Patient denies history of recurrent infection or atypical infections such as shingles of meningitis. Denies chills, night sweats, anorexia or abnormal weight loss. Repeat blood work and skeletal survey in September 2017 show no evidence of end organ damage  He was recommended observation He was also noted to have borderline B12 deficiency and was recommended oral vitamin B12 replacement therapy  REVIEW OF SYSTEMS:   Constitutional: Denies fevers, chills or abnormal weight loss Eyes: Denies blurriness of vision Ears, nose, mouth, throat, and face: Denies mucositis or sore throat Respiratory: Denies cough, dyspnea or wheezes Cardiovascular: Denies palpitation, chest discomfort Gastrointestinal:  Denies nausea, heartburn or change in bowel habits Skin: Denies abnormal skin rashes Lymphatics: Denies new lymphadenopathy or easy bruising Neurological:Denies numbness, tingling or new weaknesses Behavioral/Psych: Mood is stable, no new changes  Extremities: No lower extremity edema All other systems were reviewed with the patient and are negative.  I have reviewed the past medical history, past surgical history, social history and family history with the patient and they are unchanged from previous note.  ALLERGIES:  has No Known Allergies.  MEDICATIONS:  Current Outpatient Medications  Medication Sig Dispense Refill   alfuzosin (UROXATRAL) 10 MG 24 hr tablet Take 1 tablet (10 mg total) by mouth daily with breakfast. 90 tablet 0   aspirin EC 81 MG tablet Take 1 tablet (81 mg total) by mouth daily. Swallow whole. 90 tablet 3   Cholecalciferol (VITAMIN D) 50 MCG (2000 UT) CAPS Take 1 capsule (2,000 Units total) by mouth daily. 30 capsule 11   clopidogrel (PLAVIX) 75 MG tablet Take 1 tablet (75 mg) by mouth daily.-- Starting [employer]  90 tablet 2   Docusate Sodium 100 MG capsule Take  100 mg by mouth 2 (two) times daily.     eszopiclone (LUNESTA) 2 MG TABS tablet Take 1 tablet (2 mg total) by mouth at bedtime as needed for sleep. Take immediately before bedtime 90 tablet 0   famotidine (PEPCID) 20 MG tablet Take 1 tablet (20 mg total) by mouth daily after supper 90 tablet 0   finasteride (PROSCAR) 5 MG tablet Take 1 tablet (5 mg total) by mouth daily. 90 tablet 0   ketoconazole (NIZORAL) 2 % cream Apply 1 Application topically 2 (two) times daily. 60 g 5   ketoconazole (NIZORAL) 2 % shampoo Apply 1 Application topically 2 (two) times a week. Allow to sit on Scalp for about 30 seconds to 1 minute 120 mL 6   lamoTRIgine (LAMICTAL) 25 MG tablet Take 1 tablet (25 mg total) by mouth 2 (two) times daily. 180 tablet 0   latanoprost (XALATAN) 0.005 % ophthalmic solution Place 1 drop into both eyes at bedtime.     methylphenidate (METADATE ER) 20 MG ER tablet Take 1 tablet (20 mg total) by mouth daily. 90 tablet 0   metoprolol tartrate (LOPRESSOR) 25 MG tablet Take 1.5 tablets (37.5 mg total) by mouth 2 (two) times daily. 270 tablet 0   Multiple Vitamins-Minerals (ICAPS AREDS 2 PO) Take by mouth.     nitroGLYCERIN (NITROSTAT) 0.4 MG SL tablet Place 1 tablet (0.4 mg total) under the tongue every 5 (five) minutes as needed for chest pain (Call 911 if 3 doses does not relieve pain.). 25 tablet 3   olmesartan (BENICAR) 40 MG tablet TAKE 1 TABLET(40 MG) BY MOUTH DAILY 90 tablet 0   pantoprazole (PROTONIX) 40 MG tablet Take 1 tablet (40 mg total) by mouth daily. Take 30-60 min before first meal of the day 90 tablet 0   Probiotic Product (PROBIOTIC-10 PO) Take 1 capsule by mouth daily.     rosuvastatin (CRESTOR) 20 MG tablet Take 1 tablet (20 mg total) by mouth daily. 90 tablet 0   Taurine 1000 MG CAPS Take 1 capsule by mouth daily in the afternoon.     vitamin B-12 (CYANOCOBALAMIN) 1000 MCG tablet Take 1,000 mcg by mouth daily.     No current facility-administered medications for this visit.     PHYSICAL EXAMINATION: ECOG PERFORMANCE STATUS: 0 - Asymptomatic  LABORATORY DATA:  I have reviewed the data as listed    Latest Ref Rng & Units 09/10/2023    9:31 AM 06/09/2023    2:52 PM 04/01/2023   10:59 AM  CMP  Glucose 70 - 99 mg/dL 324   98   BUN 8 - 23 mg/dL 14   19   Creatinine 4.01 - 1.24 mg/dL 0.27   2.53   Sodium 664 - 145 mmol/L 136   138   Potassium 3.5 - 5.1 mmol/L 4.4   4.4   Chloride 98 - 111 mmol/L 104   102   CO2 22 - 32 mmol/L 27   29   Calcium 8.9 - 10.3 mg/dL 9.5   9.3   Total Protein 6.5 - 8.1 g/dL 7.5  7.6    Total Bilirubin 0.3 - 1.2 mg/dL 0.6  0.5    Alkaline Phos 38 - 126 U/L 64  71    AST 15 - 41 U/L 27  27    ALT 0 - 44 U/L 24  38      Lab Results  Component Value Date  WBC 6.6 09/10/2023   HGB 14.8 09/10/2023   HCT 44.1 09/10/2023   MCV 88.2 09/10/2023   PLT 241 09/10/2023   NEUTROABS 3.4 09/10/2023     I discussed the assessment and treatment plan with the patient. The patient was provided an opportunity to ask questions and all were answered. The patient agreed with the plan and demonstrated an understanding of the instructions. The patient was advised to call back or seek an in-person evaluation if the symptoms worsen or if the condition fails to improve as anticipated.    I spent 20 minutes for the appointment reviewing test results, discuss management and coordination of care.  Artis Delay, MD 09/21/2023 8:55 AM

## 2023-09-21 NOTE — Progress Notes (Signed)
Cardiology Office Note:   Date:  09/21/2023  ID:  Robert Mccall, DOB May 24, 1951, MRN [id number] PCP: [name], MD  [facility] Providers Cardiologist:  Kristeen Miss, MD    History of Present Illness:   Discussed the use of AI scribe software for clinical note transcription with the patient, who gave verbal consent to proceed.  History of Present Illness   The patient is a 72 year old individual with a history of transient ischemic attack, chronic diastolic congestive heart failure, hypertension, and hyperlipidemia. He has been under the regular care of Dr. Elease Hashimoto. In 2021, the patient underwent a coronary CT angiogram, which revealed moderate disease in the proximal to mid LAD, raising concerns for possible obstruction distal to the first diagonal at the level of the second diagonal. However, FFR did not indicate any significant obstructive disease. In February 2023, the patient underwent stenting of the proximal LAD and PTCA of diagonal.  Presents today for regular follow up. The patient reports that his blood pressure has been stable. Home blood pressure logs indicate an average of 120/73 over the past month.  The patient denies experiencing any chest pain or shortness of breath since the last office visit. He reports pain in the center of his back, which he attributes to previous back surgery. The patient clarifies that this pain differs from the symptoms experienced before his catheterization last year. 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 He denies any chest discomfort or shortness of breath while walking.  He has nitroglycerin as needed but has not required it.   The patient reports periodic cramps in his feet and lower leg, particularly at night. He admits to not being diligent about fluid intake. The patient denies any issues with swelling in the legs, shortness of breath at night,  dizziness, lightheadedness, and bloody urine or stools. He does, however, report occasional issues with balance. The patient also reports changes in bowel movements, which are less frequent and stable than before. He reports a healthy diet with limited saturated fats and almost no red meat.  In summary of symptoms, patient denies chest pain, shortness of breath, lower extremity edema, fatigue, palpitations, melena, hematuria, hemoptysis, diaphoresis, weakness, presyncope, syncope, orthopnea, and PND.   Studies Reviewed:   07/27/2017 TTE  Study Conclusions   - Left ventricle: The cavity size was normal. Wall thickness was    increased in a pattern of mild LVH. Systolic function was normal.    The estimated ejection fraction was in the range of 50% to 55%.    Wall motion was normal; there were no regional wall motion    abnormalities. Doppler parameters are consistent with abnormal    left ventricular relaxation (grade 1 diastolic dysfunction).  - Aortic valve: There was mild regurgitation.  - Right atrium: The atrium was mildly dilated.   Risk Assessment/Calculations:              Physical Exam:   VS:  BP 130/70 (BP Location: Left Arm, Patient Position: Sitting, Cuff Size: Normal)   Pulse (!) 55   Ht 5\' 4"  (1.626 m)   Wt 168 lb (76.2 kg)   SpO2 98%   BMI 28.84 kg/m    Wt Readings from Last 3 Encounters:  09/21/23 168 lb (76.2 kg)  06/09/23 162 lb (73.5 kg)  04/01/23 164 lb (74.4 kg)     Physical Exam Vitals reviewed.  Constitutional:  Appearance: Normal appearance.  HENT:     Head: Normocephalic.  Eyes:     Pupils: Pupils are equal, round, and reactive to light.  Cardiovascular:     Rate and Rhythm: Normal rate and regular rhythm.     Pulses: Normal pulses.     Heart sounds: Normal heart sounds.  Pulmonary:     Effort: Pulmonary effort is normal.     Breath sounds: Normal breath sounds.  Abdominal:     General: Abdomen is flat.     Palpations: Abdomen is soft.   Musculoskeletal:     Right lower leg: No edema.     Left lower leg: No edema.  Skin:    General: Skin is warm and dry.     Capillary Refill: Capillary refill takes less than 2 seconds.  Neurological:     General: No focal deficit present.     Mental Status: He is alert and oriented to person, place, and time.  Psychiatric:        Mood and Affect: Mood normal.        Behavior: Behavior normal.        Thought Content: Thought content normal.        Judgment: Judgment normal.     ASSESSMENT AND PLAN:     Assessment and Plan    Coronary Artery Disease Status post stenting of the proximal LAD and PTCA of the diagonal vessel in February 2023. No current chest pain or shortness of breath. -Continue Plavix and Aspirin daily. -Continue Metoprolol Tartrate 37.5mg  twice daily. -Continue Nitroglycerin as needed.  Hypertension Well controlled with average home readings of 120/73. -Continue Lopressor and Benicar as currently prescribed.  Hyperlipidemia LDL 45 as of January 2024, below the goal of 55  -Continue Crestor 20mg  daily. Repeating LDL pending with PCP.  Diastolic Heart Failure Grade I DD reported on 2018 TTE. No current symptoms of heart failure such as leg swelling or shortness of breath. -Encourage regular exercise and a low salt diet.  Follow-up -Annual physical with PCP -Schedule follow-up appointment with Dr. Claire Shown in one year.             Signed, Perlie Gold, PA-C

## 2023-09-22 ENCOUNTER — Ambulatory Visit (INDEPENDENT_AMBULATORY_CARE_PROVIDER_SITE_OTHER): Payer: PPO

## 2023-09-22 ENCOUNTER — Telehealth: Payer: Self-pay | Admitting: Hematology and Oncology

## 2023-09-22 VITALS — Ht 64.0 in | Wt 168.0 lb

## 2023-09-22 DIAGNOSIS — Z0000 Encounter for general adult medical examination without abnormal findings: Secondary | ICD-10-CM

## 2023-09-22 NOTE — Telephone Encounter (Signed)
Spoke with patient confirming upcoming appointment  

## 2023-09-22 NOTE — Patient Instructions (Signed)
Robert Mccall , Thank you for taking time to come for your Medicare Wellness Visit. I appreciate your ongoing commitment to your health goals. Please review the following plan we discussed and let me know if I can assist you in the future.   Referrals/Orders/Follow-Ups/Clinician Recommendations: No  This is a list of the screening recommended for you and due dates:  Health Maintenance  Topic Date Due   Eye exam for diabetics  06/18/2023   Complete foot exam   09/25/2023   Hemoglobin A1C  10/01/2023   COVID-19 Vaccine (6 - 2023-24 season) 10/19/2023   Yearly kidney health urinalysis for diabetes  12/18/2023   Yearly kidney function blood test for diabetes  09/09/2024   Medicare Annual Wellness Visit  09/21/2024   Colon Cancer Screening  12/15/2026   DTaP/Tdap/Td vaccine (4 - Td or Tdap) 03/17/2032   Pneumonia Vaccine  Completed   Hepatitis C Screening  Completed   Zoster (Shingles) Vaccine  Completed   HPV Vaccine  Aged Out    Advanced directives: (Copy Requested) Please bring a copy of your health care power of attorney and living will to the office to be added to your chart at your convenience.  Next Medicare Annual Wellness Visit scheduled for next year: Yes

## 2023-09-22 NOTE — Progress Notes (Signed)
Subjective:   Robert Mccall is a 72 y.o. male who presents for an Initial Medicare Annual Wellness Visit.  Visit Complete: Virtual I [name] on 09/22/23 by a audio enabled telemedicine application and verified that I am speaking with the correct person using two identifiers.  Patient Location: Home  Provider Location: Office/Clinic  I discussed the limitations of evaluation and management by telemedicine. The patient expressed understanding and agreed to proceed.  Vital Signs: Because this visit was a virtual/telehealth visit, some criteria may be missing or patient reported. Any vitals not documented were not able to be obtained and vitals that have been documented are patient reported.  Patient Medicare AWV questionnaire was completed by the patient on 09/20/2023 ; I have confirmed that all information answered by patient is correct and no changes since this date.  Cardiac Risk Factors include: advanced age (>33men, >46 women);family history of premature cardiovascular disease;hypertension;male gender;sedentary lifestyle     Objective:    Today's Vitals   09/22/23 1608 09/22/23 1609  Weight: 168 lb (76.2 kg)   Height: 5\' 4"  (1.626 m)   PainSc: 4  4   PainLoc: Back    Body mass index is 28.84 kg/m.     09/22/2023    4:17 PM 01/07/2022    8:41 AM 08/26/2020    9:42 AM 03/28/2020    7:27 PM 09/30/2017    9:36 AM 01/23/2017   12:21 PM 10/16/2016    8:57 AM  Advanced Directives  Does Patient Have a Medical Advance Directive? Yes Yes Yes Yes Yes No No  Type of Estate agent of [place];Living will Living will Healthcare Power of [place];Living will Living will     Does patient want to make changes to medical advance directive?  No - Patient declined   Yes (Inpatient - patient defers changing a medical advance directive at this time)    Copy of Healthcare Power of Attorney in Chart? No - copy requested  No - copy requested       Would patient like information on creating a medical advance directive?    No - Patient declined  No - Patient declined     Current Medications (verified) Outpatient Encounter Medications as of 09/22/2023  Medication Sig   alfuzosin (UROXATRAL) 10 MG 24 hr tablet Take 1 tablet (10 mg total) by mouth daily with breakfast.   aspirin EC 81 MG tablet Take 1 tablet (81 mg total) by mouth daily. Swallow whole.   Cholecalciferol (VITAMIN D) 50 MCG (2000 UT) CAPS Take 1 capsule (2,000 Units total) by mouth daily.   clopidogrel (PLAVIX) 75 MG tablet Take 1 tablet (75 mg) by mouth daily.-- Starting Saturday-replaces Brilinta   Docusate Sodium 100 MG capsule Take 100 mg by mouth 2 (two) times daily.   eszopiclone (LUNESTA) 2 MG TABS tablet Take 1 tablet (2 mg total) by mouth at bedtime as needed for sleep. Take immediately before bedtime   famotidine (PEPCID) 20 MG tablet Take 1 tablet (20 mg total) by mouth daily after supper   finasteride (PROSCAR) 5 MG tablet Take 1 tablet (5 mg total) by mouth daily.   ketoconazole (NIZORAL) 2 % cream Apply 1 Application topically 2 (two) times daily.   ketoconazole (NIZORAL) 2 % shampoo Apply 1 Application topically 2 (two) times a week. Allow to sit on Scalp for about 30 seconds to 1 minute   lamoTRIgine (LAMICTAL) 25 MG tablet Take 1 tablet (25 mg total) by  mouth 2 (two) times daily.   methylphenidate (METADATE ER) 20 MG ER tablet Take 1 tablet (20 mg total) by mouth daily.   metoprolol tartrate (LOPRESSOR) 25 MG tablet Take 1.5 tablets (37.5 mg total) by mouth 2 (two) times daily.   Multiple Vitamins-Minerals (ICAPS AREDS 2 PO) Take by mouth.   nitroGLYCERIN (NITROSTAT) 0.4 MG SL tablet Place 1 tablet (0.4 mg total) under the tongue every 5 (five) minutes as needed for chest pain (Call 911 if 3 doses does not relieve pain.).   olmesartan (BENICAR) 40 MG tablet TAKE 1 TABLET(40 MG) BY MOUTH DAILY   pantoprazole (PROTONIX) 40 MG tablet Take 1 tablet (40 mg total)  by mouth daily. Take 30-60 min before first meal of the day   Probiotic Product (PROBIOTIC-10 PO) Take 1 capsule by mouth daily.   rosuvastatin (CRESTOR) 20 MG tablet Take 1 tablet (20 mg total) by mouth daily.   Taurine 1000 MG CAPS Take 1 capsule by mouth daily in the afternoon.   vitamin B-12 (CYANOCOBALAMIN) 1000 MCG tablet Take 1,000 mcg by mouth daily.   [DISCONTINUED] latanoprost (XALATAN) 0.005 % ophthalmic solution Place 1 drop into both eyes at bedtime.   No facility-administered encounter medications on file as of 09/22/2023.    Allergies (verified) Patient has no known allergies.   History: Past Medical History:  Diagnosis Date   ADHD (attention deficit hyperactivity disorder)    Anxiety    Benign prostatic hypertrophy    Chronic lower back pain    surgery schedule for back 10/21/16   Cyst of right kidney    incidental [place] 1.3cm on R (MRI abd 09/2014), follows with uro   Depression    GERD (gastroesophageal reflux disease)    Glaucoma    Hard of hearing    History of migraine headaches    no meds   Hyperlipidemia    Hypertension    Menetrier's disease    Past Surgical History:  Procedure Laterality Date   COLONOSCOPY  09/2007   polyps/ Arlyce Dice   HYDROCELE EXCISION Right 1990   LEFT HEART CATH AND CORONARY ANGIOGRAPHY N/A 01/07/2022   Procedure: LEFT HEART CATH AND CORONARY ANGIOGRAPHY;  Surgeon: Marykay Lex, MD;  Location: [facility] INVASIVE CV LAB;  Service: Cardiovascular;  Laterality: N/A;   LUMBAR DISC SURGERY  10/21/2016   ORCHIECTOMY Left 1970   WISDOM TOOTH EXTRACTION     Family History  Problem Relation Age of Onset   COPD Mother    Heart disease Mother    Breast cancer Mother 58   Osteoarthritis Mother        s/p B TKR   Depression Mother    Macular degeneration Father    Diabetes Father    Cancer Father        Colon Cancer   Breast cancer Maternal Grandmother    Colon cancer Neg Hx    Esophageal cancer Neg Hx    Rectal cancer Neg Hx     Stomach cancer Neg Hx    Social History   Socioeconomic History   Marital status: Single    Spouse name: Not on file   Number of children: Not on file   Years of education: Not on file   Highest education level: Master's degree (e.g., MA, MS, MEng, MEd, MSW, MBA)  Occupational History   Occupation: Production designer, theatre/television/film    Comment: community affairs [employer]  Tobacco Use   Smoking status: Never    Passive exposure: Never   Smokeless tobacco: Never  Vaping Use   Vaping status: Never Used  Substance and Sexual Activity   Alcohol use: Yes    Comment: occ   Drug use: No   Sexual activity: Yes    Comment: working with replacement  Other Topics Concern   Not on file  Social History Narrative   Not on file   Social Determinants of Health   Financial Resource Strain: Low Risk  (09/22/2023)   Overall Financial Resource Strain (CARDIA)    Difficulty of Paying Living Expenses: Not hard at all  Food Insecurity: No Food Insecurity (09/22/2023)   Hunger Vital Sign    Worried About Running Out of Food in the Last Year: Never true    Ran Out of Food in the Last Year: Never true  Transportation Needs: No Transportation Needs (09/22/2023)   PRAPARE - Administrator, Civil Service (Medical): No    Lack of Transportation (Non-Medical): No  Physical Activity: Inactive (09/22/2023)   Exercise Vital Sign    Days of Exercise per Week: 0 days    Minutes of Exercise per Session: 0 min  Stress: No Stress Concern Present (09/22/2023)   [employer] of Occupational Health - Occupational Stress Questionnaire    Feeling of Stress : Not at all  Social Connections: Unknown (09/22/2023)   Social Connection and Isolation Panel [NHANES]    Frequency of Communication with Friends and Family: Never    Frequency of Social Gatherings with Friends and Family: Patient declined    Attends Religious Services: Patient declined    Database administrator or Organizations: Yes    Attends Museum/gallery exhibitions officer: 1 to 4 times per year    Marital Status: Divorced    Tobacco Counseling Counseling given: Not Answered   Clinical Intake:  Pre-visit preparation completed: Yes  Pain : 0-10 Pain Score: 4  Pain Type: Chronic pain Pain Location: Back     BMI - recorded: 28.84 Nutritional Status: BMI 25 -29 Overweight Nutritional Risks: None Diabetes: No  How often do you need to have someone help you when you read instructions, pamphlets, or other written materials from your doctor or pharmacy?: 1 - Never What is the last grade level you completed in school?: College Graduate  Interpreter Needed?: No  Information entered by :: Susie Cassette, LPN.   Activities of Daily Living    09/22/2023    4:20 PM 09/20/2023    6:51 PM  In your present state of health, do you have any difficulty performing the following activities:  Hearing? 1 1  Vision? 0 0  Difficulty concentrating or making decisions? 0 0  Walking or climbing stairs? 0 0  Dressing or bathing? 0 0  Doing errands, shopping? 0 0  Preparing Food and eating ? N N  Using the Toilet? N N  In the past six months, have you accidently leaked urine? N N  Do you have problems with loss of bowel control? N N  Managing your Medications? N N  Managing your Finances? N N  Housekeeping or managing your Housekeeping? N N    Patient Care Team: [name], MD as PCP - General (Internal Medicine) Nahser, Deloris Ping, MD as PCP - Cardiology (Cardiology) Gelene Mink, OD (Optometry) Nahser, Deloris Ping, MD as Consulting Physician (Cardiology)  Indicate any recent Medical Services you may have received from other than [facility] providers in the past year (date may be approximate).     Assessment:   This is a routine  wellness examination for Alvia.  Hearing/Vision screen Hearing Screening - Comments:: Patient has hearing difficulty. Vision Screening - Comments:: Eye exam done by: Gelene Mink, OD.   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        Depression Screen    09/22/2023    4:12 PM 04/01/2023   10:23 AM 12/17/2022   10:15 AM 03/16/2022    9:18 AM 01/23/2022    4:04 PM 08/28/2021    2:02 PM 02/25/2021    2:36 PM  PHQ 2/9 Scores  PHQ - 2 Score 0 0 0 0 0 0 1  PHQ- 9 Score 0 0 0 0  0 10    Fall Risk    09/22/2023    4:20 PM 09/20/2023    6:51 PM 04/01/2023   10:23 AM 01/23/2022    4:03 PM 02/25/2021    1:43 PM  Fall Risk   Falls in the past year? 0 0 0 1 1  Number falls in past yr: 0 0 0 0 0  Injury with Fall? 0  0 0 0  Risk for fall due to : No Fall Risks  No Fall Risks    Follow up Falls prevention discussed  Falls evaluation completed      MEDICARE RISK AT HOME: Medicare Risk at Home Any stairs in or around the home?: Yes If so, are there any without handrails?: No Home free of loose throw rugs in walkways, pet beds, electrical cords, etc?: Yes Adequate lighting in your home to reduce risk of falls?: Yes Life alert?: No Use of a cane, walker or w/c?: No Grab bars in the bathroom?: No Shower chair or bench in shower?: No Elevated toilet seat or a handicapped toilet?: Yes  TIMED UP AND GO:  Was the test performed? No    Cognitive Function: Normal cognitive status assessed by direct observation via telephone conversation by this Nurse Health Advisor. No abnormalities found.    09/22/2023    4:20 PM 02/23/2017   12:51 PM  MMSE - Mini Mental State Exam  Not completed: Unable to complete Refused      05/18/2016    9:16 AM  Montreal Cognitive Assessment   Visuospatial/ Executive (0/5) 5  Naming (0/3) 2  Attention: Read list of digits (0/2) 2  Attention: Read list of letters (0/1) 1  Attention: Serial 7 subtraction starting at 100 (0/3) 3  Language: Repeat phrase (0/2) 2  Language : Fluency (0/1) 1  Abstraction (0/2) 2  Delayed Recall (0/5) 4  Orientation (0/6) 6  Total 28   Adjusted Score (based on education) 28      09/22/2023    4:21 PM  6CIT Screen  What Year? 0 points  What month? 0 points  What time? 0 points  Count back from 20 0 points  Months in reverse 0 points  Repeat phrase 0 points  Total Score 0 points    Immunizations Immunization History  Administered Date(s) Administered   Fluad Quad(high Dose 65+) 09/14/2019, 08/28/2021, 09/16/2022   Influenza Split 08/15/2012   Influenza Whole 09/06/2009, 10/07/2010   Influenza, High Dose Seasonal PF 08/10/2017, 08/09/2018   Influenza,inj,Quad PF,6+ Mos 08/31/2013, 10/02/2015   Influenza-Unspecified 09/06/2014, 08/22/2016, 08/24/2023   PFIZER(Purple Top)SARS-COV-2 Vaccination 02/13/2020, 09/07/2020, 04/05/2021, 09/20/2021, 08/24/2023   Pneumococcal Conjugate-13 04/09/2016   Pneumococcal Polysaccharide-23 07/13/2017, 09/16/2022  Td 03/31/2002   Tdap 05/06/2012, 03/17/2022   Zoster Recombinant(Shingrix) 03/17/2022, 11/21/2022   Zoster, Live 02/13/2013    TDAP status: Up to date  Flu Vaccine status: Up to date  Pneumococcal vaccine status: Up to date  Covid-19 vaccine status: Completed vaccines  Qualifies for Shingles Vaccine? Yes   Zostavax completed Yes   Shingrix Completed?: Yes  Screening Tests Health Maintenance  Topic Date Due   OPHTHALMOLOGY EXAM  06/18/2023   FOOT EXAM  09/25/2023   HEMOGLOBIN A1C  10/01/2023   COVID-19 Vaccine (6 - 2023-24 season) 10/19/2023   Diabetic kidney evaluation - Urine ACR  12/18/2023   Diabetic kidney evaluation - eGFR measurement  09/09/2024   Medicare Annual Wellness (AWV)  09/21/2024   Colonoscopy  12/15/2026   DTaP/Tdap/Td (4 - Td or Tdap) 03/17/2032   Pneumonia Vaccine 72+ Years old  Completed   Hepatitis C Screening  Completed   Zoster Vaccines- Shingrix  Completed   HPV VACCINES  Aged Out    Health Maintenance  Health Maintenance Due  Topic Date Due   OPHTHALMOLOGY EXAM  06/18/2023    Colorectal cancer screening: Type of  screening: Colonoscopy. Completed 12/15/2016. Repeat every 10 years  Lung Cancer Screening: (Low Dose CT Chest recommended if Age 95-80 years, 20 pack-year currently smoking OR have quit w/in 15years.) does not qualify.   Lung Cancer Screening Referral: NO  Additional Screening:  Hepatitis C Screening: does qualify; Completed 02/03/2016  Vision Screening: Recommended annual ophthalmology exams for early detection of glaucoma and other disorders of the eye. Is the patient up to date with their annual eye exam?  Yes  Who is the provider or what is the name of the office in which the patient attends annual eye exams? Gelene Mink, OD. If pt is not established with a provider, would they like to be referred to a provider to establish care? No .   Dental Screening: Recommended annual dental exams for proper oral hygiene  Diabetic Foot Exam: Diabetic Foot Exam: Overdue, Pt has been advised about the importance in completing this exam. Pt is scheduled for diabetic foot exam on 10/21/2023.  Community Resource Referral / Chronic Care Management: CRR required this visit?  No   CCM required this visit?  No    Plan:     I have personally reviewed and noted the following in the patient's chart:   Medical and social history Use of alcohol, tobacco or illicit drugs  Current medications and supplements including opioid prescriptions. Patient is not currently taking opioid prescriptions. Functional ability and status Nutritional status Physical activity Advanced directives List of other physicians Hospitalizations, surgeries, and ER visits in previous 12 months Vitals Screenings to include cognitive, depression, and falls Referrals and appointments  In addition, I have reviewed and discussed with patient certain preventive protocols, quality metrics, and best practice recommendations. A written personalized care plan for preventive services as well as general preventive health recommendations  were provided to patient.     Mickeal Needy, LPN   [id number]/29/[id number]   After Visit Summary: (MyChart) Due to this being a telephonic visit, the after visit summary with patients personalized plan was offered to patient via MyChart   Nurse Notes: n/a

## 2023-09-25 ENCOUNTER — Other Ambulatory Visit: Payer: Self-pay | Admitting: Internal Medicine

## 2023-09-25 ENCOUNTER — Other Ambulatory Visit (HOSPITAL_COMMUNITY): Payer: Self-pay

## 2023-09-25 DIAGNOSIS — E785 Hyperlipidemia, unspecified: Secondary | ICD-10-CM

## 2023-09-25 DIAGNOSIS — N401 Enlarged prostate with lower urinary tract symptoms: Secondary | ICD-10-CM

## 2023-09-25 DIAGNOSIS — I2510 Atherosclerotic heart disease of native coronary artery without angina pectoris: Secondary | ICD-10-CM

## 2023-09-25 DIAGNOSIS — I25119 Atherosclerotic heart disease of native coronary artery with unspecified angina pectoris: Secondary | ICD-10-CM

## 2023-09-25 DIAGNOSIS — I10 Essential (primary) hypertension: Secondary | ICD-10-CM

## 2023-09-25 DIAGNOSIS — K219 Gastro-esophageal reflux disease without esophagitis: Secondary | ICD-10-CM

## 2023-09-26 ENCOUNTER — Encounter: Payer: Self-pay | Admitting: Internal Medicine

## 2023-09-26 ENCOUNTER — Other Ambulatory Visit (HOSPITAL_COMMUNITY): Payer: Self-pay

## 2023-09-26 MED ORDER — METOPROLOL TARTRATE 25 MG PO TABS
37.5000 mg | ORAL_TABLET | Freq: Two times a day (BID) | ORAL | 0 refills | Status: DC
Start: 2023-09-26 — End: 2024-03-08
  Filled 2023-09-26 – 2023-10-29 (×2): qty 270, 90d supply, fill #0

## 2023-09-26 MED ORDER — ROSUVASTATIN CALCIUM 20 MG PO TABS
20.0000 mg | ORAL_TABLET | Freq: Every day | ORAL | 0 refills | Status: DC
  Filled 2023-09-26: qty 90, 90d supply, fill #0

## 2023-09-26 MED ORDER — OLMESARTAN MEDOXOMIL 40 MG PO TABS
40.0000 mg | ORAL_TABLET | Freq: Every day | ORAL | 0 refills | Status: DC
Start: 2023-09-26 — End: 2024-02-10
  Filled 2023-09-26: qty 90, 90d supply, fill #0

## 2023-09-26 MED ORDER — PANTOPRAZOLE SODIUM 40 MG PO TBEC
40.0000 mg | DELAYED_RELEASE_TABLET | Freq: Every day | ORAL | 0 refills | Status: DC
Start: 2023-09-26 — End: 2024-02-10
  Filled 2023-09-26: qty 90, 90d supply, fill #0

## 2023-09-26 MED ORDER — ALFUZOSIN HCL ER 10 MG PO TB24
10.0000 mg | ORAL_TABLET | Freq: Every day | ORAL | 0 refills | Status: DC
  Filled 2023-09-26 – 2023-10-29 (×2): qty 90, 90d supply, fill #0

## 2023-09-27 ENCOUNTER — Other Ambulatory Visit: Payer: Self-pay

## 2023-09-27 ENCOUNTER — Other Ambulatory Visit (HOSPITAL_COMMUNITY): Payer: Self-pay

## 2023-09-27 DIAGNOSIS — Z961 Presence of intraocular lens: Secondary | ICD-10-CM | POA: Diagnosis not present

## 2023-09-27 DIAGNOSIS — H35372 Puckering of macula, left eye: Secondary | ICD-10-CM | POA: Diagnosis not present

## 2023-09-27 DIAGNOSIS — H43813 Vitreous degeneration, bilateral: Secondary | ICD-10-CM | POA: Diagnosis not present

## 2023-09-27 DIAGNOSIS — F900 Attention-deficit hyperactivity disorder, predominantly inattentive type: Secondary | ICD-10-CM

## 2023-09-27 DIAGNOSIS — H3530 Unspecified macular degeneration: Secondary | ICD-10-CM | POA: Diagnosis not present

## 2023-09-27 DIAGNOSIS — H401131 Primary open-angle glaucoma, bilateral, mild stage: Secondary | ICD-10-CM | POA: Diagnosis not present

## 2023-09-27 MED ORDER — METHYLPHENIDATE HCL ER 20 MG PO TBCR
20.0000 mg | EXTENDED_RELEASE_TABLET | Freq: Every day | ORAL | 0 refills | Status: DC
  Filled 2023-09-27: qty 90, 90d supply, fill #0

## 2023-09-29 ENCOUNTER — Ambulatory Visit: Payer: PPO | Admitting: Internal Medicine

## 2023-09-29 DIAGNOSIS — H43391 Other vitreous opacities, right eye: Secondary | ICD-10-CM | POA: Diagnosis not present

## 2023-10-07 DIAGNOSIS — H3530 Unspecified macular degeneration: Secondary | ICD-10-CM | POA: Diagnosis not present

## 2023-10-07 DIAGNOSIS — H401131 Primary open-angle glaucoma, bilateral, mild stage: Secondary | ICD-10-CM | POA: Diagnosis not present

## 2023-10-07 DIAGNOSIS — H35372 Puckering of macula, left eye: Secondary | ICD-10-CM | POA: Diagnosis not present

## 2023-10-07 DIAGNOSIS — H43813 Vitreous degeneration, bilateral: Secondary | ICD-10-CM | POA: Diagnosis not present

## 2023-10-07 DIAGNOSIS — Z961 Presence of intraocular lens: Secondary | ICD-10-CM | POA: Diagnosis not present

## 2023-10-17 ENCOUNTER — Encounter: Payer: Self-pay | Admitting: Internal Medicine

## 2023-10-18 ENCOUNTER — Other Ambulatory Visit: Payer: Self-pay

## 2023-10-18 DIAGNOSIS — F5104 Psychophysiologic insomnia: Secondary | ICD-10-CM

## 2023-10-18 MED ORDER — ESZOPICLONE 2 MG PO TABS: 2.0000 mg | ORAL_TABLET | Freq: Every evening | ORAL | 0 refills | Status: DC | PRN

## 2023-10-23 ENCOUNTER — Encounter: Payer: Self-pay | Admitting: Internal Medicine

## 2023-10-25 ENCOUNTER — Other Ambulatory Visit: Payer: Self-pay | Admitting: Internal Medicine

## 2023-10-25 ENCOUNTER — Other Ambulatory Visit (HOSPITAL_COMMUNITY): Payer: Self-pay

## 2023-10-25 DIAGNOSIS — F900 Attention-deficit hyperactivity disorder, predominantly inattentive type: Secondary | ICD-10-CM

## 2023-10-25 MED ORDER — DULOXETINE HCL 30 MG PO CPEP
30.0000 mg | ORAL_CAPSULE | Freq: Every day | ORAL | 0 refills | Status: DC
  Filled 2023-10-25: qty 90, 90d supply, fill #0

## 2023-10-26 ENCOUNTER — Ambulatory Visit: Payer: PPO | Admitting: Internal Medicine

## 2023-10-28 ENCOUNTER — Other Ambulatory Visit (HOSPITAL_COMMUNITY): Payer: Self-pay

## 2023-10-29 ENCOUNTER — Other Ambulatory Visit (HOSPITAL_COMMUNITY): Payer: Self-pay

## 2023-10-29 ENCOUNTER — Other Ambulatory Visit: Payer: Self-pay

## 2023-11-07 ENCOUNTER — Encounter: Payer: Self-pay | Admitting: Internal Medicine

## 2023-11-08 ENCOUNTER — Encounter: Payer: Self-pay | Admitting: Internal Medicine

## 2023-11-08 ENCOUNTER — Other Ambulatory Visit: Payer: Self-pay | Admitting: Internal Medicine

## 2023-11-08 ENCOUNTER — Other Ambulatory Visit (HOSPITAL_COMMUNITY): Payer: Self-pay

## 2023-11-08 DIAGNOSIS — F900 Attention-deficit hyperactivity disorder, predominantly inattentive type: Secondary | ICD-10-CM

## 2023-11-08 MED ORDER — METHYLPHENIDATE HCL ER 20 MG PO TBCR
20.0000 mg | EXTENDED_RELEASE_TABLET | Freq: Every day | ORAL | 0 refills | Status: DC
  Filled 2023-11-08: qty 30, 30d supply, fill #0

## 2023-11-09 ENCOUNTER — Other Ambulatory Visit (HOSPITAL_COMMUNITY): Payer: Self-pay

## 2023-11-09 ENCOUNTER — Other Ambulatory Visit: Payer: Self-pay | Admitting: Internal Medicine

## 2023-11-09 DIAGNOSIS — F900 Attention-deficit hyperactivity disorder, predominantly inattentive type: Secondary | ICD-10-CM

## 2023-11-29 ENCOUNTER — Other Ambulatory Visit (HOSPITAL_COMMUNITY): Payer: Self-pay

## 2023-11-29 ENCOUNTER — Encounter: Payer: Self-pay | Admitting: Internal Medicine

## 2023-11-29 ENCOUNTER — Telehealth: Payer: PPO

## 2023-11-29 DIAGNOSIS — J019 Acute sinusitis, unspecified: Secondary | ICD-10-CM | POA: Diagnosis not present

## 2023-11-29 DIAGNOSIS — B9689 Other specified bacterial agents as the cause of diseases classified elsewhere: Secondary | ICD-10-CM | POA: Diagnosis not present

## 2023-11-29 MED ORDER — AMOXICILLIN-POT CLAVULANATE 875-125 MG PO TABS
1.0000 | ORAL_TABLET | Freq: Two times a day (BID) | ORAL | 0 refills | Status: AC
  Filled 2023-11-29: qty 14, 7d supply, fill #0

## 2023-11-29 NOTE — Telephone Encounter (Signed)
 Care team updated and letter sent for eye exam notes.

## 2023-11-29 NOTE — Progress Notes (Signed)
Virtual Visit Consent   Robert Mccall, you are scheduled for a virtual visit with a [facility] provider today. Just as with appointments in the office, your consent must be obtained to participate. Your consent will be active for this visit and any virtual visit you may have with one of our providers in the next 365 days. If you have a MyChart account, a copy of this consent can be sent to you electronically.  As this is a virtual visit, video technology does not allow for your provider to perform a traditional examination. This may limit your provider's ability to fully assess your condition. If your provider identifies any concerns that need to be evaluated in person or the need to arrange testing (such as labs, EKG, etc.), we will make arrangements to do so. Although advances in technology are sophisticated, we cannot ensure that it will always work on either your end or our end. If the connection with a video visit is poor, the visit may have to be switched to a telephone visit. With either a video or telephone visit, we are not always able to ensure that we have a secure connection.  By engaging in this virtual visit, you consent to the provision of healthcare and authorize for your insurance to be billed (if applicable) for the services provided during this visit. Depending on your insurance coverage, you may receive a charge related to this service.  I need to obtain your verbal consent now. Are you willing to proceed with your visit today? Robert Mccall has provided verbal consent on 11/29/2023 for a virtual visit (video or telephone). [name], NP  Date: 11/29/2023 11:01 AM  Virtual Visit via Audio Note   I, [name], [name]  ([id number], 1951/02/16) on 11/29/23 at 11:00 AM EST by a audio-enabled telemedicine application and verified that I am speaking with the correct person using two identifiers.  Location: Patient: Virtual Visit Location  Patient: Home Provider: Virtual Visit Location Provider: Home Office   I discussed the limitations of evaluation and management by telemedicine and the availability of in person appointments. The patient expressed understanding and agreed to proceed.    History of Present Illness: Robert Mccall is a 72 y.o. who identifies as a male who was assigned male at birth, and is being seen today for URI  Onset was a week ago voice started going out, and headache started yesterday, congestion and sore throat Associated symptoms are cough and feels like congestion in chest  Modifying factors are allergy pills Denies chest pain, shortness of breath, fevers, chills  Exposure to sick contacts- unknown COVID test: no Vaccines: Flu      Problems:  Patient Active Problem List   Diagnosis Date Noted   Onychomycosis of great toe 06/09/2023   Psychophysiological insomnia 04/08/2023   Non-restorative sleep 04/01/2023   NASH (nonalcoholic steatohepatitis) 12/18/2022   Encounter for general adult medical examination with abnormal findings 03/20/2022   Nocturnal leg cramps 03/20/2022   Dyslipidemia, goal LDL below 70 03/16/2022   Coronary artery disease involving native coronary artery of native heart with angina pectoris (HCC) 01/07/2022   Ventral hernia without obstruction or gangrene 12/03/2021   Screening-pulmonary TB 08/28/2021   Chronic eczematous otitis externa of both ears 01/11/2020   Pure hypertriglyceridemia 11/30/2019   Elevated serum creatinine 09/14/2019   Chronic midline low back pain with bilateral sciatica 03/13/2019   CHF (congestive heart failure), NYHA class I, chronic, diastolic (  HCC) 09/28/2018   Cervical disc disorder at C5-C6 level with radiculopathy 11/10/2017   B12 deficiency 08/26/2017   Obesity (BMI 30.0-34.9) 08/10/2017   Diastolic dysfunction without heart failure 07/28/2017   Hypogonadism male 07/13/2017   Erectile dysfunction due to arterial insufficiency  07/13/2017   Nonspecific abnormal electrocardiogram (ECG) (EKG) 07/13/2017   History of colonic polyps 09/16/2016   MGUS (monoclonal gammopathy of unknown significance) 06/11/2016   Neuropathy associated with monoclonal gammopathy of undetermined significance (HCC) 06/11/2016   Coarse tremors 04/09/2016   Restless legs syndrome with nocturnal myoclonus 10/02/2015   Acid reflux 05/07/2015   Kidney cysts 05/07/2015   Type II diabetes mellitus with manifestations (HCC) 11/14/2014   Disorder of bone and cartilage 09/24/2009   DUPUYTREN'S CONTRACTURE, RIGHT 09/06/2008   DEGENERATION, LUMBAR/LUMBOSACRAL DISC 07/15/2007   Attention deficit disorder 06/14/2007   Essential hypertension 05/13/2007   BPH associated with nocturia 05/13/2007    Allergies: No Known Allergies Medications:  Current Outpatient Medications:    DULoxetine (CYMBALTA) 30 MG capsule, Take 1 capsule (30 mg total) by mouth daily., Disp: 90 capsule, Rfl: 0   alfuzosin (UROXATRAL) 10 MG 24 hr tablet, Take 1 tablet (10 mg total) by mouth daily with breakfast., Disp: 90 tablet, Rfl: 0   aspirin EC 81 MG tablet, Take 1 tablet (81 mg total) by mouth daily. Swallow whole., Disp: 90 tablet, Rfl: 3   Cholecalciferol (VITAMIN D) 50 MCG (2000 UT) CAPS, Take 1 capsule (2,000 Units total) by mouth daily., Disp: 30 capsule, Rfl: 11   clopidogrel (PLAVIX) 75 MG tablet, Take 1 tablet (75 mg) by mouth daily.-- Starting [employer], Disp: 90 tablet, Rfl: 2   Docusate Sodium 100 MG capsule, Take 100 mg by mouth 2 (two) times daily., Disp: , Rfl:    eszopiclone (LUNESTA) 2 MG TABS tablet, Take 1 tablet (2 mg total) by mouth at bedtime as needed for sleep. Take immediately before bedtime, Disp: 90 tablet, Rfl: 0   famotidine (PEPCID) 20 MG tablet, Take 1 tablet (20 mg total) by mouth daily after supper, Disp: 90 tablet, Rfl: 0   finasteride (PROSCAR) 5 MG tablet, Take 1 tablet (5 mg total) by mouth daily., Disp: 90 tablet, Rfl: 0    ketoconazole (NIZORAL) 2 % cream, Apply 1 Application topically 2 (two) times daily., Disp: 60 g, Rfl: 5   ketoconazole (NIZORAL) 2 % shampoo, Apply 1 Application topically 2 (two) times a week. Allow to sit on Scalp for about 30 seconds to 1 minute, Disp: 120 mL, Rfl: 6   lamoTRIgine (LAMICTAL) 25 MG tablet, Take 1 tablet (25 mg total) by mouth 2 (two) times daily., Disp: 180 tablet, Rfl: 0   methylphenidate (METADATE ER) 20 MG ER tablet, TAKE ONE TABLET BY MOUTH ONCE A DAY, Disp: 90 tablet, Rfl: 0   metoprolol tartrate (LOPRESSOR) 25 MG tablet, Take 1.5 tablets (37.5 mg total) by mouth 2 (two) times daily., Disp: 270 tablet, Rfl: 0   Multiple Vitamins-Minerals (ICAPS AREDS 2 PO), Take by mouth., Disp: , Rfl:    nitroGLYCERIN (NITROSTAT) 0.4 MG SL tablet, Place 1 tablet (0.4 mg total) under the tongue every 5 (five) minutes as needed for chest pain (Call 911 if 3 doses does not relieve pain.)., Disp: 25 tablet, Rfl: 3   olmesartan (BENICAR) 40 MG tablet, Take 1 tablet (40 mg total) by mouth daily., Disp: 90 tablet, Rfl: 0   pantoprazole (PROTONIX) 40 MG tablet, Take 1 tablet (40 mg total) by mouth daily. Take 30-60 min before  first meal of the day, Disp: 90 tablet, Rfl: 0   Probiotic Product (PROBIOTIC-10 PO), Take 1 capsule by mouth daily., Disp: , Rfl:    rosuvastatin (CRESTOR) 20 MG tablet, Take 1 tablet (20 mg total) by mouth daily., Disp: 90 tablet, Rfl: 0   Taurine 1000 MG CAPS, Take 1 capsule by mouth daily in the afternoon., Disp: , Rfl:    vitamin B-12 (CYANOCOBALAMIN) 1000 MCG tablet, Take 1,000 mcg by mouth daily., Disp: , Rfl:   Observations/Objective: Patient is well-developed, well-nourished in no acute distress.  Resting comfortably  at home.  Head is normocephalic, atraumatic.  No labored breathing.  Speech is clear and coherent with logical content.  Patient is alert and oriented at baseline.    Assessment and Plan:   1. Acute bacterial sinusitis (Primary)  -  amoxicillin-clavulanate (AUGMENTIN) 875-125 MG tablet; Take 1 tablet by mouth 2 (two) times daily for 7 days.  Dispense: 14 tablet; Refill: 0  URI recommendations: - Increased rest - Increasing Fluids - Acetaminophen / ibuprofen as needed for fever/pain.  - Salt water gargling, chloraseptic spray and throat lozenges - Mucinex if mucus is present and increasing.  - Saline nasal spray if congestion or if nasal passages feel dry. - Humidifying the air.   Reviewed side effects, risks and benefits of medication.    Patient acknowledged agreement and understanding of the plan.   Past Medical, Surgical, Social History, Allergies, and Medications have been Reviewed.        I provided 7 minutes of non face-to-face time during this encounter for chart review, medication and order placement, as well as and documentation.     Follow Up Instructions: I discussed the assessment and treatment plan with the patient. The patient was provided an opportunity to ask questions and all were answered. The patient agreed with the plan and demonstrated an understanding of the instructions.  A copy of instructions were sent to the patient via MyChart unless otherwise noted below.    The patient was advised to call back or seek an in-person evaluation if the symptoms worsen or if the condition fails to improve as anticipated.    [name], NP

## 2023-11-29 NOTE — Patient Instructions (Signed)
Robert Mccall, thank you for joining [name], NP for today's virtual visit.  While this provider is not your primary care provider (PCP), if your PCP is located in our provider database this encounter information will be shared with them immediately following your visit.   A [facility] MyChart account gives you access to today's visit and all your visits, tests, and labs performed at [facility] " click here if you don't have a [facility] MyChart account or go to [email or web address]  Consent: (Patient) Robert Mccall provided verbal consent for this virtual visit at the beginning of the encounter.  Current Medications:  Current Outpatient Medications:    amoxicillin-clavulanate (AUGMENTIN) 875-125 MG tablet, Take 1 tablet by mouth 2 (two) times daily for 7 days., Disp: 14 tablet, Rfl: 0   DULoxetine (CYMBALTA) 30 MG capsule, Take 1 capsule (30 mg total) by mouth daily., Disp: 90 capsule, Rfl: 0   alfuzosin (UROXATRAL) 10 MG 24 hr tablet, Take 1 tablet (10 mg total) by mouth daily with breakfast., Disp: 90 tablet, Rfl: 0   aspirin EC 81 MG tablet, Take 1 tablet (81 mg total) by mouth daily. Swallow whole., Disp: 90 tablet, Rfl: 3   Cholecalciferol (VITAMIN D) 50 MCG (2000 UT) CAPS, Take 1 capsule (2,000 Units total) by mouth daily., Disp: 30 capsule, Rfl: 11   clopidogrel (PLAVIX) 75 MG tablet, Take 1 tablet (75 mg) by mouth daily.-- Starting [employer], Disp: 90 tablet, Rfl: 2   Docusate Sodium 100 MG capsule, Take 100 mg by mouth 2 (two) times daily., Disp: , Rfl:    eszopiclone (LUNESTA) 2 MG TABS tablet, Take 1 tablet (2 mg total) by mouth at bedtime as needed for sleep. Take immediately before bedtime, Disp: 90 tablet, Rfl: 0   famotidine (PEPCID) 20 MG tablet, Take 1 tablet (20 mg total) by mouth daily after supper, Disp: 90 tablet, Rfl: 0   finasteride (PROSCAR) 5 MG tablet, Take 1 tablet (5 mg total) by mouth daily., Disp: 90 tablet,  Rfl: 0   ketoconazole (NIZORAL) 2 % cream, Apply 1 Application topically 2 (two) times daily., Disp: 60 g, Rfl: 5   ketoconazole (NIZORAL) 2 % shampoo, Apply 1 Application topically 2 (two) times a week. Allow to sit on Scalp for about 30 seconds to 1 minute, Disp: 120 mL, Rfl: 6   lamoTRIgine (LAMICTAL) 25 MG tablet, Take 1 tablet (25 mg total) by mouth 2 (two) times daily., Disp: 180 tablet, Rfl: 0   methylphenidate (METADATE ER) 20 MG ER tablet, TAKE ONE TABLET BY MOUTH ONCE A DAY, Disp: 90 tablet, Rfl: 0   metoprolol tartrate (LOPRESSOR) 25 MG tablet, Take 1.5 tablets (37.5 mg total) by mouth 2 (two) times daily., Disp: 270 tablet, Rfl: 0   Multiple Vitamins-Minerals (ICAPS AREDS 2 PO), Take by mouth., Disp: , Rfl:    nitroGLYCERIN (NITROSTAT) 0.4 MG SL tablet, Place 1 tablet (0.4 mg total) under the tongue every 5 (five) minutes as needed for chest pain (Call 911 if 3 doses does not relieve pain.)., Disp: 25 tablet, Rfl: 3   olmesartan (BENICAR) 40 MG tablet, Take 1 tablet (40 mg total) by mouth daily., Disp: 90 tablet, Rfl: 0   pantoprazole (PROTONIX) 40 MG tablet, Take 1 tablet (40 mg total) by mouth daily. Take 30-60 min before first meal of the day, Disp: 90 tablet, Rfl: 0   Probiotic Product (PROBIOTIC-10 PO), Take 1 capsule by mouth daily., Disp: , Rfl:    rosuvastatin (CRESTOR)  20 MG tablet, Take 1 tablet (20 mg total) by mouth daily., Disp: 90 tablet, Rfl: 0   Taurine 1000 MG CAPS, Take 1 capsule by mouth daily in the afternoon., Disp: , Rfl:    vitamin B-12 (CYANOCOBALAMIN) 1000 MCG tablet, Take 1,000 mcg by mouth daily., Disp: , Rfl:    Medications ordered in this encounter:  Meds ordered this encounter  Medications   amoxicillin-clavulanate (AUGMENTIN) 875-125 MG tablet    Sig: Take 1 tablet by mouth 2 (two) times daily for 7 days.    Dispense:  14 tablet    Refill:  0    Supervising Provider:   Merrilee Jansky [[id number]]     *If you need refills on other medications prior  to your next appointment, please contact your pharmacy*  Follow-Up: Call back or seek an in-person evaluation if the symptoms worsen or if the condition fails to improve as anticipated.  [facility] [phone]  Other Instructions  URI recommendations: - Increased rest - Increasing Fluids - Acetaminophen / ibuprofen as needed for fever/pain.  - Salt water gargling, chloraseptic spray and throat lozenges - Mucinex if mucus is present and increasing.  - Saline nasal spray if congestion or if nasal passages feel dry. - Humidifying the air.     If you have been instructed to have an in-person evaluation today at a local Urgent Care facility, please use the link below. It will take you to a list of all of our available [facility], including address, phone number and hours of operation. Please do not delay care.  [facility]  If you or a family member do not have a primary care provider, use the link below to schedule a visit and establish care. When you choose a [facility] primary care physician or advanced practice provider, you gain a long-term partner in health. Find a Primary Care Provider  Learn more about [facility]'s in-office and virtual care options: [facility] - Get Care Now

## 2023-12-14 DIAGNOSIS — H3530 Unspecified macular degeneration: Secondary | ICD-10-CM | POA: Diagnosis not present

## 2023-12-14 DIAGNOSIS — H35372 Puckering of macula, left eye: Secondary | ICD-10-CM | POA: Diagnosis not present

## 2023-12-14 DIAGNOSIS — H43813 Vitreous degeneration, bilateral: Secondary | ICD-10-CM | POA: Diagnosis not present

## 2023-12-14 DIAGNOSIS — Z961 Presence of intraocular lens: Secondary | ICD-10-CM | POA: Diagnosis not present

## 2023-12-14 DIAGNOSIS — H401131 Primary open-angle glaucoma, bilateral, mild stage: Secondary | ICD-10-CM | POA: Diagnosis not present

## 2023-12-14 LAB — HM DIABETES EYE EXAM

## 2023-12-20 ENCOUNTER — Other Ambulatory Visit: Payer: Self-pay | Admitting: Internal Medicine

## 2023-12-20 DIAGNOSIS — N401 Enlarged prostate with lower urinary tract symptoms: Secondary | ICD-10-CM

## 2023-12-20 DIAGNOSIS — I10 Essential (primary) hypertension: Secondary | ICD-10-CM

## 2023-12-20 DIAGNOSIS — K219 Gastro-esophageal reflux disease without esophagitis: Secondary | ICD-10-CM

## 2023-12-20 DIAGNOSIS — F331 Major depressive disorder, recurrent, moderate: Secondary | ICD-10-CM

## 2023-12-20 DIAGNOSIS — I2510 Atherosclerotic heart disease of native coronary artery without angina pectoris: Secondary | ICD-10-CM

## 2023-12-29 ENCOUNTER — Other Ambulatory Visit: Payer: Self-pay | Admitting: Internal Medicine

## 2023-12-29 DIAGNOSIS — E785 Hyperlipidemia, unspecified: Secondary | ICD-10-CM

## 2023-12-29 DIAGNOSIS — K219 Gastro-esophageal reflux disease without esophagitis: Secondary | ICD-10-CM

## 2023-12-29 DIAGNOSIS — I25119 Atherosclerotic heart disease of native coronary artery with unspecified angina pectoris: Secondary | ICD-10-CM

## 2024-01-13 ENCOUNTER — Other Ambulatory Visit: Payer: Self-pay | Admitting: Internal Medicine

## 2024-01-13 DIAGNOSIS — K219 Gastro-esophageal reflux disease without esophagitis: Secondary | ICD-10-CM

## 2024-01-17 ENCOUNTER — Other Ambulatory Visit: Payer: Self-pay | Admitting: Internal Medicine

## 2024-01-17 ENCOUNTER — Other Ambulatory Visit: Payer: Self-pay | Admitting: Cardiovascular Disease

## 2024-01-17 DIAGNOSIS — I25119 Atherosclerotic heart disease of native coronary artery with unspecified angina pectoris: Secondary | ICD-10-CM

## 2024-01-17 DIAGNOSIS — E785 Hyperlipidemia, unspecified: Secondary | ICD-10-CM

## 2024-01-17 DIAGNOSIS — I10 Essential (primary) hypertension: Secondary | ICD-10-CM

## 2024-01-17 DIAGNOSIS — K219 Gastro-esophageal reflux disease without esophagitis: Secondary | ICD-10-CM

## 2024-01-17 DIAGNOSIS — I2510 Atherosclerotic heart disease of native coronary artery without angina pectoris: Secondary | ICD-10-CM

## 2024-01-17 DIAGNOSIS — N401 Enlarged prostate with lower urinary tract symptoms: Secondary | ICD-10-CM

## 2024-01-17 DIAGNOSIS — F331 Major depressive disorder, recurrent, moderate: Secondary | ICD-10-CM

## 2024-01-17 DIAGNOSIS — F5104 Psychophysiologic insomnia: Secondary | ICD-10-CM

## 2024-01-17 DIAGNOSIS — F900 Attention-deficit hyperactivity disorder, predominantly inattentive type: Secondary | ICD-10-CM

## 2024-01-18 ENCOUNTER — Other Ambulatory Visit (HOSPITAL_COMMUNITY): Payer: Self-pay

## 2024-01-18 MED ORDER — CLOPIDOGREL BISULFATE 75 MG PO TABS
75.0000 mg | ORAL_TABLET | Freq: Every day | ORAL | 2 refills | Status: AC
  Filled 2024-01-18 – 2024-03-21 (×2): qty 90, 90d supply, fill #0
  Filled 2024-06-16: qty 90, 90d supply, fill #1
  Filled 2024-09-10: qty 90, 90d supply, fill #2

## 2024-01-19 ENCOUNTER — Other Ambulatory Visit: Payer: Self-pay | Admitting: Internal Medicine

## 2024-01-19 ENCOUNTER — Other Ambulatory Visit (HOSPITAL_COMMUNITY): Payer: Self-pay

## 2024-01-19 ENCOUNTER — Encounter (HOSPITAL_COMMUNITY): Payer: Self-pay

## 2024-01-19 ENCOUNTER — Encounter: Payer: Self-pay | Admitting: Dermatology

## 2024-01-19 ENCOUNTER — Ambulatory Visit: Payer: PPO | Admitting: Dermatology

## 2024-01-19 VITALS — BP 119/79

## 2024-01-19 DIAGNOSIS — W908XXA Exposure to other nonionizing radiation, initial encounter: Secondary | ICD-10-CM | POA: Diagnosis not present

## 2024-01-19 DIAGNOSIS — L570 Actinic keratosis: Secondary | ICD-10-CM | POA: Diagnosis not present

## 2024-01-19 DIAGNOSIS — F900 Attention-deficit hyperactivity disorder, predominantly inattentive type: Secondary | ICD-10-CM

## 2024-01-19 DIAGNOSIS — L219 Seborrheic dermatitis, unspecified: Secondary | ICD-10-CM | POA: Diagnosis not present

## 2024-01-19 DIAGNOSIS — L853 Xerosis cutis: Secondary | ICD-10-CM

## 2024-01-19 MED ORDER — DULOXETINE HCL 30 MG PO CPEP
30.0000 mg | ORAL_CAPSULE | Freq: Every day | ORAL | 0 refills | Status: DC
  Filled 2024-01-19 (×2): qty 90, 90d supply, fill #0

## 2024-01-19 NOTE — Progress Notes (Signed)
   Follow-Up Visit   Subjective  Robert Mccall is a 73 y.o. male who presents for the following: AK f/u  Patient present today for follow up visit. Patient was last evaluated on 07/22/23 for AK & Seb Derm. At this visit patient was prescribed ketoconazole shampoo and cryo done on AKs. Patient reports sxs are better. Patient denies medication changes.  The following portions of the chart were reviewed this encounter and updated as appropriate: medications, allergies, medical history  Review of Systems:  No other skin or systemic complaints except as noted in HPI or Assessment and Plan.  Objective  Well appearing patient in no apparent distress; mood and affect are within normal limits.   A focused examination was performed of the following areas: face   Relevant exam findings are noted in the Assessment and Plan.  Left Forearm - Anterior (2), Left Zygomatic Area Erythematous thin papules/macules with gritty scale.   Assessment & Plan   SEBORRHEIC DERMATITIS Exam: Pink patches with greasy scale at scalp  controlled  Seborrheic Dermatitis is a chronic persistent rash characterized by pinkness and scaling most commonly of the mid face but also can occur on the scalp (dandruff), ears; mid chest, mid back and groin.  It tends to be exacerbated by stress and cooler weather.  People who have neurologic disease may experience new onset or exacerbation of existing seborrheic dermatitis.  The condition is not curable but treatable and can be controlled.  Treatment Plan:    ACTINIC KERATOSIS Exam: Erythematous thin papules/macules with gritty scale  Actinic keratoses are precancerous spots that appear secondary to cumulative UV radiation exposure/sun exposure over time. They are chronic with expected duration over 1 year. A portion of actinic keratoses will progress to squamous cell carcinoma of the skin. It is not possible to reliably predict which spots will progress to skin cancer  and so treatment is recommended to prevent development of skin cancer.  Recommend daily broad spectrum sunscreen SPF 30+ to sun-exposed areas, reapply every 2 hours as needed.  Recommend staying in the shade or wearing long sleeves, sun glasses (UVA+UVB protection) and wide brim hats (4-inch brim around the entire circumference of the hat). Call for new or changing lesions.  Xerosis - diffuse xerotic patches - recommend gentle, hydrating skin care - gentle skin care handout given    AK (ACTINIC KERATOSIS) (3) Left Forearm - Anterior (2), Left Zygomatic Area Destruction of lesion - Left Forearm - Anterior (2), Left Zygomatic Area Complexity: simple   Destruction method: cryotherapy   Informed consent: discussed and consent obtained   Timeout:  patient name, date of birth, surgical site, and procedure verified Lesion destroyed using liquid nitrogen: Yes   Post-procedure details: wound care instructions given    No follow-ups on file.    Documentation: I have reviewed the above documentation for accuracy and completeness, and I agree with the above.  I, Shirron Marcha Solders, CMA, am acting as scribe for [employer], DO.   Langston Reusing, DO

## 2024-01-19 NOTE — Patient Instructions (Addendum)

## 2024-01-20 ENCOUNTER — Other Ambulatory Visit: Payer: Self-pay

## 2024-01-20 ENCOUNTER — Encounter: Payer: Self-pay | Admitting: Internal Medicine

## 2024-01-20 DIAGNOSIS — F5104 Psychophysiologic insomnia: Secondary | ICD-10-CM

## 2024-01-20 DIAGNOSIS — F900 Attention-deficit hyperactivity disorder, predominantly inattentive type: Secondary | ICD-10-CM

## 2024-01-21 ENCOUNTER — Other Ambulatory Visit (HOSPITAL_COMMUNITY): Payer: Self-pay

## 2024-02-10 ENCOUNTER — Other Ambulatory Visit (HOSPITAL_COMMUNITY): Payer: Self-pay

## 2024-02-10 ENCOUNTER — Encounter: Payer: Self-pay | Admitting: Internal Medicine

## 2024-02-10 ENCOUNTER — Ambulatory Visit: Payer: PPO | Admitting: Internal Medicine

## 2024-02-10 ENCOUNTER — Other Ambulatory Visit: Payer: Self-pay

## 2024-02-10 VITALS — BP 124/72 | HR 78 | Temp 98.6°F | Resp 16 | Ht 64.0 in | Wt 172.0 lb

## 2024-02-10 DIAGNOSIS — F331 Major depressive disorder, recurrent, moderate: Secondary | ICD-10-CM

## 2024-02-10 DIAGNOSIS — I2510 Atherosclerotic heart disease of native coronary artery without angina pectoris: Secondary | ICD-10-CM

## 2024-02-10 DIAGNOSIS — R351 Nocturia: Secondary | ICD-10-CM

## 2024-02-10 DIAGNOSIS — K7581 Nonalcoholic steatohepatitis (NASH): Secondary | ICD-10-CM

## 2024-02-10 DIAGNOSIS — N401 Enlarged prostate with lower urinary tract symptoms: Secondary | ICD-10-CM | POA: Diagnosis not present

## 2024-02-10 DIAGNOSIS — N1831 Chronic kidney disease, stage 3a: Secondary | ICD-10-CM

## 2024-02-10 DIAGNOSIS — I10 Essential (primary) hypertension: Secondary | ICD-10-CM

## 2024-02-10 DIAGNOSIS — F5104 Psychophysiologic insomnia: Secondary | ICD-10-CM | POA: Diagnosis not present

## 2024-02-10 DIAGNOSIS — I25119 Atherosclerotic heart disease of native coronary artery with unspecified angina pectoris: Secondary | ICD-10-CM | POA: Diagnosis not present

## 2024-02-10 DIAGNOSIS — E785 Hyperlipidemia, unspecified: Secondary | ICD-10-CM | POA: Diagnosis not present

## 2024-02-10 DIAGNOSIS — E538 Deficiency of other specified B group vitamins: Secondary | ICD-10-CM

## 2024-02-10 DIAGNOSIS — E118 Type 2 diabetes mellitus with unspecified complications: Secondary | ICD-10-CM | POA: Diagnosis not present

## 2024-02-10 DIAGNOSIS — K219 Gastro-esophageal reflux disease without esophagitis: Secondary | ICD-10-CM

## 2024-02-10 DIAGNOSIS — Z0000 Encounter for general adult medical examination without abnormal findings: Secondary | ICD-10-CM | POA: Diagnosis not present

## 2024-02-10 DIAGNOSIS — E781 Pure hyperglyceridemia: Secondary | ICD-10-CM | POA: Diagnosis not present

## 2024-02-10 DIAGNOSIS — Z0001 Encounter for general adult medical examination with abnormal findings: Secondary | ICD-10-CM

## 2024-02-10 DIAGNOSIS — Z7984 Long term (current) use of oral hypoglycemic drugs: Secondary | ICD-10-CM

## 2024-02-10 LAB — HEPATIC FUNCTION PANEL
ALT: 26 U/L (ref 0–53)
AST: 31 U/L (ref 0–37)
Albumin: 4.6 g/dL (ref 3.5–5.2)
Alkaline Phosphatase: 56 U/L (ref 39–117)
Bilirubin, Direct: 0.1 mg/dL (ref 0.0–0.3)
Total Bilirubin: 0.5 mg/dL (ref 0.2–1.2)
Total Protein: 7.2 g/dL (ref 6.0–8.3)

## 2024-02-10 LAB — CBC WITH DIFFERENTIAL/PLATELET
Basophils Absolute: 0 10*3/uL (ref 0.0–0.1)
Basophils Relative: 0.3 % (ref 0.0–3.0)
Eosinophils Absolute: 0.1 10*3/uL (ref 0.0–0.7)
Eosinophils Relative: 1.3 % (ref 0.0–5.0)
HCT: 44.4 % (ref 39.0–52.0)
Hemoglobin: 14.8 g/dL (ref 13.0–17.0)
Lymphocytes Relative: 34.5 % (ref 12.0–46.0)
Lymphs Abs: 2.9 10*3/uL (ref 0.7–4.0)
MCHC: 33.3 g/dL (ref 30.0–36.0)
MCV: 91 fl (ref 78.0–100.0)
Monocytes Absolute: 1 10*3/uL (ref 0.1–1.0)
Monocytes Relative: 11.4 % (ref 3.0–12.0)
Neutro Abs: 4.4 10*3/uL (ref 1.4–7.7)
Neutrophils Relative %: 52.5 % (ref 43.0–77.0)
Platelets: 251 10*3/uL (ref 150.0–400.0)
RBC: 4.88 Mil/uL (ref 4.22–5.81)
RDW: 13.7 % (ref 11.5–15.5)
WBC: 8.4 10*3/uL (ref 4.0–10.5)

## 2024-02-10 LAB — BASIC METABOLIC PANEL
BUN: 15 mg/dL (ref 6–23)
CO2: 27 meq/L (ref 19–32)
Calcium: 9.4 mg/dL (ref 8.4–10.5)
Chloride: 102 meq/L (ref 96–112)
Creatinine, Ser: 1.15 mg/dL (ref 0.40–1.50)
GFR: 63.41 mL/min (ref >60.00)
Glucose, Bld: 90 mg/dL (ref 70–99)
Potassium: 4.5 meq/L (ref 3.5–5.1)
Sodium: 136 meq/L (ref 135–145)

## 2024-02-10 LAB — LIPID PANEL
Cholesterol: 112 mg/dL (ref 0–200)
HDL: 42 mg/dL (ref >39.00)
LDL Cholesterol: 37 mg/dL (ref 0–99)
NonHDL: 70.05
Total CHOL/HDL Ratio: 3
Triglycerides: 164 mg/dL — ABNORMAL HIGH (ref 0.0–149.0)
VLDL: 32.8 mg/dL (ref 0.0–40.0)

## 2024-02-10 LAB — FOLATE: Folate: >25.2 ng/mL (ref >5.9)

## 2024-02-10 LAB — PSA: PSA: 0.24 ng/mL (ref 0.10–4.00)

## 2024-02-10 LAB — MICROALBUMIN / CREATININE URINE RATIO
Creatinine,U: 143.3 mg/dL
Microalb Creat Ratio: 17.2 mg/g (ref 0.0–30.0)
Microalb, Ur: 2.5 mg/dL — ABNORMAL HIGH (ref 0.0–1.9)

## 2024-02-10 LAB — VITAMIN B12: Vitamin B-12: 500 pg/mL (ref 211–911)

## 2024-02-10 LAB — TSH: TSH: 1.86 u[IU]/mL (ref 0.35–5.50)

## 2024-02-10 LAB — HEMOGLOBIN A1C: Hgb A1c MFr Bld: 5.9 % (ref 4.6–6.5)

## 2024-02-10 MED ORDER — PANTOPRAZOLE SODIUM 40 MG PO TBEC
40.0000 mg | DELAYED_RELEASE_TABLET | Freq: Every day | ORAL | 0 refills | Status: DC
  Filled 2024-02-10: qty 90, 90d supply, fill #0

## 2024-02-10 MED ORDER — OLMESARTAN MEDOXOMIL 40 MG PO TABS
40.0000 mg | ORAL_TABLET | Freq: Every day | ORAL | 0 refills | Status: DC
Start: 2024-02-10 — End: 2024-05-04
  Filled 2024-02-10: qty 90, 90d supply, fill #0

## 2024-02-10 MED ORDER — ROSUVASTATIN CALCIUM 20 MG PO TABS
20.0000 mg | ORAL_TABLET | Freq: Every day | ORAL | 0 refills | Status: DC
  Filled 2024-02-10: qty 90, 90d supply, fill #0

## 2024-02-10 MED ORDER — LAMOTRIGINE 25 MG PO TABS
25.0000 mg | ORAL_TABLET | Freq: Two times a day (BID) | ORAL | 0 refills | Status: DC
  Filled 2024-02-10: qty 180, 90d supply, fill #0

## 2024-02-10 MED ORDER — FAMOTIDINE 20 MG PO TABS
20.0000 mg | ORAL_TABLET | Freq: Every day | ORAL | 0 refills | Status: DC
  Filled 2024-02-10: qty 90, 90d supply, fill #0

## 2024-02-10 MED ORDER — ESZOPICLONE 2 MG PO TABS
2.0000 mg | ORAL_TABLET | Freq: Every evening | ORAL | 0 refills | Status: DC | PRN
  Filled 2024-02-10: qty 90, 90d supply, fill #0

## 2024-02-10 NOTE — Progress Notes (Signed)
 Subjective:  Patient ID: Robert Mccall, male    DOB: January 03, 1951  Age: 73 y.o. MRN: [id number]  CC: Annual Exam, Hyperlipidemia, Coronary Artery Disease, and Diabetes   HPI Robert Mccall presents for a CPX and f/up -----  Discussed the use of AI scribe software for clinical note transcription with the patient, who gave verbal consent to proceed.  History of Present Illness   Robert Mccall is a 73 year old male who presents for a head to toe checkup.  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 He does not engage in additional exercise. He experiences fatigue, which he attributes to his age. No chest pain, shortness of breath, dizziness, or lightheadedness.  He has chronic back pain that has been present for a long time. No chest pain or shortness of breath during physical activity.  He experiences difficulty initiating urination and occasional leakage when thinking about urinating. He typically urinates once per night and has to strain to completely empty his bladder. No painful or bloody urination.  He underwent cataract and glaucoma surgeries in February of the previous year and had a follow-up eye exam approximately two months ago. He reports no signs of diabetic eye damage.       Outpatient Medications Prior to Visit  Medication Sig Dispense Refill   alfuzosin (UROXATRAL) 10 MG 24 hr tablet Take 1 tablet (10 mg total) by mouth daily with breakfast. 90 tablet 0   Cholecalciferol (VITAMIN D) 50 MCG (2000 UT) CAPS Take 1 capsule (2,000 Units total) by mouth daily. 30 capsule 11   clopidogrel (PLAVIX) 75 MG tablet Take 1 tablet (75 mg total) by mouth daily. 90 tablet 2   Docusate Sodium 100 MG capsule Take 100 mg by mouth 2 (two) times daily.     DULoxetine (CYMBALTA) 30 MG capsule Take 1 capsule (30 mg total) by mouth daily. 90 capsule 0   finasteride (PROSCAR) 5 MG tablet Take 1 tablet (5 mg total) by mouth  daily. 90 tablet 0   ketoconazole (NIZORAL) 2 % shampoo Apply 1 Application topically 2 (two) times a week. Allow to sit on Scalp for about 30 seconds to 1 minute 120 mL 6   methylphenidate (METADATE ER) 20 MG ER tablet TAKE ONE TABLET BY MOUTH ONCE A DAY 90 tablet 0   metoprolol tartrate (LOPRESSOR) 25 MG tablet Take 1.5 tablets (37.5 mg total) by mouth 2 (two) times daily. 270 tablet 0   Multiple Vitamins-Minerals (ICAPS AREDS 2 PO) Take by mouth.     nitroGLYCERIN (NITROSTAT) 0.4 MG SL tablet Place 1 tablet (0.4 mg total) under the tongue every 5 (five) minutes as needed for chest pain (Call 911 if 3 doses does not relieve pain.). 25 tablet 3   Probiotic Product (PROBIOTIC-10 PO) Take 1 capsule by mouth daily.     vitamin B-12 (CYANOCOBALAMIN) 1000 MCG tablet Take 1,000 mcg by mouth daily.     aspirin EC 81 MG tablet Take 1 tablet (81 mg total) by mouth daily. Swallow whole. 90 tablet 3   eszopiclone (LUNESTA) 2 MG TABS tablet Take 1 tablet (2 mg total) by mouth at bedtime as needed for sleep. Take immediately before bedtime 90 tablet 0   famotidine (PEPCID) 20 MG tablet Take 1 tablet (20 mg total) by mouth daily after supper 90 tablet 0   lamoTRIgine (LAMICTAL) 25 MG tablet Take 1 tablet (25 mg total) by mouth 2 (two) times  daily. 180 tablet 0   olmesartan (BENICAR) 40 MG tablet Take 1 tablet (40 mg total) by mouth daily. 90 tablet 0   pantoprazole (PROTONIX) 40 MG tablet Take 1 tablet (40 mg total) by mouth daily. Take 30-60 min before first meal of the day 90 tablet 0   rosuvastatin (CRESTOR) 20 MG tablet Take 1 tablet (20 mg total) by mouth daily. 90 tablet 0   Taurine 1000 MG CAPS Take 1 capsule by mouth daily in the afternoon.     No facility-administered medications prior to visit.    ROS Review of Systems  Constitutional:  Positive for fatigue and unexpected weight change (wt gain). Negative for appetite change, chills and diaphoresis.  HENT:  Positive for hearing loss. Negative for  sore throat and trouble swallowing.   Eyes: Negative.  Negative for visual disturbance.  Respiratory: Negative.  Negative for cough, chest tightness, shortness of breath and wheezing.   Cardiovascular:  Negative for chest pain, palpitations and leg swelling.  Gastrointestinal: Negative.  Negative for abdominal pain, constipation, diarrhea, nausea and vomiting.  Endocrine: Negative.   Genitourinary:  Positive for difficulty urinating. Negative for dysuria, flank pain, scrotal swelling and testicular pain.  Musculoskeletal:  Positive for back pain. Negative for arthralgias and myalgias.  Skin: Negative.   Neurological:  Positive for numbness.       Numbness in feet  Hematological:  Negative for adenopathy. Does not bruise/bleed easily.  Psychiatric/Behavioral:  Positive for confusion, decreased concentration and sleep disturbance. Negative for agitation, behavioral problems, dysphoric mood and suicidal ideas. The patient is not nervous/anxious.     Objective:  BP 124/72 (BP Location: Left Arm, Patient Position: Sitting)   Pulse 78   Temp 98.6 F (37 C) (Temporal)   Resp 16   Ht 5\' 4"  (1.626 m)   Wt 172 lb (78 kg)   SpO2 96%   BMI 29.52 kg/m   BP Readings from Last 3 Encounters:  02/10/24 124/72  01/19/24 119/79  09/21/23 130/70    Wt Readings from Last 3 Encounters:  02/10/24 172 lb (78 kg)  09/22/23 168 lb (76.2 kg)  09/21/23 168 lb (76.2 kg)    Physical Exam Vitals reviewed.  Constitutional:      Appearance: Normal appearance.  HENT:     Nose: Nose normal.     Mouth/Throat:     Mouth: Mucous membranes are moist.  Eyes:     General: No scleral icterus.    Conjunctiva/sclera: Conjunctivae normal.  Cardiovascular:     Rate and Rhythm: Normal rate and regular rhythm.     Heart sounds: Normal heart sounds, S1 normal and S2 normal.     No friction rub. No gallop.     Comments: EKG- NSR, 61 bpm No LVH, Q waves, or ST/T wave changes  Unchaged Pulmonary:     Effort:  Pulmonary effort is normal.     Breath sounds: No stridor. No wheezing, rhonchi or rales.  Abdominal:     General: Abdomen is flat.     Palpations: There is no mass.     Tenderness: There is no abdominal tenderness. There is no guarding.     Hernia: No hernia is present. There is no hernia in the left inguinal area or right inguinal area.  Genitourinary:    Pubic Area: No rash.      Penis: Normal and circumcised.      Testes: Normal.     Epididymis:     Right: Normal.  Left: Normal.     Prostate: Enlarged. Not tender and no nodules present.     Rectum: Normal. Guaiac result negative. No mass, tenderness, anal fissure, external hemorrhoid or internal hemorrhoid. Normal anal tone.  Musculoskeletal:     Cervical back: Neck supple.     Right lower leg: No edema.     Left lower leg: No edema.  Lymphadenopathy:     Cervical: No cervical adenopathy.     Lower Body: No right inguinal adenopathy. No left inguinal adenopathy.  Skin:    General: Skin is warm and dry.     Findings: No rash.  Neurological:     General: No focal deficit present.     Mental Status: He is alert. Mental status is at baseline.  Psychiatric:        Attention and Perception: Attention normal.        Mood and Affect: Mood normal.        Behavior: Behavior normal.     Lab Results  Component Value Date   WBC 8.4 02/10/2024   HGB 14.8 02/10/2024   HCT 44.4 02/10/2024   PLT 251.0 02/10/2024   GLUCOSE 90 02/10/2024   CHOL 112 02/10/2024   TRIG 164.0 (H) 02/10/2024   HDL 42.00 02/10/2024   LDLDIRECT 76.0 11/29/2019   LDLCALC 37 02/10/2024   ALT 26 02/10/2024   AST 31 02/10/2024   NA 136 02/10/2024   K 4.5 02/10/2024   CL 102 02/10/2024   CREATININE 1.15 02/10/2024   BUN 15 02/10/2024   CO2 27 02/10/2024   TSH 1.86 02/10/2024   PSA 0.24 02/10/2024   INR 1.04 01/23/2017   HGBA1C 5.9 02/10/2024   MICROALBUR 2.5 (H) 02/10/2024    CT Abdomen Pelvis W Contrast Result Date: 12/18/2022 CLINICAL DATA:   Right lower quadrant abdominal pain 4 3 months. Elevated lipase EXAM: CT ABDOMEN AND PELVIS WITH CONTRAST TECHNIQUE: Multidetector CT imaging of the abdomen and pelvis was performed using the standard protocol following bolus administration of intravenous contrast. RADIATION DOSE REDUCTION: This exam was performed according to the departmental dose-optimization program which includes automated exposure control, adjustment of the mA and/or kV according to patient size and/or use of iterative reconstruction technique. CONTRAST:  ISOVUE-300 IOPAMIDOL (ISOVUE-300) INJECTION 61% COMPARISON:  Acute abdominal series 12/17/2022. CT of the abdomen and pelvis without and with contrast 04/02/2014 FINDINGS: Lower chest: 3 mm nodule in the right lower lobe on image 8 of series 4 is stable overall most 9 years. Recommend no follow-up imaging. Lung bases are otherwise clear. The heart size is normal. No significant pleural or pericardial effusion is present. Hepatobiliary: Diffuse fatty infiltration liver is present. A stable 12 mm cyst is present in the left lobe. No other focal lesions are present. The common bile duct and gallbladder are within normal limits. Pancreas: Unremarkable. No pancreatic ductal dilatation or surrounding inflammatory changes. Spleen: Normal in size without focal abnormality. Adrenals/Urinary Tract: Adrenal glands are normal bilaterally. An 18 mm simple cyst projecting posteriorly from the right kidney demonstrates slight interval growth. Recommend no follow-up imaging. Scattered parenchymal thinning likely represents scarring from remote infection, stable. No stone or obstruction is present. Ureters are within normal limits. The urinary bladder is mostly collapsed. Stomach/Bowel: The stomach and duodenum are within normal limits. Small bowel is unremarkable. Terminal ileum is within normal limits. The appendix is visualized and normal. The ascending and transverse colon are within normal limits.  Diverticular changes are present within the descending and sigmoid colon.  No significant focal inflammatory changes are present to suggest acute diverticulitis. Some wall thickening in the sigmoid colon is mostly due to lack of distension without associated inflammatory change. Vascular/Lymphatic: Atherosclerotic calcifications are present in the aorta and branch vessels without aneurysm. No significant retroperitoneal adenopathy is present. Reproductive: Prostate gland and seminal vesicles are within normal limits. Other: No abdominal wall hernia or abnormality. No abdominopelvic ascites. Musculoskeletal: Degenerative changes are present lower lumbar spine with leftward curvature centered at L3-4. Asymmetric right-sided disease is present at L2-3, L3-4 and L4-5. Asymmetric left-sided disease is present at L5-S1. Bone island in the left S2 segment is stable over time. No other focal osseous lesions are present. The hips are located and within normal limits bilaterally. IMPRESSION: 1. No acute or focal lesion to explain the patient's symptoms. 2. Normal pancreas. 3. Normal appendix. 4. Descending and sigmoid diverticulosis without diverticulitis. 5. Hepatic steatosis. 6. Multilevel degenerative changes in the lower lumbar spine. 7.  Aortic Atherosclerosis (ICD10-I70.0). Electronically Signed   By: Marin Roberts M.D.   On: 12/18/2022 13:26    Assessment & Plan:   Type II diabetes mellitus with manifestations (HCC) -     Urinalysis, Routine w reflex microscopic; Future -     Hemoglobin A1c; Future -     Microalbumin / creatinine urine ratio; Future -     HM Diabetes Foot Exam -     Empagliflozin; Take 1 tablet (10 mg total) by mouth daily before breakfast.  Dispense: 90 tablet; Refill: 1  Coronary artery disease involving native coronary artery of native heart without angina pectoris -     Olmesartan Medoxomil; Take 1 tablet (40 mg total) by mouth daily.  Dispense: 90 tablet; Refill: 0 -     Lipid  panel; Future  Essential hypertension- BP is well controlled. -     Olmesartan Medoxomil; Take 1 tablet (40 mg total) by mouth daily.  Dispense: 90 tablet; Refill: 0 -     TSH; Future -     Urinalysis, Routine w reflex microscopic; Future -     Basic metabolic panel; Future -     EKG 12-Lead  Dyslipidemia, goal LDL below 70 - LDL goal achieved. Doing well on the statin  -     Rosuvastatin Calcium; Take 1 tablet (20 mg total) by mouth daily.  Dispense: 90 tablet; Refill: 0 -     Lipid panel; Future -     TSH; Future -     Hepatic function panel; Future  Coronary artery disease involving native coronary artery of native heart with angina pectoris (HCC) -     Rosuvastatin Calcium; Take 1 tablet (20 mg total) by mouth daily.  Dispense: 90 tablet; Refill: 0 -     Lipid panel; Future -     Aspirin; Take 1 tablet (81 mg total) by mouth daily. Swallow whole.  Dispense: 90 tablet; Refill: 1  Psychophysiological insomnia -     Eszopiclone; Take 1 tablet (2 mg total) by mouth at bedtime as needed for sleep. Take immediately before bedtime  Dispense: 90 tablet; Refill: 0  Moderate episode of recurrent major depressive disorder (HCC) -     lamoTRIgine; Take 1 tablet (25 mg total) by mouth 2 (two) times daily.  Dispense: 180 tablet; Refill: 0  LPRD (laryngopharyngeal reflux disease) -     Famotidine; Take 1 tablet (20 mg total) by mouth daily after supper  Dispense: 90 tablet; Refill: 0 -     Pantoprazole Sodium; Take 1  tablet (40 mg total) by mouth daily. Take 30-60 min before first meal of the day  Dispense: 90 tablet; Refill: 0  BPH associated with nocturia -     PSA; Future  B12 deficiency -     CBC with Differential/Platelet; Future -     Vitamin B12; Future -     Folate; Future  Pure hypertriglyceridemia -     Lipid panel; Future  Encounter for general adult medical examination with abnormal findings- Exam completed, labs reviewed, vaccines reviewed, cancer screenings addressed, pt  ed material was given.   NASH (nonalcoholic steatohepatitis) -     Hepatic function panel; Future  Stage 3a chronic kidney disease (HCC) -     Empagliflozin; Take 1 tablet (10 mg total) by mouth daily before breakfast.  Dispense: 90 tablet; Refill: 1     Follow-up: Return in about 6 months (around 08/12/2024).  Sanda Linger, MD

## 2024-02-10 NOTE — Patient Instructions (Signed)
 Health Maintenance, Male  Adopting a healthy lifestyle and getting preventive care are important in promoting health and wellness. Ask your health care provider about:  The right schedule for you to have regular tests and exams.  Things you can do on your own to prevent diseases and keep yourself healthy.  What should I know about diet, weight, and exercise?  Eat a healthy diet    Eat a diet that includes plenty of vegetables, fruits, low-fat dairy products, and lean protein.  Do not eat a lot of foods that are high in solid fats, added sugars, or sodium.  Maintain a healthy weight  Body mass index (BMI) is a measurement that can be used to identify possible weight problems. It estimates body fat based on height and weight. Your health care provider can help determine your BMI and help you achieve or maintain a healthy weight.  Get regular exercise  Get regular exercise. This is one of the most important things you can do for your health. Most adults should:  Exercise for at least 150 minutes each week. The exercise should increase your heart rate and make you sweat (moderate-intensity exercise).  Do strengthening exercises at least twice a week. This is in addition to the moderate-intensity exercise.  Spend less time sitting. Even light physical activity can be beneficial.  Watch cholesterol and blood lipids  Have your blood tested for lipids and cholesterol at 73 years of age, then have this test every 5 years.  You may need to have your cholesterol levels checked more often if:  Your lipid or cholesterol levels are high.  You are older than 73 years of age.  You are at high risk for heart disease.  What should I know about cancer screening?  Many types of cancers can be detected early and may often be prevented. Depending on your health history and family history, you may need to have cancer screening at various ages. This may include screening for:  Colorectal cancer.  Prostate cancer.  Skin cancer.  Lung  cancer.  What should I know about heart disease, diabetes, and high blood pressure?  Blood pressure and heart disease  High blood pressure causes heart disease and increases the risk of stroke. This is more likely to develop in people who have high blood pressure readings or are overweight.  Talk with your health care provider about your target blood pressure readings.  Have your blood pressure checked:  Every 3-5 years if you are 73-73 years of age.  Every year if you are 73 years old or older.  If you are between the ages of 29 and 29 and are a current or former smoker, ask your health care provider if you should have a one-time screening for abdominal aortic aneurysm (AAA).  Diabetes  Have regular diabetes screenings. This checks your fasting blood sugar level. Have the screening done:  Once every three years after age 23 if you are at a normal weight and have a low risk for diabetes.  More often and at a younger age if you are overweight or have a high risk for diabetes.  What should I know about preventing infection?  Hepatitis B  If you have a higher risk for hepatitis B, you should be screened for this virus. Talk with your health care provider to find out if you are at risk for hepatitis B infection.  Hepatitis C  Blood testing is recommended for:  Everyone born from [birth year] through 1965.  Anyone  with known risk factors for hepatitis C.  Sexually transmitted infections (STIs)  You should be screened each year for STIs, including gonorrhea and chlamydia, if:  You are sexually active and are younger than 73 years of age.  You are older than 73 years of age and your health care provider tells you that you are at risk for this type of infection.  Your sexual activity has changed since you were last screened, and you are at increased risk for chlamydia or gonorrhea. Ask your health care provider if you are at risk.  Ask your health care provider about whether you are at high risk for HIV. Your health care provider  may recommend a prescription medicine to help prevent HIV infection. If you choose to take medicine to prevent HIV, you should first get tested for HIV. You should then be tested every 3 months for as long as you are taking the medicine.  Follow these instructions at home:  Alcohol use  Do not drink alcohol if your health care provider tells you not to drink.  If you drink alcohol:  Limit how much you have to 0-2 drinks a day.  Know how much alcohol is in your drink. In the [place] one drink equals one 12 oz bottle of beer (355 mL), one 5 oz glass of wine (148 mL), or one 1 oz glass of hard liquor (44 mL).  Lifestyle  Do not use any products that contain nicotine or tobacco. These products include cigarettes, chewing tobacco, and vaping devices, such as e-cigarettes. If you need help quitting, ask your health care provider.  Do not use street drugs.  Do not share needles.  Ask your health care provider for help if you need support or information about quitting drugs.  General instructions  Schedule regular health, dental, and eye exams.  Stay current with your vaccines.  Tell your health care provider if:  You often feel depressed.  You have ever been abused or do not feel safe at home.  Summary  Adopting a healthy lifestyle and getting preventive care are important in promoting health and wellness.  Follow your health care provider's instructions about healthy diet, exercising, and getting tested or screened for diseases.  Follow your health care provider's instructions on monitoring your cholesterol and blood pressure.  This information is not intended to replace advice given to you by your health care provider. Make sure you discuss any questions you have with your health care provider.  Document Revised: 04/14/2021 Document Reviewed: 04/14/2021  Elsevier Patient Education  2024 [employer].

## 2024-02-11 ENCOUNTER — Other Ambulatory Visit (HOSPITAL_COMMUNITY): Payer: Self-pay

## 2024-02-11 ENCOUNTER — Encounter: Payer: Self-pay | Admitting: Internal Medicine

## 2024-02-11 DIAGNOSIS — N1831 Chronic kidney disease, stage 3a: Secondary | ICD-10-CM | POA: Insufficient documentation

## 2024-02-11 LAB — URINALYSIS, ROUTINE W REFLEX MICROSCOPIC
Bilirubin Urine: NEGATIVE
Hgb urine dipstick: NEGATIVE
Ketones, ur: NEGATIVE
Leukocytes,Ua: NEGATIVE
Nitrite: NEGATIVE
Specific Gravity, Urine: >=1.03 — AB (ref 1.000–1.030)
Total Protein, Urine: NEGATIVE
Urine Glucose: NEGATIVE
Urobilinogen, UA: 0.2 (ref 0.0–1.0)
pH: 5.5 (ref 5.0–8.0)

## 2024-02-11 MED ORDER — EMPAGLIFLOZIN 10 MG PO TABS
10.0000 mg | ORAL_TABLET | Freq: Every day | ORAL | 1 refills | Status: DC
  Filled 2024-02-11: qty 90, 90d supply, fill #0
  Filled 2024-05-04 – 2024-05-08 (×2): qty 90, 90d supply, fill #1

## 2024-02-11 MED ORDER — ASPIRIN 81 MG PO TBEC
81.0000 mg | DELAYED_RELEASE_TABLET | Freq: Every day | ORAL | 1 refills | Status: DC
  Filled 2024-02-11: qty 90, 90d supply, fill #0
  Filled 2024-05-04: qty 90, 90d supply, fill #1

## 2024-02-15 ENCOUNTER — Other Ambulatory Visit (HOSPITAL_COMMUNITY): Payer: Self-pay

## 2024-03-01 ENCOUNTER — Other Ambulatory Visit: Payer: Self-pay

## 2024-03-01 ENCOUNTER — Encounter: Payer: Self-pay | Admitting: Internal Medicine

## 2024-03-01 DIAGNOSIS — F900 Attention-deficit hyperactivity disorder, predominantly inattentive type: Secondary | ICD-10-CM

## 2024-03-02 MED ORDER — METHYLPHENIDATE HCL ER 20 MG PO TBCR
20.0000 mg | EXTENDED_RELEASE_TABLET | Freq: Every day | ORAL | 0 refills | Status: DC
Start: 2024-03-02 — End: 2024-05-10

## 2024-03-06 ENCOUNTER — Encounter: Payer: Self-pay | Admitting: Internal Medicine

## 2024-03-08 ENCOUNTER — Other Ambulatory Visit: Payer: Self-pay | Admitting: Internal Medicine

## 2024-03-08 ENCOUNTER — Other Ambulatory Visit (HOSPITAL_COMMUNITY): Payer: Self-pay

## 2024-03-08 DIAGNOSIS — I2510 Atherosclerotic heart disease of native coronary artery without angina pectoris: Secondary | ICD-10-CM

## 2024-03-08 MED ORDER — METOPROLOL TARTRATE 25 MG PO TABS
37.5000 mg | ORAL_TABLET | Freq: Two times a day (BID) | ORAL | 0 refills | Status: DC
  Filled 2024-03-08: qty 270, 90d supply, fill #0

## 2024-03-09 ENCOUNTER — Other Ambulatory Visit (HOSPITAL_COMMUNITY): Payer: Self-pay

## 2024-03-09 ENCOUNTER — Other Ambulatory Visit: Payer: Self-pay

## 2024-03-09 DIAGNOSIS — N401 Enlarged prostate with lower urinary tract symptoms: Secondary | ICD-10-CM

## 2024-03-09 MED ORDER — ALFUZOSIN HCL ER 10 MG PO TB24
10.0000 mg | ORAL_TABLET | Freq: Every day | ORAL | 1 refills | Status: DC
  Filled 2024-03-09: qty 90, 90d supply, fill #0
  Filled 2024-06-01: qty 90, 90d supply, fill #1

## 2024-03-09 MED ORDER — FINASTERIDE 5 MG PO TABS
5.0000 mg | ORAL_TABLET | Freq: Every day | ORAL | 1 refills | Status: DC
  Filled 2024-03-09: qty 90, 90d supply, fill #0
  Filled 2024-06-01: qty 90, 90d supply, fill #1

## 2024-03-21 ENCOUNTER — Other Ambulatory Visit (HOSPITAL_COMMUNITY): Payer: Self-pay

## 2024-04-18 ENCOUNTER — Other Ambulatory Visit (HOSPITAL_COMMUNITY): Payer: Self-pay

## 2024-04-18 ENCOUNTER — Other Ambulatory Visit: Payer: Self-pay

## 2024-04-18 ENCOUNTER — Other Ambulatory Visit: Payer: Self-pay | Admitting: Internal Medicine

## 2024-04-18 DIAGNOSIS — F900 Attention-deficit hyperactivity disorder, predominantly inattentive type: Secondary | ICD-10-CM

## 2024-04-18 MED ORDER — DULOXETINE HCL 30 MG PO CPEP
30.0000 mg | ORAL_CAPSULE | Freq: Every day | ORAL | 0 refills | Status: DC
Start: 2024-04-18 — End: 2024-07-17
  Filled 2024-04-18: qty 90, 90d supply, fill #0

## 2024-05-04 ENCOUNTER — Other Ambulatory Visit: Payer: Self-pay | Admitting: Internal Medicine

## 2024-05-04 DIAGNOSIS — I10 Essential (primary) hypertension: Secondary | ICD-10-CM

## 2024-05-04 DIAGNOSIS — F331 Major depressive disorder, recurrent, moderate: Secondary | ICD-10-CM

## 2024-05-04 DIAGNOSIS — I25119 Atherosclerotic heart disease of native coronary artery with unspecified angina pectoris: Secondary | ICD-10-CM

## 2024-05-04 DIAGNOSIS — K219 Gastro-esophageal reflux disease without esophagitis: Secondary | ICD-10-CM

## 2024-05-04 DIAGNOSIS — E785 Hyperlipidemia, unspecified: Secondary | ICD-10-CM

## 2024-05-04 DIAGNOSIS — I2510 Atherosclerotic heart disease of native coronary artery without angina pectoris: Secondary | ICD-10-CM

## 2024-05-05 ENCOUNTER — Other Ambulatory Visit (HOSPITAL_COMMUNITY): Payer: Self-pay

## 2024-05-05 ENCOUNTER — Other Ambulatory Visit: Payer: Self-pay

## 2024-05-05 MED ORDER — PANTOPRAZOLE SODIUM 40 MG PO TBEC
40.0000 mg | DELAYED_RELEASE_TABLET | Freq: Every day | ORAL | 0 refills | Status: DC
  Filled 2024-05-05 – 2024-05-08 (×2): qty 90, 90d supply, fill #0

## 2024-05-05 MED ORDER — OLMESARTAN MEDOXOMIL 40 MG PO TABS
40.0000 mg | ORAL_TABLET | Freq: Every day | ORAL | 0 refills | Status: DC
  Filled 2024-05-05 – 2024-05-08 (×2): qty 90, 90d supply, fill #0

## 2024-05-05 MED ORDER — LAMOTRIGINE 25 MG PO TABS
25.0000 mg | ORAL_TABLET | Freq: Two times a day (BID) | ORAL | 0 refills | Status: DC
  Filled 2024-05-05 – 2024-05-08 (×2): qty 180, 90d supply, fill #0

## 2024-05-05 MED ORDER — ROSUVASTATIN CALCIUM 20 MG PO TABS
20.0000 mg | ORAL_TABLET | Freq: Every day | ORAL | 0 refills | Status: DC
Start: 2024-05-05 — End: 2024-08-02
  Filled 2024-05-05 – 2024-05-08 (×3): qty 90, 90d supply, fill #0

## 2024-05-05 MED ORDER — FAMOTIDINE 20 MG PO TABS
20.0000 mg | ORAL_TABLET | Freq: Every day | ORAL | 0 refills | Status: DC
Start: 2024-05-05 — End: 2024-08-07
  Filled 2024-05-05 – 2024-05-08 (×2): qty 90, 90d supply, fill #0

## 2024-05-07 ENCOUNTER — Encounter (HOSPITAL_COMMUNITY): Payer: Self-pay

## 2024-05-08 ENCOUNTER — Other Ambulatory Visit (HOSPITAL_COMMUNITY): Payer: Self-pay

## 2024-05-08 ENCOUNTER — Other Ambulatory Visit: Payer: Self-pay

## 2024-05-10 ENCOUNTER — Other Ambulatory Visit: Payer: Self-pay | Admitting: Internal Medicine

## 2024-05-10 ENCOUNTER — Encounter: Payer: Self-pay | Admitting: Internal Medicine

## 2024-05-10 DIAGNOSIS — F900 Attention-deficit hyperactivity disorder, predominantly inattentive type: Secondary | ICD-10-CM

## 2024-05-10 MED ORDER — METHYLPHENIDATE HCL ER 20 MG PO TBCR: 20.0000 mg | EXTENDED_RELEASE_TABLET | Freq: Every day | ORAL | 0 refills | Status: DC

## 2024-05-11 ENCOUNTER — Other Ambulatory Visit: Payer: Self-pay | Admitting: Internal Medicine

## 2024-05-11 DIAGNOSIS — F5104 Psychophysiologic insomnia: Secondary | ICD-10-CM

## 2024-05-15 ENCOUNTER — Other Ambulatory Visit (HOSPITAL_COMMUNITY): Payer: Self-pay

## 2024-05-15 MED ORDER — ESZOPICLONE 2 MG PO TABS
2.0000 mg | ORAL_TABLET | Freq: Every evening | ORAL | 0 refills | Status: DC | PRN
  Filled 2024-05-15: qty 90, 90d supply, fill #0

## 2024-05-16 ENCOUNTER — Other Ambulatory Visit (HOSPITAL_COMMUNITY)
Admission: RE | Admit: 2024-05-16 | Discharge: 2024-05-16 | Disposition: A | Discharge status: Home / Self Care | Payer: Self-pay | Source: Ambulatory Visit | Attending: Medical Genetics | Admitting: Medical Genetics

## 2024-05-16 DIAGNOSIS — Z006 Encounter for examination for normal comparison and control in clinical research program: Secondary | ICD-10-CM | POA: Insufficient documentation

## 2024-05-18 ENCOUNTER — Encounter (INDEPENDENT_AMBULATORY_CARE_PROVIDER_SITE_OTHER): Payer: Self-pay

## 2024-05-24 LAB — GENECONNECT MOLECULAR SCREEN: Genetic Analysis Overall Interpretation: NEGATIVE

## 2024-05-28 ENCOUNTER — Other Ambulatory Visit: Payer: Self-pay | Admitting: Internal Medicine

## 2024-05-28 DIAGNOSIS — I2510 Atherosclerotic heart disease of native coronary artery without angina pectoris: Secondary | ICD-10-CM

## 2024-05-29 ENCOUNTER — Encounter: Payer: Self-pay | Admitting: Internal Medicine

## 2024-05-30 ENCOUNTER — Other Ambulatory Visit: Payer: Self-pay

## 2024-05-30 DIAGNOSIS — F900 Attention-deficit hyperactivity disorder, predominantly inattentive type: Secondary | ICD-10-CM

## 2024-05-30 MED ORDER — METHYLPHENIDATE HCL ER 20 MG PO TBCR: 20.0000 mg | EXTENDED_RELEASE_TABLET | Freq: Every day | ORAL | 0 refills | Status: DC

## 2024-05-31 ENCOUNTER — Other Ambulatory Visit (HOSPITAL_COMMUNITY): Payer: Self-pay

## 2024-05-31 MED ORDER — METOPROLOL TARTRATE 25 MG PO TABS
37.5000 mg | ORAL_TABLET | Freq: Two times a day (BID) | ORAL | 0 refills | Status: DC
  Filled 2024-05-31: qty 270, 90d supply, fill #0

## 2024-06-01 ENCOUNTER — Other Ambulatory Visit (HOSPITAL_COMMUNITY): Payer: Self-pay

## 2024-06-01 ENCOUNTER — Other Ambulatory Visit: Payer: Self-pay

## 2024-06-13 DIAGNOSIS — H43813 Vitreous degeneration, bilateral: Secondary | ICD-10-CM | POA: Diagnosis not present

## 2024-06-13 DIAGNOSIS — H3530 Unspecified macular degeneration: Secondary | ICD-10-CM | POA: Diagnosis not present

## 2024-06-13 DIAGNOSIS — H401131 Primary open-angle glaucoma, bilateral, mild stage: Secondary | ICD-10-CM | POA: Diagnosis not present

## 2024-06-13 DIAGNOSIS — H35372 Puckering of macula, left eye: Secondary | ICD-10-CM | POA: Diagnosis not present

## 2024-06-13 DIAGNOSIS — Z961 Presence of intraocular lens: Secondary | ICD-10-CM | POA: Diagnosis not present

## 2024-06-14 ENCOUNTER — Other Ambulatory Visit (HOSPITAL_COMMUNITY): Payer: Self-pay

## 2024-06-14 MED ORDER — PREDNISOLONE ACETATE 1 % OP SUSP
1.0000 [drp] | Freq: Four times a day (QID) | OPHTHALMIC | 0 refills | Status: AC
  Filled 2024-06-14: qty 10, 50d supply, fill #0

## 2024-07-17 ENCOUNTER — Other Ambulatory Visit (HOSPITAL_BASED_OUTPATIENT_CLINIC_OR_DEPARTMENT_OTHER): Payer: Self-pay

## 2024-07-17 ENCOUNTER — Other Ambulatory Visit: Payer: Self-pay

## 2024-07-17 ENCOUNTER — Other Ambulatory Visit: Payer: Self-pay | Admitting: Internal Medicine

## 2024-07-17 DIAGNOSIS — F900 Attention-deficit hyperactivity disorder, predominantly inattentive type: Secondary | ICD-10-CM

## 2024-07-17 MED ORDER — DULOXETINE HCL 30 MG PO CPEP
30.0000 mg | ORAL_CAPSULE | Freq: Every day | ORAL | 0 refills | Status: DC
  Filled 2024-07-17: qty 90, 90d supply, fill #0

## 2024-07-24 ENCOUNTER — Other Ambulatory Visit: Payer: Self-pay | Admitting: Internal Medicine

## 2024-07-24 DIAGNOSIS — N1831 Chronic kidney disease, stage 3a: Secondary | ICD-10-CM

## 2024-07-24 DIAGNOSIS — E118 Type 2 diabetes mellitus with unspecified complications: Secondary | ICD-10-CM

## 2024-07-24 DIAGNOSIS — I10 Essential (primary) hypertension: Secondary | ICD-10-CM

## 2024-07-24 DIAGNOSIS — F331 Major depressive disorder, recurrent, moderate: Secondary | ICD-10-CM

## 2024-07-24 DIAGNOSIS — F5104 Psychophysiologic insomnia: Secondary | ICD-10-CM

## 2024-07-24 DIAGNOSIS — K219 Gastro-esophageal reflux disease without esophagitis: Secondary | ICD-10-CM

## 2024-07-24 DIAGNOSIS — I2510 Atherosclerotic heart disease of native coronary artery without angina pectoris: Secondary | ICD-10-CM

## 2024-07-26 ENCOUNTER — Other Ambulatory Visit (HOSPITAL_COMMUNITY): Payer: Self-pay

## 2024-07-26 ENCOUNTER — Encounter (HOSPITAL_COMMUNITY): Payer: Self-pay

## 2024-08-02 ENCOUNTER — Other Ambulatory Visit: Payer: Self-pay | Admitting: Internal Medicine

## 2024-08-02 DIAGNOSIS — E785 Hyperlipidemia, unspecified: Secondary | ICD-10-CM

## 2024-08-02 DIAGNOSIS — I25119 Atherosclerotic heart disease of native coronary artery with unspecified angina pectoris: Secondary | ICD-10-CM

## 2024-08-03 ENCOUNTER — Other Ambulatory Visit: Payer: Self-pay

## 2024-08-03 ENCOUNTER — Other Ambulatory Visit (HOSPITAL_COMMUNITY): Payer: Self-pay

## 2024-08-03 MED ORDER — ROSUVASTATIN CALCIUM 20 MG PO TABS
20.0000 mg | ORAL_TABLET | Freq: Every day | ORAL | 0 refills | Status: DC
  Filled 2024-08-03: qty 90, 90d supply, fill #0

## 2024-08-07 ENCOUNTER — Other Ambulatory Visit: Payer: Self-pay | Admitting: Internal Medicine

## 2024-08-07 DIAGNOSIS — E118 Type 2 diabetes mellitus with unspecified complications: Secondary | ICD-10-CM

## 2024-08-07 DIAGNOSIS — F331 Major depressive disorder, recurrent, moderate: Secondary | ICD-10-CM

## 2024-08-07 DIAGNOSIS — N1831 Chronic kidney disease, stage 3a: Secondary | ICD-10-CM

## 2024-08-07 DIAGNOSIS — K219 Gastro-esophageal reflux disease without esophagitis: Secondary | ICD-10-CM

## 2024-08-07 DIAGNOSIS — I2510 Atherosclerotic heart disease of native coronary artery without angina pectoris: Secondary | ICD-10-CM

## 2024-08-07 DIAGNOSIS — I10 Essential (primary) hypertension: Secondary | ICD-10-CM

## 2024-08-11 ENCOUNTER — Telehealth: Payer: Self-pay | Admitting: Radiology

## 2024-08-11 ENCOUNTER — Encounter: Payer: Self-pay | Admitting: Internal Medicine

## 2024-08-11 ENCOUNTER — Other Ambulatory Visit: Payer: Self-pay | Admitting: Internal Medicine

## 2024-08-11 ENCOUNTER — Other Ambulatory Visit (HOSPITAL_COMMUNITY): Payer: Self-pay

## 2024-08-11 DIAGNOSIS — F5104 Psychophysiologic insomnia: Secondary | ICD-10-CM

## 2024-08-11 MED ORDER — ESZOPICLONE 2 MG PO TABS
2.0000 mg | ORAL_TABLET | Freq: Every evening | ORAL | 0 refills | Status: DC | PRN
  Filled 2024-08-11: qty 90, 90d supply, fill #0

## 2024-08-11 NOTE — Telephone Encounter (Signed)
 Copied from CRM [id number]. Topic: Clinical - Prescription Issue >> Aug 11, 2024  2:49 PM Sasha M wrote: Reason for CRM: Pt returning call to [facility], states that pharmacy is stating that prescription is still waiting for provider approval pending an appt. Pt has upcoming appt on 09/25/24 for AWV and was also seen in March 2025. Pt is running out of meds by Monday and would like to have this resolved as soon as possible. Please call pt to follow up on completion, thank you

## 2024-08-12 ENCOUNTER — Other Ambulatory Visit (HOSPITAL_COMMUNITY): Payer: Self-pay

## 2024-08-14 ENCOUNTER — Telehealth: Payer: Self-pay | Admitting: Internal Medicine

## 2024-08-14 ENCOUNTER — Other Ambulatory Visit (HOSPITAL_COMMUNITY): Payer: Self-pay

## 2024-08-14 MED ORDER — FAMOTIDINE 20 MG PO TABS
20.0000 mg | ORAL_TABLET | Freq: Every day | ORAL | 0 refills | Status: DC
  Filled 2024-08-14: qty 30, 30d supply, fill #0

## 2024-08-14 MED ORDER — PANTOPRAZOLE SODIUM 40 MG PO TBEC
40.0000 mg | DELAYED_RELEASE_TABLET | Freq: Every day | ORAL | 0 refills | Status: DC
  Filled 2024-08-14: qty 30, 30d supply, fill #0

## 2024-08-14 MED ORDER — OLMESARTAN MEDOXOMIL 40 MG PO TABS
40.0000 mg | ORAL_TABLET | Freq: Every day | ORAL | 0 refills | Status: DC
  Filled 2024-08-14: qty 30, 30d supply, fill #0

## 2024-08-14 MED ORDER — EMPAGLIFLOZIN 10 MG PO TABS
10.0000 mg | ORAL_TABLET | Freq: Every day | ORAL | 0 refills | Status: DC
  Filled 2024-08-14: qty 30, 30d supply, fill #0

## 2024-08-14 MED ORDER — LAMOTRIGINE 25 MG PO TABS
25.0000 mg | ORAL_TABLET | Freq: Two times a day (BID) | ORAL | 0 refills | Status: DC
  Filled 2024-08-14: qty 60, 30d supply, fill #0

## 2024-08-14 NOTE — Telephone Encounter (Unsigned)
 Copied from CRM [id number]. Topic: General - Other >> Aug 14, 2024 11:24 AM Gennette ORN wrote: Reason for CRM: Patient called in to speak to [employer]. I tried to call patient back got his voicemail.

## 2024-08-14 NOTE — Telephone Encounter (Signed)
 Medication has been refilled.

## 2024-08-14 NOTE — Telephone Encounter (Signed)
 Patient has been made aware that his medication has been refilled .

## 2024-08-22 ENCOUNTER — Encounter: Payer: Self-pay | Admitting: Internal Medicine

## 2024-08-22 ENCOUNTER — Ambulatory Visit (INDEPENDENT_AMBULATORY_CARE_PROVIDER_SITE_OTHER): Admitting: Internal Medicine

## 2024-08-22 ENCOUNTER — Ambulatory Visit (INDEPENDENT_AMBULATORY_CARE_PROVIDER_SITE_OTHER)

## 2024-08-22 ENCOUNTER — Other Ambulatory Visit (HOSPITAL_COMMUNITY): Payer: Self-pay

## 2024-08-22 ENCOUNTER — Ambulatory Visit: Payer: Self-pay | Admitting: Internal Medicine

## 2024-08-22 VITALS — BP 134/76 | HR 121 | Temp 98.5°F | Ht 64.0 in | Wt 167.2 lb

## 2024-08-22 DIAGNOSIS — R059 Cough, unspecified: Secondary | ICD-10-CM | POA: Diagnosis not present

## 2024-08-22 DIAGNOSIS — R0789 Other chest pain: Secondary | ICD-10-CM | POA: Diagnosis not present

## 2024-08-22 DIAGNOSIS — K449 Diaphragmatic hernia without obstruction or gangrene: Secondary | ICD-10-CM | POA: Diagnosis not present

## 2024-08-22 DIAGNOSIS — R000 Tachycardia, unspecified: Secondary | ICD-10-CM

## 2024-08-22 DIAGNOSIS — N1831 Chronic kidney disease, stage 3a: Secondary | ICD-10-CM | POA: Diagnosis not present

## 2024-08-22 DIAGNOSIS — U071 COVID-19: Secondary | ICD-10-CM

## 2024-08-22 DIAGNOSIS — E1122 Type 2 diabetes mellitus with diabetic chronic kidney disease: Secondary | ICD-10-CM

## 2024-08-22 DIAGNOSIS — E118 Type 2 diabetes mellitus with unspecified complications: Secondary | ICD-10-CM

## 2024-08-22 DIAGNOSIS — K7581 Nonalcoholic steatohepatitis (NASH): Secondary | ICD-10-CM | POA: Diagnosis not present

## 2024-08-22 DIAGNOSIS — R051 Acute cough: Secondary | ICD-10-CM

## 2024-08-22 DIAGNOSIS — R0602 Shortness of breath: Secondary | ICD-10-CM | POA: Diagnosis not present

## 2024-08-22 DIAGNOSIS — I10 Essential (primary) hypertension: Secondary | ICD-10-CM | POA: Diagnosis not present

## 2024-08-22 DIAGNOSIS — R509 Fever, unspecified: Secondary | ICD-10-CM

## 2024-08-22 DIAGNOSIS — E538 Deficiency of other specified B group vitamins: Secondary | ICD-10-CM | POA: Diagnosis not present

## 2024-08-22 LAB — CBC WITH DIFFERENTIAL/PLATELET
Basophils Absolute: 0 K/uL (ref 0.0–0.1)
Basophils Relative: 0.7 % (ref 0.0–3.0)
Eosinophils Absolute: 0 K/uL (ref 0.0–0.7)
Eosinophils Relative: 0.5 % (ref 0.0–5.0)
HCT: 45.7 % (ref 39.0–52.0)
Hemoglobin: 15.4 g/dL (ref 13.0–17.0)
Lymphocytes Relative: 23.6 % (ref 12.0–46.0)
Lymphs Abs: 1.7 K/uL (ref 0.7–4.0)
MCHC: 33.7 g/dL (ref 30.0–36.0)
MCV: 86.1 fl (ref 78.0–100.0)
Monocytes Absolute: 1.4 K/uL — ABNORMAL HIGH (ref 0.1–1.0)
Monocytes Relative: 18.8 % — ABNORMAL HIGH (ref 3.0–12.0)
Neutro Abs: 4.1 K/uL (ref 1.4–7.7)
Neutrophils Relative %: 56.4 % (ref 43.0–77.0)
Platelets: 201 K/uL (ref 150.0–400.0)
RBC: 5.31 Mil/uL (ref 4.22–5.81)
RDW: 14.8 % (ref 11.5–15.5)
WBC: 7.2 K/uL (ref 4.0–10.5)

## 2024-08-22 LAB — HEPATIC FUNCTION PANEL
ALT: 31 U/L (ref 0–53)
AST: 30 U/L (ref 0–37)
Albumin: 4.2 g/dL (ref 3.5–5.2)
Alkaline Phosphatase: 51 U/L (ref 39–117)
Bilirubin, Direct: 0.1 mg/dL (ref 0.0–0.3)
Total Bilirubin: 0.3 mg/dL (ref 0.2–1.2)
Total Protein: 7.4 g/dL (ref 6.0–8.3)

## 2024-08-22 LAB — POCT INFLUENZA A/B
Influenza A, POC: NEGATIVE
Influenza B, POC: NEGATIVE

## 2024-08-22 LAB — TROPONIN I (HIGH SENSITIVITY): High Sens Troponin I: 7 ng/L (ref 2–17)

## 2024-08-22 LAB — POC COVID19 BINAXNOW: SARS Coronavirus 2 Ag: POSITIVE — AB

## 2024-08-22 LAB — HEMOGLOBIN A1C: Hgb A1c MFr Bld: 6.5 % (ref 4.6–6.5)

## 2024-08-22 MED ORDER — MOLNUPIRAVIR EUA 200MG CAPSULE
4.0000 | ORAL_CAPSULE | Freq: Two times a day (BID) | ORAL | 0 refills | Status: AC
  Filled 2024-08-22: qty 40, 5d supply, fill #0

## 2024-08-22 MED ORDER — PROMETHAZINE-DM 6.25-15 MG/5ML PO SYRP
5.0000 mL | ORAL_SOLUTION | Freq: Four times a day (QID) | ORAL | 0 refills | Status: DC | PRN
  Filled 2024-08-22: qty 118, 6d supply, fill #0

## 2024-08-22 NOTE — Patient Instructions (Signed)
 COVID-19: What to Know COVID-19 is an infection caused by a virus called SARS-CoV-2. This type of virus is called a coronavirus. People with COVID-19 may: Have few to no symptoms. Have mild to moderate symptoms that affect their lungs and breathing. Get very sick. What are the causes?  COVID-19 is caused by a virus. This virus may be in the air as droplets or on surfaces. It can spread from an infected person when they cough, sneeze, speak, sing, or breathe. You may become infected if: You breathe in the infected droplets in the air. You touch an object that has the virus on it. What increases the risk? You are at risk of getting COVID-19 if you have been around someone with the infection. You may be more likely to get very sick if: You are 73 years old or older. You have certain medical conditions, such as: Heart disease. Diabetes. Long-term respiratory disease. Cancer. Pregnancy. You are immunocompromised. This means your body can't fight infections easily. You have a disability that makes it hard for you to move around, you have trouble moving, or you can't move at all. What are the signs or symptoms? People may have different symptoms from COVID-19. The symptoms can also be mild to very bad. They often show up in 5-6 days after being infected. But, they can take up to 14 days to appear. Common symptoms are: Cough. Feeling tired. New loss of taste or smell. Fever. Less common symptoms are: Sore throat. Headache. Body or muscle aches. Diarrhea. A skin rash or fingers or toes that are a different color than usual. Red or irritated eyes. Sometimes, COVID-19 does not cause symptoms. How is this diagnosed? COVID-19 can be diagnosed with tests done in the lab or at home. Fluid from your nose, mouth, or lungs will be used to check for the virus. How is this treated? Treatment for COVID-19 depends on how sick you are. Mild symptoms can be treated at home with rest, fluids, and  over-the-counter medicines. very bad symptoms may be treated in a hospital intensive care unit (ICU). If you have symptoms and are at risk of getting very sick, you may be given a medicine that fights viruses. This medicine is called an antiviral. How is this prevented? To protect yourself from COVID-19: Know your risk factors. Get vaccinated. If your body can't fight infections easily, talk to your provider about treatment to help prevent COVID-19. Stay at least about 3 feet (1 meter) away from other people. Wear mask that fits well when: You can't stay at a distance from people. You're in a place with not a lot of air flow. Try to be in open spaces with good air flow when you are in public. Wash your hands often or use an alcohol-based hand sanitizer. Cover your nose and mouth when you cough or sneeze. If you think you have COVID-19 or have been around someone who has it, stay home and away from other people as told by your provider or health officials. Where to find more information To learn more: Go to [employer] Click Health Topics. Type COVID-19 in the search box. Go to [employer].br Click Health Topics. Then click All Topics. Type COVID-19 in the search box. Get help right away if: You have trouble breathing or get short of breath. You have pain or pressure in your chest. You're feeling confused. These symptoms may be an emergency. Get help right away. Call 911. Do not wait to see if the symptoms will go away.  Do not drive yourself to the hospital. This information is not intended to replace advice given to you by your health care provider. Make sure you discuss any questions you have with your health care provider. Document Revised: 08/26/2023 Document Reviewed: 08/18/2023 Elsevier Patient Education  2025 [employer].

## 2024-08-22 NOTE — Progress Notes (Signed)
 Subjective:  Patient ID: Robert Mccall, male    DOB: 1951/10/03  Age: 73 y.o. MRN: [id number]  CC: Fever, elevated heart rate (120-129), and Chest Pain (Pressure )   HPI Robert Mccall presents for f/up -   Discussed the use of AI scribe software for clinical note transcription with the patient, who gave verbal consent to proceed.  History of Present Illness  Discussed the use of AI scribe software for clinical note transcription with the patient, who gave verbal consent to proceed.  History of Present Illness- He complains of a 2 day history for cough, fever, chills, sore throat, chest pressure, and SOB.  Outpatient Medications Prior to Visit  Medication Sig Dispense Refill   alfuzosin  (UROXATRAL ) 10 MG 24 hr tablet Take 1 tablet (10 mg total) by mouth daily with breakfast. 90 tablet 1   aspirin  EC 81 MG tablet Take 1 tablet (81 mg total) by mouth daily. Swallow whole. 90 tablet 1   Cholecalciferol (VITAMIN D ) 50 MCG (2000 UT) CAPS Take 1 capsule (2,000 Units total) by mouth daily. 30 capsule 11   clopidogrel  (PLAVIX ) 75 MG tablet Take 1 tablet (75 mg total) by mouth daily. 90 tablet 2   Docusate Sodium 100 MG capsule Take 100 mg by mouth 2 (two) times daily.     DULoxetine  (CYMBALTA ) 30 MG capsule Take 1 capsule (30 mg total) by mouth daily. 90 capsule 0   empagliflozin  (JARDIANCE ) 10 MG TABS tablet Take 1 tablet (10 mg total) by mouth daily before breakfast. Please schedule an appt with PCP for further refills 30 tablet 0   eszopiclone  (LUNESTA ) 2 MG TABS tablet Take 1 tablet (2 mg total) by mouth at bedtime as needed for sleep. Take immediately before bedtime 90 tablet 0   famotidine  (PEPCID ) 20 MG tablet Take 1 tablet (20 mg total) by mouth daily after supper 30 tablet 0   finasteride  (PROSCAR ) 5 MG tablet Take 1 tablet (5 mg total) by mouth daily. 90 tablet 1   ketoconazole  (NIZORAL ) 2 % shampoo Apply 1 Application topically 2 (two) times a week. Allow to sit on Scalp  for about 30 seconds to 1 minute 120 mL 6   lamoTRIgine  (LAMICTAL ) 25 MG tablet Take 1 tablet (25 mg total) by mouth 2 (two) times daily. 60 tablet 0   metoprolol  tartrate (LOPRESSOR ) 25 MG tablet Take 1.5 tablets (37.5 mg total) by mouth 2 (two) times daily. 270 tablet 0   Multiple Vitamins-Minerals (ICAPS AREDS 2 PO) Take by mouth.     nitroGLYCERIN  (NITROSTAT ) 0.4 MG SL tablet Place 1 tablet (0.4 mg total) under the tongue every 5 (five) minutes as needed for chest pain (Call 911 if 3 doses does not relieve pain.). 25 tablet 3   olmesartan  (BENICAR ) 40 MG tablet Take 1 tablet (40 mg total) by mouth daily. 30 tablet 0   pantoprazole  (PROTONIX ) 40 MG tablet Take 1 tablet (40 mg total) by mouth daily. Take 30-60 min before first meal of the day 30 tablet 0   Probiotic Product (PROBIOTIC-10 PO) Take 1 capsule by mouth daily.     rosuvastatin  (CRESTOR ) 20 MG tablet Take 1 tablet (20 mg total) by mouth daily. 90 tablet 0   vitamin B-12 (CYANOCOBALAMIN ) 1000 MCG tablet Take 1,000 mcg by mouth daily.     methylphenidate  (METADATE  ER) 20 MG ER tablet Take 1 tablet (20 mg total) by mouth daily. 90 tablet 0   No facility-administered medications prior to visit.  ROS Review of Systems  Constitutional:  Positive for chills, fatigue and fever. Negative for appetite change, diaphoresis and unexpected weight change.  HENT:  Positive for hearing loss and sore throat. Negative for facial swelling, sinus pressure and trouble swallowing.   Eyes: Negative.   Respiratory:  Positive for cough and shortness of breath. Negative for chest tightness, wheezing and stridor.   Cardiovascular:  Positive for chest pain. Negative for palpitations and leg swelling.  Gastrointestinal:  Negative for abdominal pain, constipation, diarrhea, nausea and vomiting.  Endocrine: Negative.   Genitourinary: Negative.   Musculoskeletal: Negative.  Negative for arthralgias.  Skin: Negative.  Negative for color change and rash.   Neurological: Negative.  Negative for dizziness, weakness, light-headedness and headaches.  Hematological:  Negative for adenopathy. Does not bruise/bleed easily.  Psychiatric/Behavioral: Negative.      Objective:  BP 134/76 (BP Location: Left Arm, Patient Position: Sitting, Cuff Size: Normal)   Pulse (!) 121   Temp 98.5 F (36.9 C) (Oral)   Ht 5' 4 (1.626 m)   Wt 167 lb 3.2 oz (75.8 kg)   SpO2 97%   BMI 28.70 kg/m   BP Readings from Last 3 Encounters:  08/22/24 134/76  02/10/24 124/72  01/19/24 119/79    Wt Readings from Last 3 Encounters:  08/22/24 167 lb 3.2 oz (75.8 kg)  02/10/24 172 lb (78 kg)  09/22/23 168 lb (76.2 kg)    Physical Exam Vitals reviewed.  Constitutional:      General: He is not in acute distress.    Appearance: He is not ill-appearing, toxic-appearing or diaphoretic.  HENT:     Nose: Nose normal.     Mouth/Throat:     Mouth: Mucous membranes are moist.  Eyes:     General: No scleral icterus.    Conjunctiva/sclera: Conjunctivae normal.  Cardiovascular:     Rate and Rhythm: Regular rhythm. Tachycardia present.     Heart sounds: No murmur heard.    No friction rub. No gallop.     Comments: EKG--- ST (new), 120 bpm LAD Inferior infarct pattern is not new No acute ST/T wave changes   Pulmonary:     Effort: Pulmonary effort is normal.     Breath sounds: No stridor. No wheezing, rhonchi or rales.  Abdominal:     General: Abdomen is flat.     Palpations: There is no mass.     Tenderness: There is no abdominal tenderness. There is no guarding.     Hernia: No hernia is present.  Musculoskeletal:        General: Normal range of motion.     Cervical back: Neck supple.     Right lower leg: No edema.     Left lower leg: No edema.  Lymphadenopathy:     Cervical: No cervical adenopathy.  Skin:    General: Skin is warm and dry.     Findings: No rash.  Neurological:     General: No focal deficit present.     Mental Status: He is alert.   Psychiatric:        Mood and Affect: Mood normal.        Behavior: Behavior normal.     Lab Results  Component Value Date   WBC 7.2 08/22/2024   HGB 15.4 08/22/2024   HCT 45.7 08/22/2024   PLT 201.0 08/22/2024   GLUCOSE 124 (H) 08/22/2024   CHOL 112 02/10/2024   TRIG 164.0 (H) 02/10/2024   HDL 42.00 02/10/2024   LDLDIRECT  76.0 11/29/2019   LDLCALC 37 02/10/2024   ALT 31 08/22/2024   AST 30 08/22/2024   NA 132 (L) 08/22/2024   K 3.7 08/22/2024   CL 106 08/22/2024   CREATININE 1.30 08/22/2024   BUN 17 08/22/2024   CO2 25 08/22/2024   TSH 1.86 02/10/2024   PSA 0.24 02/10/2024   INR 1.04 01/23/2017   HGBA1C 6.5 08/22/2024   MICROALBUR 2.5 (H) 02/10/2024    DG Chest 2 View Result Date: 08/22/2024 CLINICAL DATA:  Cough and fever.  Shortness of breath. EXAM: CHEST - 2 VIEW COMPARISON:  12/17/2022 FINDINGS: The heart is normal in size. Small hiatal hernia. Otherwise normal mediastinal contours. No focal airspace disease. No pleural effusion or pneumothorax. Normal pulmonary vasculature. Thoracic spondylosis. Benign calcified granuloma suspected in the left upper lung zone, unchanged from prior. IMPRESSION: 1. No acute chest findings. 2. Small hiatal hernia. Electronically Signed   By: Andrea Gasman M.D.   On: 08/22/2024 16:34    Fibrosis 4 Score = 1.96  Fib-4 interpretation is not validated for people under 35 or over 29 years of age. However, scores under 2.0 are generally considered low risk.   Assessment & Plan:  B12 deficiency -     CBC with Differential/Platelet; Future  Type II diabetes mellitus with manifestations (HCC)- Blood sugar is well controlled. -     Basic metabolic panel with GFR; Future -     Hemoglobin A1c; Future  Essential hypertension -     Basic metabolic panel with GFR; Future  Stage 3a chronic kidney disease (HCC)- Will avoid nephrotoxic agents   NASH (nonalcoholic steatohepatitis) -     Hepatic function panel; Future  COVID -      molnupiravir  EUA; Take 4 capsules (800 mg total) by mouth 2 (two) times daily for 5 days.  Dispense: 40 capsule; Refill: 0 -     Promethazine -DM; Take 5 mLs by mouth 4 (four) times daily as needed for cough.  Dispense: 118 mL; Refill: 0  Acute cough -     DG Chest 2 View; Future -     POC COVID-19 BinaxNow -     POCT Influenza A/B -     Promethazine -DM; Take 5 mLs by mouth 4 (four) times daily as needed for cough.  Dispense: 118 mL; Refill: 0  Fever, unspecified fever cause -     POC COVID-19 BinaxNow -     POCT Influenza A/B  Tachycardia- Will hold the amphetamine. -     EKG 12-Lead  Sensation of chest pressure -     Troponin I (High Sensitivity); Future     Follow-up: Return in about 3 weeks (around 09/12/2024).  Debby Molt, MD

## 2024-08-26 LAB — BASIC METABOLIC PANEL WITH GFR
BUN: 17 mg/dL (ref 6–23)
CO2: 25 meq/L (ref 19–32)
Calcium: 9.1 mg/dL (ref 8.4–10.5)
Chloride: 106 meq/L (ref 96–112)
Creatinine, Ser: 1.3 mg/dL (ref 0.40–1.50)
GFR: 54.53 mL/min — ABNORMAL LOW (ref >60.00)
Glucose, Bld: 124 mg/dL — ABNORMAL HIGH (ref 70–99)
Potassium: 3.7 meq/L (ref 3.5–5.1)
Sodium: 137 meq/L (ref 135–145)

## 2024-08-29 ENCOUNTER — Other Ambulatory Visit: Payer: Self-pay

## 2024-08-29 ENCOUNTER — Other Ambulatory Visit: Payer: Self-pay | Admitting: Internal Medicine

## 2024-08-29 ENCOUNTER — Encounter: Payer: Self-pay | Admitting: Internal Medicine

## 2024-08-29 ENCOUNTER — Other Ambulatory Visit (HOSPITAL_COMMUNITY): Payer: Self-pay

## 2024-08-29 DIAGNOSIS — I2510 Atherosclerotic heart disease of native coronary artery without angina pectoris: Secondary | ICD-10-CM

## 2024-08-29 MED ORDER — METOPROLOL TARTRATE 25 MG PO TABS
37.5000 mg | ORAL_TABLET | Freq: Two times a day (BID) | ORAL | 0 refills | Status: DC
  Filled 2024-08-29: qty 270, 90d supply, fill #0

## 2024-08-30 ENCOUNTER — Other Ambulatory Visit: Payer: Self-pay | Admitting: Internal Medicine

## 2024-08-30 ENCOUNTER — Other Ambulatory Visit (HOSPITAL_COMMUNITY): Payer: Self-pay

## 2024-08-30 DIAGNOSIS — N401 Enlarged prostate with lower urinary tract symptoms: Secondary | ICD-10-CM

## 2024-08-31 ENCOUNTER — Other Ambulatory Visit (HOSPITAL_COMMUNITY): Payer: Self-pay

## 2024-08-31 MED ORDER — FINASTERIDE 5 MG PO TABS
5.0000 mg | ORAL_TABLET | Freq: Every day | ORAL | 1 refills | Status: AC
  Filled 2024-08-31: qty 90, 90d supply, fill #0
  Filled 2024-11-28: qty 90, 90d supply, fill #1

## 2024-08-31 MED ORDER — ALFUZOSIN HCL ER 10 MG PO TB24
10.0000 mg | ORAL_TABLET | Freq: Every day | ORAL | 1 refills | Status: AC
  Filled 2024-08-31: qty 90, 90d supply, fill #0
  Filled 2024-11-28: qty 90, 90d supply, fill #1

## 2024-09-02 ENCOUNTER — Encounter: Payer: Self-pay | Admitting: Internal Medicine

## 2024-09-02 ENCOUNTER — Other Ambulatory Visit: Payer: Self-pay | Admitting: Internal Medicine

## 2024-09-02 DIAGNOSIS — I2510 Atherosclerotic heart disease of native coronary artery without angina pectoris: Secondary | ICD-10-CM

## 2024-09-02 DIAGNOSIS — E118 Type 2 diabetes mellitus with unspecified complications: Secondary | ICD-10-CM

## 2024-09-02 DIAGNOSIS — N1831 Chronic kidney disease, stage 3a: Secondary | ICD-10-CM

## 2024-09-02 DIAGNOSIS — K219 Gastro-esophageal reflux disease without esophagitis: Secondary | ICD-10-CM

## 2024-09-02 DIAGNOSIS — I10 Essential (primary) hypertension: Secondary | ICD-10-CM

## 2024-09-02 DIAGNOSIS — F331 Major depressive disorder, recurrent, moderate: Secondary | ICD-10-CM

## 2024-09-07 ENCOUNTER — Other Ambulatory Visit (HOSPITAL_COMMUNITY): Payer: Self-pay

## 2024-09-08 ENCOUNTER — Other Ambulatory Visit (HOSPITAL_COMMUNITY): Payer: Self-pay

## 2024-09-08 ENCOUNTER — Other Ambulatory Visit: Payer: Self-pay

## 2024-09-08 DIAGNOSIS — F331 Major depressive disorder, recurrent, moderate: Secondary | ICD-10-CM

## 2024-09-08 DIAGNOSIS — K219 Gastro-esophageal reflux disease without esophagitis: Secondary | ICD-10-CM

## 2024-09-08 DIAGNOSIS — E118 Type 2 diabetes mellitus with unspecified complications: Secondary | ICD-10-CM

## 2024-09-08 DIAGNOSIS — N1831 Chronic kidney disease, stage 3a: Secondary | ICD-10-CM

## 2024-09-08 MED ORDER — OLMESARTAN MEDOXOMIL 40 MG PO TABS
40.0000 mg | ORAL_TABLET | Freq: Every day | ORAL | 0 refills | Status: DC
  Filled 2024-09-08: qty 90, 90d supply, fill #0

## 2024-09-08 MED ORDER — FAMOTIDINE 20 MG PO TABS
20.0000 mg | ORAL_TABLET | Freq: Every day | ORAL | 0 refills | Status: DC
  Filled 2024-09-08: qty 90, 90d supply, fill #0

## 2024-09-08 MED ORDER — EMPAGLIFLOZIN 10 MG PO TABS
10.0000 mg | ORAL_TABLET | Freq: Every day | ORAL | 0 refills | Status: DC
  Filled 2024-09-08: qty 90, 90d supply, fill #0

## 2024-09-08 MED ORDER — LAMOTRIGINE 25 MG PO TABS
25.0000 mg | ORAL_TABLET | Freq: Two times a day (BID) | ORAL | 0 refills | Status: DC
  Filled 2024-09-08: qty 180, 90d supply, fill #0

## 2024-09-08 MED ORDER — PANTOPRAZOLE SODIUM 40 MG PO TBEC
40.0000 mg | DELAYED_RELEASE_TABLET | Freq: Every day | ORAL | 0 refills | Status: DC
  Filled 2024-09-08: qty 90, 90d supply, fill #0

## 2024-09-11 ENCOUNTER — Inpatient Hospital Stay: Payer: PPO | Attending: Hematology and Oncology

## 2024-09-11 DIAGNOSIS — G8929 Other chronic pain: Secondary | ICD-10-CM | POA: Diagnosis not present

## 2024-09-11 DIAGNOSIS — D472 Monoclonal gammopathy: Secondary | ICD-10-CM | POA: Insufficient documentation

## 2024-09-11 DIAGNOSIS — M549 Dorsalgia, unspecified: Secondary | ICD-10-CM | POA: Insufficient documentation

## 2024-09-11 LAB — CBC WITH DIFFERENTIAL/PLATELET
Abs Immature Granulocytes: 0.02 K/uL (ref 0.00–0.07)
Basophils Absolute: 0 K/uL (ref 0.0–0.1)
Basophils Relative: 0 %
Eosinophils Absolute: 0.2 K/uL (ref 0.0–0.5)
Eosinophils Relative: 3 %
HCT: 45.8 % (ref 39.0–52.0)
Hemoglobin: 15.9 g/dL (ref 13.0–17.0)
Immature Granulocytes: 0 %
Lymphocytes Relative: 33 %
Lymphs Abs: 2.5 K/uL (ref 0.7–4.0)
MCH: 29.3 pg (ref 26.0–34.0)
MCHC: 34.7 g/dL (ref 30.0–36.0)
MCV: 84.5 fL (ref 80.0–100.0)
Monocytes Absolute: 0.8 K/uL (ref 0.1–1.0)
Monocytes Relative: 11 %
Neutro Abs: 3.9 K/uL (ref 1.7–7.7)
Neutrophils Relative %: 53 %
Platelets: 268 K/uL (ref 150–400)
RBC: 5.42 MIL/uL (ref 4.22–5.81)
RDW: 14.5 % (ref 11.5–15.5)
WBC: 7.4 K/uL (ref 4.0–10.5)
nRBC: 0 % (ref 0.0–0.2)

## 2024-09-11 LAB — COMPREHENSIVE METABOLIC PANEL WITH GFR
ALT: 25 U/L (ref 0–44)
AST: 24 U/L (ref 15–41)
Albumin: 4.3 g/dL (ref 3.5–5.0)
Alkaline Phosphatase: 64 U/L (ref 38–126)
Anion gap: 6 (ref 5–15)
BUN: 16 mg/dL (ref 8–23)
CO2: 28 mmol/L (ref 22–32)
Calcium: 9.8 mg/dL (ref 8.9–10.3)
Chloride: 104 mmol/L (ref 98–111)
Creatinine, Ser: 1.22 mg/dL (ref 0.61–1.24)
GFR, Estimated: >60 mL/min (ref >60)
Glucose, Bld: 107 mg/dL — ABNORMAL HIGH (ref 70–99)
Potassium: 4 mmol/L (ref 3.5–5.1)
Sodium: 138 mmol/L (ref 135–145)
Total Bilirubin: 0.6 mg/dL (ref 0.0–1.2)
Total Protein: 7.4 g/dL (ref 6.5–8.1)

## 2024-09-12 ENCOUNTER — Encounter: Payer: Self-pay | Admitting: Internal Medicine

## 2024-09-12 LAB — KAPPA/LAMBDA LIGHT CHAINS
Kappa free light chain: 19 mg/L (ref 3.3–19.4)
Kappa, lambda light chain ratio: 0.95 (ref 0.26–1.65)
Lambda free light chains: 20 mg/L (ref 5.7–26.3)

## 2024-09-14 LAB — MULTIPLE MYELOMA PANEL, SERUM
Albumin SerPl Elph-Mcnc: 3.7 g/dL (ref 2.9–4.4)
Albumin/Glob SerPl: 1.2 (ref 0.7–1.7)
Alpha 1: 0.2 g/dL (ref 0.0–0.4)
Alpha2 Glob SerPl Elph-Mcnc: 0.9 g/dL (ref 0.4–1.0)
B-Globulin SerPl Elph-Mcnc: 0.9 g/dL (ref 0.7–1.3)
Gamma Glob SerPl Elph-Mcnc: 1.3 g/dL (ref 0.4–1.8)
Globulin, Total: 3.2 g/dL (ref 2.2–3.9)
IgA: 144 mg/dL (ref 61–437)
IgG (Immunoglobin G), Serum: 1220 mg/dL (ref 603–1613)
IgM (Immunoglobulin M), Srm: 108 mg/dL (ref 15–143)
M Protein SerPl Elph-Mcnc: 0.7 g/dL — ABNORMAL HIGH
Total Protein ELP: 6.9 g/dL (ref 6.0–8.5)

## 2024-09-15 ENCOUNTER — Telehealth: Payer: Self-pay

## 2024-09-15 NOTE — Telephone Encounter (Signed)
 He called back. Appt changed to in person at the same time 10/16. He is aware of appt.

## 2024-09-15 NOTE — Telephone Encounter (Signed)
 Called and left a message asking him to call the office back regarding 10/16 appt. Asking if her would change appt to in person appt due to recent changes with medicare.

## 2024-09-18 ENCOUNTER — Encounter: Payer: Self-pay | Admitting: Internal Medicine

## 2024-09-21 ENCOUNTER — Inpatient Hospital Stay: Payer: PPO | Admitting: Hematology and Oncology

## 2024-09-21 ENCOUNTER — Encounter: Payer: Self-pay | Admitting: Hematology and Oncology

## 2024-09-21 ENCOUNTER — Inpatient Hospital Stay: Admitting: Hematology and Oncology

## 2024-09-21 VITALS — BP 126/86 | HR 76 | Temp 98.9°F | Resp 18 | Ht 64.0 in | Wt 173.8 lb

## 2024-09-21 DIAGNOSIS — D472 Monoclonal gammopathy: Secondary | ICD-10-CM | POA: Diagnosis not present

## 2024-09-21 NOTE — Progress Notes (Signed)
 [facility] OFFICE PROGRESS NOTE  Patient Care Team: Joshua Debby CROME, MD as PCP - General (Internal Medicine) Nahser, Aleene PARAS, MD (Inactive) as PCP - Cardiology (Cardiology) Lita Lye, OD (Optometry) Nahser, Aleene PARAS, MD (Inactive) as Consulting Physician (Cardiology) Ablott, Camellia, OD (Optometry)  ASSESSMENT & PLAN:  Assessment & Plan MGUS (monoclonal gammopathy of unknown significance) The patient is noted to have IgG lambda MGUS as part of his workup for neuropathy in 2017 According to his recent blood work, and there were nothing to suggest end organ damage. So far, he has no signs of disease progression We will continue close observation with annual blood work We discussed importance of annual influenza vaccination  No orders of the defined types were placed in this encounter.    INTERVAL HISTORY: he returns for surveillance follow-up for diagnosis of MGUS Patient denies recurrent infection or bone pain He has chronic back pain, stable We reviewed recent CBC, CMP and myeloma panel results  PHYSICAL EXAMINATION: ECOG PERFORMANCE STATUS: 0 - Asymptomatic  Vitals:   09/21/24 1247  BP: 126/86  Pulse: 76  Resp: 18  Temp: 98.9 F (37.2 C)  SpO2: 98%

## 2024-09-21 NOTE — Assessment & Plan Note (Addendum)
 The patient is noted to have IgG lambda MGUS as part of his workup for neuropathy in 2017 According to his recent blood work, and there were nothing to suggest end organ damage. So far, he has no signs of disease progression We will continue close observation with annual blood work We discussed importance of annual influenza vaccination

## 2024-09-25 ENCOUNTER — Ambulatory Visit: Payer: PPO

## 2024-09-27 ENCOUNTER — Ambulatory Visit (INDEPENDENT_AMBULATORY_CARE_PROVIDER_SITE_OTHER)

## 2024-09-27 VITALS — BP 130/72 | HR 69 | Ht 64.0 in | Wt 173.2 lb

## 2024-09-27 DIAGNOSIS — Z0000 Encounter for general adult medical examination without abnormal findings: Secondary | ICD-10-CM

## 2024-09-27 DIAGNOSIS — Z23 Encounter for immunization: Secondary | ICD-10-CM

## 2024-09-27 NOTE — Patient Instructions (Addendum)
 [name],  Thank you for taking the time for your Medicare Wellness Visit. I appreciate your continued commitment to your health goals. Please review the care plan we discussed, and feel free to reach out if I can assist you further.  Medicare recommends these wellness visits once per year to help you and your care team stay ahead of potential health issues. These visits are designed to focus on prevention, allowing your provider to concentrate on managing your acute and chronic conditions during your regular appointments.  Please note that Annual Wellness Visits do not include a physical exam. Some assessments may be limited, especially if the visit was conducted virtually. If needed, we may recommend a separate in-person follow-up with your provider.  Ongoing Care Seeing your primary care provider every 3 to 6 months helps us  monitor your health and provide consistent, personalized care.   Referrals If a referral was made during today's visit and you haven't received any updates within two weeks, please contact the referred provider directly to check on the status.  Recommended Screenings:  Health Maintenance  Topic Date Due   COVID-19 Vaccine (6 - 2025-26 season) 08/07/2024   Eye exam for diabetics  12/13/2024   Yearly kidney health urinalysis for diabetes  02/09/2025   Complete foot exam   02/09/2025   Hemoglobin A1C  02/19/2025   Yearly kidney function blood test for diabetes  09/11/2025   Medicare Annual Wellness Visit  09/27/2025   Colon Cancer Screening  12/15/2026   DTaP/Tdap/Td vaccine (4 - Td or Tdap) 03/17/2032   Pneumococcal Vaccine for age over 74  Completed   Hepatitis C Screening  Completed   Zoster (Shingles) Vaccine  Completed   Meningitis B Vaccine  Aged Out       09/27/2024    8:06 AM  Advanced Directives  Does Patient Have a Medical Advance Directive? Yes  Type of Estate agent of [place];Living will  Copy of Healthcare Power of  Attorney in Chart? No - copy requested   Advance Care Planning is important because it: Ensures you receive medical care that aligns with your values, goals, and preferences. Provides guidance to your family and loved ones, reducing the emotional burden of decision-making during critical moments.  Vision: Annual vision screenings are recommended for early detection of glaucoma, cataracts, and diabetic retinopathy. These exams can also reveal signs of chronic conditions such as diabetes and high blood pressure.  Dental: Annual dental screenings help detect early signs of oral cancer, gum disease, and other conditions linked to overall health, including heart disease and diabetes.

## 2024-09-27 NOTE — Progress Notes (Signed)
 Subjective:   Robert Mccall is a 73 y.o. who presents for a Medicare Wellness preventive visit.  As a reminder, Annual Wellness Visits don't include a physical exam, and some assessments may be limited, especially if this visit is performed virtually. We may recommend an in-person follow-up visit with your provider if needed.  Visit Complete: In person  Persons Participating in Visit: Patient.  AWV Questionnaire: Yes: Patient Medicare AWV questionnaire was completed by the patient on 09/26/2024; I have confirmed that all information answered by patient is correct and no changes since this date.  Cardiac Risk Factors include: advanced age (>19men, >12 women);diabetes mellitus;dyslipidemia;hypertension;male gender;Other (see comment), Risk factor comments: CHF; CAD     Objective:    Today's Vitals   09/27/24 0806  BP: 130/72  Pulse: 69  SpO2: 96%  Weight: 173 lb 3.2 oz (78.6 kg)  Height: 5' 4 (1.626 m)   Body mass index is 29.73 kg/m.     09/27/2024    8:06 AM 09/22/2023    4:17 PM 01/07/2022    8:41 AM 08/26/2020    9:42 AM 03/28/2020    7:27 PM 09/30/2017    9:36 AM 01/23/2017   12:21 PM  Advanced Directives  Does Patient Have a Medical Advance Directive? Yes Yes Yes Yes Yes Yes  No   Type of Estate agent of [place];Living will Healthcare Power of [place];Living will Living will Healthcare Power of [place];Living will Living will    Does patient want to make changes to medical advance directive?   No - Patient declined   Yes (Inpatient - patient defers changing a medical advance directive at this time)    Copy of Healthcare Power of Attorney in Chart? No - copy requested No - copy requested  No - copy requested     Would patient like information on creating a medical advance directive?     No - Patient declined  No - Patient declined      Data saved with a previous flowsheet row definition    Current Medications (verified) Outpatient  Encounter Medications as of 09/27/2024  Medication Sig   alfuzosin  (UROXATRAL ) 10 MG 24 hr tablet Take 1 tablet (10 mg total) by mouth daily with breakfast.   aspirin  EC 81 MG tablet Take 1 tablet (81 mg total) by mouth daily. Swallow whole.   Cholecalciferol (VITAMIN D ) 50 MCG (2000 UT) CAPS Take 1 capsule (2,000 Units total) by mouth daily.   clopidogrel  (PLAVIX ) 75 MG tablet Take 1 tablet (75 mg total) by mouth daily.   Docusate Sodium 100 MG capsule Take 100 mg by mouth 2 (two) times daily.   DULoxetine  (CYMBALTA ) 30 MG capsule Take 1 capsule (30 mg total) by mouth daily.   empagliflozin  (JARDIANCE ) 10 MG TABS tablet Take 1 tablet (10 mg total) by mouth daily before breakfast.   eszopiclone  (LUNESTA ) 2 MG TABS tablet Take 1 tablet (2 mg total) by mouth at bedtime as needed for sleep. Take immediately before bedtime   famotidine  (PEPCID ) 20 MG tablet Take 1 tablet (20 mg total) by mouth daily after supper   finasteride  (PROSCAR ) 5 MG tablet Take 1 tablet (5 mg total) by mouth daily.   ketoconazole  (NIZORAL ) 2 % shampoo Apply 1 Application topically 2 (two) times a week. Allow to sit on [place]p for about 30 seconds to 1 minute   lamoTRIgine  (LAMICTAL ) 25 MG tablet Take 1 tablet (25 mg total) by mouth 2 (two) times daily.   metoprolol   tartrate (LOPRESSOR ) 25 MG tablet Take 1.5 tablets (37.5 mg total) by mouth 2 (two) times daily.   Multiple Vitamins-Minerals (ICAPS AREDS 2 PO) Take by mouth.   nitroGLYCERIN  (NITROSTAT ) 0.4 MG SL tablet Place 1 tablet (0.4 mg total) under the tongue every 5 (five) minutes as needed for chest pain (Call 911 if 3 doses does not relieve pain.).   olmesartan  (BENICAR ) 40 MG tablet Take 1 tablet (40 mg total) by mouth daily.   pantoprazole  (PROTONIX ) 40 MG tablet Take 1 tablet (40 mg total) by mouth daily. Take 30-60 min before first meal of the day   Probiotic Product (PROBIOTIC-10 PO) Take 1 capsule by mouth daily.   rosuvastatin  (CRESTOR ) 20 MG tablet Take 1 tablet  (20 mg total) by mouth daily.   vitamin B-12 (CYANOCOBALAMIN ) 1000 MCG tablet Take 1,000 mcg by mouth daily.   No facility-administered encounter medications on file as of 09/27/2024.    Allergies (verified) Patient has no known allergies.   History: Past Medical History:  Diagnosis Date   ADHD (attention deficit hyperactivity disorder)    Anxiety    Benign prostatic hypertrophy    Chronic lower back pain    surgery schedule for back 10/21/16   Cyst of right kidney    incidental [place] 1.3cm on R (MRI abd 09/2014), follows with uro   Depression    GERD (gastroesophageal reflux disease)    Glaucoma    Hard of hearing    History of migraine headaches    no meds   Hyperlipidemia    Hypertension    Menetrier's disease    Past Surgical History:  Procedure Laterality Date   COLONOSCOPY  09/2007   polyps/ [place]   HYDROCELE EXCISION Right 1990   LEFT HEART CATH AND CORONARY ANGIOGRAPHY N/A 01/07/2022   Procedure: LEFT HEART CATH AND CORONARY ANGIOGRAPHY;  Surgeon: Anner Alm ORN, MD;  Location: [facility] INVASIVE CV LAB;  Service: Cardiovascular;  Laterality: N/A;   LUMBAR DISC SURGERY  10/21/2016   ORCHIECTOMY Left 1970   WISDOM TOOTH EXTRACTION     Family History  Problem Relation Age of Onset   COPD Mother    Heart disease Mother    Breast cancer Mother 66   Osteoarthritis Mother        s/p B TKR   Depression Mother    Macular degeneration Father    Diabetes Father    Cancer Father        Colon Cancer   Breast cancer Maternal Grandmother    Colon cancer Neg Hx    Esophageal cancer Neg Hx    Rectal cancer Neg Hx    Stomach cancer Neg Hx    Social History   Socioeconomic History   Marital status: Single    Spouse name: Not on file   Number of children: Not on file   Years of education: Not on file   Highest education level: Master's degree (e.g., MA, MS, MEng, MEd, MSW, MBA)  Occupational History   Occupation: Production designer, theatre/television/film    Comment: community [employer]   Tobacco Use   Smoking status: Never    Passive exposure: Never   Smokeless tobacco: Never  Vaping Use   Vaping status: Never Used  Substance and Sexual Activity   Alcohol use: Yes    Alcohol/week: 1.0 standard drink of alcohol    Types: 1 Glasses of wine per week    Comment: occ   Drug use: No   Sexual activity: Yes  Comment: working with replacement  Other Topics Concern   Not on file  Social History Narrative   Single   Social Drivers of Health   Financial Resource Strain: Low Risk  (09/27/2024)   Overall Financial Resource Strain (CARDIA)    Difficulty of Paying Living Expenses: Not hard at all  Food Insecurity: No Food Insecurity (09/27/2024)   Hunger Vital Sign    Worried About Running Out of Food in the Last Year: Never true    Ran Out of Food in the Last Year: Never true  Transportation Needs: No Transportation Needs (09/27/2024)   PRAPARE - Administrator, Civil Service (Medical): No    Lack of Transportation (Non-Medical): No  Physical Activity: Inactive (09/27/2024)   Exercise Vital Sign    Days of Exercise per Week: 0 days    Minutes of Exercise per Session: 0 min  Stress: No Stress Concern Present (09/27/2024)   [employer] of Occupational Health - Occupational Stress Questionnaire    Feeling of Stress: Only a little  Recent Concern: Stress - Stress Concern Present (08/21/2024)   [employer] of Occupational Health - Occupational Stress Questionnaire    Feeling of Stress: To some extent  Social Connections: Socially Isolated (09/27/2024)   Social Connection and Isolation Panel    Frequency of Communication with Friends and Family: Never    Frequency of Social Gatherings with Friends and Family: Once a week    Attends Religious Services: Never    Database administrator or Organizations: No    Attends Engineer, structural: Never    Marital Status: Divorced    Tobacco Counseling Counseling given: Not  Answered    Clinical Intake:  Pre-visit preparation completed: Yes  Pain : No/denies pain     BMI - recorded: 29.73 Nutritional Status: BMI 25 -29 Overweight Nutritional Risks: None Diabetes: Yes CBG done?: No Did pt. bring in CBG monitor from home?: No  Lab Results  Component Value Date   HGBA1C 6.5 08/22/2024   HGBA1C 5.9 02/10/2024   HGBA1C 5.9 04/01/2023     How often do you need to have someone help you when you read instructions, pamphlets, or other written materials from your doctor or pharmacy?: 1 - Never  Interpreter Needed?: No  Information entered by :: Verdie Saba, CMA   Activities of Daily Living     09/27/2024    8:09 AM 09/26/2024   10:17 AM  In your present state of health, do you have any difficulty performing the following activities:  Hearing? 1 1  Comment wears hearing aids   Vision? 0 0  Difficulty concentrating or making decisions? 0 0  Walking or climbing stairs? 1 1  Dressing or bathing? 0 0  Doing errands, shopping? 0 0  Preparing Food and eating ? N N  Using the Toilet? N N  In the past six months, have you accidently leaked urine? CINDERELLA CINDERELLA  Comment wears a pad   Do you have problems with loss of bowel control? N N  Managing your Medications? N N  Managing your Finances? N N  Housekeeping or managing your Housekeeping? CINDERELLA CINDERELLA    Patient Care Team: Joshua Debby CROME, MD as PCP - General (Internal Medicine) Nahser, Aleene PARAS, MD (Inactive) as PCP - Cardiology (Cardiology) Lita Lye, OD (Optometry) Nahser, Aleene PARAS, MD (Inactive) as Consulting Physician (Cardiology) Ablott, Camellia, OD (Optometry)  I have updated your Care Teams any recent Medical Services you may have received  from other providers in the past year.     Assessment:   This is a routine wellness examination for Vernel.  Hearing/Vision screen Hearing Screening - Comments:: Wears hearing aids Vision Screening - Comments:: Wears rx glasses - up to date with routine eye  exams with Camellia Quay   Goals Addressed               This Visit's Progress     Patient Stated (pt-stated)        Patient stated he plans to continue being active       Depression Screen     09/27/2024    8:10 AM 09/22/2023    4:12 PM 04/01/2023   10:23 AM 12/17/2022   10:15 AM 03/16/2022    9:18 AM 01/23/2022    4:04 PM 08/28/2021    2:02 PM  PHQ 2/9 Scores  PHQ - 2 Score 0 0 0 0 0 0 0  PHQ- 9 Score 0 0 0 0 0  0    Fall Risk     09/27/2024    8:10 AM 09/26/2024   10:17 AM 09/22/2023    4:20 PM 09/20/2023    6:51 PM 04/01/2023   10:23 AM  Fall Risk   Falls in the past year? 1 1 0 0 0  Number falls in past yr: 0 0 0 0 0  Comment 1      Injury with Fall? 0 0 0  0  Risk for fall due to : History of fall(s)  No Fall Risks  No Fall Risks  Follow up Falls evaluation completed;Falls prevention discussed  Falls prevention discussed  Falls evaluation completed    MEDICARE RISK AT HOME:  Medicare Risk at Home Any stairs in or around the home?: Yes If so, are there any without handrails?: No Home free of loose throw rugs in walkways, pet beds, electrical cords, etc?: Yes Adequate lighting in your home to reduce risk of falls?: Yes Life alert?: No Use of a cane, walker or w/c?: No Grab bars in the bathroom?: No Shower chair or bench in shower?: No Elevated toilet seat or a handicapped toilet?: Yes  TIMED UP AND GO:  Was the test performed?  No  Cognitive Function: 6CIT completed    09/22/2023    4:20 PM 02/23/2017   12:51 PM  MMSE - Mini Mental State Exam  Not completed: Unable to complete Refused      05/18/2016    9:16 AM  Montreal Cognitive Assessment   Visuospatial/ Executive (0/5) 5  Naming (0/3) 2  Attention: Read list of digits (0/2) 2  Attention: Read list of letters (0/1) 1  Attention: Serial 7 subtraction starting at 100 (0/3) 3  Language: Repeat phrase (0/2) 2  Language : Fluency (0/1) 1  Abstraction (0/2) 2  Delayed Recall (0/5) 4   Orientation (0/6) 6  Total 28  Adjusted Score (based on education) 28      09/27/2024    8:13 AM 09/22/2023    4:21 PM  6CIT Screen  What Year? 0 points 0 points  What month? 0 points 0 points  What time? 0 points 0 points  Count back from 20 0 points 0 points  Months in reverse 0 points 0 points  Repeat phrase 0 points 0 points  Total Score 0 points 0 points    Immunizations Immunization History  Administered Date(s) Administered   Fluad Quad(high Dose 65+) 09/14/2019, 08/28/2021, 09/16/2022   INFLUENZA, HIGH DOSE SEASONAL  PF 08/10/2017, 08/09/2018, 09/27/2024   Influenza Split 08/15/2012   Influenza Whole 09/06/2009, 10/07/2010   Influenza,inj,Quad PF,6+ Mos 08/31/2013, 10/02/2015   Influenza-Unspecified 09/06/2014, 08/22/2016, 08/24/2023   PFIZER(Purple Top)SARS-COV-2 Vaccination 02/13/2020, 09/07/2020, 04/05/2021, 09/20/2021, 08/24/2023   Pneumococcal Conjugate-13 04/09/2016   Pneumococcal Polysaccharide-23 07/13/2017, 09/16/2022   Td 03/31/2002   Tdap 05/06/2012, 03/17/2022   Zoster Recombinant(Shingrix ) 03/17/2022, 11/21/2022   Zoster, Live 02/13/2013    Screening Tests Health Maintenance  Topic Date Due   COVID-19 Vaccine (6 - 2025-26 season) 08/07/2024   OPHTHALMOLOGY EXAM  12/13/2024   Diabetic kidney evaluation - Urine ACR  02/09/2025   FOOT EXAM  02/09/2025   HEMOGLOBIN A1C  02/19/2025   Diabetic kidney evaluation - eGFR measurement  09/11/2025   Medicare Annual Wellness (AWV)  09/27/2025   Colonoscopy  12/15/2026   DTaP/Tdap/Td (4 - Td or Tdap) 03/17/2032   Pneumococcal Vaccine: 50+ Years  Completed   Hepatitis C Screening  Completed   Zoster Vaccines- Shingrix   Completed   Meningococcal B Vaccine  Aged Out    Health Maintenance Items Addressed:  Vaccines Given today: Influenza High-Dose   Additional Screening:  Vision Screening: Recommended annual ophthalmology exams for early detection of glaucoma and other disorders of the eye. Is the  patient up to date with their annual eye exam?  Yes  Who is the provider or what is the name of the office in which the patient attends annual eye exams? Eric Ablott   Dental Screening: Recommended annual dental exams for proper oral hygiene  Community Resource Referral / Chronic Care Management: CRR required this visit?  No   CCM required this visit?  No   Plan:    I have personally reviewed and noted the following in the patient's chart:   Medical and social history Use of alcohol, tobacco or illicit drugs  Current medications and supplements including opioid prescriptions. Patient is not currently taking opioid prescriptions. Functional ability and status Nutritional status Physical activity Advanced directives List of other physicians Hospitalizations, surgeries, and ER visits in previous 12 months Vitals Screenings to include cognitive, depression, and falls Referrals and appointments  In addition, I have reviewed and discussed with patient certain preventive protocols, quality metrics, and best practice recommendations. A written personalized care plan for preventive services as well as general preventive health recommendations were provided to patient.   Verdie CHRISTELLA Saba, CMA   09/27/2024   After Visit Summary: (In Person-Declined) Patient declined AVS at this time.  Notes: Nothing significant to report at this time.

## 2024-09-27 NOTE — Progress Notes (Unsigned)
 Cardiology Office Note:    Date:  09/29/2024   ID:  Robert Mccall, DOB [birth year], MRN [id number]  PCP:  Joshua Debby CROME, MD  Cardiologist:  Aleene Passe, MD (Inactive)  Electrophysiologist:  None   Referring MD: Joshua Debby CROME, MD   Chief Complaint  Patient presents with   Coronary Artery Disease    History of Present Illness:    Robert Mccall is a 73 y.o. male with a hx of CAD, chronic diastolic heart failure, hypertension, hyperlipidemia, TIA who presents for follow-up.  Previously followed with Dr. Passe.  Coronary CTA in 2021 showed moderate disease in proximal to mid LAD, negative by FFR.  In 01/2022 LHC showed 90% mid LAD stenosis with 90% D2 stenosis, status post DES of LAD and PTCA of D2.  Since last clinic visit, he reports he is doing okay.  States that he had COVID last month and had palpitations where his heart rate was going in 130s.  Has improved but continues to have intermittent palpitations.  States that he has pain between his shoulder blades that he has trouble telling if coming from his back or chest.  He does not exercise.  Does report he gets short of breath walking up a flight of stairs.  He is taking DAPT, denies any bleeding issues.   Past Medical History:  Diagnosis Date   ADHD (attention deficit hyperactivity disorder)    Anxiety    Benign prostatic hypertrophy    Chronic lower back pain    surgery schedule for back 10/21/16   Cyst of right kidney    incidental Bosiak 1.3cm on R (MRI abd 09/2014), follows with uro   Depression    GERD (gastroesophageal reflux disease)    Glaucoma    Hard of hearing    History of migraine headaches    no meds   Hyperlipidemia    Hypertension    Menetrier's disease     Past Surgical History:  Procedure Laterality Date   COLONOSCOPY  09/2007   polyps/ Debrah   HYDROCELE EXCISION Right 1990   LEFT HEART CATH AND CORONARY ANGIOGRAPHY N/A 01/07/2022   Procedure: LEFT HEART CATH AND CORONARY  ANGIOGRAPHY;  Surgeon: Anner Alm ORN, MD;  Location: [facility] INVASIVE CV LAB;  Service: Cardiovascular;  Laterality: N/A;   LUMBAR DISC SURGERY  10/21/2016   ORCHIECTOMY Left 1970   WISDOM TOOTH EXTRACTION      Current Medications: Current Meds  Medication Sig   alfuzosin  (UROXATRAL ) 10 MG 24 hr tablet Take 1 tablet (10 mg total) by mouth daily with breakfast.   aspirin  EC 81 MG tablet Take 1 tablet (81 mg total) by mouth daily. Swallow whole.   Cholecalciferol (VITAMIN D ) 50 MCG (2000 UT) CAPS Take 1 capsule (2,000 Units total) by mouth daily.   clopidogrel  (PLAVIX ) 75 MG tablet Take 1 tablet (75 mg total) by mouth daily.   Docusate Sodium 100 MG capsule Take 100 mg by mouth 2 (two) times daily.   DULoxetine  (CYMBALTA ) 30 MG capsule Take 1 capsule (30 mg total) by mouth daily.   empagliflozin  (JARDIANCE ) 10 MG TABS tablet Take 1 tablet (10 mg total) by mouth daily before breakfast.   eszopiclone  (LUNESTA ) 2 MG TABS tablet Take 1 tablet (2 mg total) by mouth at bedtime as needed for sleep. Take immediately before bedtime   famotidine  (PEPCID ) 20 MG tablet Take 1 tablet (20 mg total) by mouth daily after supper   finasteride  (PROSCAR ) 5 MG tablet Take 1  tablet (5 mg total) by mouth daily.   ketoconazole  (NIZORAL ) 2 % shampoo Apply 1 Application topically 2 (two) times a week. Allow to sit on Scalp for about 30 seconds to 1 minute   lamoTRIgine  (LAMICTAL ) 25 MG tablet Take 1 tablet (25 mg total) by mouth 2 (two) times daily.   metoprolol  tartrate (LOPRESSOR ) 25 MG tablet Take 1.5 tablets (37.5 mg total) by mouth 2 (two) times daily.   Multiple Vitamins-Minerals (ICAPS AREDS 2 PO) Take by mouth.   nitroGLYCERIN  (NITROSTAT ) 0.4 MG SL tablet Place 1 tablet (0.4 mg total) under the tongue every 5 (five) minutes as needed for chest pain (Call 911 if 3 doses does not relieve pain.).   olmesartan  (BENICAR ) 40 MG tablet Take 1 tablet (40 mg total) by mouth daily.   pantoprazole  (PROTONIX ) 40 MG tablet  Take 1 tablet (40 mg total) by mouth daily. Take 30-60 min before first meal of the day   Probiotic Product (PROBIOTIC-10 PO) Take 1 capsule by mouth daily.   rosuvastatin  (CRESTOR ) 20 MG tablet Take 1 tablet (20 mg total) by mouth daily.   vitamin B-12 (CYANOCOBALAMIN ) 1000 MCG tablet Take 1,000 mcg by mouth daily.     Allergies:   Patient has no known allergies.   Social History   Socioeconomic History   Marital status: Single    Spouse name: Not on file   Number of children: Not on file   Years of education: Not on file   Highest education level: Master's degree (e.g., MA, MS, MEng, MEd, MSW, MBA)  Occupational History   Occupation: Production designer, theatre/television/film    Comment: [employer]  Tobacco Use   Smoking status: Never    Passive exposure: Never   Smokeless tobacco: Never  Vaping Use   Vaping status: Never Used  Substance and Sexual Activity   Alcohol use: Yes    Alcohol/week: 1.0 standard drink of alcohol    Types: 1 Glasses of wine per week    Comment: occ   Drug use: No   Sexual activity: Yes    Comment: working with replacement  Other Topics Concern   Not on file  Social History Narrative   Single   Social Drivers of Health   Financial Resource Strain: Low Risk  (09/27/2024)   Overall Financial Resource Strain (CARDIA)    Difficulty of Paying Living Expenses: Not hard at all  Food Insecurity: No Food Insecurity (09/27/2024)   Hunger Vital Sign    Worried About Running Out of Food in the Last Year: Never true    Ran Out of Food in the Last Year: Never true  Transportation Needs: No Transportation Needs (09/27/2024)   PRAPARE - Administrator, Civil Service (Medical): No    Lack of Transportation (Non-Medical): No  Physical Activity: Inactive (09/27/2024)   Exercise Vital Sign    Days of Exercise per Week: 0 days    Minutes of Exercise per Session: 0 min  Stress: No Stress Concern Present (09/27/2024)   [employer] of Occupational Health -  Occupational Stress Questionnaire    Feeling of Stress: Only a little  Recent Concern: Stress - Stress Concern Present (08/21/2024)   [employer] of Occupational Health - Occupational Stress Questionnaire    Feeling of Stress: To some extent  Social Connections: Socially Isolated (09/27/2024)   Social Connection and Isolation Panel    Frequency of Communication with Friends and Family: Never    Frequency of Social Gatherings with Friends and Family:  Once a week    Attends Religious Services: Never    Active Member of Clubs or Organizations: No    Attends Banker Meetings: Never    Marital Status: Divorced     Family History: The patient's family history includes Breast cancer in his maternal grandmother; Breast cancer (age of onset: 58) in his mother; COPD in his mother; Cancer in his father; Depression in his mother; Diabetes in his father; Heart disease in his mother; Macular degeneration in his father; Osteoarthritis in his mother. There is no history of Colon cancer, Esophageal cancer, Rectal cancer, or Stomach cancer.  ROS:   Please see the history of present illness.     All other systems reviewed and are negative.  EKGs/Labs/Other Studies Reviewed:    The following studies were reviewed today:   EKG:  EKG is not ordered today.    Recent Labs: 02/10/2024: TSH 1.86 09/11/2024: ALT 25; BUN 16; Creatinine, Ser 1.22; Hemoglobin 15.9; Platelets 268; Potassium 4.0; Sodium 138  Recent Lipid Panel    Component Value Date/Time   CHOL 112 02/10/2024 1538   CHOL 107 05/27/2020 1239   TRIG 164.0 (H) 02/10/2024 1538   HDL 42.00 02/10/2024 1538   HDL 31 (L) 05/27/2020 1239   CHOLHDL 3 02/10/2024 1538   VLDL 32.8 02/10/2024 1538   LDLCALC 37 02/10/2024 1538   LDLCALC 52 05/27/2020 1239   LDLDIRECT 76.0 11/29/2019 0901    Physical Exam:    VS:  BP 136/76 (BP Location: Left Arm, Patient Position: Sitting, Cuff Size: Normal)   Pulse 74   Ht 5' 4 (1.626 m)   Wt  172 lb 9.6 oz (78.3 kg)   SpO2 95%   BMI 29.63 kg/m     Wt Readings from Last 3 Encounters:  09/28/24 172 lb 9.6 oz (78.3 kg)  09/27/24 173 lb 3.2 oz (78.6 kg)  09/21/24 173 lb 12.8 oz (78.8 kg)     GEN:  Well nourished, well developed in no acute distress HEENT: Normal NECK: No JVD; No carotid bruits LYMPHATICS: No lymphadenopathy CARDIAC: RRR, no murmurs, rubs, gallops RESPIRATORY:  Clear to auscultation without rales, wheezing or rhonchi  ABDOMEN: Soft, non-tender, non-distended MUSCULOSKELETAL:  No edema; No deformity  SKIN: Warm and dry NEUROLOGIC:  Alert and oriented x 3 PSYCHIATRIC:  Normal affect   ASSESSMENT:    1. Chest pain of uncertain etiology   2. Coronary artery disease involving native coronary artery of native heart, unspecified whether angina present   3. Palpitations   4. Essential hypertension   5. Hyperlipidemia, unspecified hyperlipidemia type    PLAN:    CAD: In 01/2022 LHC showed 90% mid LAD stenosis with 90% D2 stenosis, status post DES of LAD and PTCA of D2. - Continue aspirin  81 mg daily and Plavix  75 mg daily - Continue metoprolol  37.5 mg twice daily - Continue rosuvastatin  20 mg daily - He is reporting pain between his shoulder blades and that he has trouble telling if coming from his chest or back.  Also having dyspnea on exertion that could represent anginal equivalent.  Recommend stress PET for further evaluation - Check echocardiogram to rule out structural heart disease  Palpitations: Description concerning for arrhythmia, evaluate with Zio patch x 2 weeks  Hypertension: On olmesartan  40 mg daily and metoprolol  37.5 mg twice daily.  Appears controlled  Hyperlipidemia: On rosuvastatin  20 mg daily.  LDL 37 on 02/10/24  RTC in 4 months   Informed Consent   Shared Decision  Making/Informed Consent The risks [chest pain, shortness of breath, cardiac arrhythmias, dizziness, blood pressure fluctuations, myocardial infarction, stroke/transient  ischemic attack, nausea, vomiting, allergic reaction, radiation exposure, metallic taste sensation and life-threatening complications (estimated to be 1 in 10,000)], benefits (risk stratification, diagnosing coronary artery disease, treatment guidance) and alternatives of a cardiac PET stress test were discussed in detail with Mr. Canning and he agrees to proceed.      Medication Adjustments/Labs and Tests Ordered: Current medicines are reviewed at length with the patient today.  Concerns regarding medicines are outlined above.  Orders Placed This Encounter  Procedures   NM PET CT CARDIAC PERFUSION MULTI W/ABSOLUTE BLOODFLOW   LONG TERM MONITOR (3-14 DAYS)   ECHOCARDIOGRAM COMPLETE   No orders of the defined types were placed in this encounter.   Patient Instructions  Medication Instructions:  Your physician recommends that you continue on your current medications as directed. Please refer to the Current Medication list given to you today.  *If you need a refill on your cardiac medications before your next appointment, please call your pharmacy*  Lab Work: Echo  Your physician has requested that you have an echocardiogram. Echocardiography is a painless test that uses sound waves to create images of your heart. It provides your doctor with information about the size and shape of your heart and how well your heart's chambers and valves are working. This procedure takes approximately one hour. There are no restrictions for this procedure. Please do NOT wear cologne, perfume, aftershave, or lotions (deodorant is allowed). Please arrive 15 minutes prior to your appointment time.  Please note: We ask at that you not bring children with you during ultrasound (echo/ vascular) testing. Due to room size and safety concerns, children are not allowed in the ultrasound rooms during exams. Our front office staff cannot provide observation of children in our lobby area while testing is being conducted.  An adult accompanying a patient to their appointment will only be allowed in the ultrasound room at the discretion of the ultrasound technician under special circumstances. We apologize for any inconvenience.  If you have labs (blood work) drawn today and your tests are completely normal, you will receive your results only by: MyChart Message (if you have MyChart) OR A paper copy in the mail If you have any lab test that is abnormal or we need to change your treatment, we will call you to review the results.  Testing/Procedures: Zio  ZIO XT- Long Term Monitor Instructions  Your physician has requested you wear a ZIO patch monitor for 14 days.  This is a single patch monitor. [employer] supplies one patch monitor per enrollment. Additional stickers are not available. Please do not apply patch if you will be having a Nuclear Stress Test,  Echocardiogram, Cardiac CT, MRI, or Chest Xray during the period you would be wearing the  monitor. The patch cannot be worn during these tests. You cannot remove and re-apply the  ZIO XT patch monitor.  Your ZIO patch monitor will be mailed 3 day USPS to your address on file. It may take 3-5 days  to receive your monitor after you have been enrolled.  Once you have received your monitor, please review the enclosed instructions. Your monitor  has already been registered assigning a specific monitor serial # to you.  Billing and Patient Assistance Program Information  We have supplied [employer] with any of your insurance information on file for billing purposes. [employer] offers a sliding scale Patient Assistance Program for  patients that do not have  insurance, or whose insurance does not completely cover the cost of the ZIO monitor.  You must apply for the Patient Assistance Program to qualify for this discounted rate.  To apply, please call [employer] at [phone], select option 4, select option 2, ask to apply for  Patient Assistance Program. Meredeth will ask  your household income, and how many people  are in your household. They will quote your out-of-pocket cost based on that information.  [employer] will also be able to set up a 33-month, interest-free payment plan if needed.  Applying the monitor   Shave hair from upper left chest.  Hold abrader disc by orange tab. Rub abrader in 40 strokes over the upper left chest as  indicated in your monitor instructions.  Clean area with 4 enclosed alcohol pads. Let dry.  Apply patch as indicated in monitor instructions. Patch will be placed under collarbone on left  side of chest with arrow pointing upward.  Rub patch adhesive wings for 2 minutes. Remove white label marked 1. Remove the white  label marked 2. Rub patch adhesive wings for 2 additional minutes.  While looking in a mirror, press and release button in center of patch. A small green light will  flash 3-4 times. This will be your only indicator that the monitor has been turned on.  Do not shower for the first 24 hours. You may shower after the first 24 hours.  Press the button if you feel a symptom. You will hear a small click. Record Date, Time and  Symptom in the Patient Logbook.  When you are ready to remove the patch, follow instructions on the last 2 pages of Patient  Logbook. Stick patch monitor onto the last page of Patient Logbook.  Place Patient Logbook in the blue and white box. Use locking tab on box and tape box closed  securely. The blue and white box has prepaid postage on it. Please place it in the mailbox as  soon as possible. Your physician should have your test results approximately 7 days after the  monitor has been mailed back to [facility].  Call [facility] Customer Care at [phone] if you have questions regarding  your ZIO XT patch monitor. Call them immediately if you see an orange light blinking on your  monitor.  If your monitor falls off in less than 4 days, contact our Monitor department at  [phone].  If your monitor becomes loose or falls off after 4 days call [employer] at [phone] for  suggestions on securing your monitor   Follow-Up: At [facility], you and your health needs are our priority.  As part of our continuing mission to provide you with exceptional heart care, our providers are all part of one team.  This team includes your primary Cardiologist (physician) and Advanced Practice Providers or APPs (Physician Assistants and Nurse Practitioners) who all work together to provide you with the care you need, when you need it.  Your next appointment:   4 months  Provider:   Dr. Kate  We recommend signing up for the patient portal called MyChart.  Sign up information is provided on this After Visit Summary.  MyChart is used to connect with patients for Virtual Visits (Telemedicine).  Patients are able to view lab/test results, encounter notes, upcoming appointments, etc.  Non-urgent messages can be sent to your provider as well.   To learn more about what you can do with MyChart, go to [email or web address].  Other Instructions    Please report to Radiology at the [facility] Main Entrance 30 minutes early for your test.  [address] [place], [place] [address]                        How to Prepare for Your Cardiac PET/CT Stress Test:  Nothing to eat or drink, except water, 3 hours prior to arrival time.  NO caffeine/decaffeinated products, or chocolate 12 hours prior to arrival. (Please note decaffeinated beverages (teas/coffees) still contain caffeine).  If you have caffeine within 12 hours prior, the test will need to be rescheduled.  Medication instructions: Do not take erectile dysfunction medications for 72 hours prior to test (sildenafil, tadalafil) Do not take nitrates (isosorbide mononitrate, Ranexa) the day before or day of test Do not take tamsulosin the day before or morning of test Hold theophylline containing  medications for 12 hours. Hold Dipyridamole 48 hours prior to the test.  Diabetic Preparation: If able to eat breakfast prior to 3 hour fasting, you may take all medications, including your insulin. Do not worry if you miss your breakfast dose of insulin - start at your next meal. If you do not eat prior to 3 hour fast-Hold all diabetes (oral and insulin) medications. Patients who wear a continuous glucose monitor MUST remove the device prior to scanning.  You may take your remaining medications with water.  NO perfume, cologne or lotion on chest or abdomen area.   Total time is 1 to 2 hours; you may want to bring reading material for the waiting time.  IF YOU THINK YOU MAY BE PREGNANT, OR ARE NURSING PLEASE INFORM THE TECHNOLOGIST.  In preparation for your appointment, medication and supplies will be purchased.  Appointment availability is limited, so if you need to cancel or reschedule, please call the Radiology Department Scheduler at [phone] 24 hours in advance to avoid a cancellation fee of $100.00  What to Expect When you Arrive:  Once you arrive and check in for your appointment, you will be taken to a preparation room within the Radiology Department.  A technologist or Nurse will obtain your medical history, verify that you are correctly prepped for the exam, and explain the procedure.  Afterwards, an IV will be started in your arm and electrodes will be placed on your skin for EKG monitoring during the stress portion of the exam. Then you will be escorted to the PET/CT scanner.  There, staff will get you positioned on the scanner and obtain a blood pressure and EKG.  During the exam, you will continue to be connected to the EKG and blood pressure machines.  A small, safe amount of a radioactive tracer will be injected in your IV to obtain a series of pictures of your heart along with an injection of a stress agent.    After your Exam:  It is recommended that you eat a meal and  drink a caffeinated beverage to counter act any effects of the stress agent.  Drink plenty of fluids for the remainder of the day and urinate frequently for the first couple of hours after the exam.  Your doctor will inform you of your test results within 7-10 business days.  For more information and frequently asked questions, please visit our website: [email or web address]  For questions about your test or how to prepare for your test, please call: Cardiac Imaging Nurse Navigators Office: [phone]  Signed, Lonni LITTIE Nanas, MD  09/29/2024 5:46 PM    [facility]

## 2024-09-28 ENCOUNTER — Ambulatory Visit

## 2024-09-28 ENCOUNTER — Encounter: Payer: Self-pay | Admitting: Cardiology

## 2024-09-28 ENCOUNTER — Ambulatory Visit: Attending: Internal Medicine | Admitting: Cardiology

## 2024-09-28 VITALS — BP 136/76 | HR 74 | Ht 64.0 in | Wt 172.6 lb

## 2024-09-28 DIAGNOSIS — R002 Palpitations: Secondary | ICD-10-CM

## 2024-09-28 DIAGNOSIS — R079 Chest pain, unspecified: Secondary | ICD-10-CM | POA: Diagnosis not present

## 2024-09-28 DIAGNOSIS — I2510 Atherosclerotic heart disease of native coronary artery without angina pectoris: Secondary | ICD-10-CM | POA: Diagnosis not present

## 2024-09-28 DIAGNOSIS — I10 Essential (primary) hypertension: Secondary | ICD-10-CM | POA: Diagnosis not present

## 2024-09-28 DIAGNOSIS — E785 Hyperlipidemia, unspecified: Secondary | ICD-10-CM

## 2024-09-28 NOTE — Patient Instructions (Signed)
 Medication Instructions:  Your physician recommends that you continue on your current medications as directed. Please refer to the Current Medication list given to you today.  *If you need a refill on your cardiac medications before your next appointment, please call your pharmacy*  Lab Work: Echo  Your physician has requested that you have an echocardiogram. Echocardiography is a painless test that uses sound waves to create images of your heart. It provides your doctor with information about the size and shape of your heart and how well your heart's chambers and valves are working. This procedure takes approximately one hour. There are no restrictions for this procedure. Please do NOT wear cologne, perfume, aftershave, or lotions (deodorant is allowed). Please arrive 15 minutes prior to your appointment time.  Please note: We ask at that you not bring children with you during ultrasound (echo/ vascular) testing. Due to room size and safety concerns, children are not allowed in the ultrasound rooms during exams. Our front office staff cannot provide observation of children in our lobby area while testing is being conducted. An adult accompanying a patient to their appointment will only be allowed in the ultrasound room at the discretion of the ultrasound technician under special circumstances. We apologize for any inconvenience.  If you have labs (blood work) drawn today and your tests are completely normal, you will receive your results only by: MyChart Message (if you have MyChart) OR A paper copy in the mail If you have any lab test that is abnormal or we need to change your treatment, we will call you to review the results.  Testing/Procedures: Zio  ZIO XT- Long Term Monitor Instructions  Your physician has requested you wear a ZIO patch monitor for 14 days.  This is a single patch monitor. [employer] supplies one patch monitor per enrollment. Additional stickers are not available. Please  do not apply patch if you will be having a Nuclear Stress Test,  Echocardiogram, Cardiac CT, MRI, or Chest Xray during the period you would be wearing the  monitor. The patch cannot be worn during these tests. You cannot remove and re-apply the  ZIO XT patch monitor.  Your ZIO patch monitor will be mailed 3 day USPS to your address on file. It may take 3-5 days  to receive your monitor after you have been enrolled.  Once you have received your monitor, please review the enclosed instructions. Your monitor  has already been registered assigning a specific monitor serial # to you.  Billing and Patient Assistance Program Information  We have supplied [employer] with any of your insurance information on file for billing purposes. [employer] offers a sliding scale Patient Assistance Program for patients that do not have  insurance, or whose insurance does not completely cover the cost of the ZIO monitor.  You must apply for the Patient Assistance Program to qualify for this discounted rate.  To apply, please call [employer] at [phone], select option 4, select option 2, ask to apply for  Patient Assistance Program. Meredeth will ask your household income, and how many people  are in your household. They will quote your out-of-pocket cost based on that information.  [employer] will also be able to set up a 53-month, interest-free payment plan if needed.  Applying the monitor   Shave hair from upper left chest.  Hold abrader disc by orange tab. Rub abrader in 40 strokes over the upper left chest as  indicated in your monitor instructions.  Clean area with 4 enclosed alcohol  pads. Let dry.  Apply patch as indicated in monitor instructions. Patch will be placed under collarbone on left  side of chest with arrow pointing upward.  Rub patch adhesive wings for 2 minutes. Remove white label marked 1. Remove the white  label marked 2. Rub patch adhesive wings for 2 additional minutes.  While looking in a  mirror, press and release button in center of patch. A small green light will  flash 3-4 times. This will be your only indicator that the monitor has been turned on.  Do not shower for the first 24 hours. You may shower after the first 24 hours.  Press the button if you feel a symptom. You will hear a small click. Record Date, Time and  Symptom in the Patient Logbook.  When you are ready to remove the patch, follow instructions on the last 2 pages of Patient  Logbook. Stick patch monitor onto the last page of Patient Logbook.  Place Patient Logbook in the blue and white box. Use locking tab on box and tape box closed  securely. The blue and white box has prepaid postage on it. Please place it in the mailbox as  soon as possible. Your physician should have your test results approximately 7 days after the  monitor has been mailed back to [facility].  Call [facility] Customer Care at [phone] if you have questions regarding  your ZIO XT patch monitor. Call them immediately if you see an orange light blinking on your  monitor.  If your monitor falls off in less than 4 days, contact our Monitor department at [phone].  If your monitor becomes loose or falls off after 4 days call [employer] at [phone] for  suggestions on securing your monitor   Follow-Up: At [facility], you and your health needs are our priority.  As part of our continuing mission to provide you with exceptional heart care, our providers are all part of one team.  This team includes your primary Cardiologist (physician) and Advanced Practice Providers or APPs (Physician Assistants and Nurse Practitioners) who all work together to provide you with the care you need, when you need it.  Your next appointment:   4 months  Provider:   Dr. Kate  We recommend signing up for the patient portal called MyChart.  Sign up information is provided on this After Visit Summary.  MyChart is used to connect  with patients for Virtual Visits (Telemedicine).  Patients are able to view lab/test results, encounter notes, upcoming appointments, etc.  Non-urgent messages can be sent to your provider as well.   To learn more about what you can do with MyChart, go to [email or web address].   Other Instructions    Please report to Radiology at the [facility] Main Entrance 30 minutes early for your test.  [address] [place], [place] [address]                        How to Prepare for Your Cardiac PET/CT Stress Test:  Nothing to eat or drink, except water, 3 hours prior to arrival time.  NO caffeine/decaffeinated products, or chocolate 12 hours prior to arrival. (Please note decaffeinated beverages (teas/coffees) still contain caffeine).  If you have caffeine within 12 hours prior, the test will need to be rescheduled.  Medication instructions: Do not take erectile dysfunction medications for 72 hours prior to test (sildenafil, tadalafil) Do not take nitrates (isosorbide mononitrate, Ranexa) the day before  or day of test Do not take tamsulosin the day before or morning of test Hold theophylline containing medications for 12 hours. Hold Dipyridamole 48 hours prior to the test.  Diabetic Preparation: If able to eat breakfast prior to 3 hour fasting, you may take all medications, including your insulin. Do not worry if you miss your breakfast dose of insulin - start at your next meal. If you do not eat prior to 3 hour fast-Hold all diabetes (oral and insulin) medications. Patients who wear a continuous glucose monitor MUST remove the device prior to scanning.  You may take your remaining medications with water.  NO perfume, cologne or lotion on chest or abdomen area.   Total time is 1 to 2 hours; you may want to bring reading material for the waiting time.  IF YOU THINK YOU MAY BE PREGNANT, OR ARE NURSING PLEASE INFORM THE TECHNOLOGIST.  In preparation for your appointment,  medication and supplies will be purchased.  Appointment availability is limited, so if you need to cancel or reschedule, please call the Radiology Department Scheduler at [phone] 24 hours in advance to avoid a cancellation fee of $100.00  What to Expect When you Arrive:  Once you arrive and check in for your appointment, you will be taken to a preparation room within the Radiology Department.  A technologist or Nurse will obtain your medical history, verify that you are correctly prepped for the exam, and explain the procedure.  Afterwards, an IV will be started in your arm and electrodes will be placed on your skin for EKG monitoring during the stress portion of the exam. Then you will be escorted to the PET/CT scanner.  There, staff will get you positioned on the scanner and obtain a blood pressure and EKG.  During the exam, you will continue to be connected to the EKG and blood pressure machines.  A small, safe amount of a radioactive tracer will be injected in your IV to obtain a series of pictures of your heart along with an injection of a stress agent.    After your Exam:  It is recommended that you eat a meal and drink a caffeinated beverage to counter act any effects of the stress agent.  Drink plenty of fluids for the remainder of the day and urinate frequently for the first couple of hours after the exam.  Your doctor will inform you of your test results within 7-10 business days.  For more information and frequently asked questions, please visit our website: [email or web address]  For questions about your test or how to prepare for your test, please call: Cardiac Imaging Nurse Navigators Office: [phone]

## 2024-09-28 NOTE — Progress Notes (Unsigned)
 Enrolled patient for a 14 day Zio XT  monitor to be mailed to patients home

## 2024-10-05 ENCOUNTER — Other Ambulatory Visit (HOSPITAL_COMMUNITY): Payer: Self-pay

## 2024-10-05 ENCOUNTER — Ambulatory Visit (INDEPENDENT_AMBULATORY_CARE_PROVIDER_SITE_OTHER): Admitting: Internal Medicine

## 2024-10-05 ENCOUNTER — Encounter: Payer: Self-pay | Admitting: Internal Medicine

## 2024-10-05 VITALS — BP 134/76 | HR 78 | Temp 98.4°F | Ht 64.0 in | Wt 174.4 lb

## 2024-10-05 DIAGNOSIS — I10 Essential (primary) hypertension: Secondary | ICD-10-CM

## 2024-10-05 DIAGNOSIS — F3341 Major depressive disorder, recurrent, in partial remission: Secondary | ICD-10-CM | POA: Diagnosis not present

## 2024-10-05 DIAGNOSIS — E1159 Type 2 diabetes mellitus with other circulatory complications: Secondary | ICD-10-CM

## 2024-10-05 DIAGNOSIS — E119 Type 2 diabetes mellitus without complications: Secondary | ICD-10-CM | POA: Insufficient documentation

## 2024-10-05 MED ORDER — LAMOTRIGINE ER 100 MG PO TB24
100.0000 mg | ORAL_TABLET | Freq: Every day | ORAL | 1 refills | Status: DC
  Filled 2024-10-05: qty 90, 90d supply, fill #0
  Filled 2024-12-27: qty 90, 90d supply, fill #1

## 2024-10-05 NOTE — Patient Instructions (Signed)

## 2024-10-05 NOTE — Progress Notes (Addendum)
 Subjective:  Patient ID: Robert Mccall, male    DOB: 06-Apr-1951  Age: 73 y.o. MRN: [id number]  CC: Depression, Hypertension, Coronary Artery Disease, and Congestive Heart Failure   HPI Robert Mccall presents for f/up ---  Discussed the use of AI scribe software for clinical note transcription with the patient, who gave verbal consent to proceed.  History of Present Illness Robert Mccall is a 73 year old male who presents for follow-up after recovering from COVID-19.  He has recovered from COVID-19, initially experiencing severe symptoms that improved within a week. He had a cough with mucus for about a week and a half, which has resolved. He continues to experience reduced taste, noting that certain things don't taste as well as he used to.  He has a history of mood disorder previously managed with methylphenidate  and lamotrigine . Methylphenidate  was discontinued due to heart racing, leading to increased patterning behaviors such as foot and teeth tapping. He is managing well without it and prefers not to restart it. He continues to take lamotrigine .  He experiences chest pains and is undergoing tests with a new cardiologist after his previous cardiologist retired. He sometimes attributes the chest pain to back pain. He has not discussed the safety of restarting methylphenidate  with his cardiologist.  He has chronic back pain due to degenerative spine issues and has undergone two surgeries on his neck and lower back. He frequently takes acetaminophen  for pain relief and avoids Advil. He describes his spine as curving and degenerating.  He experiences gastrointestinal distress, with bowel movements that are sometimes difficult to initiate and vary between being watery and more solid. He does not take any medication to control his bowel movements but notes that he can still go to the bathroom, although not as regularly as before. The patient reports that his bowel movements are  sometimes hard to initiate and frequently vary between being watery and more solid, but he did not specifically mention nausea, vomiting, or diarrhea.     Outpatient Medications Prior to Visit  Medication Sig Dispense Refill   alfuzosin  (UROXATRAL ) 10 MG 24 hr tablet Take 1 tablet (10 mg total) by mouth daily with breakfast. 90 tablet 1   aspirin  EC 81 MG tablet Take 1 tablet (81 mg total) by mouth daily. Swallow whole. 90 tablet 1   Cholecalciferol (VITAMIN D ) 50 MCG (2000 UT) CAPS Take 1 capsule (2,000 Units total) by mouth daily. 30 capsule 11   clopidogrel  (PLAVIX ) 75 MG tablet Take 1 tablet (75 mg total) by mouth daily. 90 tablet 2   Docusate Sodium 100 MG capsule Take 100 mg by mouth 2 (two) times daily.     empagliflozin  (JARDIANCE ) 10 MG TABS tablet Take 1 tablet (10 mg total) by mouth daily before breakfast. 90 tablet 0   eszopiclone  (LUNESTA ) 2 MG TABS tablet Take 1 tablet (2 mg total) by mouth at bedtime as needed for sleep. Take immediately before bedtime 90 tablet 0   famotidine  (PEPCID ) 20 MG tablet Take 1 tablet (20 mg total) by mouth daily after supper 90 tablet 0   finasteride  (PROSCAR ) 5 MG tablet Take 1 tablet (5 mg total) by mouth daily. 90 tablet 1   ketoconazole  (NIZORAL ) 2 % shampoo Apply 1 Application topically 2 (two) times a week. Allow to sit on Scalp for about 30 seconds to 1 minute 120 mL 6   metoprolol  tartrate (LOPRESSOR ) 25 MG tablet Take 1.5 tablets (37.5 mg total) by mouth 2 (two) times  daily. 270 tablet 0   Multiple Vitamins-Minerals (ICAPS AREDS 2 PO) Take by mouth.     nitroGLYCERIN  (NITROSTAT ) 0.4 MG SL tablet Place 1 tablet (0.4 mg total) under the tongue every 5 (five) minutes as needed for chest pain (Call 911 if 3 doses does not relieve pain.). 25 tablet 3   olmesartan  (BENICAR ) 40 MG tablet Take 1 tablet (40 mg total) by mouth daily. 90 tablet 0   pantoprazole  (PROTONIX ) 40 MG tablet Take 1 tablet (40 mg total) by mouth daily. Take 30-60 min before  first meal of the day 90 tablet 0   Probiotic Product (PROBIOTIC-10 PO) Take 1 capsule by mouth daily.     rosuvastatin  (CRESTOR ) 20 MG tablet Take 1 tablet (20 mg total) by mouth daily. 90 tablet 0   vitamin B-12 (CYANOCOBALAMIN ) 1000 MCG tablet Take 1,000 mcg by mouth daily.     DULoxetine  (CYMBALTA ) 30 MG capsule Take 1 capsule (30 mg total) by mouth daily. 90 capsule 0   lamoTRIgine  (LAMICTAL ) 25 MG tablet Take 1 tablet (25 mg total) by mouth 2 (two) times daily. 180 tablet 0   No facility-administered medications prior to visit.    ROS Review of Systems  Constitutional:  Positive for fatigue. Negative for appetite change, chills, diaphoresis and unexpected weight change.  HENT: Negative.    Eyes: Negative.   Respiratory: Negative.  Negative for cough, chest tightness, shortness of breath and wheezing.   Cardiovascular:  Negative for chest pain, palpitations and leg swelling.  Gastrointestinal: Negative.  Negative for abdominal pain, constipation, diarrhea, nausea and vomiting.  Endocrine: Negative.   Genitourinary: Negative.  Negative for difficulty urinating.  Musculoskeletal: Negative.  Negative for arthralgias and myalgias.  Skin: Negative.   Neurological: Negative.  Negative for dizziness, seizures, weakness and light-headedness.  Hematological:  Negative for adenopathy. Does not bruise/bleed easily.  Psychiatric/Behavioral:  Positive for dysphoric mood. Negative for self-injury, sleep disturbance and suicidal ideas. The patient is not nervous/anxious.     Objective:  BP 134/76 (BP Location: Left Arm, Patient Position: Sitting, Cuff Size: Normal)   Pulse 78   Temp 98.4 F (36.9 C) (Oral)   Ht 5' 4 (1.626 m)   Wt 174 lb 6.4 oz (79.1 kg)   SpO2 95%   BMI 29.94 kg/m   BP Readings from Last 3 Encounters:  10/05/24 134/76  09/28/24 136/76  09/27/24 130/72    Wt Readings from Last 3 Encounters:  10/05/24 174 lb 6.4 oz (79.1 kg)  09/28/24 172 lb 9.6 oz (78.3 kg)   09/27/24 173 lb 3.2 oz (78.6 kg)    Physical Exam Vitals reviewed.  HENT:     Head: Normocephalic.     Nose: Nose normal.     Mouth/Throat:     Mouth: Mucous membranes are moist.  Eyes:     General: No scleral icterus.    Pupils: Pupils are equal, round, and reactive to light.  Cardiovascular:     Rate and Rhythm: Normal rate and regular rhythm.     Heart sounds: No murmur heard.    No friction rub. No gallop.  Pulmonary:     Effort: Pulmonary effort is normal.     Breath sounds: No stridor. No wheezing, rhonchi or rales.  Abdominal:     General: Abdomen is flat.     Palpations: There is no mass.     Tenderness: There is no abdominal tenderness. There is no guarding.     Hernia: No hernia is present.  Musculoskeletal:        General: Normal range of motion.     Cervical back: Neck supple.     Right lower leg: No edema.     Left lower leg: No edema.  Lymphadenopathy:     Cervical: No cervical adenopathy.  Skin:    General: Skin is warm and dry.  Neurological:     General: No focal deficit present.  Psychiatric:        Mood and Affect: Mood normal.        Behavior: Behavior normal.     Lab Results  Component Value Date   WBC 7.4 09/11/2024   HGB 15.9 09/11/2024   HCT 45.8 09/11/2024   PLT 268 09/11/2024   GLUCOSE 107 (H) 09/11/2024   CHOL 112 02/10/2024   TRIG 164.0 (H) 02/10/2024   HDL 42.00 02/10/2024   LDLDIRECT 76.0 11/29/2019   LDLCALC 37 02/10/2024   ALT 25 09/11/2024   AST 24 09/11/2024   NA 138 09/11/2024   K 4.0 09/11/2024   CL 104 09/11/2024   CREATININE 1.22 09/11/2024   BUN 16 09/11/2024   CO2 28 09/11/2024   TSH 1.86 02/10/2024   PSA 0.24 02/10/2024   INR 1.04 01/23/2017   HGBA1C 6.5 08/22/2024   MICROALBUR 2.5 (H) 02/10/2024    No results found.  Assessment & Plan:   Essential hypertension- BP is well controlled.  Recurrent major depressive disorder, in partial remission- Will discontinue duloxetine  and increase the lamotrigine   dose. -     lamoTRIgine  ER; Take 1 tablet (100 mg total) by mouth daily.  Dispense: 90 tablet; Refill: 1  Type 2 diabetes mellitus with other circulatory complication, without long-term current use of insulin (HCC)- With CAD. Blood sugar is well controlled.     Follow-up: Return in about 4 months (around 02/03/2025).  Debby Molt, MD

## 2024-10-16 ENCOUNTER — Encounter (HOSPITAL_COMMUNITY): Payer: Self-pay

## 2024-10-18 ENCOUNTER — Other Ambulatory Visit: Payer: Self-pay | Admitting: Internal Medicine

## 2024-10-18 ENCOUNTER — Encounter (HOSPITAL_COMMUNITY)
Admission: RE | Admit: 2024-10-18 | Discharge: 2024-10-18 | Disposition: A | Discharge status: Home / Self Care | Source: Ambulatory Visit | Attending: Cardiology | Admitting: Cardiology

## 2024-10-18 DIAGNOSIS — I25119 Atherosclerotic heart disease of native coronary artery with unspecified angina pectoris: Secondary | ICD-10-CM

## 2024-10-18 DIAGNOSIS — R079 Chest pain, unspecified: Secondary | ICD-10-CM | POA: Insufficient documentation

## 2024-10-18 LAB — NM PET CT CARDIAC PERFUSION MULTI W/ABSOLUTE BLOODFLOW
MBFR: 2.29
Nuc Rest EF: 58 %
Nuc Stress EF: 69 %
Rest MBF: 0.99 ml/g/min
Rest Nuclear Isotope Dose: 20.4 mCi
ST Depression (mm): 0 mm
Stress MBF: 2.27 ml/g/min
Stress Nuclear Isotope Dose: 20.4 mCi
TID: 0.9

## 2024-10-18 MED ORDER — RUBIDIUM RB82 GENERATOR (RUBYFILL)
20.4000 | PACK | Freq: Once | INTRAVENOUS | Status: AC
  Administered 2024-10-18: 20.42 via INTRAVENOUS

## 2024-10-18 MED ORDER — REGADENOSON 0.4 MG/5ML IV SOLN
0.4000 mg | Freq: Once | INTRAVENOUS | Status: AC
  Administered 2024-10-18: 0.4 mg via INTRAVENOUS

## 2024-10-18 MED ORDER — REGADENOSON 0.4 MG/5ML IV SOLN
INTRAVENOUS | Status: AC
  Filled 2024-10-18: qty 5

## 2024-10-18 MED ORDER — RUBIDIUM RB82 GENERATOR (RUBYFILL)
20.4300 | PACK | Freq: Once | INTRAVENOUS | Status: AC
  Administered 2024-10-18: 20.43 via INTRAVENOUS

## 2024-10-19 ENCOUNTER — Ambulatory Visit: Payer: Self-pay | Admitting: Cardiology

## 2024-10-19 DIAGNOSIS — R002 Palpitations: Secondary | ICD-10-CM | POA: Diagnosis not present

## 2024-10-20 ENCOUNTER — Other Ambulatory Visit: Payer: Self-pay

## 2024-10-20 ENCOUNTER — Other Ambulatory Visit: Payer: Self-pay | Admitting: Internal Medicine

## 2024-10-20 ENCOUNTER — Other Ambulatory Visit (HOSPITAL_COMMUNITY): Payer: Self-pay

## 2024-10-20 DIAGNOSIS — I2510 Atherosclerotic heart disease of native coronary artery without angina pectoris: Secondary | ICD-10-CM

## 2024-10-20 MED ORDER — ASPIRIN 81 MG PO TBEC
81.0000 mg | DELAYED_RELEASE_TABLET | Freq: Every day | ORAL | 1 refills | Status: AC
  Filled 2024-10-20: qty 90, 90d supply, fill #0
  Filled 2024-12-18 – 2025-01-11 (×2): qty 90, 90d supply, fill #1

## 2024-10-23 DIAGNOSIS — H35372 Puckering of macula, left eye: Secondary | ICD-10-CM | POA: Diagnosis not present

## 2024-10-23 DIAGNOSIS — H3530 Unspecified macular degeneration: Secondary | ICD-10-CM | POA: Diagnosis not present

## 2024-10-23 DIAGNOSIS — H04121 Dry eye syndrome of right lacrimal gland: Secondary | ICD-10-CM | POA: Diagnosis not present

## 2024-10-23 DIAGNOSIS — H26493 Other secondary cataract, bilateral: Secondary | ICD-10-CM | POA: Diagnosis not present

## 2024-10-23 DIAGNOSIS — H43813 Vitreous degeneration, bilateral: Secondary | ICD-10-CM | POA: Diagnosis not present

## 2024-10-23 DIAGNOSIS — H401131 Primary open-angle glaucoma, bilateral, mild stage: Secondary | ICD-10-CM | POA: Diagnosis not present

## 2024-10-23 DIAGNOSIS — H5203 Hypermetropia, bilateral: Secondary | ICD-10-CM | POA: Diagnosis not present

## 2024-10-25 ENCOUNTER — Other Ambulatory Visit (HOSPITAL_COMMUNITY): Payer: Self-pay

## 2024-10-25 ENCOUNTER — Other Ambulatory Visit: Payer: Self-pay

## 2024-10-25 MED ORDER — NITROGLYCERIN 0.4 MG SL SUBL
0.4000 mg | SUBLINGUAL_TABLET | SUBLINGUAL | 3 refills | Status: AC | PRN
  Filled 2024-10-25: qty 25, 15d supply, fill #0

## 2024-10-30 DIAGNOSIS — R002 Palpitations: Secondary | ICD-10-CM

## 2024-10-31 ENCOUNTER — Other Ambulatory Visit: Payer: Self-pay | Admitting: Internal Medicine

## 2024-10-31 ENCOUNTER — Other Ambulatory Visit (HOSPITAL_COMMUNITY): Payer: Self-pay

## 2024-10-31 ENCOUNTER — Other Ambulatory Visit: Payer: Self-pay

## 2024-10-31 DIAGNOSIS — I25119 Atherosclerotic heart disease of native coronary artery with unspecified angina pectoris: Secondary | ICD-10-CM

## 2024-10-31 DIAGNOSIS — E785 Hyperlipidemia, unspecified: Secondary | ICD-10-CM

## 2024-10-31 MED ORDER — ROSUVASTATIN CALCIUM 20 MG PO TABS
20.0000 mg | ORAL_TABLET | Freq: Every day | ORAL | 0 refills | Status: AC
  Filled 2024-10-31: qty 90, 90d supply, fill #0

## 2024-11-14 ENCOUNTER — Other Ambulatory Visit: Payer: Self-pay | Admitting: Family

## 2024-11-14 ENCOUNTER — Ambulatory Visit (HOSPITAL_COMMUNITY)
Admission: RE | Admit: 2024-11-14 | Discharge: 2024-11-14 | Disposition: A | Source: Ambulatory Visit | Attending: Cardiology | Admitting: Cardiology

## 2024-11-14 ENCOUNTER — Encounter: Payer: Self-pay | Admitting: Internal Medicine

## 2024-11-14 DIAGNOSIS — R079 Chest pain, unspecified: Secondary | ICD-10-CM

## 2024-11-14 DIAGNOSIS — F5104 Psychophysiologic insomnia: Secondary | ICD-10-CM

## 2024-11-14 LAB — ECHOCARDIOGRAM COMPLETE
Area-P 1/2: 3.5 cm2
P 1/2 time: 572 ms
S' Lateral: 2.5 cm

## 2024-11-15 ENCOUNTER — Other Ambulatory Visit (HOSPITAL_COMMUNITY): Payer: Self-pay

## 2024-11-15 ENCOUNTER — Other Ambulatory Visit: Payer: Self-pay | Admitting: Internal Medicine

## 2024-11-15 DIAGNOSIS — I5032 Chronic diastolic (congestive) heart failure: Secondary | ICD-10-CM

## 2024-11-15 MED ORDER — COVID-19 MRNA VAC-TRIS(PFIZER) 30 MCG/0.3ML IM SUSY: 0.3000 mL | PREFILLED_SYRINGE | Freq: Once | INTRAMUSCULAR | 0 refills | Status: AC

## 2024-11-15 MED ORDER — ESZOPICLONE 2 MG PO TABS
2.0000 mg | ORAL_TABLET | Freq: Every evening | ORAL | 0 refills | Status: AC | PRN
  Filled 2024-11-15: qty 90, 90d supply, fill #0

## 2024-11-15 NOTE — Telephone Encounter (Signed)
 done

## 2024-11-27 ENCOUNTER — Encounter: Payer: Self-pay | Admitting: Internal Medicine

## 2024-11-27 ENCOUNTER — Other Ambulatory Visit: Payer: Self-pay | Admitting: Internal Medicine

## 2024-11-27 ENCOUNTER — Other Ambulatory Visit (HOSPITAL_COMMUNITY): Payer: Self-pay

## 2024-11-27 DIAGNOSIS — I2510 Atherosclerotic heart disease of native coronary artery without angina pectoris: Secondary | ICD-10-CM

## 2024-11-27 MED ORDER — METOPROLOL TARTRATE 25 MG PO TABS
37.5000 mg | ORAL_TABLET | Freq: Two times a day (BID) | ORAL | 0 refills | Status: AC
  Filled 2024-11-27: qty 270, 90d supply, fill #0

## 2024-11-28 ENCOUNTER — Other Ambulatory Visit (HOSPITAL_COMMUNITY): Payer: Self-pay

## 2024-11-28 ENCOUNTER — Other Ambulatory Visit: Payer: Self-pay

## 2024-12-01 ENCOUNTER — Other Ambulatory Visit: Payer: Self-pay | Admitting: Internal Medicine

## 2024-12-01 ENCOUNTER — Other Ambulatory Visit (HOSPITAL_COMMUNITY): Payer: Self-pay

## 2024-12-01 DIAGNOSIS — F3341 Major depressive disorder, recurrent, in partial remission: Secondary | ICD-10-CM

## 2024-12-01 MED ORDER — LAMOTRIGINE 100 MG PO TABS
100.0000 mg | ORAL_TABLET | Freq: Every day | ORAL | 0 refills | Status: DC
  Filled 2024-12-01: qty 90, 90d supply, fill #0

## 2024-12-05 ENCOUNTER — Other Ambulatory Visit: Payer: Self-pay | Admitting: Internal Medicine

## 2024-12-05 DIAGNOSIS — K219 Gastro-esophageal reflux disease without esophagitis: Secondary | ICD-10-CM

## 2024-12-05 DIAGNOSIS — I10 Essential (primary) hypertension: Secondary | ICD-10-CM

## 2024-12-05 DIAGNOSIS — I2510 Atherosclerotic heart disease of native coronary artery without angina pectoris: Secondary | ICD-10-CM

## 2024-12-08 ENCOUNTER — Other Ambulatory Visit (HOSPITAL_COMMUNITY): Payer: Self-pay

## 2024-12-08 MED ORDER — PREDNISOLONE ACETATE 1 % OP SUSP
1.0000 [drp] | Freq: Four times a day (QID) | OPHTHALMIC | 0 refills | Status: AC
  Filled 2024-12-08: qty 5, 25d supply, fill #0

## 2024-12-09 ENCOUNTER — Other Ambulatory Visit: Payer: Self-pay

## 2024-12-11 ENCOUNTER — Encounter (HOSPITAL_COMMUNITY): Payer: Self-pay

## 2024-12-11 ENCOUNTER — Other Ambulatory Visit (HOSPITAL_COMMUNITY): Payer: Self-pay

## 2024-12-11 MED ORDER — OLMESARTAN MEDOXOMIL 40 MG PO TABS
40.0000 mg | ORAL_TABLET | Freq: Every day | ORAL | 0 refills | Status: AC
  Filled 2024-12-11: qty 90, 90d supply, fill #0

## 2024-12-11 MED ORDER — PANTOPRAZOLE SODIUM 40 MG PO TBEC
40.0000 mg | DELAYED_RELEASE_TABLET | Freq: Every day | ORAL | 0 refills | Status: AC
  Filled 2024-12-11: qty 90, 90d supply, fill #0

## 2024-12-11 MED ORDER — FAMOTIDINE 20 MG PO TABS
20.0000 mg | ORAL_TABLET | Freq: Every day | ORAL | 0 refills | Status: AC
  Filled 2024-12-11: qty 90, 90d supply, fill #0

## 2024-12-12 ENCOUNTER — Other Ambulatory Visit: Payer: Self-pay | Admitting: Internal Medicine

## 2024-12-12 ENCOUNTER — Encounter (HOSPITAL_COMMUNITY): Payer: Self-pay

## 2024-12-12 ENCOUNTER — Other Ambulatory Visit (HOSPITAL_COMMUNITY): Payer: Self-pay

## 2024-12-12 ENCOUNTER — Other Ambulatory Visit: Payer: Self-pay

## 2024-12-12 DIAGNOSIS — F3341 Major depressive disorder, recurrent, in partial remission: Secondary | ICD-10-CM

## 2024-12-17 MED ORDER — LAMOTRIGINE 100 MG PO TABS
100.0000 mg | ORAL_TABLET | Freq: Every day | ORAL | 0 refills | Status: DC
  Filled 2024-12-17: qty 90, 90d supply, fill #0

## 2024-12-18 ENCOUNTER — Other Ambulatory Visit: Payer: Self-pay | Admitting: Internal Medicine

## 2024-12-18 ENCOUNTER — Other Ambulatory Visit (HOSPITAL_COMMUNITY): Payer: Self-pay

## 2024-12-18 DIAGNOSIS — N1831 Chronic kidney disease, stage 3a: Secondary | ICD-10-CM

## 2024-12-18 DIAGNOSIS — E118 Type 2 diabetes mellitus with unspecified complications: Secondary | ICD-10-CM

## 2024-12-19 ENCOUNTER — Other Ambulatory Visit (HOSPITAL_COMMUNITY): Payer: Self-pay

## 2024-12-20 ENCOUNTER — Other Ambulatory Visit (HOSPITAL_COMMUNITY): Payer: Self-pay

## 2024-12-20 ENCOUNTER — Other Ambulatory Visit: Payer: Self-pay

## 2024-12-20 MED ORDER — EMPAGLIFLOZIN 10 MG PO TABS
10.0000 mg | ORAL_TABLET | Freq: Every day | ORAL | 0 refills | Status: AC
  Filled 2024-12-20: qty 90, 90d supply, fill #0

## 2024-12-27 ENCOUNTER — Other Ambulatory Visit (HOSPITAL_COMMUNITY): Payer: Self-pay

## 2024-12-27 ENCOUNTER — Other Ambulatory Visit: Payer: Self-pay

## 2025-01-08 NOTE — Progress Notes (Unsigned)
 " Cardiology Office Note:    Date:  01/10/2025   ID:  Robert Mccall, DOB 04-Mar-1951, MRN [id number]  PCP:  Joshua Debby CROME, MD  Cardiologist:  Lonni CROME Nanas, MD  Electrophysiologist:  None   Referring MD: Joshua Debby CROME, MD   Chief Complaint  Patient presents with   Coronary Artery Disease    History of Present Illness:    Robert Mccall is a 74 y.o. male with a hx of CAD, chronic diastolic heart failure, hypertension, hyperlipidemia, TIA who presents for follow-up.  Previously followed with Dr. Alveta.  Coronary CTA in 2021 showed moderate disease in proximal to mid LAD, negative by FFR.  In 01/2022 LHC showed 90% mid LAD stenosis with 90% D2 stenosis, status post DES of LAD and PTCA of D2.  Stress PET 10/2024 showed normal perfusion, normal myocardial blood flow reserve, LVEF 58%.  Echocardiogram 11/14/2024 showed normal biventricular function, no significant valvular disease, dilated ascending aorta measuring 43 mm.  Zio patch x 14 days 10/2024 showed no significant arrhythmias.  Since last clinic visit, he reports he is doing well.  Still having some dyspnea but denies any chest pain. Denies any lightheadedness, syncope, lower extremity edema.  Reports occasional palpitations after eating.  He has not been exercising.  Denies any bleeding on DAPT.    Past Medical History:  Diagnosis Date   ADHD (attention deficit hyperactivity disorder)    Anxiety    Benign prostatic hypertrophy    Chronic lower back pain    surgery schedule for back 10/21/16   Cyst of right kidney    incidental Bosiak 1.3cm on R (MRI abd 09/2014), follows with uro   Depression    GERD (gastroesophageal reflux disease)    Glaucoma    Hard of hearing    History of migraine headaches    no meds   Hyperlipidemia    Hypertension    Menetrier's disease     Past Surgical History:  Procedure Laterality Date   COLONOSCOPY  09/2007   polyps/ Debrah   HYDROCELE EXCISION Right 1990   LEFT  HEART CATH AND CORONARY ANGIOGRAPHY N/A 01/07/2022   Procedure: LEFT HEART CATH AND CORONARY ANGIOGRAPHY;  Surgeon: Anner Alm ORN, MD;  Location: [facility] INVASIVE CV LAB;  Service: Cardiovascular;  Laterality: N/A;   LUMBAR DISC SURGERY  10/21/2016   ORCHIECTOMY Left 1970   WISDOM TOOTH EXTRACTION      Current Medications: Current Meds  Medication Sig   alfuzosin  (UROXATRAL ) 10 MG 24 hr tablet Take 1 tablet (10 mg total) by mouth daily with breakfast.   aspirin  EC 81 MG tablet Take 1 tablet (81 mg total) by mouth daily. Swallow whole.   Cholecalciferol (VITAMIN D ) 50 MCG (2000 UT) CAPS Take 1 capsule (2,000 Units total) by mouth daily.   clopidogrel  (PLAVIX ) 75 MG tablet Take 1 tablet (75 mg total) by mouth daily.   Docusate Sodium 100 MG capsule Take 100 mg by mouth 2 (two) times daily.   empagliflozin  (JARDIANCE ) 10 MG TABS tablet Take 1 tablet (10 mg total) by mouth daily before breakfast.   famotidine  (PEPCID ) 20 MG tablet Take 1 tablet (20 mg total) by mouth daily after supper   finasteride  (PROSCAR ) 5 MG tablet Take 1 tablet (5 mg total) by mouth daily.   ketoconazole  (NIZORAL ) 2 % shampoo Apply 1 Application topically 2 (two) times a week. Allow to sit on Scalp for about 30 seconds to 1 minute   lamoTRIgine  (LAMICTAL  XR) 100  MG 24 hour tablet Take 1 tablet (100 mg total) by mouth daily.   lamoTRIgine  (LAMICTAL ) 100 MG tablet Take 1 tablet (100 mg total) by mouth daily.   metoprolol  tartrate (LOPRESSOR ) 25 MG tablet Take 1.5 tablets (37.5 mg total) by mouth 2 (two) times daily.   Multiple Vitamins-Minerals (ICAPS AREDS 2 PO) Take by mouth.   olmesartan  (BENICAR ) 40 MG tablet Take 1 tablet (40 mg total) by mouth daily.   pantoprazole  (PROTONIX ) 40 MG tablet Take 1 tablet (40 mg total) by mouth daily. Take 30-60 min before first meal of the day   prednisoLONE  acetate (PRED FORTE ) 1 % ophthalmic suspension Place 1 drop into the left eye 4 (four) times daily.   Probiotic Product (PROBIOTIC-10  PO) Take 1 capsule by mouth daily.   rosuvastatin  (CRESTOR ) 20 MG tablet Take 1 tablet (20 mg total) by mouth daily.   vitamin B-12 (CYANOCOBALAMIN ) 1000 MCG tablet Take 1,000 mcg by mouth daily.     Allergies:   Patient has no known allergies.   Social History   Socioeconomic History   Marital status: Single    Spouse name: Not on file   Number of children: Not on file   Years of education: Not on file   Highest education level: Master's degree (e.g., MA, MS, MEng, MEd, MSW, MBA)  Occupational History   Occupation: production designer, theatre/television/film    Comment: [employer]  Tobacco Use   Smoking status: Never    Passive exposure: Never   Smokeless tobacco: Never  Vaping Use   Vaping status: Never Used  Substance and Sexual Activity   Alcohol use: Yes    Alcohol/week: 1.0 standard drink of alcohol    Types: 1 Glasses of wine per week    Comment: occ   Drug use: No   Sexual activity: Yes    Comment: working with replacement  Other Topics Concern   Not on file  Social History Narrative   Single   Social Drivers of Health   Tobacco Use: Low Risk (01/09/2025)   Patient History    Smoking Tobacco Use: Never    Smokeless Tobacco Use: Never    Passive Exposure: Never  Financial Resource Strain: Low Risk (09/27/2024)   Overall Financial Resource Strain (CARDIA)    Difficulty of Paying Living Expenses: Not hard at all  Food Insecurity: No Food Insecurity (09/27/2024)   Epic    Worried About Radiation Protection Practitioner of Food in the Last Year: Never true    Ran Out of Food in the Last Year: Never true  Transportation Needs: No Transportation Needs (09/27/2024)   Epic    Lack of Transportation (Medical): No    Lack of Transportation (Non-Medical): No  Physical Activity: Inactive (09/27/2024)   Exercise Vital Sign    Days of Exercise per Week: 0 days    Minutes of Exercise per Session: 0 min  Stress: No Stress Concern Present (09/27/2024)   [employer] of Occupational [employer] -  Occupational Stress Questionnaire    Feeling of Stress: Only a little  Recent Concern: Stress - Stress Concern Present (08/21/2024)   [employer] of Occupational Health - Occupational Stress Questionnaire    Feeling of Stress: To some extent  Social Connections: Socially Isolated (09/27/2024)   Social Connection and Isolation Panel    Frequency of Communication with Friends and Family: Never    Frequency of Social Gatherings with Friends and Family: Once a week    Attends Religious Services: Never    Active  Member of Clubs or Organizations: No    Attends Banker Meetings: Never    Marital Status: Divorced  Depression (PHQ2-9): Low Risk (09/27/2024)   Depression (PHQ2-9)    PHQ-2 Score: 0  Alcohol Screen: Low Risk (09/27/2024)   Alcohol Screen    Last Alcohol Screening Score (AUDIT): 1  Housing: Low Risk (09/27/2024)   Epic    Unable to Pay for Housing in the Last Year: No    Number of Times Moved in the Last Year: 0    Homeless in the Last Year: No  Utilities: Not At Risk (09/27/2024)   Epic    Threatened with loss of utilities: No  Health Literacy: Adequate Health Literacy (09/27/2024)   B1300 Health Literacy    Frequency of need for help with medical instructions: Never     Family History: The patient's family history includes Breast cancer in his maternal grandmother; Breast cancer (age of onset: 80) in his mother; COPD in his mother; Cancer in his father; Depression in his mother; Diabetes in his father; Heart disease in his mother; Macular degeneration in his father; Osteoarthritis in his mother. There is no history of Colon cancer, Esophageal cancer, Rectal cancer, or Stomach cancer.  ROS:   Please see the history of present illness.     All other systems reviewed and are negative.  EKGs/Labs/Other Studies Reviewed:    The following studies were reviewed today:   EKG:  EKG is not ordered today.    Recent Labs: 02/10/2024: TSH 1.86 09/11/2024: ALT  25; BUN 16; Creatinine, Ser 1.22; Hemoglobin 15.9; Platelets 268; Potassium 4.0; Sodium 138  Recent Lipid Panel    Component Value Date/Time   CHOL 112 02/10/2024 1538   CHOL 107 05/27/2020 1239   TRIG 164.0 (H) 02/10/2024 1538   HDL 42.00 02/10/2024 1538   HDL 31 (L) 05/27/2020 1239   CHOLHDL 3 02/10/2024 1538   VLDL 32.8 02/10/2024 1538   LDLCALC 37 02/10/2024 1538   LDLCALC 52 05/27/2020 1239   LDLDIRECT 76.0 11/29/2019 0901    Physical Exam:    VS:  BP 122/64 (BP Location: Left Arm, Patient Position: Sitting, Cuff Size: Normal)   Pulse 95   Ht 5' 4 (1.626 m)   Wt 173 lb 11.2 oz (78.8 kg)   SpO2 98%   BMI 29.82 kg/m     Wt Readings from Last 3 Encounters:  01/09/25 173 lb 11.2 oz (78.8 kg)  10/05/24 174 lb 6.4 oz (79.1 kg)  09/28/24 172 lb 9.6 oz (78.3 kg)     GEN:  Well nourished, well developed in no acute distress HEENT: Normal NECK: No JVD; No carotid bruits LYMPHATICS: No lymphadenopathy CARDIAC: RRR, no murmurs, rubs, gallops RESPIRATORY:  Clear to auscultation without rales, wheezing or rhonchi  ABDOMEN: Soft, non-tender, non-distended MUSCULOSKELETAL:  No edema; No deformity  SKIN: Warm and dry NEUROLOGIC:  Alert and oriented x 3 PSYCHIATRIC:  Normal affect   ASSESSMENT:    1. Coronary artery disease involving native coronary artery of native heart, unspecified whether angina present   2. Palpitations   3. Essential hypertension   4. Hyperlipidemia, unspecified hyperlipidemia type     PLAN:    CAD: In 01/2022 LHC showed 90% mid LAD stenosis with 90% D2 stenosis, status post DES of LAD and PTCA of D2.  He reported dyspnea on exertion and possible chest pain and underwent Stress PET 10/2024 which showed normal perfusion, normal myocardial blood flow reserve, LVEF 58%.  Echocardiogram 11/14/2024  showed normal biventricular function, no significant valvular disease, dilated ascending aorta measuring 43 mm.  - Continue aspirin  81 mg daily and Plavix  75 mg  daily - Continue metoprolol  37.5 mg twice daily - Continue rosuvastatin  20 mg daily  Palpitations:  Zio patch x 14 days 10/2024 showed no significant arrhythmias.  Hypertension: On olmesartan  40 mg daily and metoprolol  37.5 mg twice daily.  Appears controlled  Hyperlipidemia: On rosuvastatin  20 mg daily.  LDL 37 on 02/10/24  Dilated aorta: Echocardiogram 11/14/2024 showed normal biventricular function, no significant valvular disease, dilated ascending aorta measuring 43 mm.  Repeat echo in 1 year to monitor  RTC in 6 months   Medication Adjustments/Labs and Tests Ordered: Current medicines are reviewed at length with the patient today.  Concerns regarding medicines are outlined above.  No orders of the defined types were placed in this encounter.  No orders of the defined types were placed in this encounter.   Patient Instructions  Medication Instructions:  Your physician recommends that you continue on your current medications as directed. Please refer to the Current Medication list given to you today.  *If you need a refill on your cardiac medications before your next appointment, please call your pharmacy*  Lab Work: None ordered If you have labs (blood work) drawn today and your tests are completely normal, you will receive your results only by: MyChart Message (if you have MyChart) OR A paper copy in the mail If you have any lab test that is abnormal or we need to change your treatment, we will call you to review the results.  Testing/Procedures: None ordered  Follow-Up: At [facility], you and your health needs are our priority.  As part of our continuing mission to provide you with exceptional heart care, our providers are all part of one team.  This team includes your primary Cardiologist (physician) and Advanced Practice Providers or APPs (Physician Assistants and Nurse Practitioners) who all work together to provide you with the care you need, when you need  it.  Your next appointment:   6 month(s)  Provider:   Lonni LITTIE Nanas, MD    We recommend signing up for the patient portal called MyChart.  Sign up information is provided on this After Visit Summary.  MyChart is used to connect with patients for Virtual Visits (Telemedicine).  Patients are able to view lab/test results, encounter notes, upcoming appointments, etc.  Non-urgent messages can be sent to your provider as well.   To learn more about what you can do with MyChart, go to [email or web address].               Signed, Lonni LITTIE Nanas, MD  01/10/2025 8:47 AM    [facility] "

## 2025-01-09 ENCOUNTER — Encounter: Payer: Self-pay | Admitting: Cardiology

## 2025-01-09 ENCOUNTER — Ambulatory Visit: Admitting: Cardiology

## 2025-01-09 VITALS — BP 122/64 | HR 95 | Ht 64.0 in | Wt 173.7 lb

## 2025-01-09 DIAGNOSIS — R002 Palpitations: Secondary | ICD-10-CM

## 2025-01-09 DIAGNOSIS — E785 Hyperlipidemia, unspecified: Secondary | ICD-10-CM | POA: Diagnosis not present

## 2025-01-09 DIAGNOSIS — I10 Essential (primary) hypertension: Secondary | ICD-10-CM

## 2025-01-09 DIAGNOSIS — I2510 Atherosclerotic heart disease of native coronary artery without angina pectoris: Secondary | ICD-10-CM | POA: Diagnosis not present

## 2025-01-09 NOTE — Patient Instructions (Signed)
 Medication Instructions:  Your physician recommends that you continue on your current medications as directed. Please refer to the Current Medication list given to you today.  *If you need a refill on your cardiac medications before your next appointment, please call your pharmacy*  Lab Work: None ordered If you have labs (blood work) drawn today and your tests are completely normal, you will receive your results only by: MyChart Message (if you have MyChart) OR A paper copy in the mail If you have any lab test that is abnormal or we need to change your treatment, we will call you to review the results.  Testing/Procedures: None ordered  Follow-Up: At [facility], you and your health needs are our priority.  As part of our continuing mission to provide you with exceptional heart care, our providers are all part of one team.  This team includes your primary Cardiologist (physician) and Advanced Practice Providers or APPs (Physician Assistants and Nurse Practitioners) who all work together to provide you with the care you need, when you need it.  Your next appointment:   6 month(s)  Provider:   Lonni LITTIE Nanas, MD    We recommend signing up for the patient portal called MyChart.  Sign up information is provided on this After Visit Summary.  MyChart is used to connect with patients for Virtual Visits (Telemedicine).  Patients are able to view lab/test results, encounter notes, upcoming appointments, etc.  Non-urgent messages can be sent to your provider as well.   To learn more about what you can do with MyChart, go to [email or web address].

## 2025-01-11 ENCOUNTER — Encounter: Payer: Self-pay | Admitting: Internal Medicine

## 2025-01-12 ENCOUNTER — Other Ambulatory Visit: Payer: Self-pay | Admitting: Internal Medicine

## 2025-01-12 ENCOUNTER — Other Ambulatory Visit (HOSPITAL_BASED_OUTPATIENT_CLINIC_OR_DEPARTMENT_OTHER): Payer: Self-pay

## 2025-01-12 ENCOUNTER — Other Ambulatory Visit: Payer: Self-pay

## 2025-01-12 ENCOUNTER — Other Ambulatory Visit (HOSPITAL_COMMUNITY): Payer: Self-pay

## 2025-01-12 DIAGNOSIS — F3341 Major depressive disorder, recurrent, in partial remission: Secondary | ICD-10-CM

## 2025-01-12 MED ORDER — LAMOTRIGINE ER 200 MG PO TB24
1.0000 | ORAL_TABLET | Freq: Every day | ORAL | 0 refills | Status: AC
  Filled 2025-01-12: qty 90, 90d supply, fill #0

## 2025-01-22 ENCOUNTER — Ambulatory Visit: Admitting: Internal Medicine

## 2025-02-12 ENCOUNTER — Ambulatory Visit: Admitting: Internal Medicine

## 2025-09-11 ENCOUNTER — Inpatient Hospital Stay

## 2025-09-21 ENCOUNTER — Inpatient Hospital Stay: Admitting: Hematology and Oncology

## 2025-10-09 ENCOUNTER — Encounter: Admitting: Internal Medicine

## 2025-10-09 ENCOUNTER — Ambulatory Visit
